# Patient Record
Sex: Female | Born: 1945 | Race: White | Hispanic: No | Marital: Married | Smoking: Never smoker
Health system: Southern US, Community
[De-identification: ages and names within clinical notes are randomized; demographics above are authoritative.]

## PROBLEM LIST (undated history)

## (undated) DIAGNOSIS — I10 Essential (primary) hypertension: Secondary | ICD-10-CM

## (undated) DIAGNOSIS — C801 Malignant (primary) neoplasm, unspecified: Secondary | ICD-10-CM

## (undated) DIAGNOSIS — N390 Urinary tract infection, site not specified: Secondary | ICD-10-CM

## (undated) DIAGNOSIS — K5190 Ulcerative colitis, unspecified, without complications: Secondary | ICD-10-CM

## (undated) DIAGNOSIS — F329 Major depressive disorder, single episode, unspecified: Secondary | ICD-10-CM

## (undated) DIAGNOSIS — N9089 Other specified noninflammatory disorders of vulva and perineum: Secondary | ICD-10-CM

## (undated) DIAGNOSIS — B977 Papillomavirus as the cause of diseases classified elsewhere: Secondary | ICD-10-CM

## (undated) DIAGNOSIS — F32A Depression, unspecified: Secondary | ICD-10-CM

## (undated) DIAGNOSIS — F419 Anxiety disorder, unspecified: Secondary | ICD-10-CM

## (undated) DIAGNOSIS — K219 Gastro-esophageal reflux disease without esophagitis: Secondary | ICD-10-CM

## (undated) DIAGNOSIS — A0472 Enterocolitis due to Clostridium difficile, not specified as recurrent: Secondary | ICD-10-CM

## (undated) DIAGNOSIS — E785 Hyperlipidemia, unspecified: Secondary | ICD-10-CM

## (undated) HISTORY — PX: BREAST BIOPSY: SHX20

## (undated) HISTORY — DX: Hyperlipidemia, unspecified: E78.5

## (undated) HISTORY — PX: EYE SURGERY: SHX253

## (undated) HISTORY — DX: Ulcerative colitis, unspecified, without complications: K51.90

## (undated) HISTORY — PX: AUGMENTATION MAMMAPLASTY: SUR837

## (undated) HISTORY — PX: BREAST EXCISIONAL BIOPSY: SUR124

## (undated) HISTORY — DX: Enterocolitis due to Clostridium difficile, not specified as recurrent: A04.72

## (undated) HISTORY — PX: BREAST SURGERY: SHX581

## (undated) HISTORY — PX: ANTERIOR CERVICAL DECOMP/DISCECTOMY FUSION: SHX1161

---

## 1990-10-02 HISTORY — PX: ABDOMINAL HYSTERECTOMY: SHX81

## 1999-03-08 ENCOUNTER — Ambulatory Visit (HOSPITAL_COMMUNITY): Admission: RE | Admit: 1999-03-08 | Discharge: 1999-03-08 | Payer: Self-pay | Admitting: Gastroenterology

## 2001-03-29 ENCOUNTER — Ambulatory Visit (HOSPITAL_COMMUNITY): Admission: RE | Admit: 2001-03-29 | Discharge: 2001-03-29 | Payer: Self-pay | Admitting: Family Medicine

## 2001-04-15 ENCOUNTER — Other Ambulatory Visit: Admission: RE | Admit: 2001-04-15 | Discharge: 2001-04-15 | Payer: Self-pay | Admitting: Gynecology

## 2002-01-06 ENCOUNTER — Encounter: Admission: RE | Admit: 2002-01-06 | Discharge: 2002-01-06 | Payer: Self-pay | Admitting: Family Medicine

## 2002-01-06 ENCOUNTER — Encounter: Payer: Self-pay | Admitting: Family Medicine

## 2002-04-21 ENCOUNTER — Other Ambulatory Visit: Admission: RE | Admit: 2002-04-21 | Discharge: 2002-04-21 | Payer: Self-pay | Admitting: Gynecology

## 2002-09-08 ENCOUNTER — Encounter: Payer: Self-pay | Admitting: Family Medicine

## 2002-09-08 ENCOUNTER — Ambulatory Visit (HOSPITAL_COMMUNITY): Admission: RE | Admit: 2002-09-08 | Discharge: 2002-09-08 | Payer: Self-pay | Admitting: Family Medicine

## 2002-10-09 ENCOUNTER — Encounter: Payer: Self-pay | Admitting: Neurology

## 2002-10-09 ENCOUNTER — Ambulatory Visit (HOSPITAL_COMMUNITY): Admission: RE | Admit: 2002-10-09 | Discharge: 2002-10-09 | Payer: Self-pay | Admitting: Neurology

## 2002-11-19 ENCOUNTER — Encounter: Payer: Self-pay | Admitting: Gynecology

## 2002-11-19 ENCOUNTER — Encounter: Admission: RE | Admit: 2002-11-19 | Discharge: 2002-11-19 | Payer: Self-pay | Admitting: Gynecology

## 2002-12-30 ENCOUNTER — Encounter: Admission: RE | Admit: 2002-12-30 | Discharge: 2002-12-30 | Payer: Self-pay | Admitting: Gynecology

## 2002-12-30 ENCOUNTER — Encounter: Payer: Self-pay | Admitting: Gynecology

## 2003-04-23 ENCOUNTER — Other Ambulatory Visit: Admission: RE | Admit: 2003-04-23 | Discharge: 2003-04-23 | Payer: Self-pay | Admitting: Gynecology

## 2003-05-20 ENCOUNTER — Encounter: Payer: Self-pay | Admitting: Gynecology

## 2003-05-20 ENCOUNTER — Encounter: Admission: RE | Admit: 2003-05-20 | Discharge: 2003-05-20 | Payer: Self-pay | Admitting: Gynecology

## 2004-01-22 ENCOUNTER — Encounter: Admission: RE | Admit: 2004-01-22 | Discharge: 2004-01-22 | Payer: Self-pay | Admitting: Family Medicine

## 2004-04-28 ENCOUNTER — Other Ambulatory Visit: Admission: RE | Admit: 2004-04-28 | Discharge: 2004-04-28 | Payer: Self-pay | Admitting: Gynecology

## 2004-05-20 ENCOUNTER — Encounter: Admission: RE | Admit: 2004-05-20 | Discharge: 2004-05-20 | Payer: Self-pay | Admitting: Gynecology

## 2005-05-02 ENCOUNTER — Other Ambulatory Visit: Admission: RE | Admit: 2005-05-02 | Discharge: 2005-05-02 | Payer: Self-pay | Admitting: Gynecology

## 2005-05-22 ENCOUNTER — Encounter: Admission: RE | Admit: 2005-05-22 | Discharge: 2005-05-22 | Payer: Self-pay | Admitting: Gynecology

## 2005-06-15 ENCOUNTER — Encounter: Admission: RE | Admit: 2005-06-15 | Discharge: 2005-06-15 | Payer: Self-pay | Admitting: Gynecology

## 2006-05-03 ENCOUNTER — Other Ambulatory Visit: Admission: RE | Admit: 2006-05-03 | Discharge: 2006-05-03 | Payer: Self-pay | Admitting: Gynecology

## 2006-05-31 ENCOUNTER — Encounter: Admission: RE | Admit: 2006-05-31 | Discharge: 2006-05-31 | Payer: Self-pay | Admitting: Gynecology

## 2007-05-09 ENCOUNTER — Other Ambulatory Visit: Admission: RE | Admit: 2007-05-09 | Discharge: 2007-05-09 | Payer: Self-pay | Admitting: Gynecology

## 2007-06-06 ENCOUNTER — Encounter: Admission: RE | Admit: 2007-06-06 | Discharge: 2007-06-06 | Payer: Self-pay | Admitting: Gynecology

## 2007-12-04 ENCOUNTER — Encounter: Admission: RE | Admit: 2007-12-04 | Discharge: 2007-12-04 | Payer: Self-pay | Admitting: Family Medicine

## 2008-06-09 ENCOUNTER — Encounter: Admission: RE | Admit: 2008-06-09 | Discharge: 2008-06-09 | Payer: Self-pay | Admitting: Gynecology

## 2008-12-15 ENCOUNTER — Encounter: Admission: RE | Admit: 2008-12-15 | Discharge: 2008-12-15 | Payer: Self-pay | Admitting: Family Medicine

## 2009-01-18 ENCOUNTER — Ambulatory Visit (HOSPITAL_COMMUNITY): Admission: RE | Admit: 2009-01-18 | Discharge: 2009-01-18 | Payer: Self-pay | Admitting: Neurosurgery

## 2009-03-11 ENCOUNTER — Encounter: Admission: RE | Admit: 2009-03-11 | Discharge: 2009-03-11 | Payer: Self-pay | Admitting: Neurological Surgery

## 2009-07-13 ENCOUNTER — Encounter: Admission: RE | Admit: 2009-07-13 | Discharge: 2009-07-13 | Payer: Self-pay | Admitting: Gynecology

## 2009-10-02 HISTORY — PX: BREAST ENHANCEMENT SURGERY: SHX7

## 2009-10-28 ENCOUNTER — Encounter: Admission: RE | Admit: 2009-10-28 | Discharge: 2009-10-28 | Payer: Self-pay | Admitting: Gynecology

## 2009-11-10 ENCOUNTER — Encounter: Admission: RE | Admit: 2009-11-10 | Discharge: 2009-11-10 | Payer: Self-pay | Admitting: Neurosurgery

## 2009-12-29 ENCOUNTER — Encounter: Admission: RE | Admit: 2009-12-29 | Discharge: 2009-12-29 | Payer: Self-pay | Admitting: Surgery

## 2010-04-28 ENCOUNTER — Encounter: Admission: RE | Admit: 2010-04-28 | Discharge: 2010-04-28 | Payer: Self-pay | Admitting: Neurosurgery

## 2010-12-08 ENCOUNTER — Other Ambulatory Visit: Payer: Self-pay | Admitting: Gynecology

## 2010-12-08 DIAGNOSIS — Z1231 Encounter for screening mammogram for malignant neoplasm of breast: Secondary | ICD-10-CM

## 2011-01-02 ENCOUNTER — Ambulatory Visit
Admission: RE | Admit: 2011-01-02 | Discharge: 2011-01-02 | Disposition: A | Discharge status: Home / Self Care | Payer: Self-pay | Source: Ambulatory Visit | Attending: Gynecology | Admitting: Gynecology

## 2011-01-02 DIAGNOSIS — Z1231 Encounter for screening mammogram for malignant neoplasm of breast: Secondary | ICD-10-CM

## 2011-01-03 ENCOUNTER — Other Ambulatory Visit: Payer: Self-pay | Admitting: Gynecology

## 2011-01-03 ENCOUNTER — Other Ambulatory Visit: Payer: Self-pay | Admitting: Neurosurgery

## 2011-01-03 DIAGNOSIS — M542 Cervicalgia: Secondary | ICD-10-CM

## 2011-01-03 DIAGNOSIS — R928 Other abnormal and inconclusive findings on diagnostic imaging of breast: Secondary | ICD-10-CM

## 2011-01-05 ENCOUNTER — Ambulatory Visit
Admission: RE | Admit: 2011-01-05 | Discharge: 2011-01-05 | Disposition: A | Discharge status: Home / Self Care | Payer: BC Managed Care – PPO | Source: Ambulatory Visit | Attending: Gynecology | Admitting: Gynecology

## 2011-01-05 DIAGNOSIS — R928 Other abnormal and inconclusive findings on diagnostic imaging of breast: Secondary | ICD-10-CM

## 2011-01-11 LAB — CBC
HCT: 40.9 % (ref 36.0–46.0)
MCHC: 33.6 g/dL (ref 30.0–36.0)
MCV: 87.6 fL (ref 78.0–100.0)
RBC: 4.67 MIL/uL (ref 3.87–5.11)
WBC: 6.9 10*3/uL (ref 4.0–10.5)

## 2011-01-11 LAB — DIFFERENTIAL
Basophils Relative: 1 % (ref 0–1)
Eosinophils Absolute: 0.2 10*3/uL (ref 0.0–0.7)
Eosinophils Relative: 2 % (ref 0–5)
Lymphs Abs: 1.6 10*3/uL (ref 0.7–4.0)
Monocytes Relative: 9 % (ref 3–12)

## 2011-01-11 LAB — BASIC METABOLIC PANEL
BUN: 9 mg/dL (ref 6–23)
CO2: 28 mEq/L (ref 19–32)
Chloride: 106 mEq/L (ref 96–112)
Creatinine, Ser: 0.9 mg/dL (ref 0.4–1.2)
GFR calc Af Amer: >60 mL/min (ref >60)
Potassium: 4.7 mEq/L (ref 3.5–5.1)

## 2011-01-18 ENCOUNTER — Ambulatory Visit
Admission: RE | Admit: 2011-01-18 | Discharge: 2011-01-18 | Disposition: A | Discharge status: Home / Self Care | Payer: BC Managed Care – PPO | Source: Ambulatory Visit | Attending: Neurosurgery | Admitting: Neurosurgery

## 2011-01-18 DIAGNOSIS — M542 Cervicalgia: Secondary | ICD-10-CM

## 2011-01-20 ENCOUNTER — Other Ambulatory Visit: Payer: Self-pay | Admitting: Surgery

## 2011-01-20 DIAGNOSIS — R921 Mammographic calcification found on diagnostic imaging of breast: Secondary | ICD-10-CM

## 2011-02-01 ENCOUNTER — Encounter (HOSPITAL_BASED_OUTPATIENT_CLINIC_OR_DEPARTMENT_OTHER)
Admission: RE | Admit: 2011-02-01 | Discharge: 2011-02-01 | Disposition: A | Discharge status: Home / Self Care | Payer: BC Managed Care – PPO | Source: Ambulatory Visit | Attending: Surgery | Admitting: Surgery

## 2011-02-03 ENCOUNTER — Ambulatory Visit
Admission: RE | Admit: 2011-02-03 | Discharge: 2011-02-03 | Disposition: A | Discharge status: Home / Self Care | Payer: BC Managed Care – PPO | Source: Ambulatory Visit | Attending: Surgery | Admitting: Surgery

## 2011-02-03 ENCOUNTER — Other Ambulatory Visit: Payer: Self-pay | Admitting: Surgery

## 2011-02-03 ENCOUNTER — Ambulatory Visit (HOSPITAL_BASED_OUTPATIENT_CLINIC_OR_DEPARTMENT_OTHER)
Admission: RE | Admit: 2011-02-03 | Discharge: 2011-02-03 | Disposition: A | Discharge status: Home / Self Care | Payer: BC Managed Care – PPO | Source: Ambulatory Visit | Attending: Surgery | Admitting: Surgery

## 2011-02-03 DIAGNOSIS — F411 Generalized anxiety disorder: Secondary | ICD-10-CM | POA: Insufficient documentation

## 2011-02-03 DIAGNOSIS — R921 Mammographic calcification found on diagnostic imaging of breast: Secondary | ICD-10-CM

## 2011-02-03 DIAGNOSIS — Z01812 Encounter for preprocedural laboratory examination: Secondary | ICD-10-CM | POA: Insufficient documentation

## 2011-02-03 DIAGNOSIS — R928 Other abnormal and inconclusive findings on diagnostic imaging of breast: Secondary | ICD-10-CM | POA: Insufficient documentation

## 2011-02-03 LAB — POCT HEMOGLOBIN-HEMACUE: Hemoglobin: 14.1 g/dL (ref 12.0–15.0)

## 2011-02-06 NOTE — Op Note (Signed)
[name], Allison Pierce                ACCOUNT NO.:  [id number]  MEDICAL RECORD NO.:  [id number]            PATIENT TYPE:  LOCATION:                                 FACILITY:  PHYSICIAN:  Currie Paris, M.D.   DATE OF BIRTH:  DATE OF PROCEDURE:  02/03/2011 DATE OF DISCHARGE:                              OPERATIVE REPORT   PREOPERATIVE DIAGNOSIS:  Left breast calcifications, upper outer quadrant.  POSTOPERATIVE DIAGNOSIS:  Left breast calcifications, upper outer quadrant.  PROCEDURE:  Needle-guided excision, left breast calcifications.  SURGEON:  Currie Paris, MD  ANESTHESIA:  General.  CLINICAL HISTORY:  This is a 65 year old lady who had some calcifications seen on recent mammogram in the upper outer quadrant. There is no definite palpable mass.  After discussion with the patient, we elected to proceed to a needle-guided excision.  She is not a candidate for a core biopsy because of the location and the fact of implants being very close.  The patient was seen in the holding area, and she had no further questions.  I initialed the left breast as the operative side and reviewed the wire localization films.  The patient was taken to the operating room, and after satisfactory general anesthesia had been obtained, the left breast was prepped and draped and the time-out was done.  The guidewire entered laterally and tracked medially.  I made a curvilinear incision directly over the guidewire tract.  The calcifications appeared to be about 2.5 cm from the guidewire entry site as the wire seemed to go about another 2 cm distal to the calcifications.  There seemed to be primarily somewhat superior to the guidewire.  Once incision made, I raised a little bit of the skin flap superiorly and inferiorly and then laterally, and I was able to grasp the guidewire tract with an Allis clamp and take a cylinder of tissue around the guidewire taking a little bit more  superiorly than inferiorly.  I basically got down to the muscle and took the fascia off so my deep margin included fascia.  I got beyond the tip of the guidewire, which is almost impacting on the muscle as there was only about a 1.5 cm of tissue between skin and muscle.  I oriented the specimen with some ink and sutures, and discussed mammogram, which showed the calcifications apparently in the specimen along the superior part.  I reinspected the wound and everything appeared to be dry.  Superiorly, there was almost no residual tissue until I got about 1.5 cm to 2 cm above the incision where there may have been a little bit of residual breast, but I basically had very thin skin flap down to muscle for most of the area above superiorly.  Once everything was dry, I went ahead and put some 3-0 Vicryls followed by 4-0 Monocryl subcuticular and Dermabond to close.  The patient tolerated the procedure well.  There were no complications. All counts were correct.     Currie Paris, M.D.     CJS/MEDQ  D:  02/03/2011  T:  02/04/2011  Job:  [id number]  cc:  Cam Hai, C.N.M. [facility]  Electronically Signed by Cyndia Bent M.D. on 02/06/2011 08:50:46 AM

## 2011-02-14 NOTE — Op Note (Signed)
Allison Pierce, Allison Pierce                ACCOUNT NO.:  [id number]   MEDICAL RECORD NO.:  [id number]          PATIENT TYPE:  OIB   LOCATION:  3526                         FACILITY:  [facility]   PHYSICIAN:  Kathaleen Maser. Pool, M.D.    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   PROCEDURE NAME:  C4-5 and C5-6 anterior cervical diskectomy and fusion  with allograft and plating.   SURGEON:  Kathaleen Maser. Pool, MD   ASSISTANT:  Donalee Citrin, MD   ANESTHESIA:  General orotracheal.   INDICATION:  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  Workup demonstrates evidence of spondylosis  with marked foraminal stenosis at C4-5 and C5-6.  The patient presents  now for two-level anterior cervical diskectomy and fusion with allograft  and plating in hopes of improving her symptoms.   The patient was brought to the operating room and placed on table in  supine position.  After adequate level of anesthesia was achieved, the  patient was placed supine with the neck slightly extended and held in  place with halter traction.  The patient's anterior cervical region was  prepped and draped sterilely.  A #10 blade was used to make a linear  skin incision overlying the C5 vertebral level.  This was carried down  sharply to the platysma.  Platysma was then divided vertically and  dissection proceeded on medial border of the sternomastoid muscle and  carotid sheath.  Trachea and esophagus were mobilized, tracked towards  the left.  Prevertebral fascia was stripped off the inner spinal column.  Longus colli muscle was elevated bilaterally using  electrocautery.  Deep  self-retaining tractor was placed.  Intraoperative fluoroscopy views in  C4-5 and C5-6 levels were confirmed.  Disk space at both levels then  incised with 15 blade in a rectangular fashion.  Wide disk space clean-  out was achieved using pituitary rongeurs, forward and backward Karlin  curettes, Kerrison rongeurs, and high-speed drill -speed drill.  Elements of the disk were removed down to the level of the posterior  annulus.  Starting first at C4-5, remaining aspects of annulus  osteophytes removed using high-speed drill down to the level of the  longitudinal ligament.  The posterior longitudinal ligament was then  elevated and resected in piecemeal fashion using Kerrison rongeurs.  The  underlying thecal sac was then identified.  Wide central decompression  was then performed undercutting the bodies of C4 and C5.  Decompression  was then proceeded out to the edges of the neural foramen.  Wide  anterior foraminotomies were then performed along the course of the  exiting nerve roots bilaterally.  At this point, a very thorough  decompression was achieved.  There was no injury to thecal sac or  nerve  roots.  The procedure was then repeated at C5-6 again without  complication.  Wound was then irrigated with antibiotic solution.  Gelfoam was placed topically.  Hemostasis found to be good.  A 6-mm  Cornerstone allograft was impacted into place at C4-5 and a 7-mm graft  was placed at C5-6.  A 42-mm Atlantis anterior cervical plate was then  placed over the C4, C5, and C6 levels.  This attached under fluoroscopic  guidance using 13-mm variable angle screws to each at all 3 levels.  All  screws given final tightening and found to be solidly within bone.  Locking screws engaged at all three levels.  Final images revealed good  position of the bone grafts, hardware at proper operative level, with  normal alignment of spine.  Wound was then inspected.  Hemostasis found to  be good.  The wound was  irrigated one final time then closed in a typical fashion.  Steri-Strips  and sterile dressings were applied.  There were no complications.  The  patient tolerated the procedure well, and she returns to the recovery  room postoperatively.           ______________________________  Kathaleen Maser Pool, M.D.     HAP/MEDQ  D:  01/18/2009  T:  01/19/2009  Job:  [id number]

## 2011-03-20 ENCOUNTER — Encounter (INDEPENDENT_AMBULATORY_CARE_PROVIDER_SITE_OTHER): Payer: Self-pay | Admitting: Surgery

## 2011-05-30 ENCOUNTER — Other Ambulatory Visit: Payer: Self-pay | Admitting: Gynecology

## 2011-10-26 ENCOUNTER — Other Ambulatory Visit: Payer: Self-pay | Admitting: Gynecology

## 2011-12-19 ENCOUNTER — Other Ambulatory Visit: Payer: Self-pay | Admitting: Gynecology

## 2011-12-19 DIAGNOSIS — Z1231 Encounter for screening mammogram for malignant neoplasm of breast: Secondary | ICD-10-CM

## 2012-01-03 ENCOUNTER — Ambulatory Visit
Admission: RE | Admit: 2012-01-03 | Discharge: 2012-01-03 | Disposition: A | Discharge status: Home / Self Care | Payer: BC Managed Care – PPO | Source: Ambulatory Visit | Attending: Gynecology | Admitting: Gynecology

## 2012-01-03 DIAGNOSIS — Z1231 Encounter for screening mammogram for malignant neoplasm of breast: Secondary | ICD-10-CM

## 2012-05-30 ENCOUNTER — Other Ambulatory Visit: Payer: Self-pay | Admitting: Gynecology

## 2012-09-05 ENCOUNTER — Encounter (HOSPITAL_BASED_OUTPATIENT_CLINIC_OR_DEPARTMENT_OTHER): Payer: Self-pay | Admitting: *Deleted

## 2012-09-05 NOTE — Progress Notes (Signed)
NPO AFTER MN. ARRIVE AT 1610. NEEDS ISTAT AND EKG. WILL TAKE WELLBUTRIN AND LIPITOR AM OF SURG W/ SIP OF WATER.

## 2012-09-10 ENCOUNTER — Ambulatory Visit (HOSPITAL_BASED_OUTPATIENT_CLINIC_OR_DEPARTMENT_OTHER)
Admission: RE | Admit: 2012-09-10 | Discharge: 2012-09-10 | Disposition: A | Discharge status: Home / Self Care | Payer: BC Managed Care – PPO | Source: Ambulatory Visit | Attending: Gynecology | Admitting: Gynecology

## 2012-09-10 ENCOUNTER — Encounter (HOSPITAL_BASED_OUTPATIENT_CLINIC_OR_DEPARTMENT_OTHER): Payer: Self-pay | Admitting: Anesthesiology

## 2012-09-10 ENCOUNTER — Encounter (HOSPITAL_BASED_OUTPATIENT_CLINIC_OR_DEPARTMENT_OTHER): Payer: Self-pay | Admitting: *Deleted

## 2012-09-10 ENCOUNTER — Other Ambulatory Visit: Payer: Self-pay

## 2012-09-10 ENCOUNTER — Encounter (HOSPITAL_BASED_OUTPATIENT_CLINIC_OR_DEPARTMENT_OTHER)
Admission: RE | Disposition: A | Discharge status: Home / Self Care | Payer: Self-pay | Source: Ambulatory Visit | Attending: Gynecology

## 2012-09-10 ENCOUNTER — Ambulatory Visit (HOSPITAL_BASED_OUTPATIENT_CLINIC_OR_DEPARTMENT_OTHER): Payer: BC Managed Care – PPO | Admitting: Anesthesiology

## 2012-09-10 DIAGNOSIS — A630 Anogenital (venereal) warts: Secondary | ICD-10-CM | POA: Insufficient documentation

## 2012-09-10 DIAGNOSIS — I10 Essential (primary) hypertension: Secondary | ICD-10-CM | POA: Insufficient documentation

## 2012-09-10 DIAGNOSIS — L819 Disorder of pigmentation, unspecified: Secondary | ICD-10-CM | POA: Insufficient documentation

## 2012-09-10 HISTORY — DX: Essential (primary) hypertension: I10

## 2012-09-10 HISTORY — DX: Anxiety disorder, unspecified: F41.9

## 2012-09-10 HISTORY — DX: Major depressive disorder, single episode, unspecified: F32.9

## 2012-09-10 HISTORY — DX: Gastro-esophageal reflux disease without esophagitis: K21.9

## 2012-09-10 HISTORY — DX: Depression, unspecified: F32.A

## 2012-09-10 HISTORY — PX: VULVAR LESION REMOVAL: SHX5391

## 2012-09-10 HISTORY — DX: Other specified noninflammatory disorders of vulva and perineum: N90.89

## 2012-09-10 LAB — POCT I-STAT 4, (NA,K, GLUC, HGB,HCT)
HCT: 41 % (ref 36.0–46.0)
Potassium: 3.7 mEq/L (ref 3.5–5.1)
Sodium: 143 mEq/L (ref 135–145)

## 2012-09-10 SURGERY — VULVAR LESION
Anesthesia: Monitor Anesthesia Care | Site: Vulva | Wound class: Clean Contaminated

## 2012-09-10 MED ORDER — LACTATED RINGERS IV SOLN
INTRAVENOUS | Status: DC
  Administered 2012-09-10 (×2): via INTRAVENOUS
  Filled 2012-09-10: qty 1000

## 2012-09-10 MED ORDER — LACTATED RINGERS IV SOLN
INTRAVENOUS | Status: DC
  Administered 2012-09-10: 08:00:00 via INTRAVENOUS
  Filled 2012-09-10: qty 1000

## 2012-09-10 MED ORDER — LIDOCAINE HCL 1 % IJ SOLN
INTRAMUSCULAR | Status: DC | PRN
  Administered 2012-09-10: 8 mL

## 2012-09-10 MED ORDER — MEPERIDINE HCL 25 MG/ML IJ SOLN
6.2500 mg | INTRAMUSCULAR | Status: DC | PRN
  Filled 2012-09-10: qty 1

## 2012-09-10 MED ORDER — PROPOFOL 10 MG/ML IV EMUL
INTRAVENOUS | Status: DC | PRN
  Administered 2012-09-10: 75 ug/kg/min via INTRAVENOUS

## 2012-09-10 MED ORDER — MIDAZOLAM HCL 5 MG/5ML IJ SOLN
INTRAMUSCULAR | Status: DC | PRN
  Administered 2012-09-10: 2 mg via INTRAVENOUS

## 2012-09-10 MED ORDER — FENTANYL CITRATE 0.05 MG/ML IJ SOLN
25.0000 ug | INTRAMUSCULAR | Status: DC | PRN
  Filled 2012-09-10: qty 1

## 2012-09-10 MED ORDER — PROMETHAZINE HCL 25 MG/ML IJ SOLN
6.2500 mg | INTRAMUSCULAR | Status: DC | PRN
  Filled 2012-09-10: qty 1

## 2012-09-10 MED ORDER — OXYCODONE-ACETAMINOPHEN 5-325 MG PO TABS
1.0000 | ORAL_TABLET | Freq: Once | ORAL | Status: AC
  Administered 2012-09-10: 1 via ORAL
  Filled 2012-09-10: qty 1

## 2012-09-10 MED ORDER — FENTANYL CITRATE 0.05 MG/ML IJ SOLN
INTRAMUSCULAR | Status: DC | PRN
  Administered 2012-09-10 (×2): 50 ug via INTRAVENOUS

## 2012-09-10 SURGICAL SUPPLY — 42 items
BLADE SURG 15 STRL LF DISP TIS (BLADE) ×1 IMPLANT
BLADE SURG 15 STRL SS (BLADE) ×2
CANISTER SUCTION 1200CC (MISCELLANEOUS) IMPLANT
CANISTER SUCTION 2500CC (MISCELLANEOUS) ×1 IMPLANT
CLOTH BEACON ORANGE TIMEOUT ST (SAFETY) ×2 IMPLANT
COVER TABLE BACK 60X90 (DRAPES) ×2 IMPLANT
DRAPE LG THREE QUARTER DISP (DRAPES) ×2 IMPLANT
DRAPE UNDERBUTTOCKS STRL (DRAPE) ×2 IMPLANT
ELECT NDL TIP 2.8 STRL (NEEDLE) IMPLANT
ELECT NEEDLE TIP 2.8 STRL (NEEDLE) IMPLANT
ELECT REM PT RETURN 9FT ADLT (ELECTROSURGICAL) ×2
ELECTRODE REM PT RTRN 9FT ADLT (ELECTROSURGICAL) ×1 IMPLANT
GLOVE BIO SURGEON STRL SZ8 (GLOVE) ×4 IMPLANT
GOWN PREVENTION PLUS LG XLONG (DISPOSABLE) ×4 IMPLANT
GOWN STRL REIN XL XLG (GOWN DISPOSABLE) IMPLANT
LEGGING LITHOTOMY PAIR STRL (DRAPES) ×2 IMPLANT
NDL HYPO 25X1 1.5 SAFETY (NEEDLE) IMPLANT
NEEDLE HYPO 25X1 1.5 SAFETY (NEEDLE) ×2 IMPLANT
NEEDLE HYPO 25X5/8 SAFETYGLIDE (NEEDLE) IMPLANT
NS IRRIG 500ML POUR BTL (IV SOLUTION) IMPLANT
PACK BASIN DAY SURGERY FS (CUSTOM PROCEDURE TRAY) ×2 IMPLANT
PAD OB MATERNITY 4.3X12.25 (PERSONAL CARE ITEMS) ×2 IMPLANT
PAD PREP 24X48 CUFFED NSTRL (MISCELLANEOUS) ×2 IMPLANT
PENCIL BUTTON HOLSTER BLD 10FT (ELECTRODE) ×2 IMPLANT
STRIP CLOSURE SKIN 1/4X4 (GAUZE/BANDAGES/DRESSINGS) IMPLANT
SUT MON AB 5-0 P3 18 (SUTURE) IMPLANT
SUT VIC AB 3-0 PS2 18 (SUTURE)
SUT VIC AB 3-0 PS2 18XBRD (SUTURE) IMPLANT
SUT VIC AB 3-0 SH 27 (SUTURE)
SUT VIC AB 3-0 SH 27X BRD (SUTURE) IMPLANT
SUT VIC AB 4-0 SH 27 (SUTURE)
SUT VIC AB 4-0 SH 27XANBCTRL (SUTURE) IMPLANT
SUT VIC AB 5-0 PS2 18 (SUTURE) ×2 IMPLANT
SUT VICRYL 6 0 UNDY PS 6 (SUTURE) IMPLANT
SWAB CULTURE LIQ STUART DBL (MISCELLANEOUS) IMPLANT
SYR BULB IRRIGATION 50ML (SYRINGE) IMPLANT
TOWEL OR 17X24 6PK STRL BLUE (TOWEL DISPOSABLE) ×4 IMPLANT
TRAY DSU PREP LF (CUSTOM PROCEDURE TRAY) ×2 IMPLANT
TUBE ANAEROBIC SPECIMEN COL (MISCELLANEOUS) IMPLANT
TUBE CONNECTING 12X1/4 (SUCTIONS) ×1 IMPLANT
WATER STERILE IRR 500ML POUR (IV SOLUTION) ×2 IMPLANT
YANKAUER SUCT BULB TIP NO VENT (SUCTIONS) ×1 IMPLANT

## 2012-09-10 NOTE — Transfer of Care (Signed)
Immediate Anesthesia Transfer of Care Note  Patient: Allison Pierce  Procedure(s) Performed: Procedure(s) (LRB): VULVAR LESION (N/A)  Patient Location: PACU  Anesthesia Type: MAC  Level of Consciousness: awake, alert , oriented and patient cooperative  Airway & Oxygen Therapy: Patient Spontanous Breathing and Patient connected to face mask oxygen  Post-op Assessment: Report given to PACU RN and Post -op Vital signs reviewed and stable  Post vital signs: Reviewed and stable  Complications: No apparent anesthesia complications

## 2012-09-10 NOTE — Anesthesia Postprocedure Evaluation (Signed)
  Anesthesia Post-op Note  Patient: Allison Pierce  Procedure(s) Performed: Procedure(s) (LRB): VULVAR LESION (N/A)  Patient Location: PACU  Anesthesia Type: MAC  Level of Consciousness: awake and alert   Airway and Oxygen Therapy: Patient Spontanous Breathing  Post-op Pain: mild  Post-op Assessment: Post-op Vital signs reviewed, Patient's Cardiovascular Status Stable, Respiratory Function Stable, Patent Airway and No signs of Nausea or vomiting  Last Vitals:  Filed Vitals:   09/10/12 0842  BP: 123/69  Pulse: 79  Temp: 36.1 C  Resp: 14    Post-op Vital Signs: stable   Complications: No apparent anesthesia complications

## 2012-09-10 NOTE — Anesthesia Preprocedure Evaluation (Addendum)
Anesthesia Evaluation  Patient identified by MRN, date of birth, ID band Patient awake    Reviewed: Allergy & Precautions, H&P , NPO status , Patient's Chart, lab work & pertinent test results  Airway Mallampati: II TM Distance: >3 FB Neck ROM: Full    Dental No notable dental hx.    Pulmonary neg pulmonary ROS,  breath sounds clear to auscultation  Pulmonary exam normal       Cardiovascular hypertension, Pt. on medications negative cardio ROS  Rhythm:Regular Rate:Normal     Neuro/Psych PSYCHIATRIC DISORDERS Anxiety Depression negative neurological ROS  negative psych ROS   GI/Hepatic negative GI ROS, Neg liver ROS,   Endo/Other  negative endocrine ROS  Renal/GU negative Renal ROS  negative genitourinary   Musculoskeletal negative musculoskeletal ROS (+)   Abdominal   Peds negative pediatric ROS (+)  Hematology negative hematology ROS (+)   Anesthesia Other Findings   Reproductive/Obstetrics negative OB ROS                          Anesthesia Physical Anesthesia Plan  ASA: II  Anesthesia Plan: MAC   Post-op Pain Management:    Induction: Intravenous  Airway Management Planned: Simple Face Mask  Additional Equipment:   Intra-op Plan:   Post-operative Plan:   Informed Consent: I have reviewed the patients History and Physical, chart, labs and discussed the procedure including the risks, benefits and alternatives for the proposed anesthesia with the patient or authorized representative who has indicated his/her understanding and acceptance.   Dental advisory given  Plan Discussed with: CRNA  Anesthesia Plan Comments:         Anesthesia Quick Evaluation

## 2012-09-11 ENCOUNTER — Encounter (HOSPITAL_BASED_OUTPATIENT_CLINIC_OR_DEPARTMENT_OTHER): Payer: Self-pay | Admitting: Gynecology

## 2012-09-11 NOTE — Op Note (Signed)
NAMEMIRIYA, CLOER NO.:  [id number]  MEDICAL RECORD NO.:  [id number]  LOCATION:                               FACILITY:  [facility]  PHYSICIAN:  Gretta Cool, M.D. 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  PROCEDURE:  Wide excision of high-grade VIN, close proximity to anus on anoderm.  SURGEON:  Gretta Cool, MD  ANESTHESIA:  IV sedation and local infiltration.  DESCRIPTION OF PROCEDURE:  After adequate prep and local infiltration of 1% Xylocaine, the lesion was outlined with a marking pen and then excised approximately a cm around the lesion in all directions.  Once bleeding was well controlled by cautery, the lesion was approximated by deep sutures of #3-0 Vicryl, then closed by subcuticular closure of 4-0 Vicryl.  At this point, there was no significant bleeding.  The lesion was well approximated.  Lidocaine ointment was applied.  The patient returned to the recovery room in excellent condition.          ______________________________ Gretta Cool, M.D.     CWL/MEDQ  D:  09/10/2012  T:  09/10/2012  Job:  [id number]

## 2012-10-14 ENCOUNTER — Other Ambulatory Visit: Payer: Self-pay | Admitting: Family Medicine

## 2012-10-14 DIAGNOSIS — R42 Dizziness and giddiness: Secondary | ICD-10-CM

## 2012-10-14 DIAGNOSIS — R270 Ataxia, unspecified: Secondary | ICD-10-CM

## 2012-10-22 ENCOUNTER — Other Ambulatory Visit: Payer: BC Managed Care – PPO

## 2012-11-11 ENCOUNTER — Ambulatory Visit
Admission: RE | Admit: 2012-11-11 | Discharge: 2012-11-11 | Disposition: A | Discharge status: Home / Self Care | Payer: BC Managed Care – PPO | Source: Ambulatory Visit | Attending: Family Medicine | Admitting: Family Medicine

## 2012-11-11 VITALS — BP 152/78 | HR 67

## 2012-11-11 DIAGNOSIS — R270 Ataxia, unspecified: Secondary | ICD-10-CM

## 2012-11-11 DIAGNOSIS — R42 Dizziness and giddiness: Secondary | ICD-10-CM

## 2012-11-11 MED ORDER — DIPHENHYDRAMINE HCL 50 MG/ML IJ SOLN
50.0000 mg | Freq: Once | INTRAMUSCULAR | Status: AC
  Administered 2012-11-11: 50 mg via INTRAVENOUS

## 2012-11-11 MED ORDER — GADOBENATE DIMEGLUMINE 529 MG/ML IV SOLN
15.0000 mL | Freq: Once | INTRAVENOUS | Status: AC | PRN
  Administered 2012-11-11: 15 mL via INTRAVENOUS

## 2012-11-11 NOTE — Progress Notes (Signed)
Pt seen by Dr. Bonnielee Haff and IV start and benadryl 50 mg IV. Pt is very red and itching "feels like my skin is burning."

## 2012-11-11 NOTE — Progress Notes (Signed)
Pt's husband is here to take her home. All redness is gone and pt denies SOB at this time.

## 2012-11-13 ENCOUNTER — Other Ambulatory Visit: Payer: BC Managed Care – PPO

## 2012-11-16 ENCOUNTER — Other Ambulatory Visit: Payer: Self-pay

## 2013-01-15 ENCOUNTER — Ambulatory Visit
Admission: RE | Admit: 2013-01-15 | Discharge: 2013-01-15 | Disposition: A | Discharge status: Home / Self Care | Payer: Medicare Other | Source: Ambulatory Visit | Attending: Family Medicine | Admitting: Family Medicine

## 2013-01-15 ENCOUNTER — Other Ambulatory Visit: Payer: Self-pay | Admitting: Family Medicine

## 2013-01-15 DIAGNOSIS — R05 Cough: Secondary | ICD-10-CM | POA: Diagnosis not present

## 2013-01-15 DIAGNOSIS — J069 Acute upper respiratory infection, unspecified: Secondary | ICD-10-CM | POA: Diagnosis not present

## 2013-01-24 DIAGNOSIS — R05 Cough: Secondary | ICD-10-CM | POA: Diagnosis not present

## 2013-01-24 DIAGNOSIS — R509 Fever, unspecified: Secondary | ICD-10-CM | POA: Diagnosis not present

## 2013-01-24 DIAGNOSIS — E785 Hyperlipidemia, unspecified: Secondary | ICD-10-CM | POA: Insufficient documentation

## 2013-01-24 DIAGNOSIS — J40 Bronchitis, not specified as acute or chronic: Secondary | ICD-10-CM | POA: Diagnosis not present

## 2013-01-24 DIAGNOSIS — F3289 Other specified depressive episodes: Secondary | ICD-10-CM | POA: Insufficient documentation

## 2013-01-24 DIAGNOSIS — R0602 Shortness of breath: Secondary | ICD-10-CM | POA: Diagnosis not present

## 2013-01-24 DIAGNOSIS — E782 Mixed hyperlipidemia: Secondary | ICD-10-CM | POA: Insufficient documentation

## 2013-02-19 DIAGNOSIS — M25579 Pain in unspecified ankle and joints of unspecified foot: Secondary | ICD-10-CM | POA: Diagnosis not present

## 2013-02-19 DIAGNOSIS — S93409A Sprain of unspecified ligament of unspecified ankle, initial encounter: Secondary | ICD-10-CM | POA: Diagnosis not present

## 2013-03-25 DIAGNOSIS — Z87412 Personal history of vulvar dysplasia: Secondary | ICD-10-CM | POA: Diagnosis not present

## 2013-03-25 DIAGNOSIS — Z79890 Hormone replacement therapy (postmenopausal): Secondary | ICD-10-CM | POA: Diagnosis not present

## 2013-03-25 DIAGNOSIS — Z049 Encounter for examination and observation for unspecified reason: Secondary | ICD-10-CM | POA: Diagnosis not present

## 2013-03-25 DIAGNOSIS — Z124 Encounter for screening for malignant neoplasm of cervix: Secondary | ICD-10-CM | POA: Diagnosis not present

## 2013-03-25 DIAGNOSIS — D4959 Neoplasm of unspecified behavior of other genitourinary organ: Secondary | ICD-10-CM | POA: Diagnosis not present

## 2013-03-25 DIAGNOSIS — N951 Menopausal and female climacteric states: Secondary | ICD-10-CM | POA: Diagnosis not present

## 2013-03-25 DIAGNOSIS — A630 Anogenital (venereal) warts: Secondary | ICD-10-CM | POA: Diagnosis not present

## 2013-04-07 DIAGNOSIS — R21 Rash and other nonspecific skin eruption: Secondary | ICD-10-CM | POA: Diagnosis not present

## 2013-04-07 DIAGNOSIS — B009 Herpesviral infection, unspecified: Secondary | ICD-10-CM | POA: Diagnosis not present

## 2013-04-07 DIAGNOSIS — L0291 Cutaneous abscess, unspecified: Secondary | ICD-10-CM | POA: Diagnosis not present

## 2013-04-23 ENCOUNTER — Other Ambulatory Visit: Payer: Self-pay

## 2013-04-23 DIAGNOSIS — Z1231 Encounter for screening mammogram for malignant neoplasm of breast: Secondary | ICD-10-CM

## 2013-05-07 ENCOUNTER — Other Ambulatory Visit: Payer: Self-pay

## 2013-05-13 ENCOUNTER — Ambulatory Visit
Admission: RE | Admit: 2013-05-13 | Discharge: 2013-05-13 | Disposition: A | Discharge status: Home / Self Care | Payer: Medicare Other | Source: Ambulatory Visit

## 2013-05-13 DIAGNOSIS — Z1231 Encounter for screening mammogram for malignant neoplasm of breast: Secondary | ICD-10-CM | POA: Diagnosis not present

## 2013-06-25 DIAGNOSIS — F329 Major depressive disorder, single episode, unspecified: Secondary | ICD-10-CM | POA: Diagnosis not present

## 2013-06-25 DIAGNOSIS — E782 Mixed hyperlipidemia: Secondary | ICD-10-CM | POA: Diagnosis not present

## 2013-06-25 DIAGNOSIS — Z9181 History of falling: Secondary | ICD-10-CM | POA: Diagnosis not present

## 2013-06-25 DIAGNOSIS — Z23 Encounter for immunization: Secondary | ICD-10-CM | POA: Diagnosis not present

## 2013-06-25 DIAGNOSIS — H9190 Unspecified hearing loss, unspecified ear: Secondary | ICD-10-CM | POA: Diagnosis not present

## 2013-06-25 DIAGNOSIS — G479 Sleep disorder, unspecified: Secondary | ICD-10-CM | POA: Diagnosis not present

## 2013-06-25 DIAGNOSIS — Z0000 Encounter for general adult medical examination without abnormal findings: Secondary | ICD-10-CM | POA: Diagnosis not present

## 2013-06-25 DIAGNOSIS — I10 Essential (primary) hypertension: Secondary | ICD-10-CM | POA: Diagnosis not present

## 2013-07-01 DIAGNOSIS — H905 Unspecified sensorineural hearing loss: Secondary | ICD-10-CM | POA: Diagnosis not present

## 2013-07-22 DIAGNOSIS — R269 Unspecified abnormalities of gait and mobility: Secondary | ICD-10-CM | POA: Diagnosis not present

## 2013-07-23 DIAGNOSIS — Z23 Encounter for immunization: Secondary | ICD-10-CM | POA: Diagnosis not present

## 2013-07-23 DIAGNOSIS — E8941 Symptomatic postprocedural ovarian failure: Secondary | ICD-10-CM | POA: Diagnosis not present

## 2013-08-07 ENCOUNTER — Other Ambulatory Visit: Payer: Self-pay

## 2013-10-08 DIAGNOSIS — B9789 Other viral agents as the cause of diseases classified elsewhere: Secondary | ICD-10-CM | POA: Diagnosis not present

## 2013-10-08 DIAGNOSIS — R05 Cough: Secondary | ICD-10-CM | POA: Diagnosis not present

## 2013-10-08 DIAGNOSIS — K219 Gastro-esophageal reflux disease without esophagitis: Secondary | ICD-10-CM | POA: Diagnosis not present

## 2013-10-08 DIAGNOSIS — R059 Cough, unspecified: Secondary | ICD-10-CM | POA: Diagnosis not present

## 2013-11-19 DIAGNOSIS — R05 Cough: Secondary | ICD-10-CM | POA: Diagnosis not present

## 2013-11-19 DIAGNOSIS — J329 Chronic sinusitis, unspecified: Secondary | ICD-10-CM | POA: Diagnosis not present

## 2013-11-19 DIAGNOSIS — R059 Cough, unspecified: Secondary | ICD-10-CM | POA: Diagnosis not present

## 2014-01-28 DIAGNOSIS — F329 Major depressive disorder, single episode, unspecified: Secondary | ICD-10-CM | POA: Diagnosis not present

## 2014-01-28 DIAGNOSIS — M542 Cervicalgia: Secondary | ICD-10-CM | POA: Diagnosis not present

## 2014-01-28 DIAGNOSIS — M25559 Pain in unspecified hip: Secondary | ICD-10-CM | POA: Diagnosis not present

## 2014-03-04 DIAGNOSIS — F329 Major depressive disorder, single episode, unspecified: Secondary | ICD-10-CM | POA: Diagnosis not present

## 2014-03-04 DIAGNOSIS — E782 Mixed hyperlipidemia: Secondary | ICD-10-CM | POA: Diagnosis not present

## 2014-03-04 DIAGNOSIS — B351 Tinea unguium: Secondary | ICD-10-CM | POA: Diagnosis not present

## 2014-03-04 DIAGNOSIS — G479 Sleep disorder, unspecified: Secondary | ICD-10-CM | POA: Diagnosis not present

## 2014-03-04 DIAGNOSIS — M542 Cervicalgia: Secondary | ICD-10-CM | POA: Diagnosis not present

## 2014-03-10 DIAGNOSIS — R109 Unspecified abdominal pain: Secondary | ICD-10-CM | POA: Diagnosis not present

## 2014-04-23 DIAGNOSIS — R197 Diarrhea, unspecified: Secondary | ICD-10-CM | POA: Diagnosis not present

## 2014-04-28 DIAGNOSIS — L259 Unspecified contact dermatitis, unspecified cause: Secondary | ICD-10-CM | POA: Diagnosis not present

## 2014-05-07 ENCOUNTER — Other Ambulatory Visit: Payer: Self-pay

## 2014-05-07 DIAGNOSIS — Z1231 Encounter for screening mammogram for malignant neoplasm of breast: Secondary | ICD-10-CM

## 2014-05-19 DIAGNOSIS — R197 Diarrhea, unspecified: Secondary | ICD-10-CM | POA: Diagnosis not present

## 2014-05-19 DIAGNOSIS — Z86010 Personal history of colonic polyps: Secondary | ICD-10-CM | POA: Diagnosis not present

## 2014-05-25 ENCOUNTER — Ambulatory Visit
Admission: RE | Admit: 2014-05-25 | Discharge: 2014-05-25 | Disposition: A | Discharge status: Home / Self Care | Payer: Medicare Other | Source: Ambulatory Visit

## 2014-05-25 DIAGNOSIS — Z1231 Encounter for screening mammogram for malignant neoplasm of breast: Secondary | ICD-10-CM | POA: Diagnosis not present

## 2014-05-27 ENCOUNTER — Other Ambulatory Visit: Payer: Self-pay | Admitting: Gynecology

## 2014-05-27 DIAGNOSIS — R928 Other abnormal and inconclusive findings on diagnostic imaging of breast: Secondary | ICD-10-CM

## 2014-06-01 ENCOUNTER — Ambulatory Visit
Admission: RE | Admit: 2014-06-01 | Discharge: 2014-06-01 | Disposition: A | Discharge status: Home / Self Care | Payer: Medicare Other | Source: Ambulatory Visit | Attending: Gynecology | Admitting: Gynecology

## 2014-06-01 ENCOUNTER — Other Ambulatory Visit: Payer: Self-pay | Admitting: Gynecology

## 2014-06-01 DIAGNOSIS — R928 Other abnormal and inconclusive findings on diagnostic imaging of breast: Secondary | ICD-10-CM

## 2014-06-03 ENCOUNTER — Other Ambulatory Visit: Payer: Self-pay | Admitting: Gastroenterology

## 2014-06-03 DIAGNOSIS — R197 Diarrhea, unspecified: Secondary | ICD-10-CM | POA: Diagnosis not present

## 2014-06-03 DIAGNOSIS — D126 Benign neoplasm of colon, unspecified: Secondary | ICD-10-CM | POA: Diagnosis not present

## 2014-06-03 DIAGNOSIS — K5730 Diverticulosis of large intestine without perforation or abscess without bleeding: Secondary | ICD-10-CM | POA: Diagnosis not present

## 2014-06-09 ENCOUNTER — Ambulatory Visit
Admission: RE | Admit: 2014-06-09 | Discharge: 2014-06-09 | Disposition: A | Discharge status: Home / Self Care | Payer: Medicare Other | Source: Ambulatory Visit | Attending: Gynecology | Admitting: Gynecology

## 2014-06-09 DIAGNOSIS — R928 Other abnormal and inconclusive findings on diagnostic imaging of breast: Secondary | ICD-10-CM

## 2014-06-09 DIAGNOSIS — D249 Benign neoplasm of unspecified breast: Secondary | ICD-10-CM | POA: Diagnosis not present

## 2014-06-09 DIAGNOSIS — N6019 Diffuse cystic mastopathy of unspecified breast: Secondary | ICD-10-CM | POA: Diagnosis not present

## 2014-06-29 DIAGNOSIS — IMO0002 Reserved for concepts with insufficient information to code with codable children: Secondary | ICD-10-CM | POA: Diagnosis not present

## 2014-06-29 DIAGNOSIS — H2510 Age-related nuclear cataract, unspecified eye: Secondary | ICD-10-CM | POA: Diagnosis not present

## 2014-07-01 DIAGNOSIS — F329 Major depressive disorder, single episode, unspecified: Secondary | ICD-10-CM | POA: Diagnosis not present

## 2014-07-01 DIAGNOSIS — E782 Mixed hyperlipidemia: Secondary | ICD-10-CM | POA: Diagnosis not present

## 2014-07-01 DIAGNOSIS — J309 Allergic rhinitis, unspecified: Secondary | ICD-10-CM | POA: Diagnosis not present

## 2014-07-01 DIAGNOSIS — Z0000 Encounter for general adult medical examination without abnormal findings: Secondary | ICD-10-CM | POA: Diagnosis not present

## 2014-07-01 DIAGNOSIS — K1370 Unspecified lesions of oral mucosa: Secondary | ICD-10-CM | POA: Diagnosis not present

## 2014-07-01 DIAGNOSIS — Z23 Encounter for immunization: Secondary | ICD-10-CM | POA: Diagnosis not present

## 2014-07-01 DIAGNOSIS — G479 Sleep disorder, unspecified: Secondary | ICD-10-CM | POA: Diagnosis not present

## 2014-07-01 DIAGNOSIS — I10 Essential (primary) hypertension: Secondary | ICD-10-CM | POA: Diagnosis not present

## 2014-07-08 DIAGNOSIS — H25041 Posterior subcapsular polar age-related cataract, right eye: Secondary | ICD-10-CM | POA: Diagnosis not present

## 2014-07-08 DIAGNOSIS — H2510 Age-related nuclear cataract, unspecified eye: Secondary | ICD-10-CM | POA: Diagnosis not present

## 2014-07-15 DIAGNOSIS — H2511 Age-related nuclear cataract, right eye: Secondary | ICD-10-CM | POA: Diagnosis not present

## 2014-07-15 DIAGNOSIS — H25041 Posterior subcapsular polar age-related cataract, right eye: Secondary | ICD-10-CM | POA: Diagnosis not present

## 2014-07-15 DIAGNOSIS — H25042 Posterior subcapsular polar age-related cataract, left eye: Secondary | ICD-10-CM | POA: Diagnosis not present

## 2014-07-15 DIAGNOSIS — H2512 Age-related nuclear cataract, left eye: Secondary | ICD-10-CM | POA: Diagnosis not present

## 2014-08-05 DIAGNOSIS — H25042 Posterior subcapsular polar age-related cataract, left eye: Secondary | ICD-10-CM | POA: Diagnosis not present

## 2014-08-05 DIAGNOSIS — H2512 Age-related nuclear cataract, left eye: Secondary | ICD-10-CM | POA: Diagnosis not present

## 2014-08-20 DIAGNOSIS — L609 Nail disorder, unspecified: Secondary | ICD-10-CM | POA: Diagnosis not present

## 2014-08-20 DIAGNOSIS — L299 Pruritus, unspecified: Secondary | ICD-10-CM | POA: Diagnosis not present

## 2014-08-20 DIAGNOSIS — L853 Xerosis cutis: Secondary | ICD-10-CM | POA: Diagnosis not present

## 2014-08-20 DIAGNOSIS — L719 Rosacea, unspecified: Secondary | ICD-10-CM | POA: Diagnosis not present

## 2014-08-20 DIAGNOSIS — L219 Seborrheic dermatitis, unspecified: Secondary | ICD-10-CM | POA: Diagnosis not present

## 2014-10-29 DIAGNOSIS — R0689 Other abnormalities of breathing: Secondary | ICD-10-CM | POA: Diagnosis not present

## 2014-10-29 DIAGNOSIS — J40 Bronchitis, not specified as acute or chronic: Secondary | ICD-10-CM | POA: Diagnosis not present

## 2014-10-29 DIAGNOSIS — R05 Cough: Secondary | ICD-10-CM | POA: Diagnosis not present

## 2014-10-29 DIAGNOSIS — R509 Fever, unspecified: Secondary | ICD-10-CM | POA: Diagnosis not present

## 2015-01-24 DIAGNOSIS — J40 Bronchitis, not specified as acute or chronic: Secondary | ICD-10-CM | POA: Diagnosis not present

## 2015-01-24 DIAGNOSIS — R05 Cough: Secondary | ICD-10-CM | POA: Diagnosis not present

## 2015-02-15 DIAGNOSIS — M5412 Radiculopathy, cervical region: Secondary | ICD-10-CM | POA: Diagnosis not present

## 2015-03-05 DIAGNOSIS — R300 Dysuria: Secondary | ICD-10-CM | POA: Diagnosis not present

## 2015-03-05 DIAGNOSIS — M549 Dorsalgia, unspecified: Secondary | ICD-10-CM | POA: Diagnosis not present

## 2015-03-05 DIAGNOSIS — R531 Weakness: Secondary | ICD-10-CM | POA: Diagnosis not present

## 2015-03-05 DIAGNOSIS — R42 Dizziness and giddiness: Secondary | ICD-10-CM | POA: Diagnosis not present

## 2015-03-06 DIAGNOSIS — M542 Cervicalgia: Secondary | ICD-10-CM | POA: Diagnosis not present

## 2015-03-10 DIAGNOSIS — E782 Mixed hyperlipidemia: Secondary | ICD-10-CM | POA: Diagnosis not present

## 2015-03-10 DIAGNOSIS — F322 Major depressive disorder, single episode, severe without psychotic features: Secondary | ICD-10-CM | POA: Diagnosis not present

## 2015-03-10 DIAGNOSIS — I10 Essential (primary) hypertension: Secondary | ICD-10-CM | POA: Diagnosis not present

## 2015-03-10 DIAGNOSIS — N393 Stress incontinence (female) (male): Secondary | ICD-10-CM | POA: Diagnosis not present

## 2015-03-10 DIAGNOSIS — M5430 Sciatica, unspecified side: Secondary | ICD-10-CM | POA: Diagnosis not present

## 2015-03-10 DIAGNOSIS — G4700 Insomnia, unspecified: Secondary | ICD-10-CM | POA: Diagnosis not present

## 2015-03-15 DIAGNOSIS — M5412 Radiculopathy, cervical region: Secondary | ICD-10-CM | POA: Diagnosis not present

## 2015-03-18 ENCOUNTER — Ambulatory Visit
Admission: RE | Admit: 2015-03-18 | Discharge: 2015-03-18 | Disposition: A | Discharge status: Home / Self Care | Payer: Medicare Other | Source: Ambulatory Visit | Attending: Family Medicine | Admitting: Family Medicine

## 2015-03-18 ENCOUNTER — Other Ambulatory Visit: Payer: Self-pay | Admitting: Family Medicine

## 2015-03-18 DIAGNOSIS — M5136 Other intervertebral disc degeneration, lumbar region: Secondary | ICD-10-CM | POA: Diagnosis not present

## 2015-03-18 DIAGNOSIS — R159 Full incontinence of feces: Secondary | ICD-10-CM

## 2015-03-18 DIAGNOSIS — R29898 Other symptoms and signs involving the musculoskeletal system: Secondary | ICD-10-CM

## 2015-03-18 DIAGNOSIS — M47817 Spondylosis without myelopathy or radiculopathy, lumbosacral region: Secondary | ICD-10-CM | POA: Diagnosis not present

## 2015-03-18 DIAGNOSIS — M5126 Other intervertebral disc displacement, lumbar region: Secondary | ICD-10-CM | POA: Diagnosis not present

## 2015-03-29 DIAGNOSIS — M545 Low back pain: Secondary | ICD-10-CM | POA: Diagnosis not present

## 2015-03-29 DIAGNOSIS — M5416 Radiculopathy, lumbar region: Secondary | ICD-10-CM | POA: Diagnosis not present

## 2015-03-31 DIAGNOSIS — M545 Low back pain: Secondary | ICD-10-CM | POA: Diagnosis not present

## 2015-03-31 DIAGNOSIS — M5416 Radiculopathy, lumbar region: Secondary | ICD-10-CM | POA: Diagnosis not present

## 2015-04-01 DIAGNOSIS — M545 Low back pain: Secondary | ICD-10-CM | POA: Diagnosis not present

## 2015-04-01 DIAGNOSIS — M5416 Radiculopathy, lumbar region: Secondary | ICD-10-CM | POA: Diagnosis not present

## 2015-04-07 DIAGNOSIS — M5416 Radiculopathy, lumbar region: Secondary | ICD-10-CM | POA: Diagnosis not present

## 2015-04-07 DIAGNOSIS — M545 Low back pain: Secondary | ICD-10-CM | POA: Diagnosis not present

## 2015-04-13 DIAGNOSIS — H43822 Vitreomacular adhesion, left eye: Secondary | ICD-10-CM | POA: Diagnosis not present

## 2015-04-13 DIAGNOSIS — H43821 Vitreomacular adhesion, right eye: Secondary | ICD-10-CM | POA: Diagnosis not present

## 2015-04-14 DIAGNOSIS — M545 Low back pain: Secondary | ICD-10-CM | POA: Diagnosis not present

## 2015-04-14 DIAGNOSIS — M5416 Radiculopathy, lumbar region: Secondary | ICD-10-CM | POA: Diagnosis not present

## 2015-04-16 DIAGNOSIS — M5412 Radiculopathy, cervical region: Secondary | ICD-10-CM | POA: Diagnosis not present

## 2015-04-20 DIAGNOSIS — H43822 Vitreomacular adhesion, left eye: Secondary | ICD-10-CM | POA: Diagnosis not present

## 2015-04-21 DIAGNOSIS — M545 Low back pain: Secondary | ICD-10-CM | POA: Diagnosis not present

## 2015-04-21 DIAGNOSIS — M5416 Radiculopathy, lumbar region: Secondary | ICD-10-CM | POA: Diagnosis not present

## 2015-04-28 DIAGNOSIS — M545 Low back pain: Secondary | ICD-10-CM | POA: Diagnosis not present

## 2015-04-28 DIAGNOSIS — M5416 Radiculopathy, lumbar region: Secondary | ICD-10-CM | POA: Diagnosis not present

## 2015-05-04 DIAGNOSIS — M5412 Radiculopathy, cervical region: Secondary | ICD-10-CM | POA: Diagnosis not present

## 2015-05-06 DIAGNOSIS — R350 Frequency of micturition: Secondary | ICD-10-CM | POA: Diagnosis not present

## 2015-05-06 DIAGNOSIS — R351 Nocturia: Secondary | ICD-10-CM | POA: Diagnosis not present

## 2015-05-06 DIAGNOSIS — N3944 Nocturnal enuresis: Secondary | ICD-10-CM | POA: Diagnosis not present

## 2015-05-06 DIAGNOSIS — N3946 Mixed incontinence: Secondary | ICD-10-CM | POA: Diagnosis not present

## 2015-05-11 DIAGNOSIS — M5412 Radiculopathy, cervical region: Secondary | ICD-10-CM | POA: Diagnosis not present

## 2015-05-14 DIAGNOSIS — R350 Frequency of micturition: Secondary | ICD-10-CM | POA: Diagnosis not present

## 2015-05-14 DIAGNOSIS — N3946 Mixed incontinence: Secondary | ICD-10-CM | POA: Diagnosis not present

## 2015-05-14 DIAGNOSIS — R351 Nocturia: Secondary | ICD-10-CM | POA: Diagnosis not present

## 2015-05-19 DIAGNOSIS — H43822 Vitreomacular adhesion, left eye: Secondary | ICD-10-CM | POA: Diagnosis not present

## 2015-05-20 DIAGNOSIS — H43822 Vitreomacular adhesion, left eye: Secondary | ICD-10-CM | POA: Diagnosis not present

## 2015-05-20 DIAGNOSIS — Z09 Encounter for follow-up examination after completed treatment for conditions other than malignant neoplasm: Secondary | ICD-10-CM | POA: Diagnosis not present

## 2015-05-27 DIAGNOSIS — R350 Frequency of micturition: Secondary | ICD-10-CM | POA: Diagnosis not present

## 2015-05-27 DIAGNOSIS — N3946 Mixed incontinence: Secondary | ICD-10-CM | POA: Diagnosis not present

## 2015-05-28 DIAGNOSIS — Z09 Encounter for follow-up examination after completed treatment for conditions other than malignant neoplasm: Secondary | ICD-10-CM | POA: Diagnosis not present

## 2015-05-28 DIAGNOSIS — H43822 Vitreomacular adhesion, left eye: Secondary | ICD-10-CM | POA: Diagnosis not present

## 2015-05-31 ENCOUNTER — Other Ambulatory Visit: Payer: Self-pay

## 2015-05-31 DIAGNOSIS — Z1231 Encounter for screening mammogram for malignant neoplasm of breast: Secondary | ICD-10-CM

## 2015-06-02 DIAGNOSIS — M5412 Radiculopathy, cervical region: Secondary | ICD-10-CM | POA: Diagnosis not present

## 2015-06-08 ENCOUNTER — Ambulatory Visit
Admission: RE | Admit: 2015-06-08 | Discharge: 2015-06-08 | Disposition: A | Discharge status: Home / Self Care | Payer: Medicare Other | Source: Ambulatory Visit

## 2015-06-08 DIAGNOSIS — Z1231 Encounter for screening mammogram for malignant neoplasm of breast: Secondary | ICD-10-CM

## 2015-06-11 DIAGNOSIS — M5412 Radiculopathy, cervical region: Secondary | ICD-10-CM | POA: Diagnosis not present

## 2015-08-16 DIAGNOSIS — M899 Disorder of bone, unspecified: Secondary | ICD-10-CM | POA: Diagnosis not present

## 2015-08-16 DIAGNOSIS — N3281 Overactive bladder: Secondary | ICD-10-CM | POA: Diagnosis not present

## 2015-08-16 DIAGNOSIS — Z23 Encounter for immunization: Secondary | ICD-10-CM | POA: Diagnosis not present

## 2015-08-16 DIAGNOSIS — F322 Major depressive disorder, single episode, severe without psychotic features: Secondary | ICD-10-CM | POA: Diagnosis not present

## 2015-08-16 DIAGNOSIS — G4700 Insomnia, unspecified: Secondary | ICD-10-CM | POA: Diagnosis not present

## 2015-08-16 DIAGNOSIS — M549 Dorsalgia, unspecified: Secondary | ICD-10-CM | POA: Diagnosis not present

## 2015-08-16 DIAGNOSIS — I10 Essential (primary) hypertension: Secondary | ICD-10-CM | POA: Diagnosis not present

## 2015-08-16 DIAGNOSIS — R7301 Impaired fasting glucose: Secondary | ICD-10-CM | POA: Diagnosis not present

## 2015-08-16 DIAGNOSIS — E782 Mixed hyperlipidemia: Secondary | ICD-10-CM | POA: Diagnosis not present

## 2015-08-16 DIAGNOSIS — Z0000 Encounter for general adult medical examination without abnormal findings: Secondary | ICD-10-CM | POA: Diagnosis not present

## 2015-08-16 DIAGNOSIS — J309 Allergic rhinitis, unspecified: Secondary | ICD-10-CM | POA: Diagnosis not present

## 2015-08-16 DIAGNOSIS — N393 Stress incontinence (female) (male): Secondary | ICD-10-CM | POA: Diagnosis not present

## 2015-08-31 DIAGNOSIS — Z961 Presence of intraocular lens: Secondary | ICD-10-CM | POA: Diagnosis not present

## 2015-08-31 DIAGNOSIS — H43822 Vitreomacular adhesion, left eye: Secondary | ICD-10-CM | POA: Diagnosis not present

## 2015-08-31 DIAGNOSIS — H43391 Other vitreous opacities, right eye: Secondary | ICD-10-CM | POA: Diagnosis not present

## 2015-08-31 DIAGNOSIS — H43811 Vitreous degeneration, right eye: Secondary | ICD-10-CM | POA: Diagnosis not present

## 2015-09-01 DIAGNOSIS — N3944 Nocturnal enuresis: Secondary | ICD-10-CM | POA: Diagnosis not present

## 2015-09-01 DIAGNOSIS — R278 Other lack of coordination: Secondary | ICD-10-CM | POA: Diagnosis not present

## 2015-09-01 DIAGNOSIS — M6281 Muscle weakness (generalized): Secondary | ICD-10-CM | POA: Diagnosis not present

## 2015-09-01 DIAGNOSIS — N3946 Mixed incontinence: Secondary | ICD-10-CM | POA: Diagnosis not present

## 2015-09-02 ENCOUNTER — Other Ambulatory Visit: Payer: Self-pay | Admitting: Obstetrics & Gynecology

## 2015-09-02 DIAGNOSIS — Z87410 Personal history of cervical dysplasia: Secondary | ICD-10-CM | POA: Diagnosis not present

## 2015-09-02 DIAGNOSIS — Z87412 Personal history of vulvar dysplasia: Secondary | ICD-10-CM | POA: Diagnosis not present

## 2015-09-06 LAB — CYTOLOGY - PAP

## 2015-09-07 DIAGNOSIS — M8589 Other specified disorders of bone density and structure, multiple sites: Secondary | ICD-10-CM | POA: Diagnosis not present

## 2015-09-07 DIAGNOSIS — M859 Disorder of bone density and structure, unspecified: Secondary | ICD-10-CM | POA: Diagnosis not present

## 2015-09-09 DIAGNOSIS — R52 Pain, unspecified: Secondary | ICD-10-CM | POA: Diagnosis not present

## 2015-09-09 DIAGNOSIS — J0140 Acute pansinusitis, unspecified: Secondary | ICD-10-CM | POA: Diagnosis not present

## 2015-09-09 DIAGNOSIS — R05 Cough: Secondary | ICD-10-CM | POA: Diagnosis not present

## 2015-09-16 DIAGNOSIS — Z87412 Personal history of vulvar dysplasia: Secondary | ICD-10-CM | POA: Diagnosis not present

## 2015-09-16 DIAGNOSIS — R87615 Unsatisfactory cytologic smear of cervix: Secondary | ICD-10-CM | POA: Diagnosis not present

## 2015-09-16 DIAGNOSIS — N890 Mild vaginal dysplasia: Secondary | ICD-10-CM | POA: Diagnosis not present

## 2015-09-16 DIAGNOSIS — Z87410 Personal history of cervical dysplasia: Secondary | ICD-10-CM | POA: Diagnosis not present

## 2015-12-28 DIAGNOSIS — J029 Acute pharyngitis, unspecified: Secondary | ICD-10-CM | POA: Diagnosis not present

## 2015-12-28 DIAGNOSIS — J40 Bronchitis, not specified as acute or chronic: Secondary | ICD-10-CM | POA: Diagnosis not present

## 2016-01-04 DIAGNOSIS — R062 Wheezing: Secondary | ICD-10-CM | POA: Diagnosis not present

## 2016-01-04 DIAGNOSIS — J329 Chronic sinusitis, unspecified: Secondary | ICD-10-CM | POA: Diagnosis not present

## 2016-01-20 DIAGNOSIS — M461 Sacroiliitis, not elsewhere classified: Secondary | ICD-10-CM | POA: Diagnosis not present

## 2016-01-20 DIAGNOSIS — R350 Frequency of micturition: Secondary | ICD-10-CM | POA: Diagnosis not present

## 2016-01-20 DIAGNOSIS — N3000 Acute cystitis without hematuria: Secondary | ICD-10-CM | POA: Diagnosis not present

## 2016-02-16 ENCOUNTER — Other Ambulatory Visit: Payer: Self-pay | Admitting: Family Medicine

## 2016-02-16 DIAGNOSIS — M549 Dorsalgia, unspecified: Secondary | ICD-10-CM | POA: Diagnosis not present

## 2016-02-16 DIAGNOSIS — R1013 Epigastric pain: Secondary | ICD-10-CM | POA: Diagnosis not present

## 2016-02-16 DIAGNOSIS — R7301 Impaired fasting glucose: Secondary | ICD-10-CM | POA: Diagnosis not present

## 2016-02-16 DIAGNOSIS — R238 Other skin changes: Secondary | ICD-10-CM | POA: Diagnosis not present

## 2016-02-16 DIAGNOSIS — F322 Major depressive disorder, single episode, severe without psychotic features: Secondary | ICD-10-CM | POA: Diagnosis not present

## 2016-02-16 DIAGNOSIS — E782 Mixed hyperlipidemia: Secondary | ICD-10-CM | POA: Diagnosis not present

## 2016-02-16 DIAGNOSIS — N6489 Other specified disorders of breast: Secondary | ICD-10-CM | POA: Diagnosis not present

## 2016-02-16 DIAGNOSIS — I10 Essential (primary) hypertension: Secondary | ICD-10-CM | POA: Diagnosis not present

## 2016-02-16 DIAGNOSIS — R252 Cramp and spasm: Secondary | ICD-10-CM | POA: Diagnosis not present

## 2016-02-22 ENCOUNTER — Other Ambulatory Visit: Payer: Medicare Other

## 2016-02-24 ENCOUNTER — Ambulatory Visit
Admission: RE | Admit: 2016-02-24 | Discharge: 2016-02-24 | Disposition: A | Discharge status: Home / Self Care | Payer: Medicare Other | Source: Ambulatory Visit | Attending: Family Medicine | Admitting: Family Medicine

## 2016-02-24 DIAGNOSIS — K7689 Other specified diseases of liver: Secondary | ICD-10-CM | POA: Diagnosis not present

## 2016-02-24 DIAGNOSIS — R1013 Epigastric pain: Secondary | ICD-10-CM

## 2016-03-16 ENCOUNTER — Other Ambulatory Visit: Payer: Self-pay | Admitting: Obstetrics & Gynecology

## 2016-03-16 DIAGNOSIS — N761 Subacute and chronic vaginitis: Secondary | ICD-10-CM | POA: Diagnosis not present

## 2016-03-16 DIAGNOSIS — N3281 Overactive bladder: Secondary | ICD-10-CM | POA: Diagnosis not present

## 2016-03-16 DIAGNOSIS — N890 Mild vaginal dysplasia: Secondary | ICD-10-CM | POA: Diagnosis not present

## 2016-05-12 ENCOUNTER — Other Ambulatory Visit: Payer: Self-pay | Admitting: Unknown Physician Specialty

## 2016-05-12 ENCOUNTER — Other Ambulatory Visit: Payer: Self-pay | Admitting: Family Medicine

## 2016-05-12 DIAGNOSIS — Z1231 Encounter for screening mammogram for malignant neoplasm of breast: Secondary | ICD-10-CM

## 2016-06-08 ENCOUNTER — Ambulatory Visit: Payer: Medicare Other

## 2016-06-08 ENCOUNTER — Ambulatory Visit
Admission: RE | Admit: 2016-06-08 | Discharge: 2016-06-08 | Disposition: A | Discharge status: Home / Self Care | Payer: Medicare Other | Source: Ambulatory Visit | Attending: Unknown Physician Specialty | Admitting: Unknown Physician Specialty

## 2016-06-08 DIAGNOSIS — Z1231 Encounter for screening mammogram for malignant neoplasm of breast: Secondary | ICD-10-CM

## 2016-06-19 DIAGNOSIS — T8549XA Other mechanical complication of breast prosthesis and implant, initial encounter: Secondary | ICD-10-CM | POA: Diagnosis not present

## 2016-06-29 DIAGNOSIS — Z23 Encounter for immunization: Secondary | ICD-10-CM | POA: Diagnosis not present

## 2016-06-29 DIAGNOSIS — R252 Cramp and spasm: Secondary | ICD-10-CM | POA: Diagnosis not present

## 2016-06-29 DIAGNOSIS — R197 Diarrhea, unspecified: Secondary | ICD-10-CM | POA: Diagnosis not present

## 2016-09-07 DIAGNOSIS — M549 Dorsalgia, unspecified: Secondary | ICD-10-CM | POA: Diagnosis not present

## 2016-09-07 DIAGNOSIS — M542 Cervicalgia: Secondary | ICD-10-CM | POA: Diagnosis not present

## 2016-09-07 DIAGNOSIS — L309 Dermatitis, unspecified: Secondary | ICD-10-CM | POA: Diagnosis not present

## 2016-10-04 DIAGNOSIS — J019 Acute sinusitis, unspecified: Secondary | ICD-10-CM | POA: Diagnosis not present

## 2016-10-13 DIAGNOSIS — J208 Acute bronchitis due to other specified organisms: Secondary | ICD-10-CM | POA: Diagnosis not present

## 2016-10-13 DIAGNOSIS — J0100 Acute maxillary sinusitis, unspecified: Secondary | ICD-10-CM | POA: Diagnosis not present

## 2016-10-19 DIAGNOSIS — R062 Wheezing: Secondary | ICD-10-CM | POA: Diagnosis not present

## 2016-10-19 DIAGNOSIS — R05 Cough: Secondary | ICD-10-CM | POA: Diagnosis not present

## 2016-11-15 DIAGNOSIS — F322 Major depressive disorder, single episode, severe without psychotic features: Secondary | ICD-10-CM | POA: Diagnosis not present

## 2016-11-15 DIAGNOSIS — R05 Cough: Secondary | ICD-10-CM | POA: Diagnosis not present

## 2016-11-15 DIAGNOSIS — Z0000 Encounter for general adult medical examination without abnormal findings: Secondary | ICD-10-CM | POA: Diagnosis not present

## 2016-11-15 DIAGNOSIS — M549 Dorsalgia, unspecified: Secondary | ICD-10-CM | POA: Diagnosis not present

## 2016-11-15 DIAGNOSIS — I10 Essential (primary) hypertension: Secondary | ICD-10-CM | POA: Diagnosis not present

## 2016-11-15 DIAGNOSIS — G4700 Insomnia, unspecified: Secondary | ICD-10-CM | POA: Diagnosis not present

## 2016-11-15 DIAGNOSIS — E782 Mixed hyperlipidemia: Secondary | ICD-10-CM | POA: Diagnosis not present

## 2016-11-15 DIAGNOSIS — M899 Disorder of bone, unspecified: Secondary | ICD-10-CM | POA: Diagnosis not present

## 2016-11-15 DIAGNOSIS — N3281 Overactive bladder: Secondary | ICD-10-CM | POA: Diagnosis not present

## 2016-11-15 DIAGNOSIS — R7301 Impaired fasting glucose: Secondary | ICD-10-CM | POA: Diagnosis not present

## 2016-11-15 DIAGNOSIS — J309 Allergic rhinitis, unspecified: Secondary | ICD-10-CM | POA: Diagnosis not present

## 2017-02-14 DIAGNOSIS — E782 Mixed hyperlipidemia: Secondary | ICD-10-CM | POA: Diagnosis not present

## 2017-03-05 DIAGNOSIS — J302 Other seasonal allergic rhinitis: Secondary | ICD-10-CM | POA: Diagnosis not present

## 2017-03-05 DIAGNOSIS — R3915 Urgency of urination: Secondary | ICD-10-CM | POA: Diagnosis not present

## 2017-03-05 DIAGNOSIS — I10 Essential (primary) hypertension: Secondary | ICD-10-CM | POA: Diagnosis not present

## 2017-03-05 DIAGNOSIS — R040 Epistaxis: Secondary | ICD-10-CM | POA: Diagnosis not present

## 2017-03-21 DIAGNOSIS — N890 Mild vaginal dysplasia: Secondary | ICD-10-CM | POA: Diagnosis not present

## 2017-03-21 DIAGNOSIS — Z01419 Encounter for gynecological examination (general) (routine) without abnormal findings: Secondary | ICD-10-CM | POA: Diagnosis not present

## 2017-03-21 DIAGNOSIS — N3281 Overactive bladder: Secondary | ICD-10-CM | POA: Diagnosis not present

## 2017-05-21 DIAGNOSIS — N3946 Mixed incontinence: Secondary | ICD-10-CM | POA: Diagnosis not present

## 2017-05-21 DIAGNOSIS — F322 Major depressive disorder, single episode, severe without psychotic features: Secondary | ICD-10-CM | POA: Diagnosis not present

## 2017-05-21 DIAGNOSIS — I10 Essential (primary) hypertension: Secondary | ICD-10-CM | POA: Diagnosis not present

## 2017-05-21 DIAGNOSIS — R7301 Impaired fasting glucose: Secondary | ICD-10-CM | POA: Diagnosis not present

## 2017-05-21 DIAGNOSIS — R040 Epistaxis: Secondary | ICD-10-CM | POA: Diagnosis not present

## 2017-05-21 DIAGNOSIS — K589 Irritable bowel syndrome without diarrhea: Secondary | ICD-10-CM | POA: Diagnosis not present

## 2017-05-21 DIAGNOSIS — E782 Mixed hyperlipidemia: Secondary | ICD-10-CM | POA: Diagnosis not present

## 2017-05-21 DIAGNOSIS — M549 Dorsalgia, unspecified: Secondary | ICD-10-CM | POA: Diagnosis not present

## 2017-05-29 ENCOUNTER — Ambulatory Visit: Payer: Medicare Other | Admitting: Physical Therapy

## 2017-07-19 DIAGNOSIS — J343 Hypertrophy of nasal turbinates: Secondary | ICD-10-CM | POA: Diagnosis not present

## 2017-07-19 DIAGNOSIS — Z7289 Other problems related to lifestyle: Secondary | ICD-10-CM | POA: Diagnosis not present

## 2017-07-19 DIAGNOSIS — Z7722 Contact with and (suspected) exposure to environmental tobacco smoke (acute) (chronic): Secondary | ICD-10-CM | POA: Diagnosis not present

## 2017-07-19 DIAGNOSIS — R040 Epistaxis: Secondary | ICD-10-CM | POA: Insufficient documentation

## 2017-07-19 DIAGNOSIS — J3489 Other specified disorders of nose and nasal sinuses: Secondary | ICD-10-CM | POA: Diagnosis not present

## 2017-08-30 DIAGNOSIS — Z23 Encounter for immunization: Secondary | ICD-10-CM | POA: Diagnosis not present

## 2017-08-30 DIAGNOSIS — R197 Diarrhea, unspecified: Secondary | ICD-10-CM | POA: Diagnosis not present

## 2017-08-30 DIAGNOSIS — F322 Major depressive disorder, single episode, severe without psychotic features: Secondary | ICD-10-CM | POA: Diagnosis not present

## 2017-08-30 DIAGNOSIS — R5383 Other fatigue: Secondary | ICD-10-CM | POA: Diagnosis not present

## 2017-08-30 DIAGNOSIS — R5381 Other malaise: Secondary | ICD-10-CM | POA: Diagnosis not present

## 2017-09-04 DIAGNOSIS — F322 Major depressive disorder, single episode, severe without psychotic features: Secondary | ICD-10-CM | POA: Diagnosis not present

## 2017-09-04 DIAGNOSIS — K589 Irritable bowel syndrome without diarrhea: Secondary | ICD-10-CM | POA: Diagnosis not present

## 2017-10-04 DIAGNOSIS — S199XXA Unspecified injury of neck, initial encounter: Secondary | ICD-10-CM | POA: Diagnosis not present

## 2017-10-04 DIAGNOSIS — F419 Anxiety disorder, unspecified: Secondary | ICD-10-CM | POA: Diagnosis not present

## 2017-10-04 DIAGNOSIS — F329 Major depressive disorder, single episode, unspecified: Secondary | ICD-10-CM | POA: Diagnosis not present

## 2017-10-04 DIAGNOSIS — Z043 Encounter for examination and observation following other accident: Secondary | ICD-10-CM | POA: Diagnosis not present

## 2017-10-04 DIAGNOSIS — R296 Repeated falls: Secondary | ICD-10-CM | POA: Diagnosis not present

## 2017-10-04 DIAGNOSIS — R51 Headache: Secondary | ICD-10-CM | POA: Diagnosis not present

## 2017-10-04 DIAGNOSIS — R197 Diarrhea, unspecified: Secondary | ICD-10-CM | POA: Diagnosis not present

## 2017-10-04 DIAGNOSIS — M542 Cervicalgia: Secondary | ICD-10-CM | POA: Diagnosis not present

## 2017-10-04 DIAGNOSIS — Z981 Arthrodesis status: Secondary | ICD-10-CM | POA: Diagnosis not present

## 2017-10-04 DIAGNOSIS — R0602 Shortness of breath: Secondary | ICD-10-CM | POA: Diagnosis not present

## 2017-10-04 DIAGNOSIS — Z90710 Acquired absence of both cervix and uterus: Secondary | ICD-10-CM | POA: Diagnosis not present

## 2017-10-04 DIAGNOSIS — S0512XA Contusion of eyeball and orbital tissues, left eye, initial encounter: Secondary | ICD-10-CM | POA: Diagnosis not present

## 2017-10-04 DIAGNOSIS — Z888 Allergy status to other drugs, medicaments and biological substances status: Secondary | ICD-10-CM | POA: Diagnosis not present

## 2017-10-04 DIAGNOSIS — M1990 Unspecified osteoarthritis, unspecified site: Secondary | ICD-10-CM | POA: Diagnosis not present

## 2017-10-04 DIAGNOSIS — Z791 Long term (current) use of non-steroidal anti-inflammatories (NSAID): Secondary | ICD-10-CM | POA: Diagnosis not present

## 2017-10-04 DIAGNOSIS — W19XXXA Unspecified fall, initial encounter: Secondary | ICD-10-CM | POA: Diagnosis not present

## 2017-10-04 DIAGNOSIS — S0511XA Contusion of eyeball and orbital tissues, right eye, initial encounter: Secondary | ICD-10-CM | POA: Diagnosis not present

## 2017-10-04 DIAGNOSIS — S0990XA Unspecified injury of head, initial encounter: Secondary | ICD-10-CM | POA: Diagnosis not present

## 2017-10-04 DIAGNOSIS — R42 Dizziness and giddiness: Secondary | ICD-10-CM | POA: Diagnosis not present

## 2017-10-04 DIAGNOSIS — M47892 Other spondylosis, cervical region: Secondary | ICD-10-CM | POA: Diagnosis not present

## 2017-10-04 DIAGNOSIS — Z7982 Long term (current) use of aspirin: Secondary | ICD-10-CM | POA: Diagnosis not present

## 2017-10-04 DIAGNOSIS — Z79899 Other long term (current) drug therapy: Secondary | ICD-10-CM | POA: Diagnosis not present

## 2017-10-10 ENCOUNTER — Ambulatory Visit: Payer: Medicare Other | Attending: Family Medicine | Admitting: Physical Therapy

## 2017-10-10 ENCOUNTER — Encounter: Payer: Self-pay | Admitting: Physical Therapy

## 2017-10-10 DIAGNOSIS — M6281 Muscle weakness (generalized): Secondary | ICD-10-CM | POA: Insufficient documentation

## 2017-10-10 DIAGNOSIS — R279 Unspecified lack of coordination: Secondary | ICD-10-CM | POA: Diagnosis not present

## 2017-10-10 NOTE — Therapy (Signed)
[facility] [address] [place], [place], [address] Phone: [phone]   Fax:  [phone]  Physical Therapy Evaluation  Patient Details  Name: Allison Pierce MRN: [id number] Date of Birth: Mar 21, 1946 Referring Provider: Dr. [name]   Encounter Date: 10/10/2017  PT End of Session - 10/10/17 1214    Visit Number  1    Date for PT Re-Evaluation  12/05/17    Authorization Type  Medicare    PT Start Time  1100    PT Stop Time  1213    PT Time Calculation (min)  73 min    Activity Tolerance  Patient tolerated treatment well    Behavior During Therapy  [facility] for tasks assessed/performed       Past Medical History:  Diagnosis Date  . Anxiety   . Depression   . GERD (gastroesophageal reflux disease) OCCASIONALLY  TAKE TUMS  . Hyperlipidemia   . Hypertension   . Vulvar lesion     Past Surgical History:  Procedure Laterality Date  . ABDOMINAL HYSTERECTOMY  1992   W/ SALPINGO-OOPHORECTOMY  . ANTERIOR CERVICAL DECOMP/DISCECTOMY FUSION  01-18-2009  DR POOLE   C4  - C6  . BREAST ENHANCEMENT SURGERY  2011  . BREAST SURGERY  02-04-2011  dr [place]--  CALCIFICATION  . VULVAR LESION REMOVAL  09/10/2012   Procedure: VULVAR LESION;  Surgeon: Selinda Orion, MD;  Location: [facility];  Service: Gynecology;  Laterality: N/A;  WIDE EXCISION OF VULVAR LESION    There were no vitals filed for this visit.   Subjective Assessment - 10/10/17 1112    Subjective  Patient reports 5-6 years ago had urinary incontinence.  At night trouble getting to the rest room and more trouble than in the day.  when she coughs, laughs leak urine.  Pads per day is 1 during the day and 1 at night    Patient Stated Goals  reduce urinary leakage    Currently in Pain?  No/denies         [facility] PT Assessment - 10/10/17 0001      Assessment   Medical Diagnosis  N39.46 Urge and stress incontinence     Referring Provider  Dr. [name]    Onset Date/Surgical Date  04/09/17    Prior Therapy  none      Precautions   Precautions  None      Restrictions   Weight Bearing Restrictions  No      Balance Screen   Has the patient fallen in the past 6 months  Yes    How many times?  1 emergency room said her pressure dropped, trying to get BR    Has the patient had a decrease in activity level because of a fear of falling?   No    Is the patient reluctant to leave their home because of a fear of falling?   No      Home Film/video editor residence      Prior Function   Level of Independence  Independent    Vocation  Retired      Associate Professor   Overall Cognitive Status  Within Functional Limits for tasks assessed      Observation/Other Assessments   Skin Integrity  hysterectomy site has decreased tissue mobility    Focus on Therapeutic Outcomes (FOTO)   40% limitation due to urinary leakage and has to  run to the bathroom when she has the urge      ROM / Strength   AROM / PROM / Strength  AROM;PROM;Strength      AROM   Overall AROM Comments  Lumbar ROM is full      Strength   Right Hip Extension  4+/5    Right Hip ABduction  3+/5    Right Hip ADduction  3+/5    Left Hip Extension  4+/5    Left Hip ABduction  3+/5    Left Hip ADduction  4+/5      Palpation   SI assessment   pelvis in correct alignment    Palpation comment  tightness located in bil. diaphragm      High Level Balance   High Level Balance Comments  stand on one leg has moderate trunk sway             Objective measurements completed on examination: See above findings.    Pelvic Floor Special Questions - 10/10/17 0001    Prior Pregnancies  Yes    Number of Vaginal Deliveries  1    Diastasis Recti  1 finger width above and below umbilicus    Currently Sexually Active  Yes no arousal    Is this Painful  Yes initial penetration    Marinoff Scale  discomfort that does not  affect completion no climax    Urinary Leakage  Yes    Activities that cause leaking  With strong urge;Coughing;Sneezing;Laughing;Running    Urinary frequency  every hour with small and large amount and frequent bowel movements    Fecal incontinence  -- bowels are loose and nuggets    Skin Integrity  Intact dryness    Prolapse  None    Pelvic Floor Internal Exam  Patient approves PT to assess patient pelvic floor and approves PT to assess muscle integrity and treat    Exam Type  Vaginal    Palpation  tightness in introitus and posterior intoitus, bil. urethra sphincter     Strength  fair squeeze, definite lift    Tone  increased               PT Education - 10/10/17 1212    Education provided  Yes    Education Details  information on vaginal lubricants and moisturizers; bladder irritants; pelvic floor tighten and elongation; gave patient several samples of lubricants to try    Person(s) Educated  Patient    Methods  Explanation;Demonstration;Verbal cues;Handout    Comprehension  Returned demonstration;Verbalized understanding       PT Short Term Goals - 10/10/17 1227      PT SHORT TERM GOAL #1   Title  independent with initial HEP including flexbility exericses and abdominal strength    Time  4    Period  Weeks    Status  New    Target Date  11/07/17      PT SHORT TERM GOAL #2   Title  ability to contract lower abdominals for abdominal bracing correctly    Time  4    Period  Weeks    Status  New    Target Date  11/07/17      PT SHORT TERM GOAL #3   Title  urinary leakage at night decreased >/= 25% when she is getting up out of bed    Time  4    Period  Weeks    Status  New    Target Date  11/07/17      PT SHORT TERM GOAL #4   Title  urinary leakage during the day decreased >/= 25% due to increased pelvic floor endurance    Time  4    Period  Weeks    Status  New    Target Date  11/07/17      PT SHORT TERM GOAL #5   Title  understand what bladder  irritants are and how they affect the bladder and urinary leakage    Time  4    Period  Weeks    Status  New    Target Date  11/07/17        PT Long Term Goals - 10/10/17 1230      PT LONG TERM GOAL #1   Title  independent with HEP and understand how to progress herself    Time  8    Period  Weeks    Status  New    Target Date  12/05/17      PT LONG TERM GOAL #2   Title  pain with intercourse decreased >/= 60% due to increased tissue mobility and understand how to use moisturizers for the vaginal area    Time  8    Period  Weeks    Status  New    Target Date  12/05/17      PT LONG TERM GOAL #3   Title  urinary leakage at night decreased >/= 75% due to increased pelvic floor strength and hip strength    Time  8    Period  Weeks    Status  New    Target Date  12/05/17      PT LONG TERM GOAL #4   Title  urinary leakage during the day decreased >/= 75% due to increased pelvic floor strength and hip strength    Time  8    Period  Weeks    Status  New    Target Date  12/05/17      PT LONG TERM GOAL #5   Title  ability to not wear a pad 50% of the time to reduce irritation of the vaginal area    Time  8    Period  Weeks    Status  New    Target Date  12/05/17             Plan - 10/10/17 1215    Clinical Impression Statement  Patient is a 72 year old female with urinary leakage for the past 5-6 years but has increased in the past 6 months.  Patient uses 1 pad per day and at night.  Patient leaks urine with laughing, coughing, urge to void, going from sit to stand, and walking to the commode.  Patient will leak urine on the way to the commode at night and sometimes during the night.  Pelvic floor strength is 3/5.  Decreased mobility of bilateral urethra sphincter, bilateral sides of introitus, and posterior introitus.  Patient has vaginal dryness. Patient reports pain with intercourse at the intial penetration.  Patient reports no climaxing or desiree for intercourse and  it is upsetting to her. Abdominal strength is 2/5 with decreased mobility of hysterectomy scar.  Bilateral hip strength averages 3+/5 to 4/5.  Patient fell before Christmas when her blood pressure dropped. Patient will benefit from skilled therapy to improve pelvic floor strength and coordination and increased functional mobility.     History and Personal Factors relevant to plan of care:  Hysterectomy 1995; Vulvar leision 09/10/2012    Clinical Presentation  Stable    Clinical Presentation due to:  stable condition    Clinical Decision Making  Low    Rehab Potential  Excellent    Clinical Impairments Affecting Rehab Potential  Hysterectomy 1995; Vulvar leision 09/10/2012    PT Frequency  1x / week    PT Duration  8 weeks    PT Treatment/Interventions  Biofeedback;Neuromuscular re-education;Patient/family education;Therapeutic exercise;Therapeutic activities;Manual techniques    PT Next Visit Plan  internal soft tissue work; abdominal strength; scar massage; review HEP    PT Home Exercise Plan  hip stretches    Consulted and Agree with Plan of Care  Patient       Patient will benefit from skilled therapeutic intervention in order to improve the following deficits and impairments:  Increased fascial restricitons, Decreased coordination, Decreased scar mobility, Increased muscle spasms, Decreased endurance, Decreased strength  Visit Diagnosis: Muscle weakness (generalized) - Plan: PT plan of care cert/re-cert  Unspecified lack of coordination - Plan: PT plan of care cert/re-cert     Problem List There are no active problems to display for this patient.  Earlie Counts, PT 10/10/17 12:37 PM   [facility] [address] [place], [place], [address] Phone: [phone]   Fax:  [phone]  Name: YASAMIN KAREL MRN: [id number] Date of Birth: 1946/03/02

## 2017-10-10 NOTE — Patient Instructions (Addendum)
Moisturizers . They are used in the vagina to hydrate the mucous membrane that make up the vaginal canal. . Designed to keep a more normal acid balance (ph) . Once placed in the vagina, it will last between two to three days.  . Use 2-3 times per week at bedtime and last longer than 60 min. . Ingredients to avoid is glycerin and fragrance, can increase chance of infection . Should not be used just before sex due to causing irritation . Most are gels administered either in a tampon-shaped applicator or as a vaginal suppository. They are non-hormonal.   Types of Moisturizers . Samul Dada- drug store . Vitamin E vaginal suppositories- Whole foods, Amazon . Moist Again . Coconut oil- can break down condoms . Michail Jewels . Yes moisturizer- amazon . NeuEve Silk , NeuEve Silver for menopausal or over 65 (if have severe vaginal atrophy or cancer treatments use NeuEve Silk for  1 month than move to The Pepsi)- [employer], MapleFlower.dk . Olive and Bee intimate cream- www.oliveandbee.com.au  Creams to use externally on the Vulva area  Albertson's (good for for cancer patients that had radiation to the area)- [place] or Danaher Corporation.FlyingBasics.com.br  V-magic cream - amazon  Julva-amazon  Vital "V [place] salve ( help moisturize and help with thinning vulvar area, does have [place]   Things to avoid in the vaginal area . Do not use things to irritate the vulvar area . No lotions just specialized creams for the vulva area- Neogyn, V-magic, No soaps; can use Aveeno or Calendula cleanser if needed. Must be gentle . No deodorants . No douches . Good to sleep without underwear to let the vaginal area to air out . No scrubbing: spread the lips to let warm water rinse over labias and pat dry  Lubrication . Used for intercourse to reduce friction . Avoid ones that have glycerin, warming gels, tingling gels, icing or  cooling gel, scented . Avoid parabens due to a preservative similar to female sex hormone . May need to be reapplied once or several times during sexual activity . Can be applied to both partners genitals prior to vaginal penetration to minimize friction or irritation . Prevent irritation and mucosal tears that cause post coital pain and increased the risk of vaginal and urinary tract infections . Oil-based lubricants cannot be used with condoms due to breaking them down.  Least likely to irritate vaginal tissue.  . Plant based-lubes are safe . Silicone-based lubrication are thicker and last long and used for post-menopausal women  Vaginal Lubricators Here is a list of some suggested lubricators you can use for intercourse. Use the most hypoallergenic product.  You can place on you or your partner.   Slippery Stuff  Sylk or Sliquid Natural H2O ( good  if frequent UTI's)  Blossom Organics (www.blossom-organics.com)  Luvena   Coconut oil  PJur Woman Nude- water based lubricant, amazon  Uberlube- Amazon  Aloe Vera  Yes lubricant- [employer] Platinum-Silicone, Target, [facility]  Olive and Bee intimate cream-  www.oliveandbee.com.au Things to avoid in lubricants are glycerin, warming gels, tingling gels, icing or cooling  gels, and scented gels.  Also avoid Vaseline. KY jelly, Replens, and Astroglide kills good bacteria(lactobacilli)  Things to avoid in the vaginal area . Do not use things to irritate the vulvar area . No lotions- see below . No soaps; can use Aveeno or Calendula cleanser if needed. Must be  gentle . No deodorants . No douches . Good to sleep without underwear to let the vaginal area to air out . No scrubbing: spread the lips to let warm water rinse over labias and pat dry  Creams that can be used on the [place] Releveum or [place]  [place] Down    Exhaling, bear down as if to have a bowel movement. Then tighten the pelvic floor Repeat __3_ times. Do _2__ times a day.  Copyright  VHI. All rights reserved.  Diastasis Recti Correction With Hands (Hook-Lying)    Spread hands wide on lower abdomen. Inhale. In one fluid movement: Pull in navel, push abdomen muscles together, breath nautrally and Hold for __5_ seconds. Return, rest for _5__ seconds. Repeat _10__ times. Do __2_ times a day.   Copyright  VHI. All rights reserved.    Certain foods and liquids will decrease the pH making the urine more acidic.  Urinary urgency increases when the urine has a low pH.  Most common irritants: alcohol, carbonated beverages and caffinated beverages.  Foods to avoid: apple juice, apples, ascorbic acid, canteloupes, chili, citrus fruits, coffee, cranberries, grapes, guava, peaches, pepper, pineapple, plums, strawberries, tea, tomatoes, and vinegar.  Drinking plenty of water may help to increase the pH and dilute out any of the effects of specific irritants.  Foods that are NOT irritating to the bladder include: Pears, papayas, sun-brewed teas, watermelons, non-citrus herbal teas, apricots, kava and low-acid instant drinks (Postum)  [facility] [address] [place], [place] [address] Phone # [phone] Fax [phone]

## 2017-10-17 ENCOUNTER — Ambulatory Visit: Payer: Medicare Other | Admitting: Physical Therapy

## 2017-10-17 ENCOUNTER — Encounter: Payer: Self-pay | Admitting: Physical Therapy

## 2017-10-17 DIAGNOSIS — M6281 Muscle weakness (generalized): Secondary | ICD-10-CM | POA: Diagnosis not present

## 2017-10-17 DIAGNOSIS — R279 Unspecified lack of coordination: Secondary | ICD-10-CM | POA: Diagnosis not present

## 2017-10-17 NOTE — Patient Instructions (Addendum)
Can sit in a bath with Boric Acid or Epson salt for the boils   Adduction: Hip - Knees Together (Hook-Lying)    Lie with hips and knees bent, towel roll between knees. Push knees together. Hold for _5__ seconds. Rest for _5__ seconds. Repeat _10__ times. Do _2__ times a day.   Copyright  VHI. All rights reserved.   [place] [address], [place], [place] [address] Phone # [phone] Fax [phone]

## 2017-10-17 NOTE — Therapy (Signed)
[facility] [address], [place], [place], [address] Phone: [phone]   Fax:  [phone]  Physical Therapy Treatment  Patient Details  Name: Allison Pierce MRN: [id number] Date of Birth: 1946/07/18 Referring Provider: Dr. [name]   Encounter Date: 10/17/2017  PT End of Session - 10/17/17 1241    Visit Number  2    Date for PT Re-Evaluation  12/05/17    Authorization Type  Medicare    PT Start Time  1145    PT Stop Time  1230    PT Time Calculation (min)  45 min    Activity Tolerance  Patient tolerated treatment well    Behavior During Therapy  [facility] for tasks assessed/performed       Past Medical History:  Diagnosis Date  . Anxiety   . Depression   . GERD (gastroesophageal reflux disease) OCCASIONALLY  TAKE TUMS  . Hyperlipidemia   . Hypertension   . Vulvar lesion     Past Surgical History:  Procedure Laterality Date  . ABDOMINAL HYSTERECTOMY  1992   W/ SALPINGO-OOPHORECTOMY  . ANTERIOR CERVICAL DECOMP/DISCECTOMY FUSION  01-18-2009  DR POOLE   C4  - C6  . BREAST ENHANCEMENT SURGERY  2011  . BREAST SURGERY  02-04-2011  dr [place]--  CALCIFICATION  . VULVAR LESION REMOVAL  09/10/2012   Procedure: VULVAR LESION;  Surgeon: Selinda Orion, MD;  Location: [facility];  Service: Gynecology;  Laterality: N/A;  WIDE EXCISION OF VULVAR LESION    There were no vitals filed for this visit.  Subjective Assessment - 10/17/17 1151    Subjective  I have some questions. I was able to get to the bathroom wihtout urinating on myself.      Currently in Pain?  No/denies                      [facility] Adult PT Treatment/Exercise - 10/17/17 0001      Neuro Re-ed    Neuro Re-ed Details   pelvic floor contraction with tactile cues to the perineum, pelvic floor contraction with ball squeeze      Lumbar Exercises: Supine   Ab Set  20 reps;5  seconds;Limitations    AB Set Limitations  tactile and verbal cues; min to moderate difficulty    Other Supine Lumbar Exercises  hookly ball squeeze hold 5 sec 10x due to being easier      Manual Therapy   Manual Therapy  Soft tissue mobilization;Myofascial release    Soft tissue mobilization  scar massage from hysterectomy; around umbilicus;     Myofascial Release  release the bladder from the intestines, release the vaginal from the sac of douglas             PT Education - 10/17/17 1227    Education provided  Yes    Education Details  pelvic floor contraction and abdominal bracing; reviewd information on vaginal moisturizers and lubricants; education on the pelvic floor anatomy    Person(s) Educated  Patient    Methods  Explanation;Demonstration;Verbal cues;Handout    Comprehension  Verbalized understanding;Returned demonstration       PT Short Term Goals - 10/10/17 1227      PT SHORT TERM GOAL #1   Title  independent with initial HEP including flexbility exericses and abdominal strength    Time  4    Period  Weeks    Status  New    Target Date  11/07/17      PT SHORT TERM GOAL #2   Title  ability to contract lower abdominals for abdominal bracing correctly    Time  4    Period  Weeks    Status  New    Target Date  11/07/17      PT SHORT TERM GOAL #3   Title  urinary leakage at night decreased >/= 25% when she is getting up out of bed    Time  4    Period  Weeks    Status  New    Target Date  11/07/17      PT SHORT TERM GOAL #4   Title  urinary leakage during the day decreased >/= 25% due to increased pelvic floor endurance    Time  4    Period  Weeks    Status  New    Target Date  11/07/17      PT SHORT TERM GOAL #5   Title  understand what bladder irritants are and how they affect the bladder and urinary leakage    Time  4    Period  Weeks    Status  New    Target Date  11/07/17        PT Long Term Goals - 10/10/17 1230      PT LONG TERM GOAL  #1   Title  independent with HEP and understand how to progress herself    Time  8    Period  Weeks    Status  New    Target Date  12/05/17      PT LONG TERM GOAL #2   Title  pain with intercourse decreased >/= 60% due to increased tissue mobility and understand how to use moisturizers for the vaginal area    Time  8    Period  Weeks    Status  New    Target Date  12/05/17      PT LONG TERM GOAL #3   Title  urinary leakage at night decreased >/= 75% due to increased pelvic floor strength and hip strength    Time  8    Period  Weeks    Status  New    Target Date  12/05/17      PT LONG TERM GOAL #4   Title  urinary leakage during the day decreased >/= 75% due to increased pelvic floor strength and hip strength    Time  8    Period  Weeks    Status  New    Target Date  12/05/17      PT LONG TERM GOAL #5   Title  ability to not wear a pad 50% of the time to reduce irritation of the vaginal area    Time  8    Period  Weeks    Status  New    Target Date  12/05/17            Plan - 10/17/17 1242    Clinical Impression Statement  Patient reports she has been able to get to the bathroom in time since last visit.  Patient understands vaginal moisturizers and lubricants after therapist reviewed the information.  Patient still has difficulty with abdominal bracing with lower abdominal contraction.  Patient will hold her breath with pelvic floor contraction.  Patient wil lbenefit from skilled therapy to to improve pelvic floor strength and coordination and increased functional mobility.  Rehab Potential  Excellent    PT Treatment/Interventions  Biofeedback;Neuromuscular re-education;Patient/family education;Therapeutic exercise;Therapeutic activities;Manual techniques    PT Next Visit Plan  internal soft tissue work; abdominal strength; scar massage; review HEP    PT Home Exercise Plan  hip stretches    Consulted and Agree with Plan of Care  Patient       Patient will  benefit from skilled therapeutic intervention in order to improve the following deficits and impairments:  Increased fascial restricitons, Decreased coordination, Decreased scar mobility, Increased muscle spasms, Decreased endurance, Decreased strength  Visit Diagnosis: Muscle weakness (generalized)  Unspecified lack of coordination     Problem List There are no active problems to display for this patient.   Earlie Counts, PT 10/17/17 12:45 PM   [facility] [address] [place], [place], [address] Phone: [phone]   Fax:  [phone]  Name: Allison Pierce MRN: [id number] Date of Birth: May 16, 1946

## 2017-10-24 ENCOUNTER — Encounter: Payer: Medicare Other | Admitting: Physical Therapy

## 2017-10-31 ENCOUNTER — Telehealth: Payer: Self-pay | Admitting: Physical Therapy

## 2017-10-31 ENCOUNTER — Ambulatory Visit: Payer: Medicare Other | Admitting: Physical Therapy

## 2017-10-31 NOTE — Telephone Encounter (Signed)
Called patient about her 11:45 appointment.  Patient reported she was sick and did not get a chance to cancel.  She is aware of her next appointment next Wednesday.  Earlie Counts, PT @1 /30/2019@ 12:20 PM

## 2017-11-07 ENCOUNTER — Ambulatory Visit: Payer: Medicare Other | Attending: Family Medicine | Admitting: Physical Therapy

## 2017-11-07 ENCOUNTER — Encounter: Payer: Self-pay | Admitting: Physical Therapy

## 2017-11-07 DIAGNOSIS — M6281 Muscle weakness (generalized): Secondary | ICD-10-CM | POA: Diagnosis not present

## 2017-11-07 DIAGNOSIS — R279 Unspecified lack of coordination: Secondary | ICD-10-CM | POA: Diagnosis not present

## 2017-11-07 NOTE — Therapy (Addendum)
[facility] [address] [place], [place], [address] Phone: [phone]   Fax:  [phone]  Physical Therapy Treatment  Patient Details  Name: Allison Pierce MRN: [id number] Date of Birth: Nov 27, 1945 Referring Provider: Dr. [name]   Encounter Date: 11/07/2017  PT End of Session - 11/07/17 1224    Visit Number  3    Date for PT Re-Evaluation  12/05/17    Authorization Type  Medicare    PT Start Time  1145    PT Stop Time  1225    PT Time Calculation (min)  40 min    Activity Tolerance  Patient tolerated treatment well    Behavior During Therapy  [facility] for tasks assessed/performed       Past Medical History:  Diagnosis Date  . Anxiety   . Depression   . GERD (gastroesophageal reflux disease) OCCASIONALLY  TAKE TUMS  . Hyperlipidemia   . Hypertension   . Vulvar lesion     Past Surgical History:  Procedure Laterality Date  . ABDOMINAL HYSTERECTOMY  1992   W/ SALPINGO-OOPHORECTOMY  . ANTERIOR CERVICAL DECOMP/DISCECTOMY FUSION  01-18-2009  DR POOLE   C4  - C6  . BREAST ENHANCEMENT SURGERY  2011  . BREAST SURGERY  02-04-2011  dr Ord--  CALCIFICATION  . VULVAR LESION REMOVAL  09/10/2012   Procedure: VULVAR LESION;  Surgeon: Selinda Orion, MD;  Location: [facility];  Service: Gynecology;  Laterality: N/A;  WIDE EXCISION OF VULVAR LESION    There were no vitals filed for this visit.  Subjective Assessment - 11/07/17 1146    Subjective  I not too bad.  I am doing better with not going to the bathroom at night.  I am squeezing my pelvic floor muscles.     Patient Stated Goals  reduce urinary leakage    Currently in Pain?  No/denies         [facility] PT Assessment - 11/07/17 0001      Palpation   SI assessment   pelvis in correct alignment                  [facility] Adult PT Treatment/Exercise - 11/07/17 0001      Manual Therapy   Manual  Therapy  Soft tissue mobilization;Myofascial release    Manual therapy comments  manually right quads,     Soft tissue mobilization  scar massage from hysterectomy; around umbilicus; right inner thigh, right quads, right hip flexor    Myofascial Release  release the bladder from the intestines, release the vaginal from the sac of douglas             PT Education - 11/07/17 1224    Education provided  Yes    Education Details  stretches and pelvic floor exercises    Person(s) Educated  Patient    Methods  Explanation;Demonstration;Verbal cues;Handout    Comprehension  Verbalized understanding;Returned demonstration       PT Short Term Goals - 11/07/17 1148      PT SHORT TERM GOAL #1   Title  independent with initial HEP including flexbility exericses and abdominal strength    Time  4    Period  Weeks    Status  On-going      PT SHORT TERM GOAL #2   Title  ability to contract lower abdominals for abdominal bracing correctly    Time  4  Period  Weeks    Status  On-going      PT SHORT TERM GOAL #3   Title  urinary leakage at night decreased >/= 25% when she is getting up out of bed    Time  4    Period  Weeks    Status  Achieved      PT SHORT TERM GOAL #4   Title  urinary leakage during the day decreased >/= 25% due to increased pelvic floor endurance    Time  4    Period  Weeks    Status  Achieved      PT SHORT TERM GOAL #5   Title  understand what bladder irritants are and how they affect the bladder and urinary leakage    Time  4    Period  Weeks    Status  Achieved        PT Long Term Goals - 10/10/17 1230      PT LONG TERM GOAL #1   Title  independent with HEP and understand how to progress herself    Time  8    Period  Weeks    Status  New    Target Date  12/05/17      PT LONG TERM GOAL #2   Title  pain with intercourse decreased >/= 60% due to increased tissue mobility and understand how to use moisturizers for the vaginal area    Time  8     Period  Weeks    Status  New    Target Date  12/05/17      PT LONG TERM GOAL #3   Title  urinary leakage at night decreased >/= 75% due to increased pelvic floor strength and hip strength    Time  8    Period  Weeks    Status  New    Target Date  12/05/17      PT LONG TERM GOAL #4   Title  urinary leakage during the day decreased >/= 75% due to increased pelvic floor strength and hip strength    Time  8    Period  Weeks    Status  New    Target Date  12/05/17      PT LONG TERM GOAL #5   Title  ability to not wear a pad 50% of the time to reduce irritation of the vaginal area    Time  8    Period  Weeks    Status  New    Target Date  12/05/17            Plan - 11/07/17 1149    Clinical Impression Statement  Patient has met her STG goals. Urinary leakage with nighttime and daytime is 25% better. Patient is not wearing a pad.  Patient is consistent with her HEP. Patient will start pelvic floor exercises in sitting.  Patient has increased scar mobility.  Patient had a cramp in her right quad and hip flexor during the exercises.  Patient has a sore on the lower buttocks area that is aggravating.  Patient will benefit from skilled therapy to improve pelvic floor strength to reduce urinary leakage.     Rehab Potential  Excellent    Clinical Impairments Affecting Rehab Potential  Hysterectomy 1995; Vulvar leision 09/10/2012    PT Frequency  1x / week    PT Duration  8 weeks    PT Treatment/Interventions  Biofeedback;Neuromuscular re-education;Patient/family education;Therapeutic exercise;Therapeutic activities;Manual techniques  PT Next Visit Plan  pelvic floor exercises in sitting; abdominal strength; urge to void    PT Home Exercise Plan  progress as needed    Consulted and Agree with Plan of Care  Patient       Patient will benefit from skilled therapeutic intervention in order to improve the following deficits and impairments:  Increased fascial restricitons, Decreased  coordination, Decreased scar mobility, Increased muscle spasms, Decreased endurance, Decreased strength  Visit Diagnosis: Muscle weakness (generalized)  Unspecified lack of coordination     Problem List There are no active problems to display for this patient.   Earlie Counts, PT 11/07/17 12:29 PM   [facility] [address] [place], [place], [address] Phone: [phone]   Fax:  [phone]  Name: Allison Pierce MRN: [id number] Date of Birth: Sep 07, 1946 PHYSICAL THERAPY DISCHARGE SUMMARY  Visits from Start of Care: 3  Current functional level related to goals / functional outcomes: See above. Patient called on 11/14/2017 to cancel her appointments and be discharged.  No reason given.    Remaining deficits: See above.    Education / Equipment: HEP Plan: Patient agrees to discharge.  Patient goals were not met. Patient is being discharged due to the patient's request. Thank you for the referral. Earlie Counts, PT 11/14/17 4:45 PM   ?????

## 2017-11-07 NOTE — Patient Instructions (Addendum)
Cat / Cow Flow    Inhale, press spine toward ceiling like a Halloween cat. Keeping strength in arms and abdominals, exhale to soften spine through neutral and into cow pose. Open chest and arch back. Initiate movement between cat and cow at tailbone, one vertebrae at a time. Repeat __15__ times.  Copyright  VHI. All rights reserved.  BACK: Child's Pose (Sciatica)    Sit in knee-chest position and reach arms forward. Separate knees for comfort. Hold position for _30__ breaths. Repeat _2__ times. Do _1__ times per day.  Copyright  VHI. All rights reserved.  Knee-to-Chest Stretch: Unilateral    With hand behind right knee, pull knee in to chest until a comfortable stretch is felt in lower back and buttocks. Keep back relaxed. Hold __30__ seconds. Repeat __1__ times per set. Do __1_ sets per session. Do ___1_ sessions per day.  http://orth.exer.us/126   Copyright  VHI. All rights reserved.  Piriformis Stretch - Supine    Pull uninvolved knee across body toward opposite shoulder. Hold slight stretch for _30__ seconds. Repeat with involved leg. Repeat _2__ times. Do __1_ times per day.  Copyright  VHI. All rights reserved.  Bracing With Bridging (Hook-Lying)    With neutral spine, tighten pelvic floor and abdominals and hold. Lift bottom. Repeat _10__ times. Do __1_ times a day.   Copyright  VHI. All rights reserved.  External Rotation: Hip - Knees Apart With Pelvic Floor (Hook-Lying)    Lie with hips and knees bent, band tied just above knees. Squeeze pelvic floor while pulling knees apart. Hold for _1__ seconds. Rest for _1__ seconds. Repeat __15_ times. Do __1_ times a day.   Copyright  VHI. All rights reserved.  [place] [address] [place], [place] [address] Phone # [phone] Fax [phone]

## 2017-11-19 ENCOUNTER — Other Ambulatory Visit: Payer: Self-pay | Admitting: Family Medicine

## 2017-11-19 DIAGNOSIS — Z0000 Encounter for general adult medical examination without abnormal findings: Secondary | ICD-10-CM | POA: Diagnosis not present

## 2017-11-19 DIAGNOSIS — Z1231 Encounter for screening mammogram for malignant neoplasm of breast: Secondary | ICD-10-CM

## 2017-11-19 DIAGNOSIS — M549 Dorsalgia, unspecified: Secondary | ICD-10-CM | POA: Diagnosis not present

## 2017-11-19 DIAGNOSIS — M899 Disorder of bone, unspecified: Secondary | ICD-10-CM | POA: Diagnosis not present

## 2017-11-19 DIAGNOSIS — F322 Major depressive disorder, single episode, severe without psychotic features: Secondary | ICD-10-CM | POA: Diagnosis not present

## 2017-11-19 DIAGNOSIS — I10 Essential (primary) hypertension: Secondary | ICD-10-CM | POA: Diagnosis not present

## 2017-11-19 DIAGNOSIS — R7301 Impaired fasting glucose: Secondary | ICD-10-CM | POA: Diagnosis not present

## 2017-11-19 DIAGNOSIS — N3281 Overactive bladder: Secondary | ICD-10-CM | POA: Diagnosis not present

## 2017-11-19 DIAGNOSIS — E782 Mixed hyperlipidemia: Secondary | ICD-10-CM | POA: Diagnosis not present

## 2017-11-19 DIAGNOSIS — G4700 Insomnia, unspecified: Secondary | ICD-10-CM | POA: Diagnosis not present

## 2017-11-19 DIAGNOSIS — J309 Allergic rhinitis, unspecified: Secondary | ICD-10-CM | POA: Diagnosis not present

## 2017-11-19 DIAGNOSIS — K589 Irritable bowel syndrome without diarrhea: Secondary | ICD-10-CM | POA: Diagnosis not present

## 2017-11-21 ENCOUNTER — Ambulatory Visit: Payer: Medicare Other

## 2017-12-07 ENCOUNTER — Ambulatory Visit
Admission: RE | Admit: 2017-12-07 | Discharge: 2017-12-07 | Disposition: A | Discharge status: Home / Self Care | Payer: Medicare Other | Source: Ambulatory Visit | Attending: Family Medicine | Admitting: Family Medicine

## 2017-12-07 DIAGNOSIS — Z1231 Encounter for screening mammogram for malignant neoplasm of breast: Secondary | ICD-10-CM

## 2018-01-07 DIAGNOSIS — M8588 Other specified disorders of bone density and structure, other site: Secondary | ICD-10-CM | POA: Diagnosis not present

## 2018-02-28 ENCOUNTER — Encounter: Payer: Self-pay | Admitting: Psychology

## 2018-03-26 ENCOUNTER — Other Ambulatory Visit: Payer: Self-pay | Admitting: Obstetrics & Gynecology

## 2018-03-26 DIAGNOSIS — N952 Postmenopausal atrophic vaginitis: Secondary | ICD-10-CM | POA: Diagnosis not present

## 2018-03-26 DIAGNOSIS — N890 Mild vaginal dysplasia: Secondary | ICD-10-CM | POA: Diagnosis not present

## 2018-04-11 ENCOUNTER — Other Ambulatory Visit: Payer: Self-pay | Admitting: Family Medicine

## 2018-04-11 DIAGNOSIS — N6459 Other signs and symptoms in breast: Secondary | ICD-10-CM

## 2018-04-16 ENCOUNTER — Ambulatory Visit
Admission: RE | Admit: 2018-04-16 | Discharge: 2018-04-16 | Disposition: A | Discharge status: Home / Self Care | Payer: Medicare Other | Source: Ambulatory Visit | Attending: Family Medicine | Admitting: Family Medicine

## 2018-04-16 DIAGNOSIS — N6459 Other signs and symptoms in breast: Secondary | ICD-10-CM

## 2018-04-16 DIAGNOSIS — N6489 Other specified disorders of breast: Secondary | ICD-10-CM | POA: Diagnosis not present

## 2018-04-16 DIAGNOSIS — R922 Inconclusive mammogram: Secondary | ICD-10-CM | POA: Diagnosis not present

## 2018-04-19 ENCOUNTER — Other Ambulatory Visit: Payer: Self-pay | Admitting: Family Medicine

## 2018-04-19 DIAGNOSIS — N6459 Other signs and symptoms in breast: Secondary | ICD-10-CM

## 2018-04-19 DIAGNOSIS — R922 Inconclusive mammogram: Secondary | ICD-10-CM

## 2018-04-29 ENCOUNTER — Ambulatory Visit
Admission: RE | Admit: 2018-04-29 | Discharge: 2018-04-29 | Disposition: A | Discharge status: Home / Self Care | Payer: Medicare Other | Source: Ambulatory Visit | Attending: Family Medicine | Admitting: Family Medicine

## 2018-04-29 ENCOUNTER — Other Ambulatory Visit: Payer: Medicare Other

## 2018-04-29 ENCOUNTER — Other Ambulatory Visit: Payer: Self-pay | Admitting: Family Medicine

## 2018-04-29 DIAGNOSIS — R922 Inconclusive mammogram: Secondary | ICD-10-CM

## 2018-04-29 DIAGNOSIS — N6002 Solitary cyst of left breast: Secondary | ICD-10-CM | POA: Diagnosis not present

## 2018-04-29 DIAGNOSIS — N6459 Other signs and symptoms in breast: Secondary | ICD-10-CM

## 2018-04-29 DIAGNOSIS — Z803 Family history of malignant neoplasm of breast: Secondary | ICD-10-CM | POA: Diagnosis not present

## 2018-04-29 MED ORDER — GADOBENATE DIMEGLUMINE 529 MG/ML IV SOLN
15.0000 mL | Freq: Once | INTRAVENOUS | Status: AC | PRN
  Administered 2018-04-29: 15 mL via INTRAVENOUS

## 2018-04-30 ENCOUNTER — Telehealth: Payer: Self-pay | Admitting: Nurse Practitioner

## 2018-04-30 NOTE — Telephone Encounter (Signed)
Phone call to patient to inform patient of 13 hr prep instructions. Pt verbalized understanding. Prep called into [facility] pharmacy at [place].  7:40 pm (7/31) 50mg  Prednisone 1:40 am (8/1)   50mg  Prednisone 7:40 am (8/1)   50mg  Prednisone & 50mg  Benadryl

## 2018-05-02 ENCOUNTER — Ambulatory Visit
Admission: RE | Admit: 2018-05-02 | Discharge: 2018-05-02 | Disposition: A | Discharge status: Home / Self Care | Payer: Medicare Other | Source: Ambulatory Visit | Attending: Family Medicine | Admitting: Family Medicine

## 2018-05-02 DIAGNOSIS — R922 Inconclusive mammogram: Secondary | ICD-10-CM

## 2018-05-02 DIAGNOSIS — N6459 Other signs and symptoms in breast: Secondary | ICD-10-CM

## 2018-06-27 DIAGNOSIS — N952 Postmenopausal atrophic vaginitis: Secondary | ICD-10-CM | POA: Diagnosis not present

## 2018-06-27 DIAGNOSIS — N890 Mild vaginal dysplasia: Secondary | ICD-10-CM | POA: Diagnosis not present
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
## 2018-07-29 ENCOUNTER — Encounter: Payer: Self-pay | Admitting: Psychology

## 2018-07-29 ENCOUNTER — Ambulatory Visit: Payer: Medicare Other | Admitting: Psychology

## 2018-07-29 ENCOUNTER — Ambulatory Visit (INDEPENDENT_AMBULATORY_CARE_PROVIDER_SITE_OTHER): Payer: Medicare Other | Admitting: Psychology

## 2018-07-29 DIAGNOSIS — R413 Other amnesia: Secondary | ICD-10-CM

## 2018-07-29 NOTE — Progress Notes (Signed)
   Neuropsychology Note  Allison Pierce completed 60 minutes of neuropsychological testing with technician, Milana Kidney, BS, under the supervision of Dr. Macarthur Critchley, Licensed Psychologist. The patient did not appear overtly distressed by the testing session, per behavioral observation or via self-report to the technician. Rest breaks were offered.   Clinical Decision Making: In considering the patient's current level of functioning, level of presumed impairment, nature of symptoms, emotional and behavioral responses during the interview, level of literacy, and observed level of motivation/effort, a battery of tests was selected and communicated to the psychometrician.  Communication between the psychologist and technician was ongoing throughout the testing session and changes were made as deemed necessary based on patient performance on testing, technician observations and additional pertinent factors such as those listed above.  Allison Pierce will return within approximately 2 weeks for an interactive feedback session with Dr. Si Raider at which time her test performances, clinical impressions and treatment recommendations will be reviewed in detail. The patient understands she can contact our office should she require our assistance before this time.  35 minutes spent performing neuropsychological evaluation services/clinical decision making (psychologist). [CPT 23557] 60 minutes spent face-to-face with patient administering standardized tests, 30 minutes spent scoring (technician). [CPT [id number], 32202]  Full report to follow.

## 2018-08-19 NOTE — Progress Notes (Signed)
NEUROPSYCHOLOGICAL EVALUATION   Name:    Allison Pierce  Date of Birth:   [birth year] Date of Interview:  07/29/2018 Date of Testing:  07/29/2018   Date of Feedback:  08/20/2018       Background Information:  Reason for Referral:  Allison Pierce is a 72 y.o. female referred by Dr. [name] of [facility] to assess her current level of cognitive functioning and assist in differential diagnosis. The current evaluation consisted of a review of available medical records, an interview with the patient, and the completion of a neuropsychological testing battery. Informed consent was obtained.  History of Presenting Problem:  Allison Pierce reports gradual onset of memory difficulty approximately a year ago. She noted that about a year ago her husband started having heart problems and was diagnosed with AFib and required multiple shocks. Since then, she reports he is "different"- specifically, he gets mad at her very easily and puts her down. There has not been any physical violence. The patient reports depressed mood over the past year, possibly related to this situation with her husband. She reports she has less interest in things, which is unusual for her. She feels down "all the time". She feels worthless. She is constantly tired. She reports loss of libido. She admits to history of alcohol abuse, stating she was drinking heavily for about a year but cut back around August of this year. She says that at most she will have 2 drinks now. However at other times she describes occasions in the past few months where she had "several" drinks and was intoxicated. She notes a family history of alcoholism in her mother and brother (brother has been sober for many years after completing rehab).   Current cognitive symptoms endorsed by the patient include: forgetfulness for recent conversations/events, being told she is repeating herself, some misplacing/losing of items, some errors with bill  paying, and difficulty with / uncertainty about driving directions. She also endorses difficulty concentrating and distractibility but notes these have been issues "all my life". She reports she had learning difficulties as a child and required additional assistance to learn to read.   The patient continues to manage instrumental ADLs. She denies any recent MVAs. She denies trouble with managing her medications or appointments. She manages the bills/finances and states she has made some errors. She does some meal preparation and denies any problems doing this.  The patient has a family history of dementia in her father. She recalls it being a rather abrupt onset of symptoms, and the family thought he may have had a stroke. However he refused medical evaluation. Symptoms did progress over time. He was in his early 56s when he demonstrated onset of dementia.  Physically, the patient complains of chronic back pain. She denies taking any pain medication. She complains of newer onset of nightmares, sweats, and severe muscle cramps since starting Rosuvastatin. When asked about balance/falls, the patient states that in August of this year she was at a family reunion all day and it was very hot out and she had been drinking alcohol. That night, she woke up on the couch and tried to walk to the bedroom and had severe cramping to the point that she couldn't walk. She fell and then started throwing up and having diarrhea. She did not want to go to the hospital. Her husband got her to the commode and she passed out.   I also see from her Epic records that she was in  the ED on 10/04/2017 after hitting her head in the context of falling in the bathroom. Per notes, she had diarrhea and fell three times trying to get to the toilet. She apparently did not lose consciousness. Head CT did not reveal any acute abnormality, per notes. Of note, during our interview today, the patient did not recollect this event or having gone  to the ED.  Epic records also include MRI brain from 11/2012 which revealed mild to moderate subcortical and periventricular white matter signal abnormality also involving the brainstem and cerebellum to a slight degree; findings consistent with chronic microvascular ischemic change.  The patient has taken medication for depression for a long time, including bupropion, trazodone, and sertraline. She saw psychiatry for a long time in the past but didn't feel it was helpful. She has never had counseling and does not see how it could be helpful, stating, "I don't know what to tell them other than what I've told you and Dr. Brigitte Pulse." She denies current suicidal ideation or intention. She states, "I want to live, but I do wonder why I'm here, what I'm good for." She endorses sadness related to being less close to her son since he remarried. She denies any sleep difficulty. She denies daily alcohol consumption, stating she drinks "sometimes a couple times a day, sometimes not". She denies recreational drug use. She noted that a few months ago she was at an "outdoor function" with friends and she had "several drinks", was intoxicated and "blew up" on a friend, apparently yelling at her for a long time. She does not remember the details and cannot understand why she would do that. She states this is not her typical behavior.    Social History: Born/Raised: [place] Education: High school  She reported learning difficulties as a child and was slow to learn to read. Occupational history: Retired. She worked for [employer] for 35 years. Marital history: She has been with her current husband for about 30 years. She was divorced from her first husband. Children: She has one son from her previous marriage. She and her son are not very close since he remarried about 10 years ago. The patient has 3 grandchildren, and sees the older 2 regularly (ages 47 and 6). Alcohol: As discussed above Tobacco: Never a smoker or tobacco  user SA: Denied   Medical History:  Past Medical History:  Diagnosis Date  . Anxiety   . Depression   . GERD (gastroesophageal reflux disease) OCCASIONALLY  TAKE TUMS  . Hyperlipidemia   . Hypertension   . Vulvar lesion     Current medications:  (Patient brought in her medication pill bottles) Rosuvastatin 10 mg Meloxicam 15 mg Trazodone 150 mg (2 tablets at bedtime) Sertraline - she has an Rx for 100 mg and an Rx for 200 mg and doesn't know why. She states she typically takes 1 1/2 100mg  tablets per day. Bupropion 300 mg daily "Juice +" vitamins   Current Examination:  Behavioral Observations:  Appearance: Casually dressed and appropriately groomed Gait: Ambulated independently, no gross abnormalities observed Speech: Fluent; normal rate, rhythm and volume. No significant word finding difficulty. Thought process: Mildly tangential Affect: Full range, mildly anxious Interpersonal: Pleasant, appropriate Orientation: Oriented to all spheres. Accurately named the current President and his predecessor.   Tests Administered: . Test of Premorbid Functioning (TOPF) . Wechsler Adult Intelligence Scale-Fourth Edition (WAIS-IV): Similarities, Music therapist, Coding and Digit Span subtests . Wechsler Memory Scale-Fourth Edition (WMS-IV) Older Adult Version (ages 32-90): Logical Memory  I, II and Recognition subtests  . [employer] Verbal Learning Test - 2nd Edition (CVLT-2) Short Form . Repeatable Battery for the Assessment of Neuropsychological Status (RBANS) Form A:  Figure Copy and Recall subtests and Semantic Fluency subtest . Boston Naming Test (BNT) . Boston Diagnostic Aphasia Examination: Complex Ideational Material subtest . Controlled Oral Word Association Test (COWAT) . Trail Making Test A and B . Clock drawing test . Beck Depression Inventory - 2nd Edition (BDI-II) . Generalized Anxiety Disorder - 7 item screener (GAD-7)  Test Results: Note: Standardized scores are  presented only for use by appropriately trained professionals and to allow for any future test-retest comparison. These scores should not be interpreted without consideration of all the information that is contained in the rest of the report. The most recent standardization samples from the test publisher or other sources were used whenever possible to derive standard scores; scores were corrected for age, gender, ethnicity and education when available.   Test Scores:  Test Name Raw Score Standardized Score Descriptor  TOPF 21/70 SS= 81 Low average  WAIS-IV Subtests     Similarities 10/36 ss= 4 Impaired  Block Design 30/66 ss= 10 Average  Coding 46/135 ss= 9 Average  Digit Span Forward 8/16 ss= 8 Low end of average  Digit Span Backward 5/16 ss= 6 Low average  WMS-IV Subtests     LM I 21/53 ss= 6 Low average  LM II 12/39 ss= 8 Low end of average  LM II Recognition 17/23 Cum %: 26-50   RBANS Subtests     Figure Copy 17/20 Z= -0.4 Average  Figure Recall 9/20 Z= -0.8 Low average  Semantic Fluency 19 Z= -0.2 Average  CVLT-II Scores     Trial 1 1/9 Z= -3 Severely impaired  Trial 4 5/9 Z= -2 Impaired  Trials 1-4 total 15/36 T= 24 Severely impaired  SD Free Recall 5/9 Z= -1 Low average  LD Free Recall 5/9 Z= -0.5 Average  LD Cued Recall 6/9 Z= -0.5 Average  Recognition Discriminability 7/9 hits 5 false positives Z= -1.5 Borderline  Forced Choice Recognition 9/9  WNL  BNT 45/60 T= 39 Low average  BDAE Complex Ideational Material 9/12  Impaired  COWAT-FAS 32 T= 47 Average  COWAT-Animals 18 T= 54 Average  Trail Making Test A  37" 1 error T= 53 Average  Trail Making Test B  171" 4 errors T= 42 Low average  Clock Drawing   Impaired  BDI-II 36/63  Severe  GAD-7 18/21  Severe      Description of Test Results:  Premorbid verbal intellectual abilities were estimated to have been within the low average range based on a test of word reading.   Psychomotor processing speed was average.    Auditory attention and working memory were low average.   Visual-spatial construction was average.   Language abilities were variable. Specifically, confrontation naming was low average, and semantic verbal fluency was average. Auditory comprehension of complex ideational material was impaired.   With regard to verbal memory, encoding and acquisition of non-contextual information (i.e., word list) was severely impaired. After a brief distracter task, free recall was low average (5/9 items). After a delay, free recall was average with 100% retention of previously encoded information (5/9 items). Cued recall was average (6/9 items). However, she did demonstrate increased intrusion errors across recall trials (impaired range). Performance on a yes/no recognition task was borderline impaired, with a high number of intrusion errors. On another verbal memory test, encoding and acquisition  of contextual auditory information (i.e., short stories) was low average. After a delay, free recall was low end of average. Performance on a yes/no recognition task was mildly below expectation. With regard to non-verbal memory, delayed free recall of visual information was low average.   Executive functioning was variable. On a test of mental flexibility and set-shifting (Trails B), the patient's time to complete the task was low average, but it should be noted that she committed four set loss errors. Verbal fluency with phonemic search restrictions was average. Verbal abstract reasoning was impaired. Performance on a clock drawing task was impaired.   On a self-report measure of mood, the patient's responses were indicative of clinically significant depression at the present time. Symptoms endorsed included: sadness, pessimism, feelings of failure, anhedonia, guilty feelings, punishment feelings, self-dislike, self-criticalness, tearfulness, restlessness, loss of interest, indecisiveness, worthlessness, loss of energy,  hypersomnia, irritability, reduced appetite, concentration difficulty, fatigue and loss of libido. She denied suicidal ideation or intention. On a self-report measure of anxiety, the patient endorsed clinically significant generalized anxiety characterized by nervousness, inability to control worrying, excessive worries, restlessness, irritability and fear of something awful happening.    Clinical Impressions: Mild cognitive impairment; severe depression and anxiety; alcohol use disorder. On objective testing of cognitive function, the patient demonstrated deficits in executive function, attention and memory encoding (although retrieval/recognition was relatively intact). She appears to be managing complex ADLs without any difficulty and therefore does not meet criteria for a dementia syndrome. 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 Her cognitive profile is strongly suggestive of frontal-subcortical dysfunction. An old brain MRI showed chronic microvascular ischemia, and her alcohol use puts her at increased risk for cerebral micro-hemorrhages. As such, a vascular etiology of her MCI is suspected. The patient admits to a history of problem drinking until recent months, but even in recent months she admits she has drank to intoxication somewhat regularly. I suspect the patient's alcohol use is having both acute and chronic impacts on cognitive function (causing amnesia during intoxication, and causing chronic executive dysfunction due to small vessel disease).  The patient is also reporting severe levels of depression and anxiety, but no suicidal ideation/intention. Mood and stress are very likely exacerbating cognitive dysfunction.    Recommendations/Plan: Based on the findings of the present evaluation, the following recommendations are offered:  1. Consider updated brain MRI. 2. Consider referral to psychiatry for consultation to review medications. 3. I highly recommended that the patient  participate in counseling to address depression/anxiety/alcohol/marital stress. She agreed to a referral to [facility]. I will send the referral and they will let her know if they have any Medicare providers. 4. She is encouraged to minimize or discontinue alcohol use, in order to promote brain health and reduce the risk of alcohol related dementia. 5. To promote brain health and overall wellbeing, she should regularly engage in activities that provide mental stimulation, social interaction and cardiovascular exercise. 6. She was provided with written information on behavioral strategies to enhance cognitive function in daily life.   Feedback to Patient: Allison Pierce returned for a feedback appointment on 08/20/2018 to review the results of her neuropsychological evaluation with this provider. 25 minutes face-to-face time was spent reviewing her test results, my impressions and my recommendations as detailed above.    Total time spent on this patient's case: 80 minutes for neurobehavioral status exam with psychologist (CPT code [phone]); 90 minutes of testing/scoring by psychometrician under psychologist's supervision (CPT codes [phone], [phone] units); 180 minutes  for integration of patient data, interpretation of standardized test results and clinical data, clinical decision making, treatment planning and preparation of this report, and interactive feedback with review of results to the patient/family by psychologist (CPT codes [phone], [phone] units).      Thank you for your referral of Allison Pierce. Please feel free to contact me if you have any questions or concerns regarding this report.

## 2018-08-20 ENCOUNTER — Ambulatory Visit (INDEPENDENT_AMBULATORY_CARE_PROVIDER_SITE_OTHER): Payer: Medicare Other | Admitting: Psychology

## 2018-08-20 ENCOUNTER — Encounter: Payer: Self-pay | Admitting: Psychology

## 2018-08-20 DIAGNOSIS — F322 Major depressive disorder, single episode, severe without psychotic features: Secondary | ICD-10-CM

## 2018-08-20 DIAGNOSIS — G3184 Mild cognitive impairment, so stated: Secondary | ICD-10-CM

## 2018-08-20 DIAGNOSIS — F1010 Alcohol abuse, uncomplicated: Secondary | ICD-10-CM

## 2018-08-20 NOTE — Patient Instructions (Signed)
Fortunately, test results and level of functioning are NOT consistent with dementia.   However, you did have some areas of difficulty on testing that we could consider "mild cognitive impairment".   This is likely due to changes in the white matter of the brain, due to reduced efficiency of the small blood vessels in the brain. This is considered vascular. It can be due to having high blood pressure, high cholesterol, and alcohol use.  In order to reduce the risk of this worsening, optimal control of hypertension/cholesterol is encouraged, as is minimizing or stopping alcohol use. Additionally, getting regular cardiovascular exercise can be helpful.   You also seem to be experiencing a very high level of depression and stress. I highly recommend consultation with a psychiatrist to review your medications and see if any changes need to be made. Additionally, I highly recommend working with a counselor to treat depression/anxiety/alcohol issues and perhaps to address marital stress as well.   Depression, anxiety and stress can worsen cognitive functioning. Attached is some more information about this.  Below is information pertaining to strategies you can use in everyday life to enhance cognitive function.  Strategies to enhance cognitive functioning Attention, concentration, memory encoding and consolidation    . Make a plan and be prepared o If you find that you are more attentive at certain times of the day (i.e., the morning), plan important activities and discussions at that time o Determine which activities take the most time and which are most important, then prioritize your "to do list" based on this information o Break tasks into simpler parts, understand the steps involved before starting o Rehearse the steps mentally or write them down. If you write them down, you can use this as a checklist to check off as you complete them. o Visualize completing the task  . Use external aids   o Write everything down that you do not need to know or work with right now. Don't store extra information in your brain that you don't need right now.  o Use a calendar or planner to make checklists, due dates and "to do" lists. o Use ONLY ONE calendar or planner and look at it frequently o Set alarms for important deadlines or appointments  . Minimize interruptions and distractions  o Find a good work environment, e.g., quiet room with a desk, close curtains, use earplugs, mask sounds with a fan or white noise machine o Turn off cell phone and/or email alerts during important tasks. In fact, it is helpful to schedule a block of time each day where you limit phone and email interruptions and focus on just the more detailed work you have. o Try to minimize the amount of background noise (i.e., television, music) when engaged in important tasks or conversations with others (note that some individuals find soft background music helpful in minimize distraction, so you may need to experiment with optimal level of noise for specific situations)  . Use active effort = consciously attending to details, closely analyzing o Failures of encoding may reflect failure to attend to one's own actions o Be prepared to work more slowly than you usually do  o When reading, allow time for re-reading sections  o Check your work for errors  . Avoid multitasking o Do not attempt to complete more than one task at one time. Focus on one task until it is completed and then move on to the next one. o Avoid other activities while engaged in important tasks, such  as talking on the phone while balancing the checkbook, making a shopping list during a meeting.   . Use self-talk during tasks o Repeat the steps of the activity to yourself as you complete them o Talk to yourself about your progress o This helps you maintain focus on the task and makes it easier to remember completing the task (Similar to "active effort"  above)  . Conserve energy o Conserve energy to avoid fatigue and its effects on cognition - Get enough sleep - Pace yourself  and make sure to take breaks - Be open to receiving help - Exercise for increased energy  . Conversational vigilance = paying attention during a conversation  o Listen actively: focus on the speaker and position yourself so that you can clearly hear the him/her, and have open/relaxed posture  o Eye contact: Maintaining eye contact with the person you are speaking with may increase the likelihood that important information is properly received  o Ask questions: Ask questions for clarification (e.g., request that the speaker explain something in a different way) or ask for information to be repeated if you become distracted, or if you do not hear or understand something during a conversation  o Paraphrase: Summarize or repeat back important information from a conversation in your own words to facilitate communication and ensure that you have heard correctly and understand

## 2018-09-12 ENCOUNTER — Encounter: Payer: Medicare Other | Admitting: Psychology

## 2018-10-07 ENCOUNTER — Ambulatory Visit: Payer: Medicare Other | Admitting: Psychology

## 2018-11-11 DIAGNOSIS — R05 Cough: Secondary | ICD-10-CM | POA: Diagnosis not present

## 2018-11-11 DIAGNOSIS — R0981 Nasal congestion: Secondary | ICD-10-CM | POA: Diagnosis not present

## 2018-12-10 DIAGNOSIS — K5900 Constipation, unspecified: Secondary | ICD-10-CM | POA: Diagnosis not present

## 2018-12-10 DIAGNOSIS — N941 Unspecified dyspareunia: Secondary | ICD-10-CM | POA: Diagnosis not present

## 2018-12-10 DIAGNOSIS — L739 Follicular disorder, unspecified: Secondary | ICD-10-CM | POA: Diagnosis not present

## 2018-12-10 DIAGNOSIS — N3944 Nocturnal enuresis: Secondary | ICD-10-CM | POA: Diagnosis not present

## 2018-12-10 DIAGNOSIS — N3946 Mixed incontinence: Secondary | ICD-10-CM | POA: Diagnosis not present

## 2019-01-22 ENCOUNTER — Other Ambulatory Visit: Payer: Self-pay | Admitting: Family Medicine

## 2019-01-22 DIAGNOSIS — Z1231 Encounter for screening mammogram for malignant neoplasm of breast: Secondary | ICD-10-CM

## 2019-03-20 ENCOUNTER — Ambulatory Visit
Admission: RE | Admit: 2019-03-20 | Discharge: 2019-03-20 | Disposition: A | Discharge status: Home / Self Care | Payer: Medicare Other | Source: Ambulatory Visit | Attending: Family Medicine | Admitting: Family Medicine

## 2019-03-20 ENCOUNTER — Other Ambulatory Visit: Payer: Self-pay

## 2019-03-20 DIAGNOSIS — Z1231 Encounter for screening mammogram for malignant neoplasm of breast: Secondary | ICD-10-CM | POA: Diagnosis not present

## 2019-03-20 DIAGNOSIS — N9412 Deep dyspareunia: Secondary | ICD-10-CM | POA: Diagnosis not present

## 2019-03-20 DIAGNOSIS — N3946 Mixed incontinence: Secondary | ICD-10-CM | POA: Diagnosis not present

## 2019-03-20 DIAGNOSIS — N952 Postmenopausal atrophic vaginitis: Secondary | ICD-10-CM | POA: Diagnosis not present

## 2019-03-20 DIAGNOSIS — N890 Mild vaginal dysplasia: Secondary | ICD-10-CM | POA: Diagnosis not present

## 2019-03-26 DIAGNOSIS — R5383 Other fatigue: Secondary | ICD-10-CM | POA: Diagnosis not present

## 2019-04-17 ENCOUNTER — Other Ambulatory Visit: Payer: Self-pay | Admitting: Family Medicine

## 2019-04-17 DIAGNOSIS — R55 Syncope and collapse: Secondary | ICD-10-CM

## 2019-04-17 DIAGNOSIS — R5382 Chronic fatigue, unspecified: Secondary | ICD-10-CM | POA: Diagnosis not present

## 2019-04-17 DIAGNOSIS — R159 Full incontinence of feces: Secondary | ICD-10-CM | POA: Diagnosis not present

## 2019-05-10 ENCOUNTER — Ambulatory Visit
Admission: RE | Admit: 2019-05-10 | Discharge: 2019-05-10 | Disposition: A | Discharge status: Home / Self Care | Payer: Medicare Other | Source: Ambulatory Visit | Attending: Family Medicine | Admitting: Family Medicine

## 2019-05-10 ENCOUNTER — Other Ambulatory Visit: Payer: Self-pay

## 2019-05-10 ENCOUNTER — Other Ambulatory Visit: Payer: Self-pay | Admitting: Family Medicine

## 2019-05-10 DIAGNOSIS — R55 Syncope and collapse: Secondary | ICD-10-CM | POA: Diagnosis not present

## 2019-06-02 ENCOUNTER — Other Ambulatory Visit: Payer: Self-pay

## 2019-06-02 ENCOUNTER — Ambulatory Visit (INDEPENDENT_AMBULATORY_CARE_PROVIDER_SITE_OTHER): Payer: Medicare Other | Admitting: Neurology

## 2019-06-02 ENCOUNTER — Encounter: Payer: Self-pay | Admitting: Neurology

## 2019-06-02 VITALS — BP 124/74 | HR 76 | Ht 66.0 in | Wt 160.0 lb

## 2019-06-02 DIAGNOSIS — R55 Syncope and collapse: Secondary | ICD-10-CM

## 2019-06-02 DIAGNOSIS — R32 Unspecified urinary incontinence: Secondary | ICD-10-CM

## 2019-06-02 DIAGNOSIS — R42 Dizziness and giddiness: Secondary | ICD-10-CM

## 2019-06-02 DIAGNOSIS — M549 Dorsalgia, unspecified: Secondary | ICD-10-CM | POA: Diagnosis not present

## 2019-06-02 DIAGNOSIS — R159 Full incontinence of feces: Secondary | ICD-10-CM | POA: Diagnosis not present

## 2019-06-02 DIAGNOSIS — R7301 Impaired fasting glucose: Secondary | ICD-10-CM | POA: Diagnosis not present

## 2019-06-02 DIAGNOSIS — E782 Mixed hyperlipidemia: Secondary | ICD-10-CM | POA: Diagnosis not present

## 2019-06-02 NOTE — Patient Instructions (Addendum)
1. Schedule routine EEG. Stop by out front desk today and schedule this please.  2. Schedule 2-week holter monitor. This will be scheduled by [facility]. They will contact you with an appointment date and time. If they have not contacted you within two weeks. Please call us back.  3. Schedule lumbar MRI without contrast. [facility] will call you to schedule this appointment. If you would like to call them to schedule their number is [phone].  4. Refer for Vestibular therapy for dizziness. This will be scheduled with [facility]. They will contact you with an appointment date and time. If you do not have an appointment scheduled within 2 weeks please call us back.  5. As per [place] driving laws, for any episode of loss of consciousness, on driving until 6 months event-free  6. Follow-up in 4-5 months, call for any changes

## 2019-06-02 NOTE — Progress Notes (Signed)
NEUROLOGY CONSULTATION NOTE  Allison Pierce MRN: [id number] DOB: [birth year]  Referring provider: Dr. Serita Grammes Primary care provider: Dr. Serita Grammes  Reason for consult:  Recurrent syncope  Dear Dr Brigitte Pulse:  Thank you for your kind referral of Allison Pierce for consultation of the above symptoms. Although her history is well known to you, please allow me to reiterate it for the purpose of our medical record. She is alone in the office today. Records and images were personally reviewed where available.  HISTORY OF PRESENT ILLNESS: This is a pleasant 73 year old right-handed woman with a history of hyperlipidemia, hypertension, ADHD, mild cognitive impairment, presenting for evaluation of recurrent syncope. The first episode occurred in September 2019, she reports she was overheated in the day and felt fine, then that evening around 2 hours later as she was getting ready to go to bed, she started feeling weak and went down to the floor, her husband heard a sound, she told him she was not feeling fell and he helped her to the bathroom where she passed out then had vomiting and diarrhea. She woke up to her husband screaming at her to talk to him. Her husband helped her clean up and she fell asleep. She continued to feel tired and off balance for a few days after. She had another episode Christmas 2019, she got up from the den to go to the bedroom then she passed out and hit the tile, her face was black and blue. There was no vomiting and diarrhea this time. She had another episode in January, and again in July. She reports they have occurred if she gets very hot or overheated in the day, then that evening after midnight she would have the same symptoms with associated vomiting and diarrhea. With the most recent one, she got up to use the bathroom and her husband could tell she was going and caught her. There is no prior warning, she wakes up with her husband cleaning her up. Her husband has not  mentioned any jerking or convulsive activity. She would be diaphoretic when she wakes up and states that she is not passed out for a long period of time. No focal weakness, she just wants to lay down and sleep.   For the past several months, she feels dizzy "all the time," worse when turning fast. She has neck and back pain. No headaches, diplopia, dysarthria/dysphagia. She has had fecal incontinence for the past 6-7 months and wears adult diapers, she is unaware she had a BM. This is not daily, it occurred last night and a couple of weeks ago. She has urinary incontinence at night but not during the day. She denies any chest pain, shortness of breath, or palpitations. No staring episodes, olfactory/gustatory hallucinations, rising epigastric sensation, myoclonic jerks. She had a normal birth and early development.  There is no history of febrile convulsions, CNS infections such as meningitis/encephalitis, significant traumatic brain injury, neurosurgical procedures, or family history of seizures.  I personally reviewed MRI brain without contrast done 05/2019 which did not show any acute changes, there was mild diffuse atrophy and mild to moderate chronic microvascular disease.  PAST MEDICAL HISTORY: Past Medical History:  Diagnosis Date   Anxiety    Depression    GERD (gastroesophageal reflux disease) OCCASIONALLY  TAKE TUMS   Hyperlipidemia    Hypertension    Vulvar lesion     PAST SURGICAL HISTORY: Past Surgical History:  Procedure Laterality Date   ABDOMINAL HYSTERECTOMY  1992   W/ SALPINGO-OOPHORECTOMY   ANTERIOR CERVICAL DECOMP/DISCECTOMY FUSION  01-18-2009  DR POOLE   C4  - C6   AUGMENTATION MAMMAPLASTY Bilateral    BREAST BIOPSY Right    BREAST ENHANCEMENT SURGERY  2011   BREAST EXCISIONAL BIOPSY Left    BREAST SURGERY  02-04-2011  dr Margot Chimes   EXCISION LEFT BREAST MASS--  CALCIFICATION   VULVAR LESION REMOVAL  09/10/2012   Procedure: VULVAR LESION;  Surgeon:  Selinda Orion, MD;  Location: [facility];  Service: Gynecology;  Laterality: N/A;  WIDE EXCISION OF VULVAR LESION    MEDICATIONS: Current Outpatient Medications on File Prior to Visit  Medication Sig Dispense Refill   aspirin 81 MG tablet Take 81 mg by mouth daily.     atorvastatin (LIPITOR) 40 MG tablet Take 40 mg by mouth every morning.     buPROPion (WELLBUTRIN XL) 300 MG 24 hr tablet Take 300 mg by mouth every morning.     calcium carbonate (TUMS - DOSED IN MG ELEMENTAL CALCIUM) 500 MG chewable tablet Chew 1 tablet by mouth as needed.     celecoxib (CELEBREX) 200 MG capsule Take 200 mg by mouth every morning.     clonazePAM (KLONOPIN) 1 MG tablet Take 1 mg by mouth at bedtime.     fluticasone (FLONASE) 50 MCG/ACT nasal spray Place into both nostrils daily.     lisinopril (PRINIVIL,ZESTRIL) 10 MG tablet Take 10 mg by mouth every morning.     losartan (COZAAR) 25 MG tablet Take 25 mg by mouth daily.     meloxicam (MOBIC) 15 MG tablet Take 15 mg by mouth daily.     sertraline (ZOLOFT) 100 MG tablet Take 200 mg by mouth every evening.     simvastatin (ZOCOR) 20 MG tablet Take 20 mg by mouth daily.     TRAZODONE HCL PO Take 200 mg by mouth at bedtime.     No current facility-administered medications on file prior to visit.     ALLERGIES: Allergies  Allergen Reactions   Gadolinium Derivatives Itching and Other (See Comments)    Redness and itching, skins feels like it was burning. In the future pt will need pre med.   Lyrica [Pregabalin]     "FELT WEIRD"   Prozac [Fluoxetine Hcl] Other (See Comments)    "COULDN'T LIFT HEAD AND GET OUT OF BED"    FAMILY HISTORY: Family History  Problem Relation Age of Onset   Breast cancer Cousin    Breast cancer Cousin     SOCIAL HISTORY: Social History   Socioeconomic History   Marital status: Married    Spouse name: Not on file   Number of children: Not on file   Years of education: Not on file     Highest education level: Not on file  Occupational History   Not on file  Social Needs   Financial resource strain: Not on file   Food insecurity    Worry: Not on file    Inability: Not on file   Transportation needs    Medical: Not on file    Non-medical: Not on file  Tobacco Use   Smoking status: Never Smoker   Smokeless tobacco: Never Used  Substance and Sexual Activity   Alcohol use: Yes    Alcohol/week: 7.0 standard drinks    Types: 7 Cans of beer per week   Drug use: Not on file   Sexual activity: Not on file  Lifestyle   Physical activity  Days per week: Not on file    Minutes per session: Not on file   Stress: Not on file  Relationships   Social connections    Talks on phone: Not on file    Gets together: Not on file    Attends religious service: Not on file    Active member of club or organization: Not on file    Attends meetings of clubs or organizations: Not on file    Relationship status: Not on file   Intimate partner violence    Fear of current or ex partner: Not on file    Emotionally abused: Not on file    Physically abused: Not on file    Forced sexual activity: Not on file  Other Topics Concern   Not on file  Social History Narrative   Graduated HS      Right handed      Lives with husband    REVIEW OF SYSTEMS: Constitutional: No fevers, chills, or sweats, no generalized fatigue, change in appetite Eyes: No visual changes, double vision, eye pain Ear, nose and throat: No hearing loss, ear pain, nasal congestion, sore throat Cardiovascular: No chest pain, palpitations Respiratory:  No shortness of breath at rest or with exertion, wheezes GastrointestinaI: No nausea, vomiting, diarrhea, abdominal pain, fecal incontinence Genitourinary:  No dysuria, urinary retention or frequency Musculoskeletal:  + neck pain, back pain Integumentary: No rash, pruritus, skin lesions Neurological: as above Psychiatric: No depression,  insomnia, anxiety Endocrine: No palpitations, fatigue, diaphoresis, mood swings, change in appetite, change in weight, increased thirst Hematologic/Lymphatic:  No anemia, purpura, petechiae. Allergic/Immunologic: no itchy/runny eyes, nasal congestion, recent allergic reactions, rashes  PHYSICAL EXAM: Vitals:   06/02/19 0914  BP: 124/74  Pulse: 76  SpO2: 98%   General: No acute distress Head:  Normocephalic/atraumatic Skin/Extremities: No rash, no edema Neurological Exam: Mental status: alert and oriented to person, place, and time, no dysarthria or aphasia, Fund of knowledge is appropriate.  Recent and remote memory are intact.  Attention and concentration are normal.    Able to name objects and repeat phrases. Cranial nerves: CN I: not tested CN II: pupils equal, round and reactive to light, visual fields intact CN III, IV, VI:  full range of motion, no nystagmus, no ptosis CN V: facial sensation intact CN VII: upper and lower face symmetric CN VIII: hearing intact to conversation CN IX, X: gag intact, uvula midline CN XI: sternocleidomastoid and trapezius muscles intact CN XII: tongue midline Bulk & Tone: normal, no fasciculations. Motor: 5/5 throughout with no pronator drift. Sensation: intact to light touch, cold, pin. Decreased vibration to left ankle. No extinction to double simultaneous stimulation.  Romberg test negative Deep Tendon Reflexes: +1 throughout except for absent ankle jerks bilaterally, no ankle clonus Plantar responses: downgoing bilaterally Cerebellar: no incoordination on finger to nose Gait: narrow-based and steady, difficulty with tandem walk Tremor: none  IMPRESSION: This is a pleasant 73 year old right-handed woman with a history of hyperlipidemia, hypertension, ADHD, mild cognitive impairment, presenting for evaluation of recurrent syncope. Etiology of symptoms unclear, syncope versus seizure. The vomiting and diarrhea are unusual for seizure. MRI  brain without contrast unremarkable. A 1-hour EEG will be done, holter monitor will be ordered as well. She has bowel and bladder incontinence, MRI lumbar spine without contrast will be ordered to assess for underlying structural abnormality. She will be referred for vestibular therapy for the positional dizziness. [place] driving laws were discussed, no driving after an episode of  loss of consciousness/awareness, until 6 months event-free. Follow-up in 4-5 months, she knows to call for any changes.   Thank you for allowing me to participate in the care of this patient. Please do not hesitate to call for any questions or concerns.   Ellouise Newer, M.D.  CC: Dr. Brigitte Pulse

## 2019-06-06 DIAGNOSIS — Z23 Encounter for immunization: Secondary | ICD-10-CM | POA: Diagnosis not present

## 2019-06-06 DIAGNOSIS — R197 Diarrhea, unspecified: Secondary | ICD-10-CM | POA: Diagnosis not present

## 2019-06-11 ENCOUNTER — Ambulatory Visit (INDEPENDENT_AMBULATORY_CARE_PROVIDER_SITE_OTHER): Payer: Medicare Other | Admitting: Neurology

## 2019-06-11 ENCOUNTER — Other Ambulatory Visit: Payer: Self-pay

## 2019-06-11 ENCOUNTER — Encounter: Payer: Self-pay | Admitting: Neurology

## 2019-06-11 DIAGNOSIS — R55 Syncope and collapse: Secondary | ICD-10-CM

## 2019-06-11 DIAGNOSIS — R42 Dizziness and giddiness: Secondary | ICD-10-CM

## 2019-06-23 ENCOUNTER — Telehealth: Payer: Self-pay | Admitting: *Deleted

## 2019-06-23 NOTE — Telephone Encounter (Signed)
Opened in error

## 2019-06-23 NOTE — Procedures (Signed)
ELECTROENCEPHALOGRAM REPORT  Date of Study: 06/11/2019  Patient's Name: Allison Pierce MRN: [id number] Date of Birth: 01-Apr-1946  Referring Provider: Dr. Ellouise Newer  Clinical History: This is a 73 year old woman with recurrent episodes of loss of consciousness, dizziness. EEG for classification.  Medications: aspirin 81 MG tablet LIPITOR 40 MG tablet WELLBUTRIN XL 300 MG 24 hr tablet TUMS - DOSED IN MG ELEMENTAL CALCIUM) 500 MG CELEBREX 200 MG capsule KLONOPIN 1 MG tablet FLONASE 50 MCG/ACT nasal spray PRINIVIL,ZESTRIL 10 MG tablet COZAAR 25 MG tablet MOBI 15 MG tablet ZOLOFT 100 MG tablet ZOCOR 20 MG tablet TRAZODONE HCL PO  Technical Summary: A multichannel digital 1-hour EEG recording measured by the international 10-20 system with electrodes applied with paste and impedances below 5000 ohms performed in our laboratory with EKG monitoring in an awake and asleep patient.  Hyperventilation was not performed. Photic stimulation was performed.  The digital EEG was referentially recorded, reformatted, and digitally filtered in a variety of bipolar and referential montages for optimal display.    Description: The patient is awake and asleep during the recording.  During maximal wakefulness, there is a poorly sustained symmetric, medium voltage 9-10 Hz posterior dominant rhythm that poorly attenuates with eye opening.  The record is symmetric.  During drowsiness and sleep, there is an increase in theta slowing of the background.  Vertex waves and symmetric sleep spindles were seen.  Photic stimulation did not elicit any abnormalities.  There were no epileptiform discharges or electrographic seizures seen.    EKG lead was unremarkable.  Impression: This 1-hour awake and asleep EEG is normal.    Clinical Correlation: A normal EEG does not exclude a clinical diagnosis of epilepsy.  If further clinical questions remain, prolonged EEG may be helpful.  Clinical correlation is  advised.   Ellouise Newer, M.D.

## 2019-06-23 NOTE — Telephone Encounter (Addendum)
Called and let patient know EEG was normal. She has not scheduled Holter Monitor yet.  Note order was put in computer on 8/31  for 2 week holter monitor at [facility].   Called heart care at [phone] and spoke to [place]. She sees order and they will do a pre auth then call patient to schedule. She has all she needs for this order.   Patient called back and made aware that heart care will be calling to set up. Verbalizes understanding.

## 2019-06-23 NOTE — Telephone Encounter (Signed)
-----   Message from Cameron Sprang, MD sent at 06/23/2019 10:21 AM EDT ----- Pls let her know the EEG was normal. Has she scheduled the holter monitor yet? Thanks

## 2019-06-27 ENCOUNTER — Other Ambulatory Visit: Payer: Medicare Other

## 2019-07-03 ENCOUNTER — Ambulatory Visit
Admission: RE | Admit: 2019-07-03 | Discharge: 2019-07-03 | Disposition: A | Discharge status: Home / Self Care | Payer: Medicare Other | Source: Ambulatory Visit | Attending: Neurology | Admitting: Neurology

## 2019-07-03 ENCOUNTER — Other Ambulatory Visit: Payer: Self-pay

## 2019-07-03 DIAGNOSIS — R159 Full incontinence of feces: Secondary | ICD-10-CM

## 2019-07-03 DIAGNOSIS — M48061 Spinal stenosis, lumbar region without neurogenic claudication: Secondary | ICD-10-CM | POA: Diagnosis not present

## 2019-07-03 DIAGNOSIS — R32 Unspecified urinary incontinence: Secondary | ICD-10-CM

## 2019-07-03 DIAGNOSIS — M549 Dorsalgia, unspecified: Secondary | ICD-10-CM

## 2019-07-07 ENCOUNTER — Telehealth: Payer: Self-pay

## 2019-07-07 ENCOUNTER — Ambulatory Visit: Payer: Medicare Other | Attending: Physical Therapy | Admitting: Physical Therapy

## 2019-07-07 NOTE — Telephone Encounter (Signed)
Pt informed of MRI Results and made aware to follow up with PCP, urologist or GI about incontinence. Pt stated that she was supposed to have some type of therapy and she forgot when the appt was.  The appt for vestibular therapy was today. Pt given their number to call and reschedule.

## 2019-07-07 NOTE — Telephone Encounter (Signed)
-----   Message from Cameron Sprang, MD sent at 07/07/2019 12:54 PM EDT ----- Pls let her know that the MRI lumbar spine did not show any spinal cord compression to explain her incontinence, recommend continued follow-up with PCP, urology, and GI for these conditions. Thanks

## 2019-08-19 DIAGNOSIS — R197 Diarrhea, unspecified: Secondary | ICD-10-CM | POA: Diagnosis not present

## 2019-08-19 DIAGNOSIS — R195 Other fecal abnormalities: Secondary | ICD-10-CM | POA: Diagnosis not present

## 2019-09-09 DIAGNOSIS — H524 Presbyopia: Secondary | ICD-10-CM | POA: Diagnosis not present

## 2019-09-09 DIAGNOSIS — Z961 Presence of intraocular lens: Secondary | ICD-10-CM | POA: Diagnosis not present

## 2019-09-10 DIAGNOSIS — Z1159 Encounter for screening for other viral diseases: Secondary | ICD-10-CM | POA: Diagnosis not present

## 2019-10-06 ENCOUNTER — Telehealth: Payer: Self-pay

## 2019-10-06 ENCOUNTER — Other Ambulatory Visit: Payer: Self-pay

## 2019-10-06 ENCOUNTER — Encounter: Payer: Self-pay | Admitting: Neurology

## 2019-10-06 ENCOUNTER — Telehealth (INDEPENDENT_AMBULATORY_CARE_PROVIDER_SITE_OTHER): Payer: Medicare Other | Admitting: Neurology

## 2019-10-06 VITALS — Ht 66.0 in | Wt 163.0 lb

## 2019-10-06 DIAGNOSIS — R55 Syncope and collapse: Secondary | ICD-10-CM

## 2019-10-06 NOTE — Progress Notes (Signed)
Virtual Visit via Video Note The purpose of this virtual visit is to provide medical care while limiting exposure to the novel coronavirus.    Consent was obtained for video visit:  Yes.   Answered questions that patient had about telehealth interaction:  Yes.   I discussed the limitations, risks, security and privacy concerns of performing an evaluation and management service by telemedicine. I also discussed with the patient that there may be a patient responsible charge related to this service. The patient expressed understanding and agreed to proceed.  Pt location: Home/patient's hairdresser's home Physician Location: office Name of referring provider:  Arta Silence, MD I connected with Allison Pierce at patients initiation/request on 10/06/2019 at  1:30 PM EST by video enabled telemedicine application and verified that I am speaking with the correct person using two identifiers. Pt MRN:  [id number] Pt DOB:  10/06/1945 Video Participants:  Allison Pierce   History of Present Illness:  The patient was seen as a virtual video visit on 10/06/2019. She was last seen 4 months ago for recurrent syncope. Some of them have been followed by vomiting and diarrhea. She reports passing out in 06/2018, 09/2018, 10/2018, 04/2019, and most recently in November 2020. She was about to get Covid-19 testing in preparation for a colonoscopy, as she started walking back to the clinic, she lost consciousness briefly, stating she was "out as quick as I went out." No tongue bite or incontinence, no report of convulsive activity. She had to go home and go to bed after due to diffuse weakness. She continues to have dizzy spells, she is sitting at her hairdresser's home today getting a haircut and reports feeling dizzy. She was unable to do vestibular therapy previously ordered. She denies any nausea/vomiting. She has occasional left arm numbness, no chest pain, shortness of breath. No headaches, diplopia. No other  falls.   History on Initial Assessment 06/02/2019: This is a pleasant 74 year old right-handed woman with a history of hyperlipidemia, hypertension, ADHD, mild cognitive impairment, presenting for evaluation of recurrent syncope. The first episode occurred in September 2019, she reports she was overheated in the day and felt fine, then that evening around 2 hours later as she was getting ready to go to bed, she started feeling weak and went down to the floor, her husband heard a sound, she told him she was not feeling well and he helped her to the bathroom where she passed out then had vomiting and diarrhea. She woke up to her husband screaming at her to talk to him. Her husband helped her clean up and she fell asleep. She continued to feel tired and off balance for a few days after. She had another episode Christmas 2019, she got up from the den to go to the bedroom then she passed out and hit the tile, her face was black and blue. There was no vomiting and diarrhea this time. She had another episode in January, and again in July. She reports they have occurred if she gets very hot or overheated in the day, then that evening after midnight she would have the same symptoms with associated vomiting and diarrhea. With the most recent one, she got up to use the bathroom and her husband could tell she was going and caught her. There is no prior warning, she wakes up with her husband cleaning her up. Her husband has not mentioned any jerking or convulsive activity. She would be diaphoretic when she wakes up and states  that she is not passed out for a long period of time. No focal weakness, she just wants to lay down and sleep.   For the past several months, she feels dizzy "all the time," worse when turning fast. She has neck and back pain. No headaches, diplopia, dysarthria/dysphagia. She has had fecal incontinence for the past 6-7 months and wears adult diapers, she is unaware she had a BM. This is not daily, it  occurred last night and a couple of weeks ago. She has urinary incontinence at night but not during the day. She denies any chest pain, shortness of breath, or palpitations. No staring episodes, olfactory/gustatory hallucinations, rising epigastric sensation, myoclonic jerks. She had a normal birth and early development.  There is no history of febrile convulsions, CNS infections such as meningitis/encephalitis, significant traumatic brain injury, neurosurgical procedures, or family history of seizures.  I personally reviewed MRI brain without contrast done 05/2019 which did not show any acute changes, there was mild diffuse atrophy and mild to moderate chronic microvascular disease.    Current Outpatient Medications on File Prior to Visit  Medication Sig Dispense Refill  . atorvastatin (LIPITOR) 40 MG tablet Take 40 mg by mouth every morning.    Marland Kitchen buPROPion (WELLBUTRIN XL) 300 MG 24 hr tablet Take 300 mg by mouth every morning.    . calcium carbonate (TUMS - DOSED IN MG ELEMENTAL CALCIUM) 500 MG chewable tablet Chew 1 tablet by mouth as needed.    . celecoxib (CELEBREX) 200 MG capsule Take 200 mg by mouth every morning.    . clonazePAM (KLONOPIN) 1 MG tablet Take 1 mg by mouth at bedtime.    . fluticasone (FLONASE) 50 MCG/ACT nasal spray Place into both nostrils daily.    Marland Kitchen lisinopril (PRINIVIL,ZESTRIL) 10 MG tablet Take 10 mg by mouth every morning.    Marland Kitchen losartan (COZAAR) 25 MG tablet Take 25 mg by mouth daily.    . meloxicam (MOBIC) 15 MG tablet Take 15 mg by mouth daily.    . simvastatin (ZOCOR) 20 MG tablet Take 20 mg by mouth daily.    . TRAZODONE HCL PO Take 200 mg by mouth at bedtime.    Marland Kitchen aspirin 81 MG tablet Take 81 mg by mouth daily.    . sertraline (ZOLOFT) 100 MG tablet Take 200 mg by mouth every evening.     No current facility-administered medications on file prior to visit.     Observations/Objective:   Vitals:   10/06/19 1318  Weight: 163 lb (73.9 kg)  Height: 5\' 6"   (1.676 m)   GEN:  The patient appears stated age and is in NAD.  Neurological examination: Patient is awake, alert, oriented x 3. No aphasia or dysarthria. Intact fluency and comprehension. Remote and recent memory intact. Able to name and repeat. Cranial nerves: Extraocular movements intact with no nystagmus. No facial asymmetry. Motor: moves all extremities symmetrically, at least anti-gravity x 4.    Assessment and Plan:   This is a pleasant 74 yo RH woman with a history of hyperlipidemia, hypertension, ADHD, mild cognitive impairment, with recurrent syncope, etiology unclear. MRI brain and EEG unremarkable. She had another episode as she was about to get Covid-19 testing last November, with transient loss of consciousness. Possibly vasovagal, she reports dizziness as well. Refer to Cardiology for recurrent syncope. From a neurological standpoint, we will do a 24-hour EEG for completion to assess for any epileptiform activity. Consider vestibular therapy is vertigo persists. She is aware of [place]  driving loss to stop driving after an episode of loss of consciousness/awareness, until 6 months event-free. Follow-up in 4-5 months, she knows to call for any changes.    Follow Up Instructions:   -I discussed the assessment and treatment plan with the patient. The patient was provided an opportunity to ask questions and all were answered. The patient agreed with the plan and demonstrated an understanding of the instructions.   The patient was advised to call back or seek an in-person evaluation if the symptoms worsen or if the condition fails to improve as anticipated.   Cameron Sprang, MD

## 2019-10-06 NOTE — Telephone Encounter (Signed)
Pt called no answer voice mail left for her to return call

## 2019-10-08 ENCOUNTER — Other Ambulatory Visit: Payer: Self-pay

## 2019-10-08 ENCOUNTER — Telehealth: Payer: Self-pay | Admitting: Radiology

## 2019-10-08 ENCOUNTER — Encounter: Payer: Self-pay | Admitting: Cardiology

## 2019-10-08 ENCOUNTER — Ambulatory Visit (INDEPENDENT_AMBULATORY_CARE_PROVIDER_SITE_OTHER): Payer: Medicare Other | Admitting: Cardiology

## 2019-10-08 VITALS — Temp 97.3°F | Ht 66.0 in | Wt 169.6 lb

## 2019-10-08 DIAGNOSIS — R55 Syncope and collapse: Secondary | ICD-10-CM

## 2019-10-08 DIAGNOSIS — Z7189 Other specified counseling: Secondary | ICD-10-CM

## 2019-10-08 DIAGNOSIS — I951 Orthostatic hypotension: Secondary | ICD-10-CM

## 2019-10-08 NOTE — Telephone Encounter (Signed)
Enrolled patient for a 14 day Zio monitor to be mailed to patients home.  

## 2019-10-08 NOTE — Progress Notes (Signed)
Cardiology Office Note:    Date:  10/08/2019   ID:  Allison Pierce, DOB [birth year], MRN [id number]  PCP:  Allison Neer, MD  Cardiologist:  Allison Dresser, MD  Referring MD: Allison Sprang, MD   CC: new patient evaluation for syncope.  History of Present Illness:    Allison Pierce is a 74 y.o. female with a hx of hyperlipidemia, anxiety/depression who is seen as a new consult at the request of Allison Lesch Lezlie Octave, MD for the evaluation and management of syncope.  Syncope: Initial episode: started 06/2018. Had gone to a family reunion, was in the heat all day. At home was trying to get to the bathroom, and passed out. She was having nausea, vomiting, and diarrhea at the same time. Had another episode about a year ago in her kitchen, fell and hit herself on the ceramic floor. Had another episode when she went to Allison Pierce office for a Covid test. As she was walking in, felt lightheaded and passed out walking into the office. Was supposed to get a colonoscopy for bowel incontinence, but needs to be cleared by cardiology and neurology first.  Frequency: several presyncopal episodes. Has had multiple episodes of bowel incontinence, rare bladder incontinence. These occur even without the syncopal episodes Duration of episodes: husband says she did not respond at all for 5-10 minutes with first episode. Gradually started moaning but was not coherent for some time. Had loss of bowel/bladder and vomiting, so she was in emesis/stool/urine. Did not call EMS, went to bed. Presyncopal symptoms: gets lightheaded but only about a second or less before she blacks out Post syncope symptoms: feels very tired. Denies confusion, focal neuro deficits (though feels "wobbly") Aggravating/alleviating factors: none that she knows of. Has been drinking less alcohol, which seems to have helped. Had been drinking vodka, "a lot," going through a half gallon bottle of vodka per week. Now drinking beer, about  4/night, and feels better.  Prior workup: MRI brain, EEG  Denies chest pain, shortness of breath at rest or with normal exertion. No PND, orthopnea, LE edema or unexpected weight gain. No palpitations.  Past Medical History:  Diagnosis Date  . Anxiety   . Depression   . GERD (gastroesophageal reflux disease) OCCASIONALLY  TAKE TUMS  . Hyperlipidemia   . Hypertension   . Vulvar lesion     Past Surgical History:  Procedure Laterality Date  . ABDOMINAL HYSTERECTOMY  1992   W/ SALPINGO-OOPHORECTOMY  . ANTERIOR CERVICAL DECOMP/DISCECTOMY FUSION  01-18-2009  Allison Pierce   C4  - C6  . AUGMENTATION MAMMAPLASTY Bilateral   . BREAST BIOPSY Right   . BREAST ENHANCEMENT SURGERY  2011  . BREAST EXCISIONAL BIOPSY Left   . BREAST SURGERY  02-04-2011  Allison Allison Pierce--  CALCIFICATION  . VULVAR LESION REMOVAL  09/10/2012   Procedure: VULVAR LESION;  Surgeon: Allison Orion, MD;  Location: [facility];  Service: Gynecology;  Laterality: N/A;  WIDE EXCISION OF VULVAR LESION    Current Medications: Current Outpatient Medications on File Prior to Visit  Medication Sig  . aspirin 81 MG tablet Take 81 mg by mouth daily.  Marland Kitchen atorvastatin (LIPITOR) 40 MG tablet Take 40 mg by mouth every morning.  Marland Kitchen buPROPion (WELLBUTRIN XL) 300 MG 24 hr tablet Take 300 mg by mouth every morning.  . calcium carbonate (TUMS - DOSED IN MG ELEMENTAL CALCIUM) 500 MG chewable tablet Chew 1 tablet by mouth as needed.  Marland Kitchen  celecoxib (CELEBREX) 200 MG capsule Take 200 mg by mouth every morning.  . clonazePAM (KLONOPIN) 1 MG tablet Take 1 mg by mouth at bedtime.  . fluticasone (FLONASE) 50 MCG/ACT nasal spray Place into both nostrils daily.  Marland Kitchen lisinopril (PRINIVIL,ZESTRIL) 10 MG tablet Take 10 mg by mouth every morning.  Marland Kitchen losartan (COZAAR) 25 MG tablet Take 25 mg by mouth daily.  . meloxicam (MOBIC) 15 MG tablet Take 15 mg by mouth daily.  . sertraline (ZOLOFT) 100 MG tablet Take 200 mg by  mouth every evening.  . simvastatin (ZOCOR) 20 MG tablet Take 20 mg by mouth daily.  . TRAZODONE HCL PO Take 200 mg by mouth at bedtime.   No current facility-administered medications on file prior to visit.     Allergies:   Gadolinium derivatives, Lyrica [pregabalin], and Prozac [fluoxetine hcl]   Social History   Tobacco Use  . Smoking status: Never Smoker  . Smokeless tobacco: Never Used  Substance Use Topics  . Alcohol use: Yes    Alcohol/week: 7.0 standard drinks    Types: 7 Cans of beer per week  . Drug use: Not on file    Family History: family history includes Breast cancer in her cousin and cousin.  ROS:   Please see the history of present illness.  Additional pertinent ROS: Constitutional: Negative for chills, fever, night sweats, unintentional weight loss  HENT: Negative for ear pain and hearing loss.   Eyes: Negative for loss of vision and eye pain.  Respiratory: Negative for cough, sputum, wheezing.   Cardiovascular: See HPI. Gastrointestinal: Negative for abdominal pain, melena, and hematochezia.  Genitourinary: Negative for dysuria and hematuria.  Musculoskeletal: Negative for falls and myalgias.  Skin: Negative for itching and rash.  Neurological: Negative for focal weakness, focal sensory changes and loss of consciousness.  Endo/Heme/Allergies: Does not bruise/bleed easily.     EKGs/Labs/Other Studies Reviewed:    The following studies were reviewed today: No prior studies   EKG:  EKG is personally reviewed.  The ekg ordered today demonstrates NSR  Recent Labs: No results found for requested labs within last 8760 hours.  Recent Lipid Panel No results found for: CHOL, TRIG, HDL, CHOLHDL, VLDL, LDLCALC, LDLDIRECT  Physical Exam:    VS:  Temp (!) 97.3 F (36.3 C)   Ht 5\' 6"  (1.676 m)   Wt 169 lb 9.6 oz (76.9 kg)   SpO2 98%   BMI 27.37 kg/m     Orthostatics: Lying 131/73 Sitting 150/75 Standing 129/79  Wt Readings from Last 3 Encounters:    10/08/19 169 lb 9.6 oz (76.9 kg)  10/06/19 163 lb (73.9 kg)  06/02/19 160 lb (72.6 kg)    GEN: Well nourished, well developed in no acute distress HEENT: Normal, moist mucous membranes NECK: No JVD CARDIAC: regular rhythm, normal S1 and S2, no rubs or gallops. No murmurs. VASCULAR: Radial and DP pulses 2+ bilaterally. No carotid bruits RESPIRATORY:  Clear to auscultation without rales, wheezing or rhonchi  ABDOMEN: Soft, non-tender, non-distended MUSCULOSKELETAL:  Ambulates independently SKIN: Warm and dry, no edema NEUROLOGIC:  Alert and oriented x 3. No focal neuro deficits noted. PSYCHIATRIC:  Normal affect    ASSESSMENT:    1. Syncope, unspecified syncope type   2. Cardiac risk counseling   3. Counseling on health promotion and disease prevention    PLAN:    Syncope: unclear etiology, though does have several confounding factors such as loss of bowel/bladder control. Neuro workup unrevealing. Has improved cutting back  on alcohol -echo to rule out structural cause -event monitor to rule out arrhythmia -if these studies are unremarkable, then low suspicion for cardiac cause. -I did discuss the [place] DMV medical guidelines for driving: "it is prudent to recommend that all persons should be free of syncopal episodes for at least six months to be granted the driving privilege." ([place], Second Edition, Medical Review Branch, Engineer, site, Division of [employer] of Transportation, July 2004)  Orthostatic hypotension -she is just on the abnormal side of orthostatic on standing  Counseled on the following: -avoid dehydration. Often it requires high volumes of fluids, often with salt/electrolytes included, to stay hydrated. People with orthostasis are very sensitive to fluid shifts and dehydration. Oral rehydration is preferred, and routine use of IV fluids is not recommended. -if  tolerated, compression stocking can assist with fluid management and prevent pooling in the legs. -slow position changes are recommended -if there is a feeling of severe lightheadedness, like near to passing out, recommend lying on the floor on the back, with legs elevated up on a chair or up against the wall. -the best long term management is gradual exercise conditioning. I recommend seated exercises such as bike to start, to avoid the risk of falling with lightheadedness. Exercise programs, either through supervised programs like cardiac rehab or through personal programs, should focus on gradually increasing exercise tolerance and conditioning.   Cardiac risk counseling and prevention recommendations: -recommend heart healthy/Mediterranean diet, with whole grains, fruits, vegetable, fish, lean meats, nuts, and olive oil. Limit salt. -recommend moderate walking, 3-5 times/week for 30-50 minutes each session. Aim for at least 150 minutes.week. Goal should be pace of 3 miles/hours, or walking 1.5 miles in 30 minutes -recommend avoidance of tobacco products. Avoid excess alcohol. -on rosuvastatin for prevention, last LDL 92  Plan for follow up: TBD based on results of testing. If testing unremarkable, can follow up as needed  Allison Dresser, MD, PhD [place]  [facility]    Medication Adjustments/Labs and Tests Ordered: Current medicines are reviewed at length with the patient today.  Concerns regarding medicines are outlined above.  Orders Placed This Encounter  Procedures  . LONG TERM MONITOR (3-14 DAYS)  . EKG 12-Lead  . ECHOCARDIOGRAM COMPLETE   No orders of the defined types were placed in this encounter.   Patient Instructions  Medication Instructions:  Your Physician recommend you continue on your current medication as directed.    *If you need a refill on your cardiac medications before your next appointment, please call your pharmacy*  Lab  Work: None  Testing/Procedures: Your physician has requested that you have an echocardiogram. Echocardiography is a painless test that uses sound waves to create images of your heart. It provides your doctor with information about the size and shape of your heart and how well your heart's chambers and valves are working. This procedure takes approximately one hour. There are no restrictions for this procedure. [place] [address]  Our physician has recommended that you wear an 14  DAY ZIO-PATCH monitor. The Zio patch cardiac monitor continuously records heart rhythm data for up to 14 days, this is for patients being evaluated for multiple types heart rhythms. For the first 24 hours post application, please avoid getting the Zio monitor wet in the shower or by excessive sweating during exercise. After that, feel free to carry on with regular activities. Keep soaps and lotions away  from the ZIO XT Patch.   Someone will call you to mail monitor     Follow-Up: At [facility], you and your health needs are our priority.  As part of our continuing mission to provide you with exceptional heart care, we have created designated Provider Care Teams.  These Care Teams include your primary Cardiologist (physician) and Advanced Practice Providers (APPs -  Physician Assistants and Nurse Practitioners) who all work together to provide you with the care you need, when you need it.  Your next appointment:   Based off test results  The format for your next appointment:   Either In Person or Virtual  Provider:   Buford Dresser, MD     Signed, Allison Dresser, MD PhD 10/08/2019     [place]

## 2019-10-08 NOTE — Patient Instructions (Signed)
Medication Instructions:  Your Physician recommend you continue on your current medication as directed.    *If you need a refill on your cardiac medications before your next appointment, please call your pharmacy*  Lab Work: None  Testing/Procedures: Your physician has requested that you have an echocardiogram. Echocardiography is a painless test that uses sound waves to create images of your heart. It provides your doctor with information about the size and shape of your heart and how well your heart's chambers and valves are working. This procedure takes approximately one hour. There are no restrictions for this procedure. [place] [address]  Our physician has recommended that you wear an 14  DAY ZIO-PATCH monitor. The Zio patch cardiac monitor continuously records heart rhythm data for up to 14 days, this is for patients being evaluated for multiple types heart rhythms. For the first 24 hours post application, please avoid getting the Zio monitor wet in the shower or by excessive sweating during exercise. After that, feel free to carry on with regular activities. Keep soaps and lotions away from the ZIO XT Patch.   Someone will call you to mail monitor     Follow-Up: At [facility], you and your health needs are our priority.  As part of our continuing mission to provide you with exceptional heart care, we have created designated Provider Care Teams.  These Care Teams include your primary Cardiologist (physician) and Advanced Practice Providers (APPs -  Physician Assistants and Nurse Practitioners) who all work together to provide you with the care you need, when you need it.  Your next appointment:   Based off test results  The format for your next appointment:   Either In Person or Virtual  Provider:   Buford Dresser, MD

## 2019-10-09 ENCOUNTER — Ambulatory Visit (HOSPITAL_COMMUNITY): Payer: Medicare Other | Attending: Cardiology

## 2019-10-09 DIAGNOSIS — R55 Syncope and collapse: Secondary | ICD-10-CM | POA: Diagnosis not present

## 2019-10-10 ENCOUNTER — Telehealth: Payer: Self-pay | Admitting: Cardiology

## 2019-10-10 NOTE — Telephone Encounter (Signed)
Allison Dresser, MD 10/10/2019 10:06 AM EST Echo is normal--squeeze is normal, valves are normal. Nothing to suggest that there is anything structurally wrong with the heart. We will see what the monitor shows when it returns.   LM2CB (NO DPR)-WENT STRAIGHT TO VM

## 2019-10-10 NOTE — Telephone Encounter (Signed)
  Patient is returning call for her echo results

## 2019-10-13 ENCOUNTER — Encounter: Payer: Self-pay | Admitting: Cardiology

## 2019-10-13 NOTE — Telephone Encounter (Signed)
Pt notified of ECHO results. She also states that she has not received the monitor. She states that she will check the mail and will CB if not received by Friday.

## 2019-10-14 ENCOUNTER — Telehealth: Payer: Self-pay | Admitting: Cardiology

## 2019-10-14 NOTE — Telephone Encounter (Signed)
Pt verbalized understanding of the results of her Echo and currently has her monitor now. Will call back if she has any questions or problems.

## 2019-10-14 NOTE — Telephone Encounter (Signed)
Patient is returning phone call regarding Echo results.

## 2019-10-16 ENCOUNTER — Ambulatory Visit (INDEPENDENT_AMBULATORY_CARE_PROVIDER_SITE_OTHER): Payer: Medicare Other

## 2019-10-16 DIAGNOSIS — R55 Syncope and collapse: Secondary | ICD-10-CM | POA: Diagnosis not present

## 2019-10-31 ENCOUNTER — Ambulatory Visit: Payer: Medicare Other | Admitting: Neurology

## 2019-11-10 DIAGNOSIS — R55 Syncope and collapse: Secondary | ICD-10-CM | POA: Diagnosis not present

## 2019-11-24 ENCOUNTER — Telehealth: Payer: Self-pay | Admitting: Neurology

## 2019-11-24 NOTE — Telephone Encounter (Signed)
Patient called regarding her heart monitor results. This was due to her passing out. She is needing to get her results so she can schedule her Colonoscopy. Please Call. Thank you

## 2019-11-25 ENCOUNTER — Telehealth: Payer: Self-pay

## 2019-11-25 ENCOUNTER — Telehealth: Payer: Self-pay | Admitting: *Deleted

## 2019-11-25 NOTE — Telephone Encounter (Signed)
Pt called no answer voice mail for her to return call

## 2019-11-25 NOTE — Telephone Encounter (Signed)
Dr. Harrell Gave, I saw your note on Allison Pierce but wanted to clarify if she should proceed with colonoscopy?   Thanks.

## 2019-11-25 NOTE — Telephone Encounter (Signed)
Pt called informed that she needs to get who is doing her colonoscopy to send the clearance to her cardiologist. PT verbalized understanding

## 2019-11-25 NOTE — Telephone Encounter (Signed)
   [facility] Pre-operative Risk Assessment    Request for surgical clearance:  1. What type of surgery is being performed? colonoscopy   2. When is this surgery scheduled? TBD   3. What type of clearance is required (medical clearance vs. Pharmacy clearance to hold med vs. Both)? medical  4. Are there any medications that need to be held prior to surgery and how long? No  5. Practice name and name of physician performing surgery? [facility]-Dr. Paulita Fujita   6. What is your office phone number [phone]    7.   What is your office fax number [phone]  8.   Anesthesia type (None, local, MAC, general) ? propofol   Marriana Hibberd A Tanmay Halteman 11/25/2019, 1:48 PM  _________________________________________________________________

## 2019-11-25 NOTE — Telephone Encounter (Signed)
Please ask them to fax what they need to our office for clearance. The prior note does state that based on her monitor results, she is cleared from a cardiac perspective. If they need additional documentation they should send it to the office. Thanks.

## 2019-11-25 NOTE — Telephone Encounter (Signed)
   Primary Cardiologist: Buford Dresser, MD  Chart reviewed as part of pre-operative protocol coverage. Given past medical history and time since last visit, based on ACC/AHA guidelines, Allison Pierce would be at acceptable risk for the planned procedure without further cardiovascular testing.   Cardiology has seen for syncope but workup reveals no cardiac issues.    I will route this recommendation to the requesting party via Epic fax function and remove from pre-op pool.  Please call with questions.  Cecilie Kicks, NP 11/25/2019, 5:28 PM

## 2019-11-25 NOTE — Telephone Encounter (Signed)
Looks like the Cardiology office called her about the results on 2/12:  This is what the cardiologist said: "There were a number of brief but fast rhythms noted, but interestingly these did not line up with symptoms. Nothing that clearly would cause her to pass out. It does not appear that the spell she had was from her rhythm. If these brief but fast heart rhythms are not bothersome, we do not need to treat them, but if they are bothersome in the future we can try a low dose medication to treat them"

## 2019-11-25 NOTE — Telephone Encounter (Signed)
Pt called stated that she needs a clearance from cardiology for her colonoscopy. Message was sent to cardiology

## 2019-11-25 NOTE — Telephone Encounter (Signed)
Yes, she is cleared. No cardiac etiology for her syncope, no contraindication from a cardiac perspective.

## 2019-11-27 ENCOUNTER — Ambulatory Visit: Payer: Medicare Other | Attending: Internal Medicine

## 2019-11-27 DIAGNOSIS — Z23 Encounter for immunization: Secondary | ICD-10-CM | POA: Insufficient documentation

## 2019-11-27 NOTE — Progress Notes (Signed)
   Covid-19 Vaccination Clinic  Name:  Allison Pierce    MRN: [id number] DOB: [birth year]  11/27/2019  Allison Pierce was observed post Covid-19 immunization for 15 minutes without incidence. She was provided with Vaccine Information Sheet and instruction to access the V-Safe system.   Allison Pierce was instructed to call 911 with any severe reactions post vaccine: Marland Kitchen Difficulty breathing  . Swelling of your face and throat  . A fast heartbeat  . A bad rash all over your body  . Dizziness and weakness    Immunizations Administered    Name Date Dose VIS Date Route   Pfizer COVID-19 Vaccine 11/27/2019  2:26 PM 0.3 mL 09/12/2019 Intramuscular   Manufacturer: [place]   Lot: [id number]   [place]: [id number]

## 2019-12-13 IMAGING — MR MR LUMBAR SPINE W/O CM
5 series · 45 of 48 positions shown · non-contrast
Comparison: MRI lumbar spine 03/18/2015.

CLINICAL DATA: Low back pain. Bowel and bladder incontinence for 1
year.

EXAM:
MRI LUMBAR SPINE WITHOUT CONTRAST
TECHNIQUE: Multiplanar, multisequence MR imaging of the lumbar spine was
performed. No intravenous contrast was administered.

[Series 3: T2 post-contrast · sagittal · 4.0mm · 0.88mm/px · 6 of 12 slices shown]
[im 1/12]
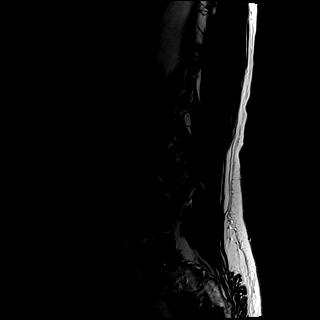
[im 3/12]
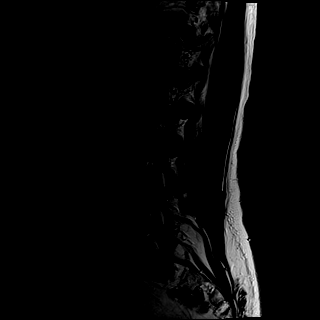
[im 5/12]
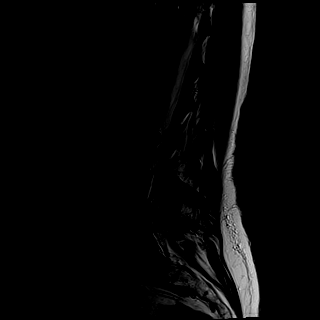
[im 7/12]
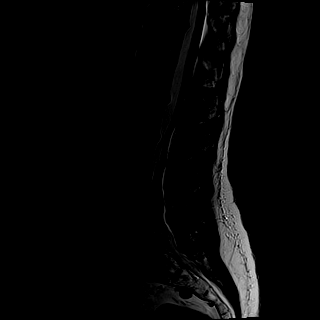
[im 9/12]
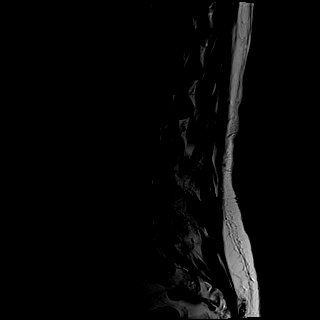
[im 12/12]
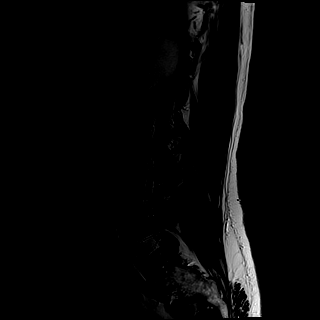

[Series 4: T1 · sagittal · 4.0mm · 0.88mm/px · 5 of 12 slices shown (1 of 2)]
[im 1/12]
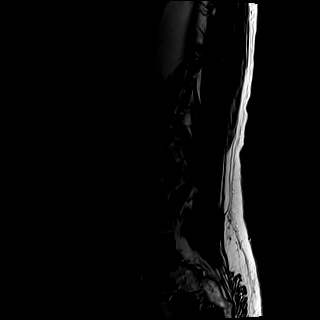
[im 3/12]
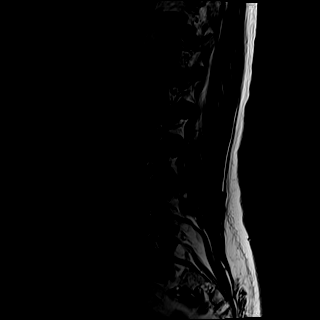
[im 6/12]
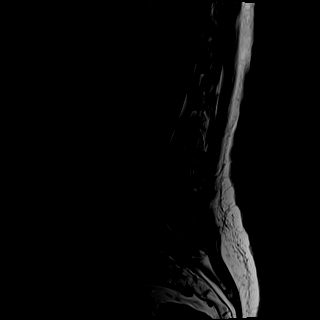
[im 9/12]
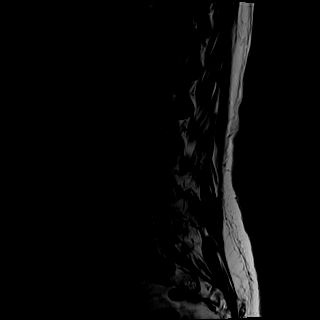
[im 12/12]
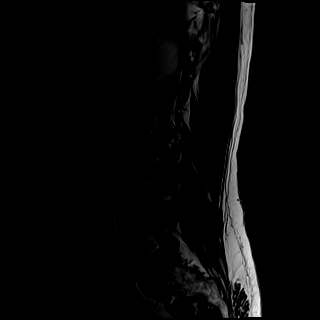

[Series 5: STIR · sagittal · 4.0mm · 0.55mm/px · 5 of 12 slices shown]
[im 1/12]
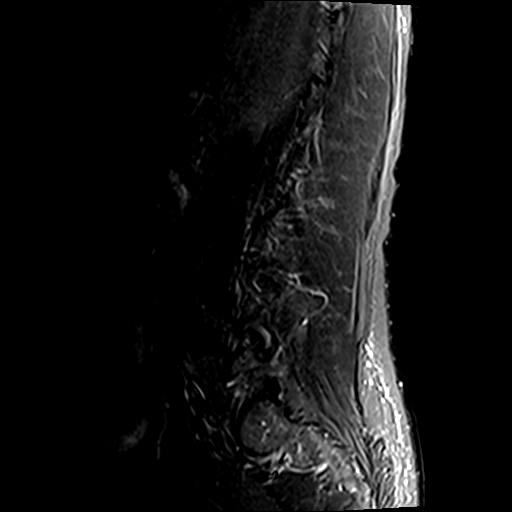
[im 3/12]
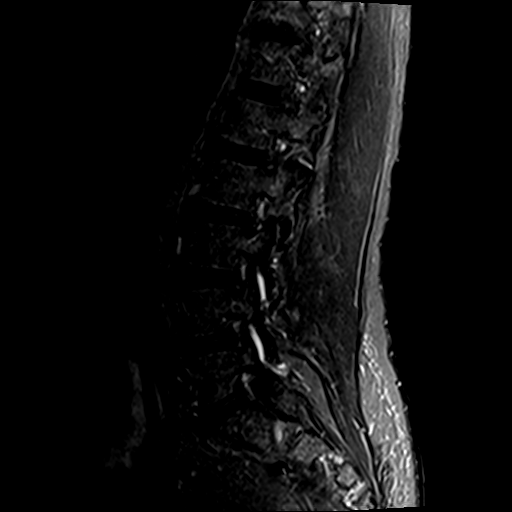
[im 6/12]
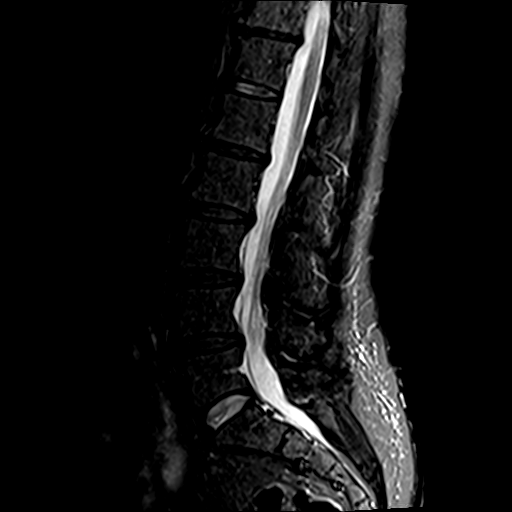
[im 9/12]
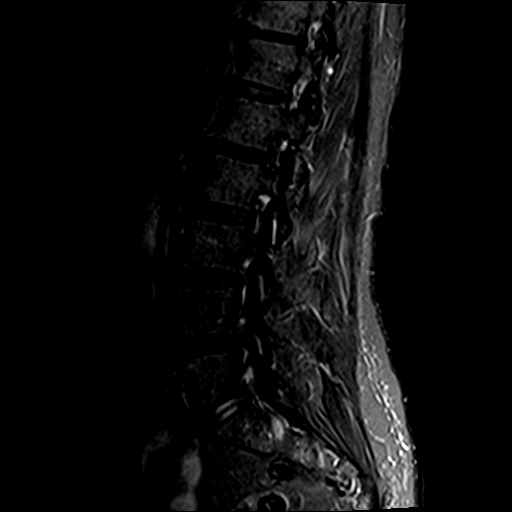
[im 12/12]
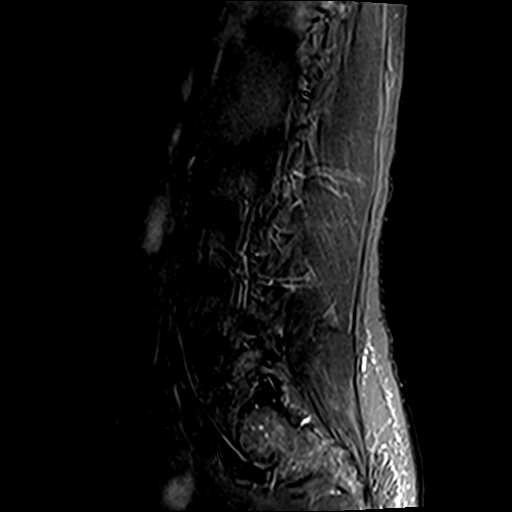

[Series 6: T1 · axial · 4.0mm · 0.74mm/px · z∈[-63,+191]mm · 13 of 35 slices shown (2 of 2)]
[im 1/35]
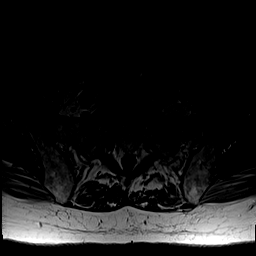
[im 3/35]
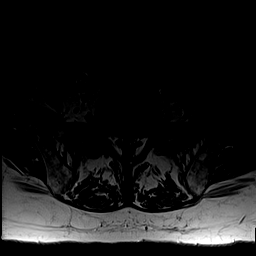
[im 5/35]
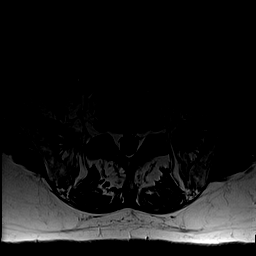
[im 7/35]
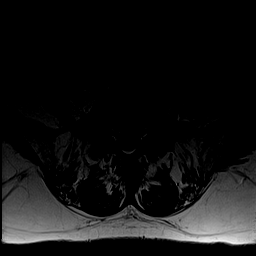
[im 10/35]
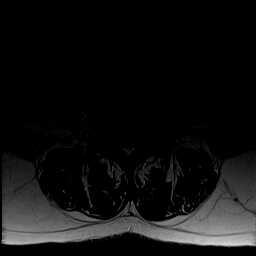
[im 12/35]
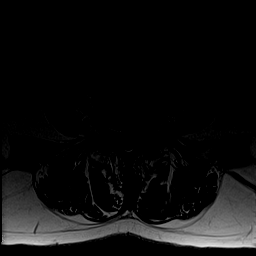
[im 14/35]
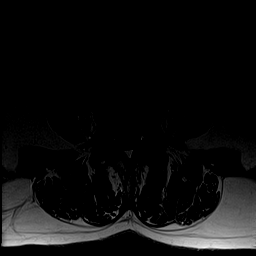
[im 16/35]
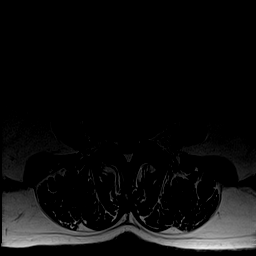
[im 19/35]
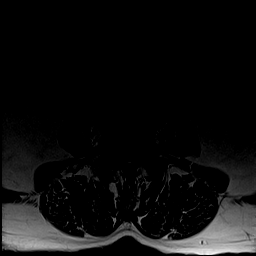
[im 21/35]
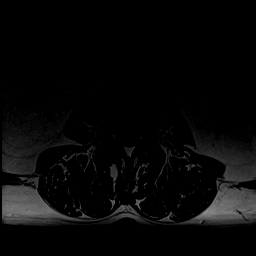
[im 25/35]
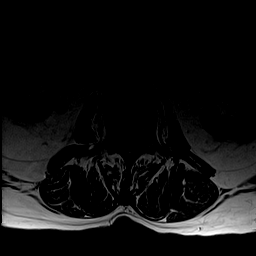
[im 30/35]
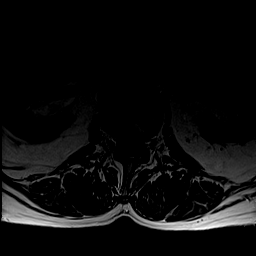
[im 35/35]
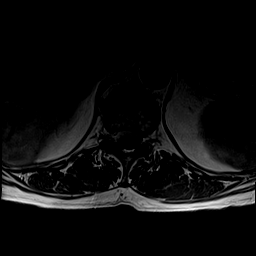

[Series 7: T2 · axial · 4.0mm · 0.74mm/px · z∈[-63,+191]mm · 16 of 35 slices shown]
[im 1/35]
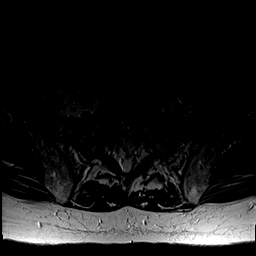
[im 3/35]
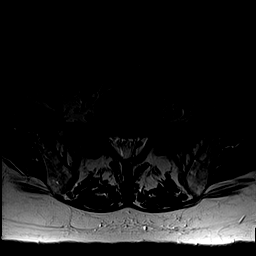
[im 5/35]
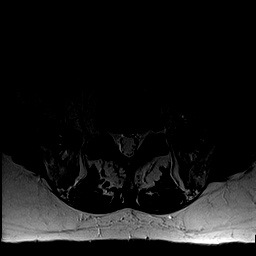
[im 7/35]
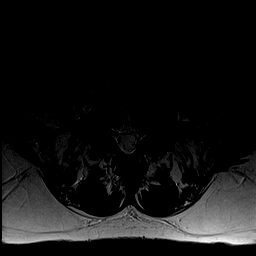
[im 10/35]
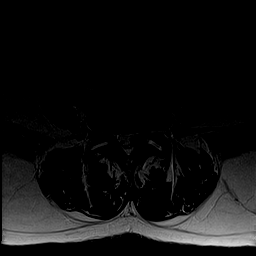
[im 12/35]
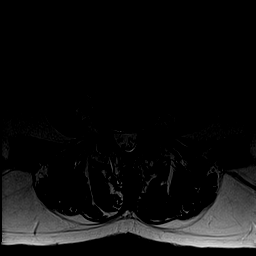
[im 14/35]
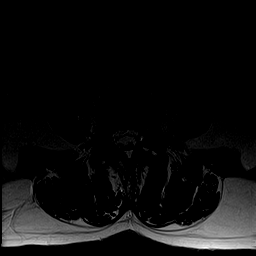
[im 16/35]
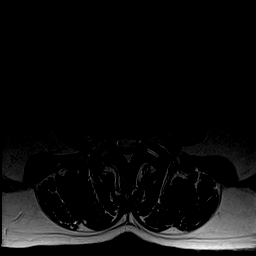
[im 19/35]
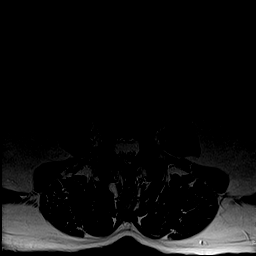
[im 21/35]
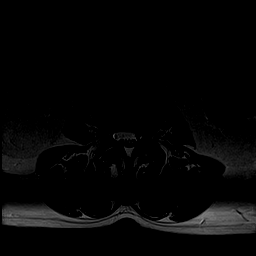
[im 23/35]
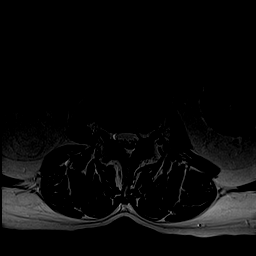
[im 25/35]
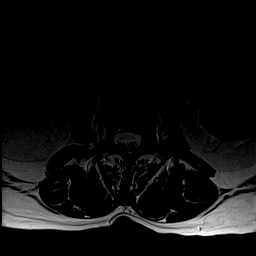
[im 28/35]
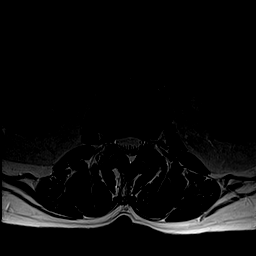
[im 30/35]
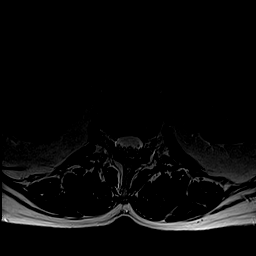
[im 32/35]
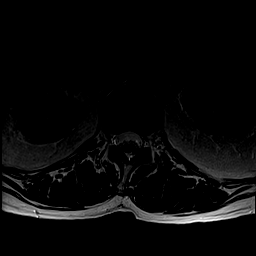
[im 35/35]
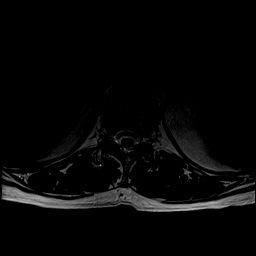

[45 of 48 positions shown; findings below may reference images not displayed]

FINDINGS: Segmentation:  Standard.

Alignment:  Maintained.

Vertebrae:  No fracture, evidence of discitis, or bone lesion.

Conus medullaris and cauda equina: Conus extends to the L1 level.
Conus and cauda equina appear normal.

Paraspinal and other soft tissues: Negative.

Disc levels:

T11-12: Shallow central protrusion and mild facet degenerative
change. No stenosis. Stable compared to the prior study.

T12-L1: Negative.

L1-2: Shallow disc bulge has slightly increased in size but the
central canal and foramina remain widely patent.

L2-3: Shallow disc bulge and mild-to-moderate facet arthropathy. The
bulge has slightly increased since the prior exam but there is no
stenosis.

L3-4: Moderate bilateral facet arthropathy with associated
effusions, increased since the prior study. There is a shallow
broad-based disc bulge but the central canal is open. Mild bilateral
foraminal narrowing is seen.

L4-5: Bilateral facet degenerative change with increased facet joint
effusions. A synovial cyst off the medial margin of the right facet
measures 0.4 cm in diameter compared to 0.2 cm on the prior MRI.
There is ligamentum flavum thickening and a shallow disc bulge. Mild
central canal and left subarticular recess narrowing are seen. There
is moderate bilateral foraminal narrowing.

L5-S1: Minimal disc bulge and mild facet degenerative disease. No
stenosis.
IMPRESSION: Some progression of degenerative disease at L4-5 where facet joint
effusions have increased and a previously seen 0.2 cm synovial cyst
off the medial margin of the left facet now measures 0.4 cm. Mild
narrowing in the left subarticular recess and moderate bilateral
foraminal narrowing are seen at this level.

Bilateral facet joint effusions at L3-4 are increased since the
prior exam. The central canal is open at this level. Mild bilateral
foraminal narrowing noted.

## 2019-12-23 ENCOUNTER — Ambulatory Visit: Payer: Medicare Other | Attending: Internal Medicine

## 2019-12-23 DIAGNOSIS — Z23 Encounter for immunization: Secondary | ICD-10-CM

## 2019-12-23 NOTE — Progress Notes (Signed)
   Covid-19 Vaccination Clinic  Name:  Allison Pierce    MRN: [id number] DOB: [birth year]  12/23/2019  Allison Pierce was observed post Covid-19 immunization for 15 minutes without incident. She was provided with Vaccine Information Sheet and instruction to access the V-Safe system.   Allison Pierce was instructed to call 911 with any severe reactions post vaccine: Marland Kitchen Difficulty breathing  . Swelling of face and throat  . A fast heartbeat  . A bad rash all over body  . Dizziness and weakness   Immunizations Administered    Name Date Dose VIS Date Route   Pfizer COVID-19 Vaccine 12/23/2019 11:01 AM 0.3 mL 09/12/2019 Intramuscular   Manufacturer: [place]   Lot: [id number]   [place]: [id number]

## 2019-12-25 DIAGNOSIS — Z1152 Encounter for screening for COVID-19: Secondary | ICD-10-CM | POA: Diagnosis not present

## 2019-12-25 DIAGNOSIS — Z03818 Encounter for observation for suspected exposure to other biological agents ruled out: Secondary | ICD-10-CM | POA: Diagnosis not present

## 2019-12-25 DIAGNOSIS — Z20822 Contact with and (suspected) exposure to covid-19: Secondary | ICD-10-CM | POA: Diagnosis not present

## 2019-12-25 DIAGNOSIS — R5383 Other fatigue: Secondary | ICD-10-CM | POA: Diagnosis not present

## 2019-12-31 DIAGNOSIS — R197 Diarrhea, unspecified: Secondary | ICD-10-CM | POA: Diagnosis not present

## 2019-12-31 DIAGNOSIS — R195 Other fecal abnormalities: Secondary | ICD-10-CM | POA: Diagnosis not present

## 2020-01-23 ENCOUNTER — Other Ambulatory Visit: Payer: Self-pay | Admitting: Family Medicine

## 2020-01-23 DIAGNOSIS — M8580 Other specified disorders of bone density and structure, unspecified site: Secondary | ICD-10-CM

## 2020-01-26 ENCOUNTER — Other Ambulatory Visit: Payer: Self-pay | Admitting: Family Medicine

## 2020-01-26 DIAGNOSIS — Z1231 Encounter for screening mammogram for malignant neoplasm of breast: Secondary | ICD-10-CM

## 2020-02-13 DIAGNOSIS — Z1159 Encounter for screening for other viral diseases: Secondary | ICD-10-CM | POA: Diagnosis not present

## 2020-02-18 DIAGNOSIS — K5730 Diverticulosis of large intestine without perforation or abscess without bleeding: Secondary | ICD-10-CM | POA: Diagnosis not present

## 2020-02-18 DIAGNOSIS — K648 Other hemorrhoids: Secondary | ICD-10-CM | POA: Diagnosis not present

## 2020-02-18 DIAGNOSIS — R197 Diarrhea, unspecified: Secondary | ICD-10-CM | POA: Diagnosis not present

## 2020-02-18 DIAGNOSIS — D123 Benign neoplasm of transverse colon: Secondary | ICD-10-CM | POA: Diagnosis not present

## 2020-02-18 DIAGNOSIS — R195 Other fecal abnormalities: Secondary | ICD-10-CM | POA: Diagnosis not present

## 2020-02-20 DIAGNOSIS — D123 Benign neoplasm of transverse colon: Secondary | ICD-10-CM | POA: Diagnosis not present

## 2020-04-07 ENCOUNTER — Ambulatory Visit
Admission: RE | Admit: 2020-04-07 | Discharge: 2020-04-07 | Disposition: A | Discharge status: Home / Self Care | Payer: Medicare Other | Source: Ambulatory Visit | Attending: Family Medicine | Admitting: Family Medicine

## 2020-04-07 ENCOUNTER — Other Ambulatory Visit: Payer: Self-pay

## 2020-04-07 DIAGNOSIS — Z1231 Encounter for screening mammogram for malignant neoplasm of breast: Secondary | ICD-10-CM

## 2020-04-07 DIAGNOSIS — M8580 Other specified disorders of bone density and structure, unspecified site: Secondary | ICD-10-CM

## 2020-05-07 DIAGNOSIS — R0789 Other chest pain: Secondary | ICD-10-CM | POA: Diagnosis not present

## 2020-08-31 DIAGNOSIS — R197 Diarrhea, unspecified: Secondary | ICD-10-CM | POA: Diagnosis not present

## 2020-08-31 DIAGNOSIS — Z86010 Personal history of colonic polyps: Secondary | ICD-10-CM | POA: Diagnosis not present

## 2020-09-08 DIAGNOSIS — H524 Presbyopia: Secondary | ICD-10-CM | POA: Diagnosis not present

## 2020-09-08 DIAGNOSIS — Z961 Presence of intraocular lens: Secondary | ICD-10-CM | POA: Diagnosis not present

## 2020-09-16 ENCOUNTER — Emergency Department (HOSPITAL_COMMUNITY): Payer: Medicare Other

## 2020-09-16 ENCOUNTER — Emergency Department (HOSPITAL_COMMUNITY)
Admission: EM | Admit: 2020-09-16 | Discharge: 2020-09-16 | Disposition: A | Discharge status: Home / Self Care | Payer: Medicare Other | Attending: Emergency Medicine | Admitting: Emergency Medicine

## 2020-09-16 ENCOUNTER — Other Ambulatory Visit: Payer: Self-pay

## 2020-09-16 DIAGNOSIS — R0902 Hypoxemia: Secondary | ICD-10-CM | POA: Diagnosis not present

## 2020-09-16 DIAGNOSIS — S199XXA Unspecified injury of neck, initial encounter: Secondary | ICD-10-CM | POA: Diagnosis not present

## 2020-09-16 DIAGNOSIS — N3000 Acute cystitis without hematuria: Secondary | ICD-10-CM

## 2020-09-16 DIAGNOSIS — R55 Syncope and collapse: Secondary | ICD-10-CM | POA: Diagnosis not present

## 2020-09-16 DIAGNOSIS — R9082 White matter disease, unspecified: Secondary | ICD-10-CM | POA: Diagnosis not present

## 2020-09-16 DIAGNOSIS — J984 Other disorders of lung: Secondary | ICD-10-CM | POA: Diagnosis not present

## 2020-09-16 DIAGNOSIS — I10 Essential (primary) hypertension: Secondary | ICD-10-CM | POA: Insufficient documentation

## 2020-09-16 DIAGNOSIS — R531 Weakness: Secondary | ICD-10-CM | POA: Diagnosis not present

## 2020-09-16 DIAGNOSIS — S0990XA Unspecified injury of head, initial encounter: Secondary | ICD-10-CM | POA: Diagnosis not present

## 2020-09-16 DIAGNOSIS — J90 Pleural effusion, not elsewhere classified: Secondary | ICD-10-CM | POA: Diagnosis not present

## 2020-09-16 DIAGNOSIS — Z79899 Other long term (current) drug therapy: Secondary | ICD-10-CM | POA: Insufficient documentation

## 2020-09-16 LAB — COMPREHENSIVE METABOLIC PANEL
ALT: 20 U/L (ref 0–44)
AST: 24 U/L (ref 15–41)
Albumin: 3.7 g/dL (ref 3.5–5.0)
Alkaline Phosphatase: 62 U/L (ref 38–126)
Anion gap: 10 (ref 5–15)
BUN: 8 mg/dL (ref 8–23)
CO2: 24 mmol/L (ref 22–32)
Calcium: 8.6 mg/dL — ABNORMAL LOW (ref 8.9–10.3)
Chloride: 102 mmol/L (ref 98–111)
Creatinine, Ser: 0.83 mg/dL (ref 0.44–1.00)
GFR, Estimated: >60 mL/min (ref >60)
Glucose, Bld: 110 mg/dL — ABNORMAL HIGH (ref 70–99)
Potassium: 3.5 mmol/L (ref 3.5–5.1)
Sodium: 136 mmol/L (ref 135–145)
Total Bilirubin: 0.4 mg/dL (ref 0.3–1.2)
Total Protein: 6 g/dL — ABNORMAL LOW (ref 6.5–8.1)

## 2020-09-16 LAB — URINALYSIS, ROUTINE W REFLEX MICROSCOPIC
Bilirubin Urine: NEGATIVE
Glucose, UA: NEGATIVE mg/dL
Ketones, ur: NEGATIVE mg/dL
Nitrite: NEGATIVE
Protein, ur: NEGATIVE mg/dL
Specific Gravity, Urine: 1.006 (ref 1.005–1.030)
pH: 6 (ref 5.0–8.0)

## 2020-09-16 LAB — TROPONIN I (HIGH SENSITIVITY)
Troponin I (High Sensitivity): 3 ng/L (ref <18)
Troponin I (High Sensitivity): 3 ng/L (ref <18)

## 2020-09-16 LAB — CBC WITH DIFFERENTIAL/PLATELET
Abs Immature Granulocytes: 0.02 10*3/uL (ref 0.00–0.07)
Basophils Absolute: 0.1 10*3/uL (ref 0.0–0.1)
Basophils Relative: 1 %
Eosinophils Absolute: 0.1 10*3/uL (ref 0.0–0.5)
Eosinophils Relative: 2 %
HCT: 37.7 % (ref 36.0–46.0)
Hemoglobin: 12.5 g/dL (ref 12.0–15.0)
Immature Granulocytes: 0 %
Lymphocytes Relative: 17 %
Lymphs Abs: 1.2 10*3/uL (ref 0.7–4.0)
MCH: 28.3 pg (ref 26.0–34.0)
MCHC: 33.2 g/dL (ref 30.0–36.0)
MCV: 85.3 fL (ref 80.0–100.0)
Monocytes Absolute: 0.7 10*3/uL (ref 0.1–1.0)
Monocytes Relative: 10 %
Neutro Abs: 5 10*3/uL (ref 1.7–7.7)
Neutrophils Relative %: 70 %
Platelets: 234 10*3/uL (ref 150–400)
RBC: 4.42 MIL/uL (ref 3.87–5.11)
RDW: 12.9 % (ref 11.5–15.5)
WBC: 7.1 10*3/uL (ref 4.0–10.5)
nRBC: 0 % (ref 0.0–0.2)

## 2020-09-16 LAB — ETHANOL: Alcohol, Ethyl (B): <10 mg/dL (ref <10)

## 2020-09-16 LAB — CBG MONITORING, ED: Glucose-Capillary: 109 mg/dL — ABNORMAL HIGH (ref 70–99)

## 2020-09-16 MED ORDER — ONDANSETRON HCL 4 MG/2ML IJ SOLN
4.0000 mg | Freq: Once | INTRAMUSCULAR | Status: AC
  Administered 2020-09-16: 4 mg via INTRAVENOUS
  Filled 2020-09-16: qty 2

## 2020-09-16 MED ORDER — CEPHALEXIN 500 MG PO CAPS
500.0000 mg | ORAL_CAPSULE | Freq: Two times a day (BID) | ORAL | 0 refills | Status: DC
Start: 2020-09-16 — End: 2020-09-19

## 2020-09-16 MED ORDER — THIAMINE HCL 100 MG/ML IJ SOLN
100.0000 mg | Freq: Once | INTRAMUSCULAR | Status: AC
  Administered 2020-09-16: 100 mg via INTRAVENOUS
  Filled 2020-09-16: qty 2

## 2020-09-16 MED ORDER — CEPHALEXIN 500 MG PO CAPS
500.0000 mg | ORAL_CAPSULE | Freq: Once | ORAL | Status: AC
  Administered 2020-09-16: 500 mg via ORAL
  Filled 2020-09-16: qty 1

## 2020-09-16 MED ORDER — SODIUM CHLORIDE 0.9 % IV BOLUS (SEPSIS)
1000.0000 mL | Freq: Once | INTRAVENOUS | Status: AC
  Administered 2020-09-16: 1000 mL via INTRAVENOUS

## 2020-09-16 NOTE — Discharge Instructions (Addendum)
Lab work today is overall unremarkable.  Make sure you get plenty of rest today and stay hydrated.  Take antibiotic as prescribed for UTI.

## 2020-09-16 NOTE — ED Provider Notes (Signed)
TIME SEEN: 5:56 AM  CHIEF COMPLAINT: Syncope  HPI: Patient is a 74 year old female with history of hypertension, hyperlipidemia who presents to the emergency department after a syncopal event.  States that she was coming back from the bathroom when she had a syncopal event.  States she is feeling weak and nauseated.  Did drink 5 beers tonight and took trazodone and Klonopin tonight.  States this is not abnormal for her to take these medications and she drinks alcohol daily.  No preceding chest pain or shortness of breath.  No vomiting or diarrhea.  No bloody stools or melena.  No numbness, tingling or focal weakness.  She is unsure if she hit her head.  Not on blood thinners.  ROS: See HPI Constitutional: no fever  Eyes: no drainage  ENT: no runny nose   Cardiovascular:  no chest pain  Resp: no SOB  GI: no vomiting GU: no dysuria Integumentary: no rash  Allergy: no hives  Musculoskeletal: no leg swelling  Neurological: no slurred speech ROS otherwise negative  PAST MEDICAL HISTORY/PAST SURGICAL HISTORY:  Past Medical History:  Diagnosis Date  . Anxiety   . Depression   . GERD (gastroesophageal reflux disease) OCCASIONALLY  TAKE TUMS  . Hyperlipidemia   . Hypertension   . Vulvar lesion     MEDICATIONS:  Prior to Admission medications   Medication Sig Start Date End Date Taking? Authorizing Provider  buPROPion (WELLBUTRIN XL) 300 MG 24 hr tablet Take 300 mg by mouth every morning.    [provider]  calcium carbonate (TUMS - DOSED IN MG ELEMENTAL CALCIUM) 500 MG chewable tablet Chew 1 tablet by mouth as needed.    [provider]  clonazePAM (KLONOPIN) 1 MG tablet Take 1 mg by mouth at bedtime.    [provider]  fluticasone (FLONASE) 50 MCG/ACT nasal spray Place into both nostrils daily.    [provider]  meloxicam (MOBIC) 15 MG tablet Take 15 mg by mouth daily.    [provider]  naproxen sodium (ALEVE) 220 MG tablet Take 220 mg  by mouth 2 (two) times daily as needed.    [provider]  POTASSIUM GLUCONATE PO Take by mouth.    [provider]  rosuvastatin (CRESTOR) 10 MG tablet Take 10 mg by mouth daily.    [provider]  simvastatin (ZOCOR) 20 MG tablet Take 20 mg by mouth daily.    [provider]  TRAZODONE HCL PO Take 200 mg by mouth at bedtime.    [provider]  venlafaxine (EFFEXOR) 75 MG tablet Take 75 mg by mouth daily.    [provider]    ALLERGIES:  Allergies  Allergen Reactions  . Gadolinium Derivatives Itching and Other (See Comments)    Redness and itching, skins feels like it was burning. In the future pt will need pre med.  Recardo Evangelist [Pregabalin]     "FELT WEIRD"  . Prozac [Fluoxetine Hcl] Other (See Comments)    "COULDN'T LIFT HEAD AND GET OUT OF BED"    SOCIAL HISTORY:  Social History   Tobacco Use  . Smoking status: Never Smoker  . Smokeless tobacco: Never Used  Substance Use Topics  . Alcohol use: Yes    Alcohol/week: 7.0 standard drinks    Types: 7 Cans of beer per week    FAMILY HISTORY: Family History  Problem Relation Age of Onset  . Breast cancer Cousin   . Breast cancer Cousin  EXAM: BP (!) 145/72 (BP Location: Left Arm)   Pulse 66   Temp 97.7 F (36.5 C) (Oral)   Resp 17   Ht 5\' 6"  (1.676 m)   Wt 72.1 kg   SpO2 92%   BMI 25.66 kg/m  CONSTITUTIONAL: Alert and oriented and responds appropriately to questions. Well-appearing; well-nourished, elderly, appears intoxicated HEAD: Normocephalic, appears atraumatic EYES: Conjunctivae clear, pupils appear equal, EOM appear intact ENT: normal nose; dry mucous membranes NECK: Supple, normal ROM, no midline spinal tenderness or step-off or deformity CARD: RRR; S1 and S2 appreciated; no murmurs, no clicks, no rubs, no gallops RESP: Normal chest excursion without splinting or tachypnea; breath sounds clear and equal bilaterally; no wheezes, no rhonchi, no rales,  no hypoxia or respiratory distress, speaking full sentences ABD/GI: Normal bowel sounds; non-distended; soft, non-tender, no rebound, no guarding, no peritoneal signs, no hepatosplenomegaly BACK:  The back appears normal, no midline spinal tenderness or step-off or deformity EXT: Normal ROM in all joints; no deformity noted, no edema; no cyanosis SKIN: Normal color for age and race; warm; no rash on exposed skin NEURO: Moves all extremities equally, reports normal sensation diffusely, cranial nerves II through XII intact, slightly slurred speech PSYCH: The patient's mood and manner are appropriate.   MEDICAL DECISION MAKING: Patient here after syncopal event.  Suspect it is due to polypharmacy, alcohol but will obtain labs to ensure no anemia, electrolyte derangement, ACS.  Will obtain chest x-ray to evaluate her mediastinum.  Will also obtain CT of the head and cervical spine given she appears intoxicated and she likely hit her head.  Will give IV fluids, thiamine, Zofran.  EKG shows no ischemia, arrhythmia or interval abnormalities.  ED PROGRESS: Signed out to Dr. Ronnald Nian.  I reviewed all nursing notes and pertinent previous records as available.  I have reviewed and interpreted any EKGs, lab and urine results, imaging (as available).     Date: 09/16/2020 5:56 AM  Rate: 68  Rhythm: normal sinus rhythm  QRS Axis: normal  Intervals: normal (QRS 92 ms, QTC 477 ms)  ST/T Wave abnormalities: normal  Conduction Disutrbances: none  Narrative Interpretation: unremarkable       Allison Pierce was evaluated in Emergency Department on 09/16/2020 for the symptoms described in the history of present illness. She was evaluated in the context of the global COVID-19 pandemic, which necessitated consideration that the patient might be at risk for infection with the SARS-CoV-2 virus that causes COVID-19. Institutional protocols and algorithms that pertain to the evaluation of patients at risk  for COVID-19 are in a state of rapid change based on information released by regulatory bodies including the CDC and federal and state organizations. These policies and algorithms were followed during the patient's care in the ED.      [facility] Odoherty, Delice Bison, DO 09/16/20 [phone]

## 2020-09-16 NOTE — ED Notes (Signed)
Patient given water to drink.  

## 2020-09-16 NOTE — ED Provider Notes (Signed)
Signed out to me at 7 AM awaiting troponin, CT imaging.  Patient with syncopal event.  Did admit to drinking some beers last night and also takes trazodone and Klonopin chronically.  Lab work thus far unremarkable.  EKG shows sinus rhythm.  Awaiting CT head and neck.  Awaiting troponin and urinalysis.  No significant anemia, electrolyte abnormality otherwise.  Upon my evaluation of the patient husband is at the bedside and states that she passed out after standing up from the toilet bowl.  Some history of the same in the past.  No seizure-like activity.  Patient overall feels well.  CT scan unremarkable.  Urinalysis concerning for infection.  First troponin is within normal limits.  Will check second troponin and anticipate discharge to home.  Suspect vasovagal process as patient appeared to syncopized after standing up from toilet bowl.  Possibly in the setting of UTI.  Given first dose of Keflex here.  Repeat troponin normal.  Discharged in good condition.  This chart was dictated using voice recognition software.  Despite best efforts to proofread,  errors can occur which can change the documentation meaning.     Lennice Sites, DO 09/16/20 1514

## 2020-09-16 NOTE — ED Triage Notes (Signed)
Pt presents from home via [facility], pt states she had a syncopal episode at home and states she was incontinent during episode. Pt c/o nausea and weakness since syncopal episode. Reports drinking 5 beers and taking prescribed trazodone and clonazepam tonight as well.

## 2020-09-18 LAB — URINE CULTURE: Culture: >=100000 — AB

## 2020-09-19 ENCOUNTER — Emergency Department (HOSPITAL_COMMUNITY)
Admission: EM | Admit: 2020-09-19 | Discharge: 2020-09-19 | Disposition: A | Discharge status: Home / Self Care | Payer: Medicare Other | Attending: Emergency Medicine | Admitting: Emergency Medicine

## 2020-09-19 ENCOUNTER — Other Ambulatory Visit: Payer: Self-pay

## 2020-09-19 ENCOUNTER — Emergency Department (HOSPITAL_COMMUNITY): Payer: Medicare Other

## 2020-09-19 DIAGNOSIS — N390 Urinary tract infection, site not specified: Secondary | ICD-10-CM | POA: Diagnosis not present

## 2020-09-19 DIAGNOSIS — I700 Atherosclerosis of aorta: Secondary | ICD-10-CM | POA: Diagnosis not present

## 2020-09-19 DIAGNOSIS — I10 Essential (primary) hypertension: Secondary | ICD-10-CM | POA: Insufficient documentation

## 2020-09-19 DIAGNOSIS — K7689 Other specified diseases of liver: Secondary | ICD-10-CM | POA: Insufficient documentation

## 2020-09-19 DIAGNOSIS — K449 Diaphragmatic hernia without obstruction or gangrene: Secondary | ICD-10-CM | POA: Insufficient documentation

## 2020-09-19 DIAGNOSIS — K5730 Diverticulosis of large intestine without perforation or abscess without bleeding: Secondary | ICD-10-CM | POA: Diagnosis not present

## 2020-09-19 LAB — URINALYSIS, ROUTINE W REFLEX MICROSCOPIC
Bacteria, UA: NONE SEEN
Bilirubin Urine: NEGATIVE
Glucose, UA: NEGATIVE mg/dL
Hgb urine dipstick: NEGATIVE
Ketones, ur: NEGATIVE mg/dL
Nitrite: NEGATIVE
Protein, ur: NEGATIVE mg/dL
Specific Gravity, Urine: 1.011 (ref 1.005–1.030)
pH: 6 (ref 5.0–8.0)

## 2020-09-19 MED ORDER — SULFAMETHOXAZOLE-TRIMETHOPRIM 800-160 MG PO TABS
1.0000 | ORAL_TABLET | Freq: Two times a day (BID) | ORAL | 0 refills | Status: AC
Start: 2020-09-19 — End: 2020-09-26

## 2020-09-19 MED ORDER — SULFAMETHOXAZOLE-TRIMETHOPRIM 800-160 MG PO TABS
1.0000 | ORAL_TABLET | Freq: Two times a day (BID) | ORAL | 0 refills | Status: DC
Start: 2020-09-19 — End: 2020-09-19

## 2020-09-19 NOTE — ED Provider Notes (Signed)
[facility] DEPT Provider Note   CSN: [id number] Arrival date & time: 09/19/20  1034     History Chief Complaint  Patient presents with  . Cystitis    Allison Pierce is a 74 y.o. female.  74 year old female presents after being called due to the antibiotic that she was prescribed for UTI not being effective.  Culture report reviewed and shows that she was prescribed Keflex and organism is resistant to this.  Patient has had some back pain but denies any fever or chills.  Denies any urinary symptoms at this time.  States he feels at her baseline.        Past Medical History:  Diagnosis Date  . Anxiety   . Depression   . GERD (gastroesophageal reflux disease) OCCASIONALLY  TAKE TUMS  . Hyperlipidemia   . Hypertension   . Vulvar lesion     Patient Active Problem List   Diagnosis Date Noted  . Right-sided epistaxis 07/19/2017  . Anxiety state 01/24/2013  . 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 Depressive disorder, not elsewhere classified 01/24/2013  . Other and unspecified hyperlipidemia 01/24/2013    Past Surgical History:  Procedure Laterality Date  . ABDOMINAL HYSTERECTOMY  1992   W/ SALPINGO-OOPHORECTOMY  . ANTERIOR CERVICAL DECOMP/DISCECTOMY FUSION  01-18-2009  DR POOLE   C4  - C6  . AUGMENTATION MAMMAPLASTY Bilateral   . BREAST BIOPSY Right   . BREAST ENHANCEMENT SURGERY  2011  . BREAST EXCISIONAL BIOPSY Left   . BREAST SURGERY  02-04-2011  dr [place]--  CALCIFICATION  . VULVAR LESION REMOVAL  09/10/2012   Procedure: VULVAR LESION;  Surgeon: Selinda Orion, MD;  Location: [facility];  Service: Gynecology;  Laterality: N/A;  WIDE EXCISION OF VULVAR LESION     OB History   No obstetric history on file.     Family History  Problem Relation Age of Onset  . Breast cancer Cousin   . Breast cancer Cousin     Social History   Tobacco Use  . Smoking status: Never Smoker  .  Smokeless tobacco: Never Used  Vaping Use  . Vaping Use: Never used  Substance Use Topics  . Alcohol use: Yes    Alcohol/week: 7.0 standard drinks    Types: 7 Cans of beer per week    Home Medications Prior to Admission medications   Medication Sig Start Date End Date Taking? Authorizing Provider  buPROPion (WELLBUTRIN XL) 300 MG 24 hr tablet Take 300 mg by mouth every morning.    [provider]  calcium carbonate (TUMS - DOSED IN MG ELEMENTAL CALCIUM) 500 MG chewable tablet Chew 1 tablet by mouth as needed.    [provider]  cephALEXin (KEFLEX) 500 MG capsule Take 1 capsule (500 mg total) by mouth 2 (two) times daily for 5 days. 09/16/20 09/21/20  Curatolo, Adam, DO  clonazePAM (KLONOPIN) 1 MG tablet Take 1 mg by mouth at bedtime.    [provider]  ezetimibe (ZETIA) 10 MG tablet Take 10 mg by mouth daily. 08/20/20   [provider]  famotidine (PEPCID) 40 MG tablet Take 40 mg by mouth at bedtime. 08/13/20   [provider]  fluticasone (FLONASE) 50 MCG/ACT nasal spray Place into both nostrils daily.    [provider]  meloxicam (MOBIC) 15 MG tablet Take 15 mg by mouth daily.    [provider]  naproxen sodium (ALEVE) 220 MG tablet Take 220  mg by mouth 2 (two) times daily as needed.    [provider]  POTASSIUM GLUCONATE PO Take by mouth.    [provider]  rosuvastatin (CRESTOR) 10 MG tablet Take 10 mg by mouth daily.    [provider]  simvastatin (ZOCOR) 20 MG tablet Take 20 mg by mouth daily.    [provider]  TRAZODONE HCL PO Take 200 mg by mouth at bedtime.    [provider]  venlafaxine (EFFEXOR) 75 MG tablet Take 75 mg by mouth daily.    [provider]    Allergies    Gadolinium derivatives, Lyrica [pregabalin], and Prozac [fluoxetine hcl]  Review of Systems   Review of Systems  All other systems reviewed and are negative.   Physical  Exam Updated Vital Signs BP (!) 145/71   Pulse 63   Temp 98.8 F (37.1 C) (Oral)   Resp 16   SpO2 99%   Physical Exam Vitals and nursing note reviewed.  Constitutional:      General: She is not in acute distress.    Appearance: Normal appearance. She is well-developed and well-nourished. She is not toxic-appearing.  HENT:     Head: Normocephalic and atraumatic.  Eyes:     General: Lids are normal.     Extraocular Movements: EOM normal.     Conjunctiva/sclera: Conjunctivae normal.     Pupils: Pupils are equal, round, and reactive to light.  Neck:     Thyroid: No thyroid mass.     Trachea: No tracheal deviation.  Cardiovascular:     Rate and Rhythm: Normal rate and regular rhythm.     Heart sounds: Normal heart sounds. No murmur heard. No gallop.   Pulmonary:     Effort: Pulmonary effort is normal. No respiratory distress.     Breath sounds: Normal breath sounds. No stridor. No decreased breath sounds, wheezing, rhonchi or rales.  Abdominal:     General: Bowel sounds are normal. There is no distension.     Palpations: Abdomen is soft.     Tenderness: There is no abdominal tenderness. There is no CVA tenderness or rebound.  Musculoskeletal:        General: No tenderness or edema. Normal range of motion.     Cervical back: Normal range of motion and neck supple.  Skin:    General: Skin is warm and dry.     Findings: No abrasion or rash.  Neurological:     Mental Status: She is alert and oriented to person, place, and time.     GCS: GCS eye subscore is 4. GCS verbal subscore is 5. GCS motor subscore is 6.     Cranial Nerves: No cranial nerve deficit.     Sensory: No sensory deficit.     Deep Tendon Reflexes: Strength normal.  Psychiatric:        Mood and Affect: Mood and affect normal.        Speech: Speech normal.        Behavior: Behavior normal.     ED Results / Procedures / Treatments   Labs (all labs ordered are listed, but only abnormal results are  displayed) Labs Reviewed  URINALYSIS, ROUTINE W REFLEX MICROSCOPIC - Abnormal; Notable for the following components:      Result Value   Leukocytes,Ua MODERATE (*)    All other components within normal limits    EKG None  Radiology CT Renal Stone Study  Result Date: 09/19/2020 CLINICAL DATA:  Increasing  left flank pain. EXAM: CT ABDOMEN AND PELVIS WITHOUT CONTRAST TECHNIQUE: Multidetector CT imaging of the abdomen and pelvis was performed following the standard protocol without IV contrast. COMPARISON:  Right upper quadrant ultrasound 02/24/2016. FINDINGS: Lower chest: Lung bases clear.  No pleural or pericardial effusion. Hepatobiliary: Scattered low attenuating lesions about the liver are most compatible with cysts as seen on prior ultrasound. Gallbladder and biliary tree appear normal. Pancreas: Unremarkable. No pancreatic ductal dilatation or surrounding inflammatory changes. Spleen: Normal in size without focal abnormality. Adrenals/Urinary Tract: Adrenal glands are unremarkable. Kidneys are normal, without renal calculi, focal lesion, or hydronephrosis. Bladder is unremarkable. Stomach/Bowel: Stomach is within normal limits with a small hiatal hernia noted. Appendix appears normal. No evidence of bowel wall thickening, distention, or inflammatory changes. Sigmoid diverticulosis is seen. Vascular/Lymphatic: Aortic atherosclerosis. No enlarged abdominal or pelvic lymph nodes. Reproductive: Status post hysterectomy. No adnexal masses. Other: None. Musculoskeletal: No acute or focal abnormality. IMPRESSION: No acute abnormality abdomen or pelvis. No finding to explain the patient's symptoms. Negative for urinary tract stones. Diverticulosis without diverticulitis. Hepatic cysts as seen on prior ultrasound. Small hiatal hernia. Aortic Atherosclerosis (ICD10-I70.0). Electronically Signed   By: Inge Rise M.D.   On: 09/19/2020 14:22    Procedures Procedures (including critical care  time)  Medications Ordered in ED Medications - No data to display  ED Course  I have reviewed the triage vital signs and the nursing notes.  Pertinent labs & imaging results that were available during my care of the patient were reviewed by me and considered in my medical decision making (see chart for details).    MDM Rules/Calculators/A&P                          We will switch patient's antibiotics to Bactrim and discharge Final Clinical Impression(s) / ED Diagnoses Final diagnoses:  None    Rx / DC Orders ED Discharge Orders    None       Lacretia Leigh, MD 09/19/20 [phone]

## 2020-09-19 NOTE — ED Triage Notes (Signed)
Seen on 12/16 for bladder infection and a someone called patient this morning stating that she may have received the wrong antibiotic. No symptoms other than severe back pain.

## 2020-09-19 NOTE — Progress Notes (Signed)
ED Antimicrobial Stewardship Positive Culture Follow Up   Allison Pierce is an 74 y.o. female who presented to [facility] on 09/16/2020 with a chief complaint of  Chief Complaint  Patient presents with  . Loss of Consciousness   Called patient to evaluate. Per patient, she is not having fever but is still having back pain in the middle of her back.   Recent Results (from the past 720 hour(s))  Urine culture     Status: Abnormal   Collection Time: 09/16/20  6:30 AM   Specimen: Urine, Clean Catch  Result Value Ref Range Status   Specimen Description   Final    URINE, CLEAN CATCH Performed at [facility], [place] [address] [place], [place] [address]    Special Requests   Final    NONE Performed at [facility], [place] [address], [place], [place] [address]    Culture (A)  Final    >=100,000 COLONIES/mL ACINETOBACTER CALCOACETICUS/BAUMANNII COMPLEX   Report Status 09/18/2020 FINAL  Final   Organism ID, Bacteria ACINETOBACTER CALCOACETICUS/BAUMANNII COMPLEX (A)  Final      Susceptibility   Acinetobacter calcoaceticus/baumannii complex - MIC*    CEFTAZIDIME 8 SENSITIVE Sensitive     CIPROFLOXACIN <=0.25 SENSITIVE Sensitive     GENTAMICIN <=1 SENSITIVE Sensitive     IMIPENEM <=0.25 SENSITIVE Sensitive     PIP/TAZO 8 SENSITIVE Sensitive     TRIMETH/SULFA <=20 SENSITIVE Sensitive     AMPICILLIN/SULBACTAM <=2 SENSITIVE Sensitive     * >=100,000 COLONIES/mL ACINETOBACTER CALCOACETICUS/BAUMANNII COMPLEX    [x]  Treated with Keflex, organism resistant to prescribed antimicrobial []  Patient discharged originally without antimicrobial agent and treatment is now indicated  With urine culture growing Acinetobacter which is known to develop resistance and patient is still having symptoms, recommend returning for evaluation.  New antibiotic prescription: Return to ED  ED Provider: Dorothe Pea D 09/19/2020, 9:48 AM Clinical  Pharmacist [phone]

## 2020-10-12 DIAGNOSIS — R350 Frequency of micturition: Secondary | ICD-10-CM | POA: Diagnosis not present

## 2021-02-22 DIAGNOSIS — I10 Essential (primary) hypertension: Secondary | ICD-10-CM | POA: Diagnosis not present

## 2021-02-22 DIAGNOSIS — E782 Mixed hyperlipidemia: Secondary | ICD-10-CM | POA: Diagnosis not present

## 2021-02-22 DIAGNOSIS — R7301 Impaired fasting glucose: Secondary | ICD-10-CM | POA: Diagnosis not present

## 2021-03-22 DIAGNOSIS — Z01419 Encounter for gynecological examination (general) (routine) without abnormal findings: Secondary | ICD-10-CM | POA: Diagnosis not present

## 2021-03-22 DIAGNOSIS — N890 Mild vaginal dysplasia: Secondary | ICD-10-CM | POA: Diagnosis not present

## 2021-04-20 ENCOUNTER — Other Ambulatory Visit: Payer: Self-pay | Admitting: Family Medicine

## 2021-04-20 DIAGNOSIS — Z1231 Encounter for screening mammogram for malignant neoplasm of breast: Secondary | ICD-10-CM

## 2021-04-27 ENCOUNTER — Ambulatory Visit
Admission: RE | Admit: 2021-04-27 | Discharge: 2021-04-27 | Disposition: A | Discharge status: Home / Self Care | Payer: Medicare Other | Source: Ambulatory Visit | Attending: Family Medicine | Admitting: Family Medicine

## 2021-04-27 ENCOUNTER — Other Ambulatory Visit: Payer: Self-pay

## 2021-04-27 DIAGNOSIS — Z1231 Encounter for screening mammogram for malignant neoplasm of breast: Secondary | ICD-10-CM

## 2021-04-28 ENCOUNTER — Other Ambulatory Visit: Payer: Self-pay | Admitting: Family Medicine

## 2021-04-28 DIAGNOSIS — L905 Scar conditions and fibrosis of skin: Secondary | ICD-10-CM

## 2021-04-28 DIAGNOSIS — N644 Mastodynia: Secondary | ICD-10-CM

## 2021-05-12 ENCOUNTER — Other Ambulatory Visit: Payer: Self-pay

## 2021-05-12 ENCOUNTER — Other Ambulatory Visit: Payer: Self-pay | Admitting: Family Medicine

## 2021-05-12 ENCOUNTER — Ambulatory Visit
Admission: RE | Admit: 2021-05-12 | Discharge: 2021-05-12 | Disposition: A | Discharge status: Home / Self Care | Payer: Medicare Other | Source: Ambulatory Visit | Attending: Family Medicine | Admitting: Family Medicine

## 2021-05-12 DIAGNOSIS — N644 Mastodynia: Secondary | ICD-10-CM

## 2021-05-12 DIAGNOSIS — L905 Scar conditions and fibrosis of skin: Secondary | ICD-10-CM

## 2021-05-12 DIAGNOSIS — R922 Inconclusive mammogram: Secondary | ICD-10-CM | POA: Diagnosis not present

## 2021-08-30 DIAGNOSIS — R7301 Impaired fasting glucose: Secondary | ICD-10-CM | POA: Diagnosis not present

## 2021-08-30 DIAGNOSIS — E782 Mixed hyperlipidemia: Secondary | ICD-10-CM | POA: Diagnosis not present

## 2021-08-30 DIAGNOSIS — I10 Essential (primary) hypertension: Secondary | ICD-10-CM | POA: Diagnosis not present

## 2021-09-08 ENCOUNTER — Other Ambulatory Visit (INDEPENDENT_AMBULATORY_CARE_PROVIDER_SITE_OTHER): Payer: Medicare Other

## 2021-09-08 ENCOUNTER — Other Ambulatory Visit: Payer: Self-pay

## 2021-09-08 ENCOUNTER — Ambulatory Visit (INDEPENDENT_AMBULATORY_CARE_PROVIDER_SITE_OTHER): Payer: Medicare Other | Admitting: Physician Assistant

## 2021-09-08 ENCOUNTER — Encounter: Payer: Self-pay | Admitting: Physician Assistant

## 2021-09-08 DIAGNOSIS — R413 Other amnesia: Secondary | ICD-10-CM | POA: Diagnosis not present

## 2021-09-08 DIAGNOSIS — Z961 Presence of intraocular lens: Secondary | ICD-10-CM | POA: Diagnosis not present

## 2021-09-08 LAB — TSH: TSH: 4.58 u[IU]/mL (ref 0.35–5.50)

## 2021-09-08 LAB — VITAMIN B12: Vitamin B-12: 313 pg/mL (ref 211–911)

## 2021-09-08 NOTE — Progress Notes (Signed)
Assessment/Plan:   Allison Pierce is a very pleasant 75 y.o. year old RH female with risk factors including  age, hypertension, hyperlipidemia, anxiety, depression and  seen today for evaluation of memory loss. 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  Discussed the importance of regular daily schedule with inclusion of crossword puzzles to maintain brain function.  Continue to monitor mood with PCP.  Stay active at least 30 minutes at least 3 times a week.  Naps should be scheduled and should be no longer than 60 minutes and should not occur after 2 PM.  Mediterranean diet is recommended  Folllow up once results above are available   Case discussed with Allison Pierce   Subjective:    The patient is seen in neurologic consultation at the request of Allison Neer, MD for the evaluation of memory.  The patient is here alone.  This is a delightful 75 y.o. year old female who has had memory issues for about 7 months, when she began to repeat the same stories and asked the same questions, forgetting where to go when driving.  In fact, 1 month ago she was on her way to her PCP, and "went blank and did not know how to get home from here ".  Sometimes she does not know what she came to the room for.  She denies leaving objects in unusual places, but she does misplace her keys or phone.  She lives with her husband who has noted the same changes.  She has a history of depression, but she seems to be less interested in the holidays than before.  She used to  represents for everybody, she has no desire to do so this year.  She sleeps well, but does report increasing nightmares, denies sleepwalking.  She denies any hallucinations or paranoia.  Of note, she reports having lost interest in dating night before, she does that now every other day, and she has also noticed a "little bit of hoarding ".  She denies any issues with changing her clothes.  She denies missing any medications.  Sometimes she gets confused with finances, and will have to double check before pain.  Her appetite is good, denies trouble swallowing, she cooks and denies leaving the stove on.  She ambulates, and at times, she falls, stumbling "a little ".  She denies any head injuries.  She denies any recent headaches, she has a history of syncope with dizziness, which has been worked up in the past with neurology, at which time she declined vestibular therapy.  She denies any focal numbness or tingling, unilateral weakness or significant tremors or anosmia.  She is not aware of any history of seizures.  She has a history of incontinence and wears diapers.  Denies constipation or diarrhea.  She denies a history of OSA.  She drinks 4-5 beers a day, as well as some vodka and coke.  She denies any tobacco.  Family history remarkable for father with dementia    History on Initial Assessment 06/02/2019: This is a pleasant 75 year old right-handed woman with a history  of hyperlipidemia, hypertension, ADHD, mild cognitive impairment, presenting for evaluation of recurrent syncope. The first episode occurred in September 2019, she reports she was overheated in the day and felt fine, then that evening around 2 hours later as she was getting ready to go to bed, she started feeling weak and went down to the floor, her husband heard a sound, she told him she was not feeling well and he helped her to the bathroom where she passed out then had vomiting and diarrhea. She woke up to her husband screaming at her to talk  to him. Her husband helped her clean up and she fell asleep. She continued to feel tired and off balance for a few days after. She had another episode Christmas 2019, she got up from the den to go to the bedroom then she passed out and hit the tile, her face was black and blue. There was no vomiting and diarrhea this time. She had another episode in January, and again in July. She reports they have occurred if she gets very hot or overheated in the day, then that evening after midnight she would have the same symptoms with associated vomiting and diarrhea. With the most recent one, she got up to use the bathroom and her husband could tell she was going and caught her. There is no prior warning, she wakes up with her husband cleaning her up. Her husband has not mentioned any jerking or convulsive activity. She would be diaphoretic when she wakes up and states that she is not passed out for a long period of time. No focal weakness, she just wants to lay down and sleep.    For the past several months, she feels dizzy "all the time," worse when turning fast. She has neck and back pain. No headaches, diplopia, dysarthria/dysphagia. She has had fecal incontinence for the past 6-7 months and wears adult diapers, she is unaware she had a BM. This is not daily, it occurred last night and a couple of weeks ago. She has urinary incontinence at night but not during the day. She denies any chest pain, shortness of breath, or palpitations. No staring episodes, olfactory/gustatory hallucinations, rising epigastric sensation, myoclonic jerks. She had a normal birth and early development.  There is no history of febrile convulsions, CNS infections such as meningitis/encephalitis, significant traumatic brain injury, neurosurgical procedures, or family history of seizures.   I personally reviewed MRI brain without contrast done 05/2019 which did not show any acute changes, there was mild diffuse atrophy and mild to moderate chronic  microvascular disease.  Allergies  Allergen Reactions   Gadolinium Derivatives Itching and Other (See Comments)    Redness and itching, skins feels like it was burning. In the future pt will need pre med.   Lyrica [Pregabalin]     "FELT WEIRD"   Prozac [Fluoxetine Hcl] Other (See Comments)    "COULDN'T LIFT HEAD AND GET OUT OF BED"    Current Outpatient Medications  Medication Instructions   buPROPion (WELLBUTRIN XL) 300 mg, Every morning   calcium carbonate (TUMS - DOSED IN MG ELEMENTAL CALCIUM) 500 MG chewable tablet 1 tablet, As needed   clonazePAM (KLONOPIN) 1 mg, Daily at bedtime   ezetimibe (ZETIA) 10 mg, Oral, Daily   famotidine (PEPCID) 40 mg, Oral, Daily at bedtime   fluticasone (FLONASE) 50 MCG/ACT nasal spray Each Nare, Daily   meloxicam (MOBIC) 15 mg, Oral, Daily   naproxen sodium (ALEVE) 220 mg, Oral, 2 times daily PRN  POTASSIUM GLUCONATE PO Oral   rosuvastatin (CRESTOR) 10 mg, Oral, Daily   simvastatin (ZOCOR) 20 mg, Oral, Daily   TRAZODONE HCL PO 200 mg, Daily at bedtime   venlafaxine (EFFEXOR) 75 mg, Oral, Daily     VITALS:  There were no vitals filed for this visit. No flowsheet data found.  PHYSICAL EXAM   HEENT:  Normocephalic, atraumatic. The mucous membranes are moist. The superficial temporal arteries are without ropiness or tenderness. Cardiovascular: Regular rate and rhythm. Lungs: Clear to auscultation bilaterally. Neck: There are no carotid bruits noted bilaterally.  NEUROLOGICAL: [place] Cognitive Assessment  09/08/2021  Visuospatial/ Executive (0/5) 2  Naming (0/3) 2  Attention: Read list of digits (0/2) 1  Attention: Read list of letters (0/1) 1  Attention: Serial 7 subtraction starting at 100 (0/3) 0  Language: Repeat phrase (0/2) 1  Language : Fluency (0/1) 0  Abstraction (0/2) 0  Delayed Recall (0/5) 0  Orientation (0/6) 5  Total 12  Adjusted Score (based on education) 13   No flowsheet data found.  No flowsheet data found.    Orientation:  Alert and oriented to person, place and time. No aphasia or dysarthria. Fund of knowledge is appropriate. Recent memory impaired and remote memory intact.  Attention and concentration are reduced.  Able to name objects 3/3 and repeat phrases. Delayed recall  0/5 Cranial nerves: There is good facial symmetry. Extraocular muscles are intact and visual fields are full to confrontational testing. Speech is fluent and clear. Soft palate rises symmetrically and there is no tongue deviation. Hearing is intact to conversational tone. Tone: Tone is good throughout. Sensation: Sensation is intact to light touch and pinprick throughout. Vibration is intact at the bilateral big toe.There is no extinction with double simultaneous stimulation. There is no sensory dermatomal level identified. Coordination: The patient has no difficulty with RAM's or FNF bilaterally. Normal finger to nose  Motor: Strength is 5/5 in the bilateral upper and lower extremities. There is no pronator drift. There are no fasciculations noted. DTR's: Deep tendon reflexes are 2/4 at the bilateral biceps, triceps, brachioradialis, patella and achilles.  Plantar responses are downgoing bilaterally. Gait and Station: The patient is able to ambulate without difficulty but steps are ataxic .The patient is able to heel toe walk without any difficulty.The patient is able to ambulate in a tandem fashion. The patient is able to stand in the Romberg position.     Thank you for allowing [place] the opportunity to participate in the care of this nice patient. Please do not hesitate to contact us for any questions or concerns.   Total time spent on today's visit was 60 minutes, including both face-to-face time and nonface-to-face time.  Time included that spent on review of records (prior notes available to me/labs/imaging if pertinent), discussing treatment and goals, answering patient's questions and coordinating care.  Cc:  Allison Neer, MD  Sharene Butters 09/08/2021 9:59 PM

## 2021-09-08 NOTE — Patient Instructions (Addendum)
It was a pleasure to see you today at our office.   Recommendations:  Neurocognitive evaluation at our office MRI of the brain, the radiology office will call you to arrange you appointment EEG Check labs today Follow up in 1 month  and after neurocognitive testing  RECOMMENDATIONS FOR ALL PATIENTS WITH MEMORY PROBLEMS: 1. Continue to exercise (Recommend 30 minutes of walking everyday, or 3 hours every week) 2. Increase social interactions - continue going to [place] and enjoy social gatherings with friends and family 3. Eat healthy, avoid fried foods and eat more fruits and vegetables 4. Maintain adequate blood pressure, blood sugar, and blood cholesterol level. Reducing the risk of stroke and cardiovascular disease also helps promoting better memory. 5. Avoid stressful situations. Live a simple life and avoid aggravations. Organize your time and prepare for the next day in anticipation. 6. Sleep well, avoid any interruptions of sleep and avoid any distractions in the bedroom that may interfere with adequate sleep quality 7. Avoid sugar, avoid sweets as there is a strong link between excessive sugar intake, diabetes, and cognitive impairment We discussed the Mediterranean diet, which has been shown to help patients reduce the risk of progressive memory disorders and reduces cardiovascular risk. This includes eating fish, eat fruits and green leafy vegetables, nuts like almonds and hazelnuts, walnuts, and also use olive oil. Avoid fast foods and fried foods as much as possible. Avoid sweets and sugar as sugar use has been linked to worsening of memory function.  There is always a concern of gradual progression of memory problems. If this is the case, then we may need to adjust level of care according to patient needs. Support, both to the patient and caregiver, should then be put into place.      You have been referred for a neuropsychological evaluation (i.e., evaluation of memory and  thinking abilities). Please bring someone with you to this appointment if possible, as it is helpful for the doctor to hear from both you and another adult who knows you well. Please bring eyeglasses and hearing aids if you wear them.    The evaluation will take approximately 3 hours and has two parts:   The first part is a clinical interview with the neuropsychologist (Dr. Melvyn Novas or Dr. Nicole Kindred). During the interview, the neuropsychologist will speak with you and the individual you brought to the appointment.    The second part of the evaluation is testing with the doctor's technician Hinton Dyer or Maudie Mercury). During the testing, the technician will ask you to remember different types of material, solve problems, and answer some questionnaires. Your family member will not be present for this portion of the evaluation.   Please note: We must reserve several hours of the neuropsychologist's time and the psychometrician's time for your evaluation appointment. As such, there is a No-Show fee of $100. If you are unable to attend any of your appointments, please contact our office as soon as possible to reschedule.    FALL PRECAUTIONS: Be cautious when walking. Scan the area for obstacles that may increase the risk of trips and falls. When getting up in the mornings, sit up at the edge of the bed for a few minutes before getting out of bed. Consider elevating the bed at the head end to avoid drop of blood pressure when getting up. Walk always in a well-lit room (use night lights in the walls). Avoid area rugs or power cords from appliances in the middle of the walkways. Use a walker  or a cane if necessary and consider physical therapy for balance exercise. Get your eyesight checked regularly.  FINANCIAL OVERSIGHT: Supervision, especially oversight when making financial decisions or transactions is also recommended.  HOME SAFETY: Consider the safety of the kitchen when operating appliances like stoves, microwave oven,  and blender. Consider having supervision and share cooking responsibilities until no longer able to participate in those. Accidents with firearms and other hazards in the house should be identified and addressed as well.   ABILITY TO BE LEFT ALONE: If patient is unable to contact 911 operator, consider using LifeLine, or when the need is there, arrange for someone to stay with patients. Smoking is a fire hazard, consider supervision or cessation. Risk of wandering should be assessed by caregiver and if detected at any point, supervision and safe proof recommendations should be instituted.  MEDICATION SUPERVISION: Inability to self-administer medication needs to be constantly addressed. Implement a mechanism to ensure safe administration of the medications.   DRIVING: Regarding driving, in patients with progressive memory problems, driving will be impaired. We advise to have someone else do the driving if trouble finding directions or if minor accidents are reported. Independent driving assessment is available to determine safety of driving.   If you are interested in the driving assessment, you can contact the following:  [employer] in [place]  [place] [phone] or [phone]    [place] refers to food and lifestyle choices that are based on the traditions of countries located on the [employer]. This way of eating has been shown to help prevent certain conditions and improve outcomes for people who have chronic diseases, like kidney disease and heart disease. What are tips for following this plan? Lifestyle  Cook and eat meals together with your family, when possible. Drink enough fluid to keep your urine clear or pale yellow. Be physically active every day. This includes: Aerobic exercise like running or swimming. Leisure  activities like gardening, walking, or housework. Get 7-8 hours of sleep each night. If recommended by your health care provider, drink red wine in moderation. This means 1 glass a day for nonpregnant women and 2 glasses a day for men. A glass of wine equals 5 oz (150 mL). Reading food labels  Check the serving size of packaged foods. For foods such as rice and pasta, the serving size refers to the amount of cooked product, not dry. Check the total fat in packaged foods. Avoid foods that have saturated fat or trans fats. Check the ingredients list for added sugars, such as corn syrup. Shopping  At the grocery store, buy most of your food from the areas near the walls of the store. This includes: Fresh fruits and vegetables (produce). Grains, beans, nuts, and seeds. Some of these may be available in unpackaged forms or large amounts (in bulk). Fresh seafood. Poultry and eggs. Low-fat dairy products. Buy whole ingredients instead of prepackaged foods. Buy fresh fruits and vegetables in-season from local farmers markets. Buy frozen fruits and vegetables in resealable bags. If you do not have access to quality fresh seafood, buy precooked frozen shrimp or canned fish, such as tuna, salmon, or sardines. Buy small amounts of raw or cooked vegetables, salads, or olives from the deli or salad bar at your store. Stock your pantry so you always have certain foods on hand, such as olive oil, canned tuna, canned tomatoes, rice, pasta, and beans.  Cooking  Cook foods with extra-virgin olive oil instead of using butter or other vegetable oils. Have meat as a side dish, and have vegetables or grains as your main dish. This means having meat in small portions or adding small amounts of meat to foods like pasta or stew. Use beans or vegetables instead of meat in common dishes like chili or lasagna. Experiment with different cooking methods. Try roasting or broiling vegetables instead of steaming or sauteing  them. Add frozen vegetables to soups, stews, pasta, or rice. Add nuts or seeds for added healthy fat at each meal. You can add these to yogurt, salads, or vegetable dishes. Marinate fish or vegetables using olive oil, lemon juice, garlic, and fresh herbs. Meal planning  Plan to eat 1 vegetarian meal one day each week. Try to work up to 2 vegetarian meals, if possible. Eat seafood 2 or more times a week. Have healthy snacks readily available, such as: Vegetable sticks with hummus. Greek yogurt. Fruit and nut trail mix. Eat balanced meals throughout the week. This includes: Fruit: 2-3 servings a day Vegetables: 4-5 servings a day Low-fat dairy: 2 servings a day Fish, poultry, or lean meat: 1 serving a day Beans and legumes: 2 or more servings a week Nuts and seeds: 1-2 servings a day Whole grains: 6-8 servings a day Extra-virgin olive oil: 3-4 servings a day Limit red meat and sweets to only a few servings a month What are my food choices? Mediterranean diet Recommended Grains: Whole-grain pasta. Brown rice. Bulgar wheat. Polenta. Couscous. Whole-wheat bread. [place]. Vegetables: Artichokes. Beets. Broccoli. Cabbage. Carrots. Eggplant. Green beans. Chard. Kale. Spinach. Onions. Leeks. Peas. Squash. Tomatoes. Peppers. Radishes. Fruits: Apples. Apricots. Avocado. Berries. Bananas. Cherries. Dates. Figs. Grapes. Lemons. Melon. Oranges. Peaches. Plums. Pomegranate. Meats and other protein foods: Beans. Almonds. Sunflower seeds. Pine nuts. Peanuts. [place]. Salmon. Scallops. Shrimp. [place]. Tilapia. Clams. Oysters. Eggs. Dairy: Low-fat milk. Cheese. Greek yogurt. Beverages: Water. Red wine. Herbal tea. Fats and oils: Extra virgin olive oil. Avocado oil. Grape seed oil. Sweets and desserts: [place] yogurt with honey. Baked apples. Poached pears. Trail mix. Seasoning and other foods: Basil. Cilantro. Coriander. Cumin. Mint. Parsley. Sage. Rosemary. Tarragon. Garlic. Oregano. Thyme. Pepper.  Balsalmic vinegar. Tahini. Hummus. Tomato sauce. Olives. Mushrooms. Limit these Grains: Prepackaged pasta or rice dishes. Prepackaged cereal with added sugar. Vegetables: Deep fried potatoes (french fries). Fruits: Fruit canned in syrup. Meats and other protein foods: Beef. Pork. Lamb. Poultry with skin. Hot dogs. [place] Salines. Dairy: Ice cream. Sour cream. Whole milk. Beverages: Juice. Sugar-sweetened soft drinks. Beer. Liquor and spirits. Fats and oils: Butter. Canola oil. Vegetable oil. Beef fat (tallow). Lard. Sweets and desserts: Cookies. Cakes. Pies. Candy. Seasoning and other foods: Mayonnaise. Premade sauces and marinades. The items listed may not be a complete list. Talk with your dietitian about what dietary choices are right for you. Summary The Mediterranean diet includes both food and lifestyle choices. Eat a variety of fresh fruits and vegetables, beans, nuts, seeds, and whole grains. Limit the amount of red meat and sweets that you eat. Talk with your health care provider about whether it is safe for you to drink red wine in moderation. This means 1 glass a day for nonpregnant women and 2 glasses a day for men. A glass of wine equals 5 oz (150 mL). This information is not intended to replace advice given to you by your health care provider. Make sure you discuss any questions you have with your health care provider. Document Released:  05/11/2016 Document Revised: 06/13/2016 Document Reviewed: 05/11/2016 Elsevier Interactive Patient Education  2017 [employer].

## 2021-09-14 ENCOUNTER — Other Ambulatory Visit: Payer: Medicare Other

## 2021-09-14 LAB — VITAMIN B1: Vitamin B1 (Thiamine): <6 nmol/L — ABNORMAL LOW (ref 8–30)

## 2021-09-16 ENCOUNTER — Telehealth: Payer: Self-pay | Admitting: Physician Assistant

## 2021-09-16 NOTE — Telephone Encounter (Signed)
Pt called in returning a call from [place] about results

## 2021-09-21 ENCOUNTER — Other Ambulatory Visit: Payer: Self-pay

## 2021-09-21 ENCOUNTER — Ambulatory Visit (INDEPENDENT_AMBULATORY_CARE_PROVIDER_SITE_OTHER): Payer: Medicare Other | Admitting: Neurology

## 2021-09-21 DIAGNOSIS — R413 Other amnesia: Secondary | ICD-10-CM

## 2021-09-21 NOTE — Procedures (Signed)
ELECTROENCEPHALOGRAM REPORT  Date of Study: 09/21/2021  Patient's Name: Allison Pierce MRN: [id number] Date of Birth: 1945/10/23  Referring Provider: Sharene Butters, PA-C  Clinical History: This is a 75 year old woman with episodes of going blank. EEG for classification.  Medications: WELLBUTRIN XL 300 MG 24 hr tablet TUMS - DOSED IN MG ELEMENTAL CALCIUM 500 MG chewable tablet KLONOPIN 1 MG tablet ZETIA   s20 10 mg PEPCID  40 mg FLONASE 50 MCG/ACT nasal spray MOBIC 15 MG tablet ALEVE  220 mg POTASSIUM GLUCONATE PO CRESTOR  10 mg ZOCOR 20 MG tablet EFFEXOR  75 mg TRAZODONE HCL PO  Take 200 mg  Technical Summary: A multichannel digital EEG recording measured by the international 10-20 system with electrodes applied with paste and impedances below 5000 ohms performed in our laboratory with EKG monitoring in an awake and asleep patient.  Hyperventilation was not performed. Photic stimulation was performed.  The digital EEG was referentially recorded, reformatted, and digitally filtered in a variety of bipolar and referential montages for optimal display.    Description: The patient is awake and asleep during the recording.  During maximal wakefulness, there is a symmetric, medium voltage 9-9.5 Hz posterior dominant rhythm that attenuates with eye opening.  The record is symmetric.  During drowsiness and sleep, there is an increase in theta slowing of the background.  Vertex waves and symmetric sleep spindles were seen.  Photic stimulation did not elicit any abnormalities.  There were no epileptiform discharges or electrographic seizures seen.    EKG lead was unremarkable.  Impression: This awake and asleep EEG is normal.    Clinical Correlation: A normal EEG does not exclude a clinical diagnosis of epilepsy.  If further clinical questions remain, prolonged EEG may be helpful.  Clinical correlation is advised.   Ellouise Newer, M.D.

## 2021-09-29 ENCOUNTER — Other Ambulatory Visit: Payer: Self-pay

## 2021-09-29 ENCOUNTER — Ambulatory Visit
Admission: RE | Admit: 2021-09-29 | Discharge: 2021-09-29 | Disposition: A | Discharge status: Home / Self Care | Payer: Medicare Other | Source: Ambulatory Visit | Attending: Physician Assistant | Admitting: Physician Assistant

## 2021-09-29 DIAGNOSIS — R413 Other amnesia: Secondary | ICD-10-CM | POA: Diagnosis not present

## 2021-09-29 DIAGNOSIS — Z9889 Other specified postprocedural states: Secondary | ICD-10-CM | POA: Diagnosis not present

## 2021-09-29 DIAGNOSIS — G43709 Chronic migraine without aura, not intractable, without status migrainosus: Secondary | ICD-10-CM | POA: Diagnosis not present

## 2021-10-06 ENCOUNTER — Encounter: Payer: Self-pay | Admitting: Physician Assistant

## 2021-10-06 ENCOUNTER — Other Ambulatory Visit: Payer: Self-pay

## 2021-10-06 ENCOUNTER — Ambulatory Visit (INDEPENDENT_AMBULATORY_CARE_PROVIDER_SITE_OTHER): Payer: Medicare Other | Admitting: Physician Assistant

## 2021-10-06 VITALS — BP 166/79 | HR 87 | Resp 18 | Wt 157.0 lb

## 2021-10-06 DIAGNOSIS — F0390 Unspecified dementia without behavioral disturbance: Secondary | ICD-10-CM

## 2021-10-06 MED ORDER — DONEPEZIL HCL 10 MG PO TABS: ORAL_TABLET | ORAL | 3 refills | Status: DC

## 2021-10-06 NOTE — Progress Notes (Signed)
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 Side effects discussed   Discussed safety both in and out of the home.  Discussed the importance of regular daily schedule with inclusion of crossword puzzles to maintain brain function.  Continue to monitor mood with PCP.  Stay active at least 30 minutes at least 3 times a week.  Naps should be scheduled and should be no longer than 60 minutes and should not occur after 2 PM.  Mediterranean diet is recommended  Folllow up once results above are available   Case discussed with Dr. Delice Lesch  Subjective:   This is a delightful 76 y.o. year old right-handed female with a history of hypertension, hyperlipidemia, anxiety, depression, alcohol habituation, seen today in follow-up for evaluation of memory loss due to mild dementia without behavioral disturbance.  MRI of the brain without contrast 09/30/2021 remarkable for moderate parenchymal volume loss, mildly progressed from prior MRI.  EEG was normal.  The patient is here alone.  Since her last visit, the patient reports that her memory is about the same.  She continues to repeat the same stories and asking the same questions, and continues to find herself disoriented at times where to go when driving.  She denies leaving objects in unusual places, but does misplace her keys or phone.  She continues to feel depressed, did not enjoy the holidays, or bother wrapping presents like she used to do before.  "I did  nothing for this year ".  She sleeps well, denies any nightmares recently or sleepwalking.  Denies any hallucinations or paranoia.  She does admit to "a little bit of hoarding ".  She denies any issues with personal hygiene, she is independent of bathing and dressing.  She denies missing any medications.  Her husband is mostly in control of the finances.  Her appetite is good, denies trouble swallowing, cooks and denies leaving the stove on.  She ambulates without significant difficulty, occasionally "she stumbles a little ".  She denies any falls or head injuries.  She denies any recent headaches, or recent syncope.  She denies any focal numbness or tingling, unilateral weakness or tremors.  No recent COVID.  She has a history of urinary incontinence and wears diapers.  She denies constipation or diarrhea.  She continues to drink 4 or 5 beers a day, but she denies drinking any more vodka.  She denies any tobacco.     History on Initial Assessment 06/02/2019: This is a pleasant 76 year old right-handed woman with a history of hyperlipidemia, hypertension, ADHD, mild cognitive impairment, presenting for evaluation of recurrent syncope. The first episode occurred in September 2019, she reports she was overheated in the day and felt fine, then that evening around 2 hours later as she was getting ready to go to bed, she started feeling weak and went down to the floor, her husband heard a sound, she told him she was not feeling well and he helped her to the bathroom where she passed out then had vomiting and diarrhea. She woke up  to her husband screaming at her to talk to him. Her husband helped her clean up and she fell asleep. She continued to feel tired and off balance for a few days after. She had another episode Christmas 2019, she got up from the den to go to the bedroom then she passed out and hit the tile, her face was black and blue. There was no vomiting and diarrhea this time. She had another episode in  January, and again in July. She reports they have occurred if she gets very hot or overheated in the day, then that evening after midnight she would have the same symptoms with associated vomiting and diarrhea. With the most recent one, she got up to use the bathroom and her husband could tell she was going and caught her. There is no prior warning, she wakes up with her husband cleaning her up. Her husband has not mentioned any jerking or convulsive activity. She would be diaphoretic when she wakes up and states that she is not passed out for a long period of time. No focal weakness, she just wants to lay down and sleep.    For the past several months, she feels dizzy "all the time," worse when turning fast. She has neck and back pain. No headaches, diplopia, dysarthria/dysphagia. She has had fecal incontinence for the past 6-7 months and wears adult diapers, she is unaware she had a BM. This is not daily, it occurred last night and a couple of weeks ago. She has urinary incontinence at night but not during the day. She denies any chest pain, shortness of breath, or palpitations. No staring episodes, olfactory/gustatory hallucinations, rising epigastric sensation, myoclonic jerks. She had a normal birth and early development.  There is no history of febrile convulsions, CNS infections such as meningitis/encephalitis, significant traumatic brain injury, neurosurgical procedures, or family history of seizures.   I personally reviewed MRI brain without contrast done 05/2019 which did not show any acute changes, there was mild diffuse atrophy and mild to moderate chronic microvascular disease.  EEG  on 09/21/21 was normal   Pertinent labs 09/08/2021 vitamin B12 313, TSH 4.58, vitamin B1 less than 6 (low )  MRI of the brain without contrast 09/30/2021 remarkable for moderate parenchymal volume loss, mildly progressed from prior MRI.  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 In the future pt will need pre med.   Prozac [Fluoxetine Hcl] Other (See Comments)    "COULDN'T LIFT HEAD AND GET OUT OF BED"   Pregabalin Anxiety    "FELT WEIRD"    Current Outpatient Medications  Medication Instructions   buPROPion (WELLBUTRIN XL) 300 mg, Every morning   calcium carbonate (TUMS - DOSED IN MG ELEMENTAL CALCIUM) 500 MG chewable tablet 1 tablet, As needed   clonazePAM (KLONOPIN) 1 mg, Daily at bedtime   ezetimibe (ZETIA) 10 mg, Oral, Daily   famotidine (PEPCID) 40 mg, Oral, Daily at bedtime   fluticasone (FLONASE) 50 MCG/ACT nasal spray Each Nare, Daily   meloxicam (MOBIC) 15 mg, Oral, Daily   naproxen sodium (ALEVE) 220 mg, Oral, 2 times daily PRN   POTASSIUM GLUCONATE PO Oral   rosuvastatin (CRESTOR) 10 mg, Oral, Daily   simvastatin (ZOCOR) 20 mg, Oral, Daily   TRAZODONE HCL PO 200 mg, Daily at bedtime   venlafaxine (EFFEXOR) 75 mg, Oral, Daily  VITALS:   Vitals:   10/06/21 1429  BP: (!) 166/79  Pulse: 87  Resp: 18  SpO2: 99%  Weight: 157 lb (71.2 kg)   No flowsheet data found.  PHYSICAL EXAM   HEENT:  Normocephalic, atraumatic. The mucous membranes are moist. The superficial temporal arteries are without ropiness or tenderness. Cardiovascular: Regular rate and rhythm. Lungs: Clear to auscultation bilaterally. Neck: There are no carotid bruits noted bilaterally.  NEUROLOGICAL: Montreal Cognitive Assessment  09/08/2021  Visuospatial/ Executive (0/5) 2  Naming (0/3) 2  Attention: Read list of digits (0/2) 1  Attention: Read list of letters (0/1) 1  Attention: Serial 7 subtraction starting at 100 (0/3) 0  Language: Repeat phrase (0/2) 1  Language : Fluency (0/1) 0  Abstraction (0/2) 0  Delayed Recall (0/5) 0  Orientation (0/6) 5  Total 12  Adjusted Score (based on education) 13   No flowsheet data found.  No  flowsheet data found.   Orientation:  Alert and oriented to person, place and time. No aphasia or dysarthria. Fund of knowledge is reduced. Recent and remote memory intact.  Attention and concentration are reduced.  Able to name objects and repeat phrases.  Cranial nerves: There is good facial symmetry. Extraocular muscles are intact and visual fields are full to confrontational testing. Speech is fluent and clear. Soft palate rises symmetrically and there is no tongue deviation. Hearing is intact to conversational tone. Tone: Tone is good throughout. Sensation: Sensation is intact to light touch and pinprick throughout. Vibration is intact at the bilateral big toe.There is no extinction with double simultaneous stimulation. There is no sensory dermatomal level identified. Coordination: The patient has no difficulty with RAM's or FNF bilaterally. Normal finger to nose  Motor: Strength is 5/5 in the bilateral upper and lower extremities. There is no pronator drift. There are no fasciculations noted. DTR's: Deep tendon reflexes are 2/4 at the bilateral biceps, triceps, brachioradialis, patella and achilles.  Plantar responses are downgoing bilaterally. Gait and Station: The patient is able to ambulate without difficulty but steps are mildly ataxic .The patient is able to heel toe walk without any difficulty.The patient is able to ambulate in a tandem fashion. The patient is able to stand in the Romberg position.     Thank you for allowing [place] the opportunity to participate in the care of this nice patient. Please do not hesitate to contact us for any questions or concerns.   Total time spent on today's visit was 30 minutes, including both face-to-face time and nonface-to-face time.  Time included that spent on review of records (prior notes available to me/labs/imaging if pertinent), discussing treatment and goals, answering patient's questions and coordinating care.  Cc:  [name], MD  Sharene Butters 10/06/2021 2:44 PM

## 2021-10-06 NOTE — Patient Instructions (Addendum)
It was a pleasure to see you today at our office.   Recommendations:  Follow up in 6  months Keep the appointment in June with Dr. Melvyn Novas  (Neuropsychological testing)  Start taking B12 and B1 supplements over the counter to help optimize the memory  We will start donepezil half tablet (5mg ) daily for 2  weeks.  If you are tolerating the medication, then after 2 weeks, we will increase the dose to a full tablet of 10 mg daily.  Side effects include  diarrhea, vivid dreams, and muscle cramps.  Please call the clinic if you experience any of these symptoms.  Reduce the amount of beers a day    RECOMMENDATIONS FOR ALL PATIENTS WITH MEMORY PROBLEMS: 1. Continue to exercise (Recommend 30 minutes of walking everyday, or 3 hours every week) 2. Increase social interactions - continue going to [place] and enjoy social gatherings with friends and family 3. Eat healthy, avoid fried foods and eat more fruits and vegetables 4. Maintain adequate blood pressure, blood sugar, and blood cholesterol level. Reducing the risk of stroke and cardiovascular disease also helps promoting better memory. 5. Avoid stressful situations. Live a simple life and avoid aggravations. Organize your time and prepare for the next day in anticipation. 6. Sleep well, avoid any interruptions of sleep and avoid any distractions in the bedroom that may interfere with adequate sleep quality 7. Avoid sugar, avoid sweets as there is a strong link between excessive sugar intake, diabetes, and cognitive impairment We discussed the Mediterranean diet, which has been shown to help patients reduce the risk of progressive memory disorders and reduces cardiovascular risk. This includes eating fish, eat fruits and green leafy vegetables, nuts like almonds and hazelnuts, walnuts, and also use olive oil. Avoid fast foods and fried foods as much as possible. Avoid sweets and sugar as sugar use has been linked to worsening of memory function.  There is  always a concern of gradual progression of memory problems. If this is the case, then we may need to adjust level of care according to patient needs. Support, both to the patient and caregiver, should then be put into place.    FALL PRECAUTIONS: Be cautious when walking. Scan the area for obstacles that may increase the risk of trips and falls. When getting up in the mornings, sit up at the edge of the bed for a few minutes before getting out of bed. Consider elevating the bed at the head end to avoid drop of blood pressure when getting up. Walk always in a well-lit room (use night lights in the walls). Avoid area rugs or power cords from appliances in the middle of the walkways. Use a walker or a cane if necessary and consider physical therapy for balance exercise. Get your eyesight checked regularly.  FINANCIAL OVERSIGHT: Supervision, especially oversight when making financial decisions or transactions is also recommended.  HOME SAFETY: Consider the safety of the kitchen when operating appliances like stoves, microwave oven, and blender. Consider having supervision and share cooking responsibilities until no longer able to participate in those. Accidents with firearms and other hazards in the house should be identified and addressed as well.   ABILITY TO BE LEFT ALONE: If patient is unable to contact 911 operator, consider using LifeLine, or when the need is there, arrange for someone to stay with patients. Smoking is a fire hazard, consider supervision or cessation. Risk of wandering should be assessed by caregiver and if detected at any point, supervision and safe proof  recommendations should be instituted.  MEDICATION SUPERVISION: Inability to self-administer medication needs to be constantly addressed. Implement a mechanism to ensure safe administration of the medications.   DRIVING: Regarding driving, in patients with progressive memory problems, driving will be impaired. We advise to have  someone else do the driving if trouble finding directions or if minor accidents are reported. Independent driving assessment is available to determine safety of driving.   If you are interested in the driving assessment, you can contact the following:  [employer] in [place]  [place] [phone]  [place]  [facility] [phone] or [phone]

## 2021-10-24 ENCOUNTER — Telehealth: Payer: Self-pay | Admitting: Psychology

## 2021-10-24 NOTE — Telephone Encounter (Signed)
Patient called requesting a call back about her donepezil.   She said she went up to 10 MG and it made her feel strange. She had to lay down when she got out of the shower and didn't feel normal until the next day.  She'd like to know if she should continue taking 10MG  or stick with 5MG ?

## 2021-10-25 NOTE — Telephone Encounter (Signed)
Please advise 

## 2021-10-25 NOTE — Telephone Encounter (Signed)
Patient advised to stay at 5mg  daily

## 2021-11-21 ENCOUNTER — Telehealth: Payer: Self-pay | Admitting: Physician Assistant

## 2021-11-21 NOTE — Telephone Encounter (Signed)
Patient is still having issues taking donepezil 10mg . She took 1/2 of pill with same problems. Cramps, problems with urinating and diarrhea.

## 2021-11-22 NOTE — Telephone Encounter (Signed)
Pt advised of med to call back next week.

## 2021-11-22 NOTE — Telephone Encounter (Signed)
The following message was left with [employer] on 11/22/21 at 1:05PM.   Patient returned call to [place].

## 2021-11-22 NOTE — Telephone Encounter (Signed)
Left message to call office at [phone] 11/22/2021

## 2021-11-22 NOTE — Telephone Encounter (Signed)
No answer at 12:01 11/22/2021

## 2021-11-22 NOTE — Telephone Encounter (Signed)
I left message to contact office at [phone] 11/22/2021

## 2021-12-31 DIAGNOSIS — C801 Malignant (primary) neoplasm, unspecified: Secondary | ICD-10-CM

## 2021-12-31 HISTORY — DX: Malignant (primary) neoplasm, unspecified: C80.1

## 2022-01-12 ENCOUNTER — Other Ambulatory Visit (HOSPITAL_COMMUNITY)
Admission: RE | Admit: 2022-01-12 | Discharge: 2022-01-12 | Disposition: A | Discharge status: Home / Self Care | Payer: Medicare Other | Source: Ambulatory Visit | Attending: Nurse Practitioner | Admitting: Nurse Practitioner

## 2022-01-12 ENCOUNTER — Other Ambulatory Visit: Payer: Self-pay | Admitting: Nurse Practitioner

## 2022-01-12 DIAGNOSIS — L731 Pseudofolliculitis barbae: Secondary | ICD-10-CM | POA: Diagnosis not present

## 2022-01-12 DIAGNOSIS — Z1151 Encounter for screening for human papillomavirus (HPV): Secondary | ICD-10-CM | POA: Insufficient documentation

## 2022-01-12 DIAGNOSIS — N898 Other specified noninflammatory disorders of vagina: Secondary | ICD-10-CM | POA: Diagnosis not present

## 2022-01-12 DIAGNOSIS — Z01419 Encounter for gynecological examination (general) (routine) without abnormal findings: Secondary | ICD-10-CM | POA: Diagnosis not present

## 2022-01-12 DIAGNOSIS — Z87411 Personal history of vaginal dysplasia: Secondary | ICD-10-CM | POA: Diagnosis not present

## 2022-01-12 DIAGNOSIS — I10 Essential (primary) hypertension: Secondary | ICD-10-CM | POA: Diagnosis not present

## 2022-01-12 DIAGNOSIS — Z87412 Personal history of vulvar dysplasia: Secondary | ICD-10-CM | POA: Diagnosis not present

## 2022-01-12 DIAGNOSIS — R3915 Urgency of urination: Secondary | ICD-10-CM | POA: Diagnosis not present

## 2022-01-13 DIAGNOSIS — I10 Essential (primary) hypertension: Secondary | ICD-10-CM | POA: Diagnosis not present

## 2022-01-14 DIAGNOSIS — Z20822 Contact with and (suspected) exposure to covid-19: Secondary | ICD-10-CM | POA: Diagnosis not present

## 2022-01-17 LAB — CYTOLOGY - PAP
Comment: NEGATIVE
Diagnosis: NEGATIVE
High risk HPV: NEGATIVE

## 2022-01-26 ENCOUNTER — Other Ambulatory Visit: Payer: Self-pay

## 2022-01-26 ENCOUNTER — Other Ambulatory Visit: Payer: Self-pay | Admitting: Family Medicine

## 2022-01-26 DIAGNOSIS — N6320 Unspecified lump in the left breast, unspecified quadrant: Secondary | ICD-10-CM

## 2022-01-26 DIAGNOSIS — N63 Unspecified lump in unspecified breast: Secondary | ICD-10-CM | POA: Diagnosis not present

## 2022-01-30 DIAGNOSIS — Z20822 Contact with and (suspected) exposure to covid-19: Secondary | ICD-10-CM | POA: Diagnosis not present

## 2022-02-04 ENCOUNTER — Ambulatory Visit
Admission: RE | Admit: 2022-02-04 | Discharge: 2022-02-04 | Disposition: A | Discharge status: Home / Self Care | Payer: Medicare Other | Source: Ambulatory Visit | Attending: Family Medicine | Admitting: Family Medicine

## 2022-02-04 ENCOUNTER — Other Ambulatory Visit: Payer: Self-pay | Admitting: Family Medicine

## 2022-02-04 DIAGNOSIS — N6320 Unspecified lump in the left breast, unspecified quadrant: Secondary | ICD-10-CM

## 2022-02-04 DIAGNOSIS — R921 Mammographic calcification found on diagnostic imaging of breast: Secondary | ICD-10-CM | POA: Diagnosis not present

## 2022-02-04 DIAGNOSIS — N6002 Solitary cyst of left breast: Secondary | ICD-10-CM | POA: Diagnosis not present

## 2022-02-10 ENCOUNTER — Ambulatory Visit
Admission: RE | Admit: 2022-02-10 | Discharge: 2022-02-10 | Disposition: A | Discharge status: Home / Self Care | Payer: Medicare Other | Source: Ambulatory Visit | Attending: Family Medicine | Admitting: Family Medicine

## 2022-02-10 DIAGNOSIS — N6321 Unspecified lump in the left breast, upper outer quadrant: Secondary | ICD-10-CM | POA: Diagnosis not present

## 2022-02-10 DIAGNOSIS — N6322 Unspecified lump in the left breast, upper inner quadrant: Secondary | ICD-10-CM | POA: Diagnosis not present

## 2022-02-10 DIAGNOSIS — N6320 Unspecified lump in the left breast, unspecified quadrant: Secondary | ICD-10-CM

## 2022-02-10 DIAGNOSIS — C50412 Malignant neoplasm of upper-outer quadrant of left female breast: Secondary | ICD-10-CM | POA: Diagnosis not present

## 2022-02-10 DIAGNOSIS — C50812 Malignant neoplasm of overlapping sites of left female breast: Secondary | ICD-10-CM | POA: Diagnosis not present

## 2022-02-10 HISTORY — PX: BREAST BIOPSY: SHX20

## 2022-02-14 ENCOUNTER — Telehealth: Payer: Self-pay | Admitting: Hematology and Oncology

## 2022-02-14 NOTE — Telephone Encounter (Signed)
Spoke to patient to confirm morning clinic appointment for 5/24, packet will be mailed to patient  ?

## 2022-02-20 ENCOUNTER — Encounter: Payer: Self-pay | Admitting: *Deleted

## 2022-02-20 DIAGNOSIS — C50212 Malignant neoplasm of upper-inner quadrant of left female breast: Secondary | ICD-10-CM | POA: Insufficient documentation

## 2022-02-20 DIAGNOSIS — Z171 Estrogen receptor negative status [ER-]: Secondary | ICD-10-CM

## 2022-02-20 DIAGNOSIS — C50412 Malignant neoplasm of upper-outer quadrant of left female breast: Secondary | ICD-10-CM | POA: Insufficient documentation

## 2022-02-21 ENCOUNTER — Other Ambulatory Visit: Payer: Self-pay | Admitting: *Deleted

## 2022-02-21 DIAGNOSIS — Z171 Estrogen receptor negative status [ER-]: Secondary | ICD-10-CM

## 2022-02-21 NOTE — Progress Notes (Signed)
[place] NOTE  Patient Care Team: [name], MD as PCP - General (Family Medicine) Buford Dresser, MD as PCP - Cardiology (Cardiology) Cameron Sprang, MD as Consulting Physician (Neurology) Mauro Kaufmann, RN as Oncology Nurse Navigator Rockwell Germany, RN as Oncology Nurse Navigator Coralie Keens, MD as Consulting Physician (General Surgery) Nicholas Lose, MD as Consulting Physician (Hematology and Oncology) Eppie Gibson, MD as Attending Physician (Radiation Oncology)  CHIEF COMPLAINTS/PURPOSE OF CONSULTATION:  Newly diagnosed breast cancer  HISTORY OF PRESENTING ILLNESS:  Allison Pierce 76 y.o. female is here because of recent diagnosis of left breast cancer. Screening mammogram detected Multiple palpable lumps within the LEFT breast.  Left breast mammogram and ultrasound revealed 2 masses along with calcifications.  There were 1.8 cm and 1.7 cm.  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  The calcifications have not been biopsied. She was presented this morning to the multidisciplinary tumor board and she is here today to discuss her treatment plan.  I reviewed her records extensively and collaborated the history with the patient.  SUMMARY OF ONCOLOGIC HISTORY: Oncology History  Malignant neoplasm of upper-outer quadrant of left breast in female, estrogen receptor negative ([place])  02/10/2022 Initial Diagnosis   Palpable left breast masses and calcifications: 1.8 cm at 1:00 and 1.7 cm at 9:00, calcifications not biopsy.  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, ER 0%, PR 0%, HER2 positive 3+, Ki-67 20%   02/22/2022 Cancer Staging   Staging form: Breast, AJCC 8th Edition - Clinical: Stage IA (cT1c, cN0, cM0, G2, ER-, PR-, HER2+) - Signed by Nicholas Lose, MD on 02/22/2022 Stage prefix:  Initial diagnosis Histologic grading system: 3 grade system       MEDICAL HISTORY:  Past Medical History:  Diagnosis Date   Anxiety    Depression    GERD (gastroesophageal reflux disease) OCCASIONALLY  TAKE TUMS   Hyperlipidemia    Hypertension    Vulvar lesion     SURGICAL HISTORY: Past Surgical History:  Procedure Laterality Date   ABDOMINAL HYSTERECTOMY  1992   W/ SALPINGO-OOPHORECTOMY   ANTERIOR CERVICAL DECOMP/DISCECTOMY FUSION  01-18-2009  DR POOLE   C4  - C6   AUGMENTATION MAMMAPLASTY Bilateral    BREAST BIOPSY Right    BREAST ENHANCEMENT SURGERY  2011   BREAST EXCISIONAL BIOPSY Left    BREAST SURGERY  02-04-2011  dr Margot Chimes   EXCISION LEFT BREAST MASS--  [place] REMOVAL  09/10/2012   Procedure: VULVAR LESION;  Surgeon: Selinda Orion, MD;  Location: [facility];  Service: Gynecology;  Laterality: N/A;  WIDE EXCISION OF VULVAR LESION    SOCIAL HISTORY: Social History   Socioeconomic History   Marital status: Married    Spouse name: Not on file   Number of children: Not on file   Years of education: 12   Highest education level: Not on file  Occupational History   Not on file  Tobacco Use   Smoking status: Never   Smokeless tobacco: Never  Vaping Use   Vaping Use: Never used  Substance and Sexual Activity   Alcohol use: Yes    Alcohol/week: 7.0 standard drinks    Types: 7 Cans of beer per week   Drug use: Not on file   Sexual activity: Not on file  Other Topics Concern  Not on file  Social History Narrative   Graduated HS      Right handed      Lives with husband   Social Determinants of Health   Financial Resource Strain: Not on file  Food Insecurity: Not on file  Transportation Needs: Not on file  Physical Activity: Not on file  Stress: Not on file  Social Connections: Not on file  Intimate Partner Violence: Not on file    FAMILY HISTORY: Family History  Problem Relation Age of Onset   Breast  cancer Cousin    Breast cancer Cousin     ALLERGIES:  is allergic to gadolinium derivatives, prozac [fluoxetine hcl], and pregabalin.  MEDICATIONS:  Current Outpatient Medications  Medication Sig Dispense Refill   buPROPion (WELLBUTRIN XL) 300 MG 24 hr tablet Take 300 mg by mouth every morning.     calcium carbonate (TUMS - DOSED IN MG ELEMENTAL CALCIUM) 500 MG chewable tablet Chew 1 tablet by mouth as needed.     clonazePAM (KLONOPIN) 1 MG tablet Take 1 mg by mouth at bedtime.     donepezil (ARICEPT) 10 MG tablet Take half tablet (5 mg) daily for 2 weeks, then increase to the full tablet at 10 mg daily 90 tablet 3   ezetimibe (ZETIA) 10 MG tablet Take 10 mg by mouth daily.     famotidine (PEPCID) 40 MG tablet Take 40 mg by mouth at bedtime.     fluticasone (FLONASE) 50 MCG/ACT nasal spray Place into both nostrils daily.     meloxicam (MOBIC) 15 MG tablet Take 15 mg by mouth daily.     naproxen sodium (ALEVE) 220 MG tablet Take 220 mg by mouth 2 (two) times daily as needed.     POTASSIUM GLUCONATE PO Take by mouth.     rosuvastatin (CRESTOR) 10 MG tablet Take 10 mg by mouth daily.     simvastatin (ZOCOR) 20 MG tablet Take 20 mg by mouth daily.     TRAZODONE HCL PO Take 200 mg by mouth at bedtime.     venlafaxine (EFFEXOR) 75 MG tablet Take 75 mg by mouth daily.     No current facility-administered medications for this visit.    REVIEW OF SYSTEMS:   Constitutional: Denies fevers, chills or abnormal night sweats Eyes: Denies blurriness of vision, double vision or watery eyes Ears, nose, mouth, throat, and face: Denies mucositis or sore throat Respiratory: Denies cough, dyspnea or wheezes Cardiovascular: Denies palpitation, chest discomfort or lower extremity swelling Gastrointestinal:  Denies nausea, heartburn or change in bowel habits Skin: Denies abnormal skin rashes Lymphatics: Denies new lymphadenopathy or easy bruising Neurological:Denies numbness, tingling or new  weaknesses Behavioral/Psych: Mood is stable, no new changes  Breast: Palpable lumps in the left breast All other systems were reviewed with the patient and are negative.  PHYSICAL EXAMINATION: ECOG PERFORMANCE STATUS: 1 - Symptomatic but completely ambulatory  Vitals:   02/22/22 0850  BP: (!) 166/88  Pulse: 79  Resp: 18  Temp: (!) 97.5 F (36.4 C)  SpO2: 99%   Filed Weights   02/22/22 0850  Weight: 159 lb 3.2 oz (72.2 kg)      LABORATORY DATA:  I have reviewed the data as listed Lab Results  Component Value Date   WBC 7.0 02/22/2022   HGB 12.0 02/22/2022   HCT 35.4 (L) 02/22/2022   MCV 85.3 02/22/2022   PLT 258 02/22/2022   Lab Results  Component Value Date   NA 136 02/22/2022   K  3.7 02/22/2022   CL 103 02/22/2022   CO2 27 02/22/2022    RADIOGRAPHIC STUDIES: I have personally reviewed the radiological reports and agreed with the findings in the report.  ASSESSMENT AND PLAN:  Malignant neoplasm of upper-outer quadrant of left breast in female, estrogen receptor negative ([place]) 02/10/2022:Palpable left breast masses and calcifications: 1.8 cm at 1:00 and 1.7 cm at 9:00, calcifications not biopsy.  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 After reviewing the pathology in detail, we proceeded to discuss the different treatment options between surgery, radiation, chemotherapy, antiestrogen therapies.  Treatment plan: 1.  Mastectomy with sentinel lymph node biopsy 2. adjuvant chemotherapy with Taxol [name] weekly x12 followed by [name] maintenance for 1 year 3.  Plus or minus radiation  Return to clinic after surgery to discuss the  final pathology report. Discussed the risks and benefits of chemotherapy.   All questions were answered. The patient knows to call the clinic with any problems, questions or concerns.    Harriette Ohara, MD 02/22/22  I Gardiner Coins am scribing for Dr. Lindi Adie  I have reviewed the above documentation for accuracy and completeness, and I agree with the above.

## 2022-02-21 NOTE — Progress Notes (Signed)
Radiation Oncology         [phone] ________________________________  Initial Outpatient Consultation  Name: Allison Pierce MRN: [id number]  Date: 02/22/2022  DOB: 04/12/1946  ER:DEYC, Nathen May, MD  Coralie Keens, MD   REFERRING PHYSICIAN: Coralie Keens, MD  DIAGNOSIS: No diagnosis found.  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  CHIEF COMPLAINT: Here to discuss management of left breast cancer  HISTORY OF PRESENT ILLNESS::Allison Pierce is a 76 y.o. female who presented with multiple palpable left breast lumps.   Subsequent left breast mammogram and left breast ultrasound performed for further evaluation on 02/04/22 revealed: a suspicious irregular hypoechoic mass in the 1 o'clock position of the left breast, 1 cm from the nipple, measuring 1.8 cm, with associated calcifications, corresponding to 1 of the palpable areas; an additional irregular hypoechoic area in the left breast at the 9 o'clock axis, 2 cm from the nipple, measuring 1.7 cm, corresponding to a second palpable area of concern; and linear grouped calcifications within the inner left breast. No enlarged or morphologically abnormal lymph nodes were appreciated.  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  ER status: 0% negative; PR status 0% negative; Proliferation marker Ki67 at 20%; Her2 status positive; Grade 2. No lymph nodes were examined.  Of note: the patient has a history of a right breast stereotactic biopsy in 2015 with pathology showing fibroadenoma and fibrocystic changes.  ***  PREVIOUS RADIATION THERAPY: No  PAST MEDICAL HISTORY:  has a past medical history of Anxiety, Depression, GERD (gastroesophageal reflux  disease) (OCCASIONALLY  TAKE TUMS), Hyperlipidemia, Hypertension, and Vulvar lesion.    PAST SURGICAL HISTORY: Past Surgical History:  Procedure Laterality Date   ABDOMINAL HYSTERECTOMY  1992   W/ SALPINGO-OOPHORECTOMY   ANTERIOR CERVICAL DECOMP/DISCECTOMY FUSION  01-18-2009  DR POOLE   C4  - C6   AUGMENTATION MAMMAPLASTY Bilateral    BREAST BIOPSY Right    BREAST ENHANCEMENT SURGERY  2011   BREAST EXCISIONAL BIOPSY Left    BREAST SURGERY  02-04-2011  dr Margot Chimes   EXCISION LEFT BREAST MASS--  [place] REMOVAL  09/10/2012   Procedure: VULVAR LESION;  Surgeon: Selinda Orion, MD;  Location: [facility];  Service: Gynecology;  Laterality: N/A;  WIDE EXCISION OF VULVAR LESION    FAMILY HISTORY: family history includes Breast cancer in her cousin and cousin.  SOCIAL HISTORY:  reports that she has never smoked. She has never used smokeless tobacco. She reports current alcohol use of about 7.0 standard drinks per week.  ALLERGIES: Gadolinium derivatives, Prozac [fluoxetine hcl], and Pregabalin  MEDICATIONS:  Current Outpatient Medications  Medication Sig Dispense Refill   buPROPion (WELLBUTRIN XL) 300 MG 24 hr tablet Take 300 mg by mouth every morning.     calcium carbonate (TUMS - DOSED IN MG ELEMENTAL CALCIUM) 500 MG chewable tablet Chew 1 tablet by mouth as needed.     clonazePAM (KLONOPIN) 1 MG tablet Take 1 mg by mouth at bedtime.     donepezil (ARICEPT) 10 MG tablet Take half tablet (5 mg) daily for 2 weeks, then increase to the full tablet at 10 mg daily 90 tablet 3   ezetimibe (ZETIA) 10 MG tablet Take  10 mg by mouth daily.     famotidine (PEPCID) 40 MG tablet Take 40 mg by mouth at bedtime.     fluticasone (FLONASE) 50 MCG/ACT nasal spray Place into both nostrils daily.     meloxicam (MOBIC) 15 MG tablet Take 15 mg by mouth daily.     naproxen sodium (ALEVE) 220 MG tablet Take 220 mg by mouth 2 (two) times daily as needed.     POTASSIUM  GLUCONATE PO Take by mouth.     rosuvastatin (CRESTOR) 10 MG tablet Take 10 mg by mouth daily.     simvastatin (ZOCOR) 20 MG tablet Take 20 mg by mouth daily.     TRAZODONE HCL PO Take 200 mg by mouth at bedtime.     venlafaxine (EFFEXOR) 75 MG tablet Take 75 mg by mouth daily.     No current facility-administered medications for this encounter.    REVIEW OF SYSTEMS: As above in HPI.   PHYSICAL EXAM:  vitals were not taken for this visit.   General: Alert and oriented, in no acute distress HEENT: Head is normocephalic. Extraocular movements are intact. Oropharynx is clear. Neck: Neck is supple, no palpable cervical or supraclavicular lymphadenopathy. Heart: Regular in rate and rhythm with no murmurs, rubs, or gallops. Chest: Clear to auscultation bilaterally, with no rhonchi, wheezes, or rales. Abdomen: Soft, nontender, nondistended, with no rigidity or guarding. Extremities: No cyanosis or edema. Lymphatics: see Neck Exam Skin: No concerning lesions. Musculoskeletal: symmetric strength and muscle tone throughout. Neurologic: Cranial nerves II through XII are grossly intact. No obvious focalities. Speech is fluent. Coordination is intact. Psychiatric: Judgment and insight are intact. Affect is appropriate. Breasts: *** . No other palpable masses appreciated in the breasts or axillae *** .    ECOG = ***  0 - Asymptomatic (Fully active, able to carry on all predisease activities without restriction)  1 - Symptomatic but completely ambulatory (Restricted in physically strenuous activity but ambulatory and able to carry out work of a light or sedentary nature. For example, light housework, office work)  2 - Symptomatic, <50% in bed during the day (Ambulatory and capable of all self care but unable to carry out any work activities. Up and about more than 50% of waking hours)  3 - Symptomatic, >50% in bed, but not bedbound (Capable of only limited self-care, confined to bed or chair 50%  or more of waking hours)  4 - Bedbound (Completely disabled. Cannot carry on any self-care. Totally confined to bed or chair)  5 - Death   Eustace Pen MM, Creech RH, Tormey DC, et al. [phone]). "Toxicity and response criteria of the [facility]". [place] Oncol. 5 (6): 649-55   LABORATORY DATA:  Lab Results  Component Value Date   WBC 7.1 09/16/2020   HGB 12.5 09/16/2020   HCT 37.7 09/16/2020   MCV 85.3 09/16/2020   PLT 234 09/16/2020   CMP     Component Value Date/Time   NA 136 09/16/2020 0623   K 3.5 09/16/2020 0623   CL 102 09/16/2020 0623   CO2 24 09/16/2020 0623   GLUCOSE 110 (H) 09/16/2020 0623   BUN 8 09/16/2020 0623   CREATININE 0.83 09/16/2020 0623   CALCIUM 8.6 (L) 09/16/2020 0623   PROT 6.0 (L) 09/16/2020 0623   ALBUMIN 3.7 09/16/2020 0623   AST 24 09/16/2020 0623   ALT 20 09/16/2020 0623   ALKPHOS 62 09/16/2020 0623   BILITOT 0.4 09/16/2020 0623   GFRNONAA >60  09/16/2020 0623   GFRAA  01/13/2009 1007    >60        The eGFR has been calculated using the MDRD equation. This calculation has not been validated in all clinical situations. eGFR's persistently <60 mL/min signify possible Chronic Kidney Disease.         RADIOGRAPHY: US BREAST LTD UNI LEFT INC AXILLA  Addendum Date: 02/13/2022   ADDENDUM REPORT: 02/13/2022 10:51 ADDENDUM: LEFT axilla was evaluated with ultrasound showing no enlarged or morphologically abnormal lymph nodes. Electronically Signed   By: Franki Cabot M.D.   On: 02/13/2022 10:51   Result Date: 02/13/2022 CLINICAL DATA:  Multiple palpable lumps within the LEFT breast. History of RIGHT breast stereotactic biopsy in 2015 with pathology result of fibroadenoma and fibrocystic change. EXAM: DIGITAL DIAGNOSTIC UNILATERAL LEFT MAMMOGRAM WITH IMPLANTS, CAD AND TOMOSYNTHESIS; ULTRASOUND LEFT BREAST LIMITED TECHNIQUE: Left digital diagnostic mammography and breast tomosynthesis was performed. The images were evaluated with  computer-aided detection. Standard and/or implant displaced views were performed.; Targeted ultrasound examination of the left breast was performed. COMPARISON:  Previous exam(s). ACR Breast Density Category c: The breast tissue is heterogeneously dense, which may obscure small masses. FINDINGS: There is a partially obscured mass within the slightly upper slightly outer LEFT breast, with associated calcifications, corresponding to 1 of the palpable areas of concern with overlying skin marker in place. Additional grouped punctate and round calcifications are seen within the inner LEFT breast, measuring 5 mm extent. The patient has retropectoral implants. Targeted ultrasound is performed, showing an irregular hypoechoic mass in the LEFT breast at the 1 o'clock axis, 1 cm from the nipple, measuring 1.8 x 0.5 x 0.8 cm, with associated calcifications, with internal vascularity, corresponding to 1 of the palpable areas of concern and the partially obscured mass seen on today's mammogram in the outer LEFT breast. There is a benign simple cyst in the LEFT breast at the 4 o'clock axis, 2 cm from the nipple, measuring 2 x 0.5 x 1.5 cm. Lastly, there is an irregular hypoechoic area in the LEFT breast at the 9 o'clock axis, 2 cm from the nipple, measuring 1.7 x 0.5 x 1.6 cm, containing subtle echogenic foci that may correspond to the calcifications seen on mammogram. IMPRESSION: 1. Suspicious irregular hypoechoic mass in the LEFT breast at the 1 o'clock axis, 1 cm from the nipple, measuring 1.8 cm, with associated calcifications, corresponding to 1 of the palpable areas of concern. This may represent a fibroadenoma. Ultrasound-guided biopsy is recommended to exclude malignancy. 2. Additional irregular hypoechoic area in the LEFT breast at the 9 o'clock axis, 2 cm from the nipple, measuring 1.7 cm, corresponding to a second palpable area of concern. Ultrasound-guided biopsy is recommended to exclude malignancy. 3. Linear grouped  calcifications within the inner LEFT breast. These may be related to the hypoechoic area seen in the LEFT breast at the 9 o'clock axis for which ultrasound-guided biopsy is recommended above. RECOMMENDATION: 1. Ultrasound-guided biopsy for the irregular hypoechoic mass in the LEFT breast at the 1 o'clock axis. 2. Ultrasound-guided biopsy for the irregular hypoechoic area in the LEFT breast at the 9 o'clock axis. 3. Postprocedure mammogram to ensure biopsy clip within the inner LEFT breast also involves the site of the grouped punctate and round calcifications. If not, recommend magnification views of these LEFT breast calcifications (inadvertently not obtained today) with additional follow-up/biopsy possible based on these magnification views. If the calcifications were deemed suspicious on magnification views, would recommend stereotactic biopsy be scheduled for  a different day. Ultrasound-guided biopsies are scheduled on May 12th. I have discussed the findings and recommendations with the patient. If applicable, a reminder letter will be sent to the patient regarding the next appointment. BI-RADS CATEGORY  4: Suspicious. Electronically Signed: By: Franki Cabot M.D. On: 02/04/2022 11:03  MM DIAG BREAST W/IMPLANT TOMO UNI L  Addendum Date: 02/13/2022   ADDENDUM REPORT: 02/13/2022 10:51 ADDENDUM: LEFT axilla was evaluated with ultrasound showing no enlarged or morphologically abnormal lymph nodes. Electronically Signed   By: Franki Cabot M.D.   On: 02/13/2022 10:51   Result Date: 02/13/2022 CLINICAL DATA:  Multiple palpable lumps within the LEFT breast. History of RIGHT breast stereotactic biopsy in 2015 with pathology result of fibroadenoma and fibrocystic change. EXAM: DIGITAL DIAGNOSTIC UNILATERAL LEFT MAMMOGRAM WITH IMPLANTS, CAD AND TOMOSYNTHESIS; ULTRASOUND LEFT BREAST LIMITED TECHNIQUE: Left digital diagnostic mammography and breast tomosynthesis was performed. The images were evaluated with  computer-aided detection. Standard and/or implant displaced views were performed.; Targeted ultrasound examination of the left breast was performed. COMPARISON:  Previous exam(s). ACR Breast Density Category c: The breast tissue is heterogeneously dense, which may obscure small masses. FINDINGS: There is a partially obscured mass within the slightly upper slightly outer LEFT breast, with associated calcifications, corresponding to 1 of the palpable areas of concern with overlying skin marker in place. Additional grouped punctate and round calcifications are seen within the inner LEFT breast, measuring 5 mm extent. The patient has retropectoral implants. Targeted ultrasound is performed, showing an irregular hypoechoic mass in the LEFT breast at the 1 o'clock axis, 1 cm from the nipple, measuring 1.8 x 0.5 x 0.8 cm, with associated calcifications, with internal vascularity, corresponding to 1 of the palpable areas of concern and the partially obscured mass seen on today's mammogram in the outer LEFT breast. There is a benign simple cyst in the LEFT breast at the 4 o'clock axis, 2 cm from the nipple, measuring 2 x 0.5 x 1.5 cm. Lastly, there is an irregular hypoechoic area in the LEFT breast at the 9 o'clock axis, 2 cm from the nipple, measuring 1.7 x 0.5 x 1.6 cm, containing subtle echogenic foci that may correspond to the calcifications seen on mammogram. IMPRESSION: 1. Suspicious irregular hypoechoic mass in the LEFT breast at the 1 o'clock axis, 1 cm from the nipple, measuring 1.8 cm, with associated calcifications, corresponding to 1 of the palpable areas of concern. This may represent a fibroadenoma. Ultrasound-guided biopsy is recommended to exclude malignancy. 2. Additional irregular hypoechoic area in the LEFT breast at the 9 o'clock axis, 2 cm from the nipple, measuring 1.7 cm, corresponding to a second palpable area of concern. Ultrasound-guided biopsy is recommended to exclude malignancy. 3. Linear grouped  calcifications within the inner LEFT breast. These may be related to the hypoechoic area seen in the LEFT breast at the 9 o'clock axis for which ultrasound-guided biopsy is recommended above. RECOMMENDATION: 1. Ultrasound-guided biopsy for the irregular hypoechoic mass in the LEFT breast at the 1 o'clock axis. 2. Ultrasound-guided biopsy for the irregular hypoechoic area in the LEFT breast at the 9 o'clock axis. 3. Postprocedure mammogram to ensure biopsy clip within the inner LEFT breast also involves the site of the grouped punctate and round calcifications. If not, recommend magnification views of these LEFT breast calcifications (inadvertently not obtained today) with additional follow-up/biopsy possible based on these magnification views. If the calcifications were deemed suspicious on magnification views, would recommend stereotactic biopsy be scheduled for a different day. Ultrasound-guided biopsies  are scheduled on May 12th. I have discussed the findings and recommendations with the patient. If applicable, a reminder letter will be sent to the patient regarding the next appointment. BI-RADS CATEGORY  4: Suspicious. Electronically Signed: By: Franki Cabot M.D. On: 02/04/2022 11:03  MM CLIP PLACEMENT LEFT  Addendum Date: 02/21/2022   ADDENDUM REPORT: 02/21/2022 11:16 ADDENDUM: Consider stereotactic guided core biopsy of 5 millimeter group of calcifications in the MEDIAL aspect of the LEFT breast if breast conservation is being considered. A second group of calcifications is 0.7 centimeters anterior to the ribbon shaped clip and can possibly be excised at the time of localization. Additionally, recommend RIGHT diagnostic mammogram as the last bilateral mammogram was performed in August of 2022. Electronically Signed   By: Nolon Nations M.D.   On: 02/21/2022 11:16   Result Date: 02/21/2022 CLINICAL DATA:  Assess post biopsy marker clip placement following ultrasound-guided core needle biopsy of 2 left  breast masses. EXAM: 3D DIAGNOSTIC LEFT MAMMOGRAM POST ULTRASOUND BIOPSY COMPARISON:  Previous exam(s). FINDINGS: 3D Mammographic images were obtained following ultrasound guided biopsy of the left breast. The ribbon shaped post biopsy marker clip lies within the expected location of the 1 o'clock position mass, adjacent to a small group of punctate and small round calcifications. The coil shaped biopsy clip lies directly adjacent to the implant, in the medial breast, deep, but in close proximity, to the small linear group of calcifications described on the diagnostic report, in the expected location of the 9 o'clock position mass. IMPRESSION: Appropriate positioning of both the ribbon and coil shaped post biopsy marker clips in the left breast as detailed above. Final Assessment: Post Procedure Mammograms for Marker Placement Electronically Signed: By: Lajean Manes M.D. On: 02/10/2022 08:42   [place] LT BREAST BX W LOC DEV 1ST LESION IMG BX SPEC US GUIDE  Addendum Date: 02/21/2022   ADDENDUM REPORT: 02/21/2022 11:18 ADDENDUM: Consider stereotactic guided core biopsy of 5 millimeter group of calcifications in the MEDIAL aspect of the LEFT breast if breast conservation is being considered. A second group of calcifications is 0.7 centimeters anterior to the ribbon shaped clip and can possibly be excised at the time of localization. Additionally, recommend RIGHT diagnostic mammogram as the last bilateral mammogram was performed in August of 2022. Electronically Signed   By: Nolon Nations M.D.   On: 02/21/2022 11:18   Addendum Date: 02/15/2022   ADDENDUM REPORT: 02/15/2022 08:33 ADDENDUM: Pathology revealed GRADE II INVASIVE PLEOMORPHIC LOBULAR CARCINOMA, PLEOMORPHIC LOBULAR CARCINOMA IN SITU, CALCIFICATIONS of the LEFT breast, 1 o'clock, 1 cmfn, (ribbon clip). This was found to be concordant by Dr. Lajean Manes. Pathology revealed GRADE II INVASIVE PLEOMORPHIC LOBULAR CARCINOMA, PLEOMORPHIC LOBULAR CARCINOMA IN  SITU, CALCIFICATIONS of the LEFT breast, 9 o'clock, 2 cmfn, (coil clip). This was found to be concordant by Dr. Lajean Manes. Pathology results were discussed with the patient by telephone. The patient reported doing well after the biopsies with tenderness at the sites. Post biopsy instructions and care were reviewed and questions were answered. The patient was encouraged to call [facility] for any additional concerns. My direct phone number was provided. The patient was referred to [facility] on Feb 22, 2022. NOTE: The patient may need additional Left stereotactic guided biopsy of calcifications if breast conservation is pursued, after the MRI has been performed. Recommendation for a bilateral breast MRI given the lobular histology. Pathology results reported  by Terie Purser, RN on 02/15/2022. Electronically Signed   By: Lajean Manes M.D.   On: 02/15/2022 08:33   Result Date: 02/21/2022 CLINICAL DATA:  Patient presents for ultrasound-guided core needle biopsy of 2 irregular hypoechoic masses in the left breast. EXAM: ULTRASOUND GUIDED LEFT BREAST CORE NEEDLE BIOPSY: 2 CORE NEEDLE BIOPSIES PERFORMED. COMPARISON:  Prior exams PROCEDURE: I met with the patient and we discussed the procedure of ultrasound-guided biopsy, including benefits and alternatives. We discussed the high likelihood of a successful procedure. We discussed the risks of the procedure, including infection, bleeding, tissue injury, clip migration, and inadequate sampling. Informed written consent was given. The usual time-out protocol was performed immediately prior to the procedure. Biopsy #1: 1.8 cm mass, 1 o'clock, 1 cm from the nipple. Lesion quadrant: Upper outer quadrant Using sterile technique and 1% Lidocaine as local anesthetic, under direct ultrasound visualization, a 14 gauge spring-loaded device was used to perform biopsy of the  irregular hypoechoic mass at 1 o'clock using a inferolateral approach. At the conclusion of the procedure a ribbon shaped tissue marker clip was deployed into the biopsy cavity. Biopsy #1: 1.7 cm mass, 9 o'clock, 2 cm the nipple. Lesion quadrant: Upper inner quadrant Using sterile technique and 1% Lidocaine as local anesthetic, under direct ultrasound visualization, a 14 gauge spring-loaded device was used to perform biopsy of irregular hypoechoic mass/lesion at 9 o'clock using a inferomedial approach. At the conclusion of the procedure a coil shaped tissue marker clip was deployed into the biopsy cavity. Follow up 2 view mammogram was performed and dictated separately. IMPRESSION: Ultrasound guided biopsy of 2 left breast masses. No apparent complications. Electronically Signed: By: Lajean Manes M.D. On: 02/10/2022 08:34   [place] LT BREAST BX W LOC DEV EA ADD LESION IMG BX SPEC US GUIDE  Addendum Date: 02/21/2022   ADDENDUM REPORT: 02/21/2022 11:18 ADDENDUM: Consider stereotactic guided core biopsy of 5 millimeter group of calcifications in the MEDIAL aspect of the LEFT breast if breast conservation is being considered. A second group of calcifications is 0.7 centimeters anterior to the ribbon shaped clip and can possibly be excised at the time of localization. Additionally, recommend RIGHT diagnostic mammogram as the last bilateral mammogram was performed in August of 2022. Electronically Signed   By: Nolon Nations M.D.   On: 02/21/2022 11:18   Addendum Date: 02/15/2022   ADDENDUM REPORT: 02/15/2022 08:33 ADDENDUM: Pathology revealed GRADE II INVASIVE PLEOMORPHIC LOBULAR CARCINOMA, PLEOMORPHIC LOBULAR CARCINOMA IN SITU, CALCIFICATIONS of the LEFT breast, 1 o'clock, 1 cmfn, (ribbon clip). This was found to be concordant by Dr. Lajean Manes. Pathology revealed GRADE II INVASIVE PLEOMORPHIC LOBULAR CARCINOMA, PLEOMORPHIC LOBULAR CARCINOMA IN SITU, CALCIFICATIONS of the LEFT breast, 9 o'clock, 2 cmfn, (coil  clip). This was found to be concordant by Dr. Lajean Manes. Pathology results were discussed with the patient by telephone. The patient reported doing well after the biopsies with tenderness at the sites. Post biopsy instructions and care were reviewed and questions were answered. The patient was encouraged to call [facility] for any additional concerns. My direct phone number was provided. The patient was referred to [facility] on Feb 22, 2022. NOTE: The patient may need additional Left stereotactic guided biopsy of calcifications if breast conservation is pursued, after the MRI has been performed. Recommendation for a bilateral breast MRI given the lobular histology. Pathology results reported by Terie Purser, RN on 02/15/2022. Electronically Signed  By: Lajean Manes M.D.   On: 02/15/2022 08:33   Result Date: 02/21/2022 CLINICAL DATA:  Patient presents for ultrasound-guided core needle biopsy of 2 irregular hypoechoic masses in the left breast. EXAM: ULTRASOUND GUIDED LEFT BREAST CORE NEEDLE BIOPSY: 2 CORE NEEDLE BIOPSIES PERFORMED. COMPARISON:  Prior exams PROCEDURE: I met with the patient and we discussed the procedure of ultrasound-guided biopsy, including benefits and alternatives. We discussed the high likelihood of a successful procedure. We discussed the risks of the procedure, including infection, bleeding, tissue injury, clip migration, and inadequate sampling. Informed written consent was given. The usual time-out protocol was performed immediately prior to the procedure. Biopsy #1: 1.8 cm mass, 1 o'clock, 1 cm from the nipple. Lesion quadrant: Upper outer quadrant Using sterile technique and 1% Lidocaine as local anesthetic, under direct ultrasound visualization, a 14 gauge spring-loaded device was used to perform biopsy of the irregular hypoechoic mass at 1 o'clock using a inferolateral  approach. At the conclusion of the procedure a ribbon shaped tissue marker clip was deployed into the biopsy cavity. Biopsy #1: 1.7 cm mass, 9 o'clock, 2 cm the nipple. Lesion quadrant: Upper inner quadrant Using sterile technique and 1% Lidocaine as local anesthetic, under direct ultrasound visualization, a 14 gauge spring-loaded device was used to perform biopsy of irregular hypoechoic mass/lesion at 9 o'clock using a inferomedial approach. At the conclusion of the procedure a coil shaped tissue marker clip was deployed into the biopsy cavity. Follow up 2 view mammogram was performed and dictated separately. IMPRESSION: Ultrasound guided biopsy of 2 left breast masses. No apparent complications. Electronically Signed: By: Lajean Manes M.D. On: 02/10/2022 08:34      IMPRESSION/PLAN: ***   It was a pleasure meeting the patient today. We discussed the risks, benefits, and side effects of radiotherapy. I recommend radiotherapy to the *** to reduce her risk of locoregional recurrence by 2/3.  We discussed that radiation would take approximately *** weeks to complete and that I would give the patient a few weeks to heal following surgery before starting treatment planning. *** If chemotherapy were to be given, this would precede radiotherapy. We spoke about acute effects including skin irritation and fatigue as well as much less common late effects including internal organ injury or irritation. We spoke about the latest technology that is used to minimize the risk of late effects for patients undergoing radiotherapy to the breast or chest wall. No guarantees of treatment were given. The patient is enthusiastic about proceeding with treatment. I look forward to participating in the patient's care.  I will await her referral back to me for postoperative follow-up and eventual CT simulation/treatment planning.  On date of service, in total, I spent *** minutes on this encounter. Patient was seen in person.    __________________________________________   Eppie Gibson, MD  This document serves as a record of services personally performed by Eppie Gibson, MD. It was created on her behalf by Roney Mans, a trained medical scribe. The creation of this record is based on the scribe's personal observations and the provider's statements to them. This document has been checked and approved by the attending provider.

## 2022-02-22 ENCOUNTER — Encounter: Payer: Self-pay | Admitting: Radiation Oncology

## 2022-02-22 ENCOUNTER — Encounter: Payer: Self-pay | Admitting: Physical Therapy

## 2022-02-22 ENCOUNTER — Inpatient Hospital Stay (HOSPITAL_BASED_OUTPATIENT_CLINIC_OR_DEPARTMENT_OTHER): Payer: Medicare Other | Admitting: Genetic Counselor

## 2022-02-22 ENCOUNTER — Other Ambulatory Visit: Payer: Self-pay

## 2022-02-22 ENCOUNTER — Inpatient Hospital Stay: Payer: Medicare Other | Admitting: Licensed Clinical Social Worker

## 2022-02-22 ENCOUNTER — Ambulatory Visit: Payer: Medicare Other | Attending: Surgery | Admitting: Physical Therapy

## 2022-02-22 ENCOUNTER — Encounter: Payer: Self-pay | Admitting: *Deleted

## 2022-02-22 ENCOUNTER — Inpatient Hospital Stay: Payer: Medicare Other | Attending: Hematology and Oncology

## 2022-02-22 ENCOUNTER — Encounter: Payer: Medicare Other | Admitting: Psychology

## 2022-02-22 ENCOUNTER — Inpatient Hospital Stay (HOSPITAL_BASED_OUTPATIENT_CLINIC_OR_DEPARTMENT_OTHER): Payer: Medicare Other | Admitting: Hematology and Oncology

## 2022-02-22 ENCOUNTER — Ambulatory Visit
Admission: RE | Admit: 2022-02-22 | Discharge: 2022-02-22 | Disposition: A | Discharge status: Home / Self Care | Payer: Medicare Other | Source: Ambulatory Visit | Attending: Radiation Oncology | Admitting: Radiation Oncology

## 2022-02-22 DIAGNOSIS — C50412 Malignant neoplasm of upper-outer quadrant of left female breast: Secondary | ICD-10-CM

## 2022-02-22 DIAGNOSIS — Z803 Family history of malignant neoplasm of breast: Secondary | ICD-10-CM | POA: Diagnosis not present

## 2022-02-22 DIAGNOSIS — Z171 Estrogen receptor negative status [ER-]: Secondary | ICD-10-CM

## 2022-02-22 DIAGNOSIS — C50212 Malignant neoplasm of upper-inner quadrant of left female breast: Secondary | ICD-10-CM

## 2022-02-22 DIAGNOSIS — R293 Abnormal posture: Secondary | ICD-10-CM | POA: Insufficient documentation

## 2022-02-22 DIAGNOSIS — Z853 Personal history of malignant neoplasm of breast: Secondary | ICD-10-CM | POA: Diagnosis not present

## 2022-02-22 DIAGNOSIS — C50912 Malignant neoplasm of unspecified site of left female breast: Secondary | ICD-10-CM | POA: Diagnosis not present

## 2022-02-22 DIAGNOSIS — Z8042 Family history of malignant neoplasm of prostate: Secondary | ICD-10-CM | POA: Diagnosis not present

## 2022-02-22 DIAGNOSIS — I10 Essential (primary) hypertension: Secondary | ICD-10-CM | POA: Diagnosis present

## 2022-02-22 LAB — CBC WITH DIFFERENTIAL (CANCER CENTER ONLY)
Abs Immature Granulocytes: 0.02 10*3/uL (ref 0.00–0.07)
Basophils Absolute: 0.1 10*3/uL (ref 0.0–0.1)
Basophils Relative: 1 %
Eosinophils Absolute: 0.2 10*3/uL (ref 0.0–0.5)
Eosinophils Relative: 3 %
HCT: 35.4 % — ABNORMAL LOW (ref 36.0–46.0)
Hemoglobin: 12 g/dL (ref 12.0–15.0)
Immature Granulocytes: 0 %
Lymphocytes Relative: 23 %
Lymphs Abs: 1.6 10*3/uL (ref 0.7–4.0)
MCH: 28.9 pg (ref 26.0–34.0)
MCHC: 33.9 g/dL (ref 30.0–36.0)
MCV: 85.3 fL (ref 80.0–100.0)
Monocytes Absolute: 0.8 10*3/uL (ref 0.1–1.0)
Monocytes Relative: 12 %
Neutro Abs: 4.2 10*3/uL (ref 1.7–7.7)
Neutrophils Relative %: 61 %
Platelet Count: 258 10*3/uL (ref 150–400)
RBC: 4.15 MIL/uL (ref 3.87–5.11)
RDW: 12.8 % (ref 11.5–15.5)
WBC Count: 7 10*3/uL (ref 4.0–10.5)
nRBC: 0 % (ref 0.0–0.2)

## 2022-02-22 LAB — CMP (CANCER CENTER ONLY)
ALT: 12 U/L (ref 0–44)
AST: 17 U/L (ref 15–41)
Albumin: 4.2 g/dL (ref 3.5–5.0)
Alkaline Phosphatase: 91 U/L (ref 38–126)
Anion gap: 6 (ref 5–15)
BUN: 14 mg/dL (ref 8–23)
CO2: 27 mmol/L (ref 22–32)
Calcium: 9.2 mg/dL (ref 8.9–10.3)
Chloride: 103 mmol/L (ref 98–111)
Creatinine: 1.12 mg/dL — ABNORMAL HIGH (ref 0.44–1.00)
GFR, Estimated: 51 mL/min — ABNORMAL LOW (ref >60)
Glucose, Bld: 89 mg/dL (ref 70–99)
Potassium: 3.7 mmol/L (ref 3.5–5.1)
Sodium: 136 mmol/L (ref 135–145)
Total Bilirubin: 0.5 mg/dL (ref 0.3–1.2)
Total Protein: 6.7 g/dL (ref 6.5–8.1)

## 2022-02-22 LAB — GENETIC SCREENING ORDER

## 2022-02-22 NOTE — Therapy (Signed)
OUTPATIENT PHYSICAL THERAPY BREAST CANCER BASELINE EVALUATION   Patient Name: Allison Pierce MRN: [id number] DOB:[birth year], 76 y.o., female Today's Date: 02/22/2022   PT End of Session - 02/22/22 1321     Visit Number 1    Number of Visits 2    Date for PT Re-Evaluation 04/19/22    PT Start Time 0928    PT Stop Time 0942   Also saw pt from 1042-1100 for a total of 32 min   PT Time Calculation (min) 14 min    Activity Tolerance Patient tolerated treatment well    Behavior During Therapy [facility] for tasks assessed/performed             Past Medical History:  Diagnosis Date   Anxiety    Depression    GERD (gastroesophageal reflux disease) OCCASIONALLY  TAKE TUMS   Hyperlipidemia    Hypertension    Vulvar lesion    Past Surgical History:  Procedure Laterality Date   ABDOMINAL HYSTERECTOMY  1992   W/ SALPINGO-OOPHORECTOMY   ANTERIOR CERVICAL DECOMP/DISCECTOMY FUSION  01-18-2009  DR POOLE   C4  - C6   AUGMENTATION MAMMAPLASTY Bilateral    BREAST BIOPSY Right    BREAST ENHANCEMENT SURGERY  2011   BREAST EXCISIONAL BIOPSY Left    BREAST SURGERY  02-04-2011  dr Margot Chimes   EXCISION LEFT BREAST MASS--  [place] REMOVAL  09/10/2012   Procedure: VULVAR LESION;  Surgeon: Selinda Orion, MD;  Location: [place];  Service: Gynecology;  Laterality: N/A;  WIDE EXCISION OF VULVAR LESION   Patient Active Problem List   Diagnosis Date Noted   Malignant neoplasm of upper-outer quadrant of left breast in female, estrogen receptor negative ([place]) 02/20/2022   Malignant neoplasm of upper-inner quadrant of left female breast ([place]) 02/20/2022   Right-sided epistaxis 07/19/2017   Anxiety state 01/24/2013   Arthropathy 01/24/2013   Depressive disorder, not elsewhere classified 01/24/2013   Other and unspecified hyperlipidemia 01/24/2013    PCP: Dr. [name]  REFERRING PROVIDER: Dr. Coralie Keens  REFERRING DIAG: Left breast  cancer  THERAPY DIAG:  Malignant neoplasm of upper-outer quadrant of left breast in female, estrogen receptor negative ([place])  Abnormal posture  Rationale for Evaluation and Treatment Rehabilitation  ONSET DATE: 02/04/2022  SUBJECTIVE                                                                                                                                                                                           SUBJECTIVE STATEMENT: Patient reports she is here today to be seen by her medical team for her newly  diagnosed left breast cancer.   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 It measures 1.8 cm and is located in the upper outer quadrant. It is ER/PR negative and HER2 positive with a Ki67 of 20%. She has dementia and hx of breast augmentation in 2011.  PATIENT GOALS   reduce lymphedema risk and learn post op HEP.   PAIN:  Are you having pain? No   PRECAUTIONS: Active CA   HAND DOMINANCE: right  WEIGHT BEARING RESTRICTIONS No  FALLS:  Has patient fallen in last 6 months? No  LIVING ENVIRONMENT: Patient lives with: her husband Lives in: House/apartment Has following equipment at home: None  OCCUPATION: Retired  LEISURE: She does not exercise  PRIOR LEVEL OF FUNCTION: Independent   OBJECTIVE  COGNITION:  Overall cognitive status: Impaired due to memory deficits    POSTURE:  Forward head and rounded shoulders posture  UPPER EXTREMITY AROM/PROM:  A/PROM RIGHT   eval   Shoulder extension 54  Shoulder flexion 161  Shoulder abduction 168  Shoulder internal rotation 68  Shoulder external rotation 77    (Blank rows = not tested)  A/PROM LEFT   eval  Shoulder extension 49  Shoulder flexion 151  Shoulder abduction 176  Shoulder internal rotation 73  Shoulder external rotation 78    (Blank rows = not tested)   CERVICAL AROM: All within normal limits:    Percent limited   Flexion WNL  Extension 50% limited  Right lateral flexion 50% limited  Left lateral flexion 50% limited  Right rotation 50% limited  Left rotation 50% limited     UPPER EXTREMITY STRENGTH: WFL   LYMPHEDEMA ASSESSMENTS:   LANDMARK RIGHT   eval  10 cm proximal to olecranon process 26.5  Olecranon process 24.5  10 cm proximal to ulnar styloid process 21.1  Just proximal to ulnar styloid process 15.1  Across hand at thumb web space 17.8  At base of 2nd digit 5.9  (Blank rows = not tested)  LANDMARK LEFT   eval  10 cm proximal to olecranon process 25.3  Olecranon process 23.1  10 cm proximal to ulnar styloid process 20  Just proximal to ulnar styloid process 14.8  Across hand at thumb web space 17.8  At base of 2nd digit 5.8  (Blank rows = not tested)   L-DEX LYMPHEDEMA SCREENING:  The patient was assessed using the L-Dex machine today to produce a lymphedema index baseline score. The patient will be reassessed on a regular basis (typically every 3 months) to obtain new L-Dex scores. If the score is > 6.5 points away from his/her baseline score indicating onset of subclinical lymphedema, it will be recommended to wear a compression garment for 4 weeks, 12 hours per day and then be reassessed. If the score continues to be > 6.5 points from baseline at reassessment, we will initiate lymphedema treatment. Assessing in this manner has a 95% rate of preventing clinically significant lymphedema.   L-DEX FLOWSHEETS - 02/22/22 1300       L-DEX LYMPHEDEMA SCREENING   Measurement Type Unilateral    L-DEX MEASUREMENT EXTREMITY Upper Extremity    POSITION  Standing    DOMINANT SIDE Left    At Risk Side Right    BASELINE SCORE (UNILATERAL) -1.1              QUICK DASH SURVEY:   PATIENT EDUCATION:  Education details: Lymphedema risk reduction and post op shoulder/posture  HEP Person educated: Patient Education method: Explanation, Demonstration, Handout Education  comprehension: Patient verbalized understanding and returned demonstration   HOME EXERCISE PROGRAM: Patient was instructed today in a home exercise program today for post op shoulder range of motion. These included active assist shoulder flexion in sitting, scapular retraction, wall walking with shoulder abduction, and hands behind head external rotation.  She was encouraged to do these twice a day, holding 3 seconds and repeating 5 times when permitted by her physician.   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 It measures 1.8 cm and is located in the upper outer quadrant. It is ER/PR negative and HER2 positive with a Ki67 of 20%. She has dementia and hx of breast augmentation in 2011.Her multidisciplinary medical team met prior to her assessments to determine a recommended treatment plan. She is planning to have a left mastectomy and sentinel node biopsy followed by chemotherapy. She will benefit from a post op PT reassessment to determine needs and from L-Dex screens every 3 months for 2 years to detect subclinical lymphedema.  Pt will benefit from skilled therapeutic intervention to improve on the following deficits: Decreased knowledge of precautions, impaired UE functional use, pain, decreased ROM, postural dysfunction.   PT treatment/interventions: ADL/self-care home management, pt/family education, therapeutic exercise  REHAB POTENTIAL: Excellent  CLINICAL DECISION MAKING: Stable/uncomplicated  EVALUATION COMPLEXITY: Low   GOALS: Goals reviewed with patient? YES  LONG TERM GOALS: (STG=LTG)    Name Target Date Goal status  1 Pt will be able to verbalize understanding of pertinent lymphedema risk reduction practices relevant to her dx specifically related to skin care.  Baseline:  No knowledge 02/22/2022 Achieved at eval  2 Pt will be able to return demo and/or verbalize understanding of the  post op HEP related to regaining shoulder ROM. Baseline:  No knowledge 02/22/2022 Achieved at eval  3 Pt will be able to verbalize understanding of the importance of attending the post op After Breast CA Class for further lymphedema risk reduction education and therapeutic exercise.  Baseline:  No knowledge 02/22/2022 Achieved at eval  4 Pt will demo she has regained full shoulder ROM and function post operatively compared to baselines.  Baseline: See objective measurements taken today. 04/19/2022      PLAN: PT FREQUENCY/DURATION: EVAL and 1 follow up appointment.   PLAN FOR NEXT SESSION: will reassess 3-4 weeks post op to determine needs.   Patient will follow up at outpatient cancer rehab 3-4 weeks following surgery.  If the patient requires physical therapy at that time, a specific plan will be dictated and sent to the referring physician for approval. The patient was educated today on appropriate basic range of motion exercises to begin post operatively and the importance of attending the After Breast Cancer class following surgery.  Patient was educated today on lymphedema risk reduction practices as it pertains to recommendations that will benefit the patient immediately following surgery.  She verbalized good understanding.    Physical Therapy Information for After Breast Cancer Surgery/Treatment:  Lymphedema is a swelling condition that you may be at risk for in your arm if you have lymph nodes removed from the armpit area.  After a sentinel node biopsy, the risk is approximately 5-9% and is higher after an axillary node dissection.  There is treatment available for this condition and it is not life-threatening.  Contact your physician or physical therapist with concerns. You may begin the 4 shoulder/posture  exercises (see additional sheet) when permitted by your physician (typically a week after surgery).  If you have drains, you may need to wait until those are removed before beginning  range of motion exercises.  A general recommendation is to not lift your arms above shoulder height until drains are removed.  These exercises should be done to your tolerance and gently.  This is not a "no pain/no gain" type of recovery so listen to your body and stretch into the range of motion that you can tolerate, stopping if you have pain.  If you are having immediate reconstruction, ask your plastic surgeon about doing exercises as he or she may want you to wait. We encourage you to attend the free one time ABC (After Breast Cancer) class offered by [facility].  You will learn information related to lymphedema risk, prevention and treatment and additional exercises to regain mobility following surgery.  You can call [phone] for more information.  This is offered the 1st and 3rd Monday of each month.  You only attend the class one time. While undergoing any medical procedure or treatment, try to avoid blood pressure being taken or needle sticks from occurring on the arm on the side of cancer.   This recommendation begins after surgery and continues for the rest of your life.  This may help reduce your risk of getting lymphedema (swelling in your arm). An excellent resource for those seeking information on lymphedema is the National Lymphedema Network's web site. It can be accessed at [place].org If you notice swelling in your hand, arm or breast at any time following surgery (even if it is many years from now), please contact your doctor or physical therapist to discuss this.  Lymphedema can be treated at any time but it is easier for you if it is treated early on.  If you feel like your shoulder motion is not returning to normal in a reasonable amount of time, please contact your surgeon or physical therapist.  [facility] [phone]. [address], [address], [place] [address]  ABC CLASS After Breast Cancer Class  After  Breast Cancer Class is a specially designed exercise class to assist you in a safe recover after having breast cancer surgery.  In this class you will learn how to get back to full function whether your drains were just removed or if you had surgery a month ago.  This one-time class is held the 1st and 3rd Monday of every month from 11:00 a.m. until 12:00 noon virtually.  This class is FREE and space is limited. For more information or to register for the next available class, call [phone].  Class Goals  Understand specific stretches to improve the flexibility of you chest and shoulder. Learn ways to safely strengthen your upper body and improve your posture. Understand the warning signs of infection and why you may be at risk for an arm infection. Learn about Lymphedema and prevention.  ** You do not attend this class until after surgery.  Drains must be removed to participate  Patient was instructed today in a home exercise program today for post op shoulder range of motion. These included active assist shoulder flexion in sitting, scapular retraction, wall walking with shoulder abduction, and hands behind head external rotation.  She was encouraged to do these twice a day, holding 3 seconds and repeating 5 times when permitted by her physician.  Allison Pierce, [place] 02/22/22 1:57 PM

## 2022-02-22 NOTE — Progress Notes (Signed)
[place] Work  Initial Assessment   Allison Pierce is a 76 y.o. year old female accompanied by spouse, Allison Pierce. Clinical Social Work was referred by  [facility]  for assessment of psychosocial needs.   SDOH (Social Determinants of Health) assessments performed: Yes SDOH Interventions    Flowsheet Row Most Recent Value  SDOH Interventions   Food Insecurity Interventions Intervention Not Indicated  Financial Strain Interventions Intervention Not Indicated  Housing Interventions Intervention Not Indicated  Transportation Interventions Intervention Not Indicated       SDOH Screenings   Alcohol Screen: Not on file  Depression (PHQ2-9): Not on file  Financial Resource Strain: Low Risk    Difficulty of Paying Living Expenses: Not hard at all  Food Insecurity: No Food Insecurity   Worried About Charity fundraiser in the Last Year: Never true   Arboriculturist in the Last Year: Never true  Housing: Low Risk    Last Housing Risk Score: 0  Physical Activity: Not on file  Social Connections: Not on file  Stress: Not on file  Tobacco Use: Low Risk    Smoking Tobacco Use: Never   Smokeless Tobacco Use: Never   Passive Exposure: Not on file  Transportation Needs: No Transportation Needs   Lack of Transportation (Medical): No   Lack of Transportation (Non-Medical): No     Distress Screen completed: Yes    02/22/2022   11:53 AM  ONCBCN DISTRESS SCREENING  Screening Type Initial Screening  Distress experienced in past week (1-10) 9  Emotional problem type Depression;Nervousness/Anxiety;Feeling hopeless;Adjusting to appearance changes  Spiritual/Religous concerns type Facing my mortality;Loss of sense of purpose  Physical Problem type Mouth sores/swallowing;Sexual problems;Skin dry/itchy      Family/Social Information:  Housing Arrangement: patient lives with spouse- Allison Pierce Family members/support persons in your life? Husband, Family (son in [place] state,  grandson in [place], granddaughter in [place]) and Friends Transportation concerns: no  Employment: Retired .  Income source: Paediatric nurse concerns: No Type of concern: None Food access concerns: no Religious or spiritual practice: Not known Services Currently in place:  Medicare, psychiatric meds through PCP  Coping/ Adjustment to diagnosis: Patient understands treatment plan and what happens next? yes, feeling better with learning more information. Has hx of depression and anxiety but is trying to stay positive and surrounded by uplifting people (husband, certain friends) Concerns about diagnosis and/or treatment: Body image General adjustment Patient reported stressors: Depression, Anxiety/ nervousness, Feeling hopeless, and Facing my mortality Patient enjoys time with family/ friends Current coping skills/ strengths: Active sense of humor , Capable of independent living , Motivation for treatment/growth , and Supportive family/friends     SUMMARY: Current SDOH Barriers:  Mental Health Concerns   Clinical Social Work Clinical Goal(s):  Pt will continue to work with professional supports to address needs related to mental health  Interventions: Discussed common feeling and emotions when being diagnosed with cancer, and the importance of support during treatment Informed patient of the support team roles and support services at [facility] Provided [place] contact information and encouraged patient to call with any questions or concerns Provided patient with information about options for wigs   Follow Up Plan: Patient will contact CSW with any support or resource needs Patient verbalizes understanding of plan: Yes    Tatsuo Musial E Ophia Shamoon, LCSW

## 2022-02-22 NOTE — Assessment & Plan Note (Signed)
02/10/2022:Palpable left breast masses and calcifications: 1.8 cm at 1:00 and 1.7 cm at 9:00, calcifications not biopsy.  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 After reviewing the pathology in detail, we proceeded to discuss the different treatment options between surgery, radiation, chemotherapy, antiestrogen therapies.  Treatment plan: 1.  Mastectomy with sentinel lymph node biopsy 2. adjuvant chemotherapy with Taxol [name] weekly x12 followed by [name] maintenance for 1 year 3.  Plus or minus radiation  Return to clinic after surgery to discuss the final pathology report. Discussed the risks and benefits of chemotherapy.

## 2022-02-23 ENCOUNTER — Ambulatory Visit (INDEPENDENT_AMBULATORY_CARE_PROVIDER_SITE_OTHER): Payer: Medicare Other | Admitting: Plastic Surgery

## 2022-02-23 ENCOUNTER — Encounter: Payer: Self-pay | Admitting: Genetic Counselor

## 2022-02-23 VITALS — BP 158/79 | HR 73 | Ht 66.0 in | Wt 159.2 lb

## 2022-02-23 DIAGNOSIS — Z171 Estrogen receptor negative status [ER-]: Secondary | ICD-10-CM

## 2022-02-23 DIAGNOSIS — Z803 Family history of malignant neoplasm of breast: Secondary | ICD-10-CM | POA: Insufficient documentation

## 2022-02-23 DIAGNOSIS — D0502 Lobular carcinoma in situ of left breast: Secondary | ICD-10-CM | POA: Diagnosis not present

## 2022-02-23 DIAGNOSIS — C50412 Malignant neoplasm of upper-outer quadrant of left female breast: Secondary | ICD-10-CM

## 2022-02-23 DIAGNOSIS — Z8042 Family history of malignant neoplasm of prostate: Secondary | ICD-10-CM | POA: Insufficient documentation

## 2022-02-23 DIAGNOSIS — I10 Essential (primary) hypertension: Secondary | ICD-10-CM | POA: Diagnosis not present

## 2022-02-23 NOTE — Progress Notes (Signed)
REFERRING PROVIDER: Nicholas Lose, MD [address] [place],  [place] [address]  PRIMARY PROVIDER:  [name], MD  PRIMARY REASON FOR VISIT:  1. Malignant neoplasm of upper-outer quadrant of left breast in female, estrogen receptor negative ([place])   2. Family history of breast cancer   3. Family history of prostate cancer     HISTORY OF PRESENT ILLNESS:   Allison Pierce, a 76 y.o. female, was seen for a [facility] cancer genetics consultation at the request of Dr. Lindi Adie due to a personal and family history of cancer.  Allison Pierce presents to clinic today to discuss the possibility of a hereditary predisposition to cancer, to discuss genetic testing, and to further clarify her future cancer risks, as well as potential cancer risks for family members.   In May 2023, at the age of 65, Allison Pierce was diagnosed with invasive lobular carcinoma of the left breast.  CANCER HISTORY:  Oncology History  Malignant neoplasm of upper-outer quadrant of left breast in female, estrogen receptor negative ([place])  02/10/2022 Initial Diagnosis   Palpable left breast masses and calcifications: 1.8 cm at 1:00 and 1.7 cm at 9:00, calcifications not biopsy.  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  First live birth at age 70.  OCP use for approximately  10+  years.  Ovaries intact: no.  Uterus intact: no.  Menopausal status: postmenopausal.  HRT use: yes, unsure how long. Colonoscopy: yes Mammogram within the last year: yes. Any excessive radiation exposure in the past: no  Past Medical History:   Diagnosis Date   Anxiety    Depression    GERD (gastroesophageal reflux disease) OCCASIONALLY  TAKE TUMS   Hyperlipidemia    Hypertension    Vulvar lesion     Past Surgical History:  Procedure Laterality Date   ABDOMINAL HYSTERECTOMY  1992   W/ SALPINGO-OOPHORECTOMY   ANTERIOR CERVICAL DECOMP/DISCECTOMY FUSION  01-18-2009  DR POOLE   C4  - C6   AUGMENTATION MAMMAPLASTY Bilateral    BREAST BIOPSY Right    BREAST ENHANCEMENT SURGERY  2011   BREAST EXCISIONAL BIOPSY Left    BREAST SURGERY  02-04-2011  dr Margot Chimes   EXCISION LEFT BREAST MASS--  CALCIFICATION   VULVAR LESION REMOVAL  09/10/2012   Procedure: VULVAR LESION;  Surgeon: Selinda Orion, MD;  Location[facility];  Service: Gynecology;  Laterality: N/A;  WIDE EXCISION OF VULVAR LESION    Social History   Socioeconomic History   Marital status: Married    Spouse name: Not on file   Number of children: Not on file   Years of education: 12   Highest education level: Not on file  Occupational History   Not on file  Tobacco Use   Smoking status: Never   Smokeless tobacco: Never  Vaping Use   Vaping Use: Never used  Substance and Sexual Activity   Alcohol use: Yes    Alcohol/week: 7.0 standard drinks    Types: 7 Cans of beer per week   Drug use: Not on file   Sexual activity:  Not on file  Other Topics Concern   Not on file  Social History Narrative   Graduated HS      Right handed      Lives with husband   Social Determinants of Health   Financial Resource Strain: Low Risk    Difficulty of Paying Living Expenses: Not hard at all  Food Insecurity: No Food Insecurity   Worried About Charity fundraiser in the Last Year: Never true   [place] in the Last Year: Never true  Transportation Needs: No Transportation Needs   Lack of Transportation (Medical): No   Lack of Transportation (Non-Medical): No  Physical Activity: Not on file  Stress: Not on file  Social Connections: Not on  file     FAMILY HISTORY:  We obtained a detailed, 4-generation family history.  Significant diagnoses are listed below: Family History  Problem Relation Age of Onset   Lung cancer Mother 61       she smoked   Prostate cancer Brother 87   Breast cancer Cousin        paternal first cousin   Breast cancer Cousin        paternal first cousin      Allison Pierce's brother was diagnosed with prostate cancer at age 26. Her mother was diagnosed with lung cancer at age 44, she smoked and died at age 60. Her father had a history of skin cancer and died at age 38. Of note, Allison Pierce stated that her father had one breast removed but she was never told it was due to cancer. She has two paternal cousins who were diagnosed with breast cancer in their 24s, they are both deceased. Allison Pierce is unaware of previous family history of genetic testing for hereditary cancer risks. There is no reported Ashkenazi Jewish ancestry.  GENETIC COUNSELING ASSESSMENT: Allison Pierce is a 76 y.o. female with a personal and family history of cancer which is somewhat suggestive of a hereditary predisposition to cancer. We, therefore, discussed and recommended the following at today's visit.   DISCUSSION: We discussed that 5 - 10% of cancer is hereditary, with most cases of breast cancer associated with BRCA1/2.  There are other genes that can be associated with hereditary breast cancer syndromes.  We discussed that testing is beneficial for several reasons including knowing how to follow individuals after completing their treatment, identifying whether potential treatment options would be beneficial, and understanding if other family members could be at risk for cancer and allowing them to undergo genetic testing.   We reviewed the characteristics, features and inheritance patterns of hereditary cancer syndromes. We also discussed genetic testing, including the appropriate family members to test, the process of testing,  insurance coverage and turn-around-time for results. We discussed the implications of a negative, positive, carrier and/or variant of uncertain significant result. We recommended Allison Pierce pursue genetic testing for a panel that includes genes associated with breast and prostate cancer.   Allison Pierce elected to have [facility] CustomNext Panel. The CustomNext-Cancer+RNAinsight panel offered by [facility] includes sequencing and rearrangement analysis for the following 48 genes:  APC, ATM, AXIN2, BARD1, BMPR1A, BRCA1, BRCA2, BRIP1, CDH1, CDK4, CDKN2A, CHEK2, CTNNA1, DICER1, EGFR, EPCAM, GREM1, HOXB13, KIT, MEN1, MLH1, MSH2, MSH3, MSH6, MUTYH, NBN, NF1, NTHL1, PALB2, PDGFRA, PMS2, POLD1, POLE, PTEN, RAD50, RAD51C, RAD51D, SDHA, SDHB, SDHC, SDHD, SMAD4, SMARCA4, STK11, TP53, TSC1, TSC2, and VHL.  RNA data is routinely analyzed for use in variant interpretation for all genes.  Based on Allison Pierce's personal and family history of cancer, she meets medical criteria for genetic testing. Despite that she meets criteria, she may still have an out of pocket cost. We discussed that if her out of pocket cost for testing is over $100, the laboratory will call and confirm whether she wants to proceed with testing.  If the out of pocket cost of testing is less than $100 she will be billed by the genetic testing laboratory.   PLAN: After considering the risks, benefits, and limitations, Allison Pierce provided informed consent to pursue genetic testing and the blood sample was sent to [facility] for analysis of the CustomNext Panel. Results should be available within approximately 2-3 weeks' time, at which point they will be disclosed by telephone to Allison Pierce, as will any additional recommendations warranted by these results. Allison Pierce will receive a summary of her genetic counseling visit and a copy of her results once available. This information will also be available in Epic.   Allison Pierce questions were  answered to her satisfaction today. Our contact information was provided should additional questions or concerns arise. Thank you for the referral and allowing [place] to share in the care of your patient.   Lucille Passy, MS, [facility] Genetic Counselor [email or web address] (P) [phone]  The patient was seen for a total of 20 minutes in face-to-face genetic counseling.  The patient brought her husband. Drs. Lindi Adie and/or Burr Medico were available to discuss this case as needed.   _______________________________________________________________________ For Office Staff:  Number of people involved in session: 2 Was an Intern/ student involved with case: no

## 2022-02-23 NOTE — Progress Notes (Signed)
j

## 2022-02-23 NOTE — Progress Notes (Signed)
Referring Provider [name], MD [address] [place],  [place] [address]   CC:  Chief Complaint  Patient presents with   Consult      Allison Pierce is an 76 y.o. female.  HPI: Patient presents to discuss reconstruction for a new diagnosis of left-sided breast cancer.  This has biopsied out to be the lobular carcinoma.  It looks like a large enough area that mastectomy will be recommended.  She has a history of retropectoral implants that were put in for cosmetic reasons.  Most recent implants were placed 7 years ago.  She believes they are gel implants.  She does have a history of a biopsy in the upper outer quadrant of the left breast which was done some years ago and was benign.  She does not smoke and is not diabetic.  She has seen Dr. Ninfa Linden as her breast surgeon.  She is uncertain regarding postmastectomy radiation recommendation.  Allergies  Allergen Reactions   Gadolinium Derivatives Itching and Other (See Comments)    Redness and itching, skins feels like it was burning. In the future pt will need pre med.   Prozac [Fluoxetine Hcl] Other (See Comments)    "COULDN'T LIFT HEAD AND GET OUT OF BED"   Pregabalin Anxiety    "FELT WEIRD"    Outpatient Encounter Medications as of 02/23/2022  Medication Sig   buPROPion (WELLBUTRIN XL) 300 MG 24 hr tablet Take 300 mg by mouth every morning.   calcium carbonate (TUMS - DOSED IN MG ELEMENTAL CALCIUM) 500 MG chewable tablet Chew 1 tablet by mouth as needed.   clonazePAM (KLONOPIN) 1 MG tablet Take 1 mg by mouth at bedtime.   ezetimibe (ZETIA) 10 MG tablet Take 10 mg by mouth daily.   famotidine (PEPCID) 40 MG tablet Take 40 mg by mouth at bedtime.   fluticasone (FLONASE) 50 MCG/ACT nasal spray Place into both nostrils daily.   meloxicam (MOBIC) 15 MG tablet Take 15 mg by mouth daily.   POTASSIUM GLUCONATE PO Take by mouth.   TRAZODONE HCL PO Take 200 mg by mouth at bedtime.   venlafaxine (EFFEXOR) 75  MG tablet Take 75 mg by mouth daily.   [DISCONTINUED] donepezil (ARICEPT) 10 MG tablet Take half tablet (5 mg) daily for 2 weeks, then increase to the full tablet at 10 mg daily   [DISCONTINUED] naproxen sodium (ALEVE) 220 MG tablet Take 220 mg by mouth 2 (two) times daily as needed.   [DISCONTINUED] rosuvastatin (CRESTOR) 10 MG tablet Take 10 mg by mouth daily.   [DISCONTINUED] simvastatin (ZOCOR) 20 MG tablet Take 20 mg by mouth daily.   No facility-administered encounter medications on file as of 02/23/2022.     Past Medical History:  Diagnosis Date   Anxiety    Depression    GERD (gastroesophageal reflux disease) OCCASIONALLY  TAKE TUMS   Hyperlipidemia    Hypertension    Vulvar lesion     Past Surgical History:  Procedure Laterality Date   ABDOMINAL HYSTERECTOMY  1992   W/ SALPINGO-OOPHORECTOMY   ANTERIOR CERVICAL DECOMP/DISCECTOMY FUSION  01-18-2009  DR POOLE   C4  - C6   AUGMENTATION MAMMAPLASTY Bilateral    BREAST BIOPSY Right    BREAST ENHANCEMENT SURGERY  2011   BREAST EXCISIONAL BIOPSY Left    BREAST SURGERY  02-04-2011  dr Margot Chimes   EXCISION LEFT BREAST MASS--  CALCIFICATION   VULVAR LESION REMOVAL  09/10/2012   Procedure: VULVAR LESION;  Surgeon:  Selinda Orion, MD;  Location: [facility];  Service: Gynecology;  Laterality: N/A;  WIDE EXCISION OF VULVAR LESION    Family History  Problem Relation Age of Onset   Breast cancer Cousin    Breast cancer Cousin     Social History   Social History Narrative   Graduated HS      Right handed      Lives with husband     Review of Systems General: Denies fevers, chills, weight loss CV: Denies chest pain, shortness of breath, palpitations  Physical Exam    02/23/2022   11:25 AM 02/22/2022    8:50 AM 10/06/2021    2:29 PM  Vitals with BMI  Height '5\' 6"'$  '5\' 6"'$    Weight 159 lbs 3 oz 159 lbs 3 oz 157 lbs  BMI 09.47 09.62   Systolic [phone]  Diastolic 79 88 79  Pulse 73 79 87    General:   No acute distress,  Alert and oriented, Non-Toxic, Normal speech and affect Breast: Evidence of submuscular implants.  Generally has a symmetric shape.  May have some mild capsular contracture on both sides.  Areola is lower on the right side than the left.  Palpable mass on the left side in the subareolar area.  Upper outer quadrant curvilinear scar from previous breast biopsy.  Base width 12 cm  Assessment/Plan Patient presents with new diagnosis of breast cancer on the left side.  She is planning for mastectomy on the left.  Upcoming MRI scan will help determine what to do with the right side but no current plans for right-sided surgery that I can tell.  I do not think she will be a candidate for nipple sparing mastectomy given proximity of the mass to the areola.  I do think she would be a good candidate for tissue expander placement at the time of the mastectomy.  I would more than likely place this in a submuscular pocket as the pocket is already been developed and her implant on the right side is submuscular as well.  We discussed that this would be a stage reconstruction with placement of implants at subsequent time.  The timing of this would depend on what she ends up needing in terms of adjuvant treatments.  We discussed risks include bleeding, infection, damage to surrounding structures and need for additional procedures.  All of her questions were answered she is interested in moving forward.  [name] 02/23/2022, 12:15 PM

## 2022-02-24 ENCOUNTER — Ambulatory Visit
Admission: RE | Admit: 2022-02-24 | Discharge: 2022-02-24 | Disposition: A | Discharge status: Home / Self Care | Payer: Medicare Other | Source: Ambulatory Visit | Attending: Hematology and Oncology | Admitting: Hematology and Oncology

## 2022-02-24 ENCOUNTER — Other Ambulatory Visit: Payer: Self-pay | Admitting: Hematology and Oncology

## 2022-02-24 DIAGNOSIS — R922 Inconclusive mammogram: Secondary | ICD-10-CM | POA: Diagnosis not present

## 2022-02-24 DIAGNOSIS — Z171 Estrogen receptor negative status [ER-]: Secondary | ICD-10-CM

## 2022-02-24 DIAGNOSIS — C50212 Malignant neoplasm of upper-inner quadrant of left female breast: Secondary | ICD-10-CM

## 2022-03-01 ENCOUNTER — Ambulatory Visit
Admission: RE | Admit: 2022-03-01 | Discharge: 2022-03-01 | Disposition: A | Discharge status: Home / Self Care | Payer: Medicare Other | Source: Ambulatory Visit | Attending: Hematology and Oncology | Admitting: Hematology and Oncology

## 2022-03-01 ENCOUNTER — Telehealth: Payer: Self-pay

## 2022-03-01 ENCOUNTER — Ambulatory Visit (HOSPITAL_COMMUNITY)
Admission: RE | Admit: 2022-03-01 | Discharge: 2022-03-01 | Disposition: A | Discharge status: Home / Self Care | Payer: Medicare Other | Source: Ambulatory Visit | Attending: Hematology and Oncology | Admitting: Hematology and Oncology

## 2022-03-01 ENCOUNTER — Encounter: Payer: Medicare Other | Admitting: Psychology

## 2022-03-01 DIAGNOSIS — E785 Hyperlipidemia, unspecified: Secondary | ICD-10-CM | POA: Diagnosis not present

## 2022-03-01 DIAGNOSIS — C50212 Malignant neoplasm of upper-inner quadrant of left female breast: Secondary | ICD-10-CM

## 2022-03-01 DIAGNOSIS — R55 Syncope and collapse: Secondary | ICD-10-CM | POA: Insufficient documentation

## 2022-03-01 DIAGNOSIS — Z0189 Encounter for other specified special examinations: Secondary | ICD-10-CM | POA: Diagnosis not present

## 2022-03-01 DIAGNOSIS — C50919 Malignant neoplasm of unspecified site of unspecified female breast: Secondary | ICD-10-CM | POA: Diagnosis not present

## 2022-03-01 DIAGNOSIS — C50412 Malignant neoplasm of upper-outer quadrant of left female breast: Secondary | ICD-10-CM

## 2022-03-01 DIAGNOSIS — I340 Nonrheumatic mitral (valve) insufficiency: Secondary | ICD-10-CM | POA: Diagnosis not present

## 2022-03-01 DIAGNOSIS — Z171 Estrogen receptor negative status [ER-]: Secondary | ICD-10-CM | POA: Diagnosis not present

## 2022-03-01 LAB — ECHOCARDIOGRAM COMPLETE
Area-P 1/2: 5.38 cm2
Calc EF: 63.1 %
S' Lateral: 3 cm
Single Plane A2C EF: 60.2 %
Single Plane A4C EF: 65 %

## 2022-03-01 NOTE — Telephone Encounter (Signed)
Patients test was rescheduled after allergy identified; spoke with patient in person at 315 location to review instructions for 13 hr prep for MRI w/ contrast on 03/03/22 at 0950AM. Prescription for both Prednisone and Benadryl were called into [place]. Pt aware and verbalized understanding of instructions.   Prescription: 03/02/22 0850pm- '50mg'$  Prednisone 03/03/22 0250am- '50mg'$  Prednisone 03/03/22 0850am- '50mg'$  Prednisone and '50mg'$  Benadryl.   Pt aware she will need a driver day of procedure, verbalized understanding.

## 2022-03-01 NOTE — Progress Notes (Signed)
  Echocardiogram 2D Echocardiogram has been performed.  [name] 03/01/2022, 9:46 AM

## 2022-03-02 ENCOUNTER — Telehealth: Payer: Self-pay | Admitting: *Deleted

## 2022-03-02 ENCOUNTER — Encounter: Payer: Self-pay | Admitting: *Deleted

## 2022-03-02 ENCOUNTER — Telehealth: Payer: Self-pay | Admitting: Plastic Surgery

## 2022-03-02 NOTE — Telephone Encounter (Signed)
[place] to talk with surgery scheduler. Patient was seen 02/22/2022, but orders for surgery have not been sent. Scheduler, Tonya, will call me once orders are received.

## 2022-03-02 NOTE — Telephone Encounter (Signed)
Left message for a return phone call to follow up from [facility] 5/24 and assess navigation needs.

## 2022-03-03 ENCOUNTER — Ambulatory Visit
Admission: RE | Admit: 2022-03-03 | Discharge: 2022-03-03 | Disposition: A | Discharge status: Home / Self Care | Payer: Medicare Other | Source: Ambulatory Visit | Attending: Hematology and Oncology | Admitting: Hematology and Oncology

## 2022-03-03 DIAGNOSIS — C50912 Malignant neoplasm of unspecified site of left female breast: Secondary | ICD-10-CM | POA: Diagnosis not present

## 2022-03-03 MED ORDER — GADOBUTROL 1 MMOL/ML IV SOLN
7.0000 mL | Freq: Once | INTRAVENOUS | Status: AC | PRN
  Administered 2022-03-03: 7 mL via INTRAVENOUS

## 2022-03-06 ENCOUNTER — Telehealth: Payer: Self-pay | Admitting: Genetic Counselor

## 2022-03-06 ENCOUNTER — Encounter: Payer: Self-pay | Admitting: *Deleted

## 2022-03-06 ENCOUNTER — Encounter: Payer: Self-pay | Admitting: Genetic Counselor

## 2022-03-06 DIAGNOSIS — Z1379 Encounter for other screening for genetic and chromosomal anomalies: Secondary | ICD-10-CM | POA: Insufficient documentation

## 2022-03-06 NOTE — Telephone Encounter (Signed)
I contacted [name] to discuss her genetic testing results. No pathogenic variants were identified in the 48 genes analyzed. Detailed clinic note to follow.  The test report has been scanned into EPIC and is located under the Molecular Pathology section of the Results Review tab.  A portion of the result report is included below for reference.   Allison Passy, MS, [facility] Genetic Counselor [email or web address] (P) [phone]

## 2022-03-06 NOTE — Telephone Encounter (Signed)
I contacted Ms. Domke to discuss her genetic testing results. No pathogenic variants were identified in the 48 genes analyzed. Detailed clinic note to follow.  The test report has been scanned into EPIC and is located under the Molecular Pathology section of the Results Review tab.  A portion of the result report is included below for reference.   Lucille Passy, MS, [facility] Genetic Counselor [email or web address] (P) [phone]

## 2022-03-07 ENCOUNTER — Encounter: Payer: Self-pay | Admitting: *Deleted

## 2022-03-07 ENCOUNTER — Other Ambulatory Visit: Payer: Self-pay | Admitting: Surgery

## 2022-03-07 ENCOUNTER — Encounter: Payer: Medicare Other | Admitting: Psychology

## 2022-03-08 DIAGNOSIS — E782 Mixed hyperlipidemia: Secondary | ICD-10-CM | POA: Diagnosis not present

## 2022-03-08 DIAGNOSIS — I10 Essential (primary) hypertension: Secondary | ICD-10-CM | POA: Diagnosis not present

## 2022-03-09 ENCOUNTER — Telehealth: Payer: Self-pay | Admitting: *Deleted

## 2022-03-09 ENCOUNTER — Encounter: Payer: Self-pay | Admitting: *Deleted

## 2022-03-09 ENCOUNTER — Encounter: Payer: Self-pay | Admitting: Genetic Counselor

## 2022-03-09 NOTE — Telephone Encounter (Signed)
Msg sent to Dr. Barnet Pall NP regarding possibility of starting AI prior to tx.

## 2022-03-10 ENCOUNTER — Telehealth: Payer: Self-pay | Admitting: Hematology and Oncology

## 2022-03-10 NOTE — Telephone Encounter (Signed)
.  Called patient to schedule appointment per 6/8 inbasket, patient is aware of date and time.   

## 2022-03-13 ENCOUNTER — Ambulatory Visit: Payer: Self-pay | Admitting: Genetic Counselor

## 2022-03-13 ENCOUNTER — Telehealth: Payer: Self-pay | Admitting: *Deleted

## 2022-03-13 ENCOUNTER — Encounter: Payer: Self-pay | Admitting: *Deleted

## 2022-03-13 DIAGNOSIS — Z1379 Encounter for other screening for genetic and chromosomal anomalies: Secondary | ICD-10-CM

## 2022-03-13 MED ORDER — LETROZOLE 2.5 MG PO TABS: 2.5000 mg | ORAL_TABLET | Freq: Every day | ORAL | 6 refills | Status: DC

## 2022-03-13 NOTE — Telephone Encounter (Signed)
Spoke to pt concerning starting AI. Provided instructions and next steps. No further needs voiced.

## 2022-03-13 NOTE — Telephone Encounter (Signed)
Per Dr. Lindi Adie, start Letrozole 2.'5mg'$  daily as surgery is not scheduled until July.  Called pt, left detailed msg. Request return call for questions.

## 2022-03-13 NOTE — Progress Notes (Signed)
HPI:   Allison Pierce was previously seen in the [place] clinic due to a personal and family history of cancer and concerns regarding a hereditary predisposition to cancer. Please refer to our prior cancer genetics clinic note for more information regarding our discussion, assessment and recommendations, at the time. Allison Pierce recent genetic test results were disclosed to Allison, as were recommendations warranted by these results. These results and recommendations are discussed in more detail below.  CANCER HISTORY:  Oncology History  Malignant neoplasm of upper-outer quadrant of left breast in female, estrogen receptor negative ([place])  02/10/2022 Initial Diagnosis   Palpable left breast masses and calcifications: 1.8 cm at 1:00 and 1.7 cm at 9:00, calcifications not biopsy.  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 Report date is 03/05/2022.  The CustomNext-Cancer+RNAinsight panel offered by [facility] includes sequencing and rearrangement analysis for the following 48 genes:  APC, ATM, AXIN2, BARD1, BMPR1A, BRCA1, BRCA2, BRIP1, CDH1, CDK4, CDKN2A, CHEK2, CTNNA1, DICER1, EGFR, EPCAM, GREM1, HOXB13, KIT, MEN1, MLH1, MSH2, MSH3, MSH6, MUTYH, NBN, NF1, NTHL1, PALB2, PDGFRA, PMS2, POLD1, POLE, PTEN, RAD50, RAD51C, RAD51D, SDHA, SDHB, SDHC, SDHD, SMAD4, SMARCA4, STK11, TP53, TSC1, TSC2, and VHL.  RNA data is routinely analyzed for use in variant interpretation for all genes.     FAMILY HISTORY:  We obtained a detailed, 4-generation family history.  Significant diagnoses are listed below:       Family History  Problem Relation Age of Onset   Lung cancer Mother 26        she smoked   Prostate cancer Brother 12   Breast cancer Cousin          paternal first cousin   Breast cancer Cousin          paternal first cousin         Allison Pierce's brother was diagnosed with prostate cancer at age 19. Allison mother was diagnosed with lung cancer at age 76, she smoked and died at age 24. Allison father had a history of skin cancer and died at age 33. Of note, Allison Pierce stated that Allison father had one breast removed but she was never told it was due to cancer. She has two paternal cousins who were diagnosed with breast cancer in their 78s, they are both deceased. Allison Pierce is unaware of previous family history of genetic testing for hereditary cancer risks. There is no reported Ashkenazi Jewish ancestry.  GENETIC TEST RESULTS:  The [facility] CustomNext Panel found no pathogenic mutations.  The CustomNext-Cancer+RNAinsight panel offered by [facility] includes sequencing and rearrangement analysis for the following 48 genes:  APC, ATM, AXIN2, BARD1, BMPR1A, BRCA1, BRCA2, BRIP1, CDH1, CDK4, CDKN2A, CHEK2, CTNNA1, DICER1, EGFR, EPCAM, GREM1, HOXB13, KIT, MEN1, MLH1, MSH2, MSH3, MSH6, MUTYH, NBN, NF1, NTHL1, PALB2, PDGFRA, PMS2, POLD1, POLE, PTEN, RAD50, RAD51C, RAD51D, SDHA, SDHB, SDHC, SDHD, SMAD4, SMARCA4, STK11, TP53, TSC1, TSC2, and VHL.  RNA data is routinely analyzed for use in variant interpretation for all genes.  The test report has been scanned into EPIC and is located under the Molecular Pathology section  of the Results Review tab.  A portion of the result report is included below for reference. Genetic testing reported out on 03/05/2022.        Even though a pathogenic variant was not identified, possible explanations for Allison personal history of cancer may include: There may be no hereditary risk for cancer in the family. The cancers in Allison Pierce and/or Allison family may be due to other  genetic or environmental factors. There may be a gene mutation in one of these genes that current testing methods cannot detect, but that chance is small. There could be another gene that has not yet been discovered, or that we have not yet tested, that is responsible for the cancer diagnoses in the family.   Therefore, it is important to remain in touch with cancer genetics in the future so that we can continue to offer Allison Pierce the most up to date genetic testing.   ADDITIONAL GENETIC TESTING:  We discussed with Allison Pierce that Allison genetic testing was fairly extensive.  If there are genes identified to increase cancer risk that can be analyzed in the future, we would be happy to discuss and coordinate this testing at that time.    CANCER SCREENING RECOMMENDATIONS:  Allison Pierce test result is considered negative (normal).  This means that we have not identified a hereditary cause for Allison personal and family history of cancer at this time.   An individual's cancer risk and medical management are not determined by genetic test results alone. Overall cancer risk assessment incorporates additional factors, including personal medical history, family history, and any available genetic information that may result in a personalized plan for cancer prevention and surveillance. Therefore, it is recommended she continue to follow the cancer management and screening guidelines provided by Allison oncology and primary healthcare provider.  RECOMMENDATIONS FOR FAMILY MEMBERS:   Since she did not inherit a mutation in a cancer predisposition gene included on this panel, Allison Pierce could not have inherited a mutation from Allison in one of these genes. Individuals in this family might be at some increased risk of developing cancer, over the general population risk, due to the family history of cancer. We recommend women in this family have a yearly mammogram beginning at age 72, or 92 years younger than the earliest  onset of cancer, an annual clinical breast exam, and perform monthly breast self-exams.  FOLLOW-UP:  Cancer genetics is a rapidly advancing field and it is possible that new genetic tests will be appropriate for Allison and/or Allison family members in the future. We encouraged Allison to remain in contact with cancer genetics on an annual basis so we can update Allison personal and family histories and let Allison know of advances in cancer genetics that may benefit this family.   Our contact number was provided. Ms. Briguglio questions were answered to Allison satisfaction, and she knows she is welcome to call us at anytime with additional questions or concerns.   Lucille Passy, MS, [facility] Genetic Counselor [email or web address] (P) [phone]

## 2022-03-14 ENCOUNTER — Encounter: Payer: Medicare Other | Admitting: Psychology

## 2022-03-16 ENCOUNTER — Ambulatory Visit (INDEPENDENT_AMBULATORY_CARE_PROVIDER_SITE_OTHER): Payer: Medicare Other | Admitting: Surgical

## 2022-03-16 ENCOUNTER — Telehealth: Payer: Self-pay | Admitting: *Deleted

## 2022-03-16 ENCOUNTER — Encounter: Payer: Self-pay | Admitting: Surgical

## 2022-03-16 VITALS — BP 109/69 | HR 71 | Ht 64.0 in | Wt 158.4 lb

## 2022-03-16 DIAGNOSIS — C50412 Malignant neoplasm of upper-outer quadrant of left female breast: Secondary | ICD-10-CM

## 2022-03-16 DIAGNOSIS — Z171 Estrogen receptor negative status [ER-]: Secondary | ICD-10-CM

## 2022-03-16 MED ORDER — OXYCODONE HCL 5 MG PO TABS: 5.0000 mg | ORAL_TABLET | Freq: Four times a day (QID) | ORAL | 0 refills | Status: AC | PRN

## 2022-03-16 MED ORDER — ONDANSETRON HCL 4 MG PO TABS: 4.0000 mg | ORAL_TABLET | Freq: Three times a day (TID) | ORAL | 0 refills | Status: DC | PRN

## 2022-03-16 MED ORDER — SULFAMETHOXAZOLE-TRIMETHOPRIM 800-160 MG PO TABS: 1.0000 | ORAL_TABLET | Freq: Two times a day (BID) | ORAL | 0 refills | Status: AC

## 2022-03-16 NOTE — Progress Notes (Signed)
Patient ID: Allison Pierce, female    DOB: [birth year], 76 y.o.   MRN: [id number]  Chief Complaint  Patient presents with   Pre-op Exam      ICD-10-CM   1. Malignant neoplasm of upper-outer quadrant of left breast in female, estrogen receptor negative ([place])  C50.412    Z17.1       History of Present Illness: Allison Pierce is a 76 y.o.  female  with a history of left breast cancer.  She presents for preoperative evaluation for upcoming procedure, immediate left breast reconstruction placement of tissue expander and Flex HD, scheduled for 04/07/2022 with Dr.  Claudia Desanctis in conjunction with left mastectomy with Dr. Ninfa Linden  The patient has not had problems with anesthesia. No history of DVT/PE.  No family history of DVT/PE.  No family or personal history of bleeding or clotting disorders.  Patient is not currently taking any blood thinners.  No history of CVA/MI.   Summary of Previous Visit: Patient with recent diagnosis of left breast cancer, biopsy showed lobular carcinoma.  History of retropectoral implants that were placed for cosmetic reasons, most recent implant placement 7 years ago.  She believes they are gel.  She does not smoke, nondiabetic.  She is uncertain if she will require postmastectomy radiation.  PMH Significant for: Retropectoral implants placed for cosmetic reasons, most recent placement 7 years ago.  Patient presents today with her husband.  She is feeling well.  No recent changes to her health.  She denies any cardiac or pulmonary disease.  She reports plan for radiation/chemotherapy is unsure at this time, waiting for pathology results from surgery.  Past Medical History: Allergies: Allergies  Allergen Reactions   Gadolinium Derivatives Itching and Other (See Comments)    Redness and itching, skins feels like it was burning. In the future pt will need pre med.   Prozac [Fluoxetine Hcl] Other (See Comments)    "COULDN'T LIFT HEAD AND GET OUT OF BED"    Pregabalin Anxiety    "FELT WEIRD"    Current Medications:  Current Outpatient Medications:    B Complex-C (B COMPLEX-VITAMIN C) CAPS, Take 1 capsule by mouth daily., Disp: , Rfl:    buPROPion (WELLBUTRIN XL) 300 MG 24 hr tablet, Take 300 mg by mouth every morning., Disp: , Rfl:    calcium carbonate (TUMS - DOSED IN MG ELEMENTAL CALCIUM) 500 MG chewable tablet, Chew 1 tablet by mouth as needed., Disp: , Rfl:    clonazePAM (KLONOPIN) 1 MG tablet, Take 1 mg by mouth at bedtime., Disp: , Rfl:    ezetimibe (ZETIA) 10 MG tablet, Take 10 mg by mouth daily., Disp: , Rfl:    famotidine (PEPCID) 40 MG tablet, Take 40 mg by mouth at bedtime., Disp: , Rfl:    fluticasone (FLONASE) 50 MCG/ACT nasal spray, Place into both nostrils daily., Disp: , Rfl:    letrozole (FEMARA) 2.5 MG tablet, Take 1 tablet (2.5 mg total) by mouth daily., Disp: 30 tablet, Rfl: 6   losartan (COZAAR) 25 MG tablet, Take 25 mg by mouth daily., Disp: , Rfl:    meloxicam (MOBIC) 15 MG tablet, Take 15 mg by mouth daily., Disp: , Rfl:    OVER THE COUNTER MEDICATION, Biotin-Take 1 daily, Disp: , Rfl:    OVER THE COUNTER MEDICATION, Allergy med-Take 1 daily., Disp: , Rfl:    OVER THE COUNTER MEDICATION, Vitamin D-Take 1 daily., Disp: , Rfl:    OVER THE COUNTER MEDICATION, Vitamin B12-Take  1 daily., Disp: , Rfl:    POTASSIUM GLUCONATE PO, Take by mouth., Disp: , Rfl:    TRAZODONE HCL PO, Take 200 mg by mouth at bedtime., Disp: , Rfl:    venlafaxine (EFFEXOR) 75 MG tablet, Take 75 mg by mouth daily., Disp: , Rfl:   Past Medical Problems: Past Medical History:  Diagnosis Date   Anxiety    Depression    GERD (gastroesophageal reflux disease) OCCASIONALLY  TAKE TUMS   Hyperlipidemia    Hypertension    Vulvar lesion     Past Surgical History: Past Surgical History:  Procedure Laterality Date   ABDOMINAL HYSTERECTOMY  1992   W/ SALPINGO-OOPHORECTOMY   ANTERIOR CERVICAL DECOMP/DISCECTOMY FUSION  01-18-2009  DR POOLE   C4  -  C6   AUGMENTATION MAMMAPLASTY Bilateral    BREAST BIOPSY Right    BREAST BIOPSY Left 02/10/2022   x2   BREAST ENHANCEMENT SURGERY  2011   BREAST EXCISIONAL BIOPSY Left    BREAST SURGERY  02-04-2011  dr Margot Chimes   EXCISION LEFT BREAST MASS--  [facility] REMOVAL  09/10/2012   Procedure: VULVAR LESION;  Surgeon: Selinda Orion, MD;  Location: [facility];  Service: Gynecology;  Laterality: N/A;  WIDE EXCISION OF VULVAR LESION    Social History: Social History   Socioeconomic History   Marital status: Married    Spouse name: Not on file   Number of children: Not on file   Years of education: 12   Highest education level: Not on file  Occupational History   Not on file  Tobacco Use   Smoking status: Never   Smokeless tobacco: Never  Vaping Use   Vaping Use: Never used  Substance and Sexual Activity   Alcohol use: Yes    Alcohol/week: 7.0 standard drinks of alcohol    Types: 7 Cans of beer per week   Drug use: Not on file   Sexual activity: Not on file  Other Topics Concern   Not on file  Social History Narrative   Graduated HS      Right handed      Lives with husband   Social Determinants of Health   Financial Resource Strain: Low Risk  (02/22/2022)   Overall Financial Resource Strain (CARDIA)    Difficulty of Paying Living Expenses: Not hard at all  Food Insecurity: No Food Insecurity (02/22/2022)   Hunger Vital Sign    Worried About Running Out of Food in the Last Year: Never true    Ran Out of Food in the Last Year: Never true  Transportation Needs: No Transportation Needs (02/22/2022)   PRAPARE - Hydrologist (Medical): No    Lack of Transportation (Non-Medical): No  Physical Activity: Not on file  Stress: Not on file  Social Connections: Not on file  Intimate Partner Violence: Not on file    Family History: Family History  Problem Relation Age of Onset   Lung cancer Mother 70       she smoked    Prostate cancer Brother 69   Breast cancer Cousin        paternal first cousin   Breast cancer Cousin        paternal first cousin    Review of Systems: Review of Systems  Constitutional: Negative.   Respiratory: Negative.    Cardiovascular: Negative.   Gastrointestinal: Negative.   Neurological: Negative.     Physical Exam: Vital Signs BP  109/69 (BP Location: Left Arm, Patient Position: Sitting, Cuff Size: Normal)   Pulse 71   Ht '5\' 4"'$  (1.626 m)   Wt 158 lb 6.4 oz (71.8 kg)   SpO2 97%   BMI 27.19 kg/m   Physical Exam  Constitutional:      General: Not in acute distress.    Appearance: Normal appearance. Not ill-appearing.  HENT:     Head: Normocephalic and atraumatic.  Eyes:     Pupils: Pupils are equal, round Neck:     Musculoskeletal: Normal range of motion.  Cardiovascular:     Rate and Rhythm: Normal rate    Pulses: Normal pulses.  Pulmonary:     Effort: Pulmonary effort is normal. No respiratory distress.  Musculoskeletal: Normal range of motion.  Skin:    General: Skin is warm and dry.     Findings: No erythema or rash.  Neurological:     General: No focal deficit present.     Mental Status: Alert and oriented to person, place, and time. Mental status is at baseline.     Motor: No weakness.  Psychiatric:        Mood and Affect: Mood normal.        Behavior: Behavior normal.    Assessment/Plan: The patient is scheduled for immediate left breast reconstruction with placement of tissue expander and Flex HD with Dr. Claudia Desanctis.  Risks, benefits, and alternatives of procedure discussed, questions answered and consent obtained.    Smoking Status: Non-smoker; Counseling Given?  N/A  Caprini Score: 8; Risk Factors include: age, BMI > 25, current diagnosis of L breast cancer and length of planned surgery. Recommendation for mechanical and possible pharmacological prophylaxis. Encourage early ambulation.   Pictures obtained:Pictures were obtained of the patient  and placed in the chart with the patient's or guardian's permission.  Post-op Rx sent to pharmacy: Oxycodone, Zofran, Bactrim  Patient was provided with the breast reconstruction and General Surgical Risk consent document and Pain Medication Agreement prior to their appointment.  They had adequate time to read through the risk consent documents and Pain Medication Agreement. We also discussed them in person together during this preop appointment. All of their questions were answered to their satisfaction.  Recommended calling if they have any further questions.  Risk consent form and Pain Medication Agreement to be scanned into patient's chart.  The risks that can be encountered with and after placement of a breast expander placement were discussed and include the following but not limited to these: bleeding, infection, delayed healing, anesthesia risks, skin sensation changes, injury to structures including nerves, blood vessels, and muscles which may be temporary or permanent, allergies to tape, suture materials and glues, blood products, topical preparations or injected agents, skin contour irregularities, skin discoloration and swelling, deep vein thrombosis, cardiac and pulmonary complications, pain, which may persist, fluid accumulation, wrinkling of the skin over the expander, changes in nipple or breast sensation, expander leakage or rupture, faulty position of the expander, persistent pain, formation of tight scar tissue around the expander (capsular contracture), possible need for revisional surgery or staged procedures.   Electronically signed by: Carola Rhine Ryanna Teschner, PA-C 03/16/2022 12:14 PM

## 2022-03-16 NOTE — Telephone Encounter (Signed)
Spoke to pt concerning compression gloves and socks during chemo administration. Provided information as well website/location to purchase. Received verbal understanding. No further needs voiced.

## 2022-03-29 ENCOUNTER — Encounter (HOSPITAL_COMMUNITY)
Admission: RE | Admit: 2022-03-29 | Discharge: 2022-03-29 | Disposition: A | Discharge status: Home / Self Care | Payer: Medicare Other | Source: Ambulatory Visit | Attending: Surgery | Admitting: Surgery

## 2022-03-29 ENCOUNTER — Other Ambulatory Visit: Payer: Self-pay

## 2022-03-29 ENCOUNTER — Encounter (HOSPITAL_COMMUNITY): Payer: Self-pay

## 2022-03-29 VITALS — BP 160/75 | HR 71 | Temp 97.7°F | Resp 17 | Ht 65.0 in | Wt 161.1 lb

## 2022-03-29 DIAGNOSIS — Z01818 Encounter for other preprocedural examination: Secondary | ICD-10-CM | POA: Diagnosis not present

## 2022-03-29 HISTORY — DX: Papillomavirus as the cause of diseases classified elsewhere: B97.7

## 2022-03-29 HISTORY — DX: Urinary tract infection, site not specified: N39.0

## 2022-03-29 LAB — CBC
HCT: 39.2 % (ref 36.0–46.0)
Hemoglobin: 12.8 g/dL (ref 12.0–15.0)
MCH: 28.4 pg (ref 26.0–34.0)
MCHC: 32.7 g/dL (ref 30.0–36.0)
MCV: 86.9 fL (ref 80.0–100.0)
Platelets: 331 10*3/uL (ref 150–400)
RBC: 4.51 MIL/uL (ref 3.87–5.11)
RDW: 12.5 % (ref 11.5–15.5)
WBC: 7 10*3/uL (ref 4.0–10.5)
nRBC: 0 % (ref 0.0–0.2)

## 2022-03-29 LAB — BASIC METABOLIC PANEL
Anion gap: 9 (ref 5–15)
BUN: 7 mg/dL — ABNORMAL LOW (ref 8–23)
CO2: 27 mmol/L (ref 22–32)
Calcium: 9.6 mg/dL (ref 8.9–10.3)
Chloride: 98 mmol/L (ref 98–111)
Creatinine, Ser: 0.99 mg/dL (ref 0.44–1.00)
GFR, Estimated: 59 mL/min — ABNORMAL LOW (ref >60)
Glucose, Bld: 99 mg/dL (ref 70–99)
Potassium: 3.9 mmol/L (ref 3.5–5.1)
Sodium: 134 mmol/L — ABNORMAL LOW (ref 135–145)

## 2022-03-29 NOTE — Progress Notes (Addendum)
PCP - [name] Cardiologist - Denies  PPM/ICD - Denies Device Orders -  Rep Notified -   Chest x-ray - N/I EKG - 03/29/22 Stress Test - Yes "It's been awhile" no f/u needed ECHO - 03/01/22 Cardiac Cath - Denies  Sleep Study - Denies  DM - Denies  Blood Thinner Instructions:Denies Aspirin Instructions:Denies  ERAS Protcol -yes  Anesthesia review: Yes abnormal EKG  Patient denies shortness of breath, fever, cough and chest pain at PAT appointment   All instructions explained to the patient, with a verbal understanding of the material. Patient agrees to go over the instructions while at home for a better understanding. The opportunity to ask questions was provided.

## 2022-03-30 ENCOUNTER — Ambulatory Visit: Payer: Medicare Other | Admitting: Physician Assistant

## 2022-04-05 NOTE — Progress Notes (Signed)
 Patient Care Team: Shaw, Kimberlee, MD as PCP - General (Family Medicine) Aquino, Karen M, MD as Consulting Physician (Neurology) Stuart, Dawn C, RN as Oncology Nurse Navigator Martini, Keisha N, RN as Oncology Nurse Navigator Blackman, Douglas, MD as Consulting Physician (General Surgery) , , MD as Consulting Physician (Hematology and Oncology) Squire, Sarah, MD as Attending Physician (Radiation Oncology)  DIAGNOSIS:  Encounter Diagnosis  Name Primary?   Malignant neoplasm of upper-outer quadrant of left breast in female, estrogen receptor negative (HCC) Yes    SUMMARY OF ONCOLOGIC HISTORY: Oncology History  Malignant neoplasm of upper-outer quadrant of left breast in female, estrogen receptor negative (HCC)  02/10/2022 Initial Diagnosis   Palpable left breast masses and calcifications: 1.8 cm at 1:00 and 1.7 cm at 9:00, calcifications not biopsy.  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 Report date is 03/05/2022.  The CustomNext-Cancer+RNAinsight panel offered by [facility] [employer] includes sequencing and rearrangement analysis for the following 48 genes:  APC, ATM, AXIN2, BARD1, BMPR1A, BRCA1, BRCA2, BRIP1, CDH1, CDK4, CDKN2A, CHEK2, CTNNA1, DICER1, EGFR, EPCAM, GREM1, HOXB13, KIT, MEN1, MLH1, MSH2, MSH3, MSH6, MUTYH, NBN, NF1, NTHL1, PALB2, PDGFRA, PMS2, POLD1, POLE, PTEN, RAD50, RAD51C, RAD51D, SDHA, SDHB, SDHC, SDHD, SMAD4, SMARCA4, STK11, TP53, TSC1, TSC2, and VHL.  RNA data is routinely analyzed for use in variant interpretation for all genes.   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, LCIS, 5/5 lymph nodes positive with extranodal extension, margins negative ER 0%, PR 0%, HER2 3+, Ki-67 20%     CHIEF COMPLIANT: Follow-up after surgery.  INTERVAL HISTORY: Allison Pierce is a 76 y.o. female is here because of recent diagnosis of left breast cancer. She presents to the clinic today for a follow-up to discuss final pathology report. States that she tolerated surgery well. Still having some soreness at the surgical site.   ALLERGIES:  is allergic to gadolinium derivatives, atorvastatin calcium, azelaic acid, oxybutynin, prozac [fluoxetine hcl], ritalin [methylphenidate], rosuvastatin, and lyrica [pregabalin].  MEDICATIONS:  Current Outpatient Medications  Medication Sig Dispense Refill   B Complex-C (B COMPLEX-VITAMIN C) CAPS Take 1 capsule by mouth daily.     buPROPion (WELLBUTRIN XL) 300 MG 24 hr tablet Take 300 mg by mouth every morning.     calcium carbonate (TUMS - DOSED IN MG ELEMENTAL CALCIUM) 500 MG chewable tablet Chew 1 tablet by mouth daily as needed for indigestion or heartburn.     cholecalciferol (VITAMIN D3) 25 MCG (1000 UNIT) tablet Take 1,000 Units by mouth daily.     clonazePAM (KLONOPIN) 1 MG tablet Take 1 mg by mouth at bedtime.     diphenhydrAMINE (BENADRYL) 25 MG tablet Take 25 mg by mouth in the morning and at bedtime.     fluticasone (FLONASE) 50 MCG/ACT nasal spray Place 1 spray into both nostrils daily as needed for allergies.     letrozole (FEMARA) 2.5 MG tablet Take 1 tablet (2.5 mg total) by mouth daily. 30 tablet 6   losartan (COZAAR) 25 MG tablet Take 50 mg by mouth daily.       meloxicam (MOBIC) 15 MG tablet Take 15 mg by mouth daily.     ondansetron (ZOFRAN) 4 MG tablet Take 1 tablet (4 mg total) by mouth every 8 (eight) hours as needed for nausea or vomiting. 20 tablet 0   Potassium 95 MG TABS Take 95 mg by mouth daily.     traZODone (DESYREL) 150 MG tablet Take 225 mg  by mouth at bedtime.     venlafaxine XR (EFFEXOR-XR) 75 MG 24 hr capsule Take 75 mg by mouth daily with breakfast.     vitamin B-12 (CYANOCOBALAMIN) 500 MCG tablet Take 500 mcg by mouth daily.     No current facility-administered medications for this visit.    PHYSICAL EXAMINATION: ECOG PERFORMANCE STATUS: 1 - Symptomatic but completely ambulatory  Vitals:   04/18/22 1040  BP: 133/70  Pulse: 89  Resp: 18  Temp: 97.7 F (36.5 C)  SpO2: 98%   Filed Weights   04/18/22 1040  Weight: 155 lb 14.4 oz (70.7 kg)    BREAST: No palpable masses or nodules in either right or left breasts. No palpable axillary supraclavicular or infraclavicular adenopathy no breast tenderness or nipple discharge. (exam performed in the presence of a chaperone)  LABORATORY DATA:  I have reviewed the data as listed    Latest Ref Rng & Units 03/29/2022    1:32 PM 02/22/2022    8:26 AM 09/16/2020    6:23 AM  CMP  Glucose 70 - 99 mg/dL 99  89  110   BUN 8 - 23 mg/dL 7  14  8   Creatinine 0.44 - 1.00 mg/dL 0.99  1.12  0.83   Sodium 135 - 145 mmol/L 134  136  136   Potassium 3.5 - 5.1 mmol/L 3.9  3.7  3.5   Chloride 98 - 111 mmol/L 98  103  102   CO2 22 - 32 mmol/L 27  27  24   Calcium 8.9 - 10.3 mg/dL 9.6  9.2  8.6   Total Protein 6.5 - 8.1 g/dL  6.7  6.0   Total Bilirubin 0.3 - 1.2 mg/dL  0.5  0.4   Alkaline Phos 38 - 126 U/L  91  62   AST 15 - 41 U/L  17  24   ALT 0 - 44 U/L  12  20     Lab Results  Component Value Date   WBC 7.0 03/29/2022   HGB 12.8 03/29/2022   HCT 39.2 03/29/2022   MCV 86.9 03/29/2022   PLT 331 03/29/2022   NEUTROABS 4.2 02/22/2022    ASSESSMENT & PLAN:  Malignant neoplasm of upper-outer quadrant of left breast in female, estrogen receptor negative (HCC) 04/07/2022:Left mastectomy: 4.2 cm invasive pleomorphic lobular carcinoma grade 2, LCIS, 5/5 lymph nodes positive with extranodal extension, margins negative, ER 0%, PR 0%, HER2 3+, Ki-67 20%  Pathology counseling: I  discussed the final pathology report of the patient provided  a copy of this report. I discussed the margins as well as lymph node surgeries. We also discussed the final staging along with previously performed ER/PR and HER-2/neu testing.  Treatment plan: 1.  ALND: Discussed the pros and cons of axillary lymph node dissection 2. adjuvant chemotherapy with TCHP x6 followed by [name] Perjeta maintenance 3.  Adjuvant radiation  Return to clinic to start chemotherapy.    Orders Placed This Encounter  Procedures   CT CHEST ABDOMEN PELVIS W CONTRAST    Standing Status:   Future    Standing Expiration   Date:   04/19/2023    Order Specific Question:   Preferred imaging location?    Answer:   [facility]    Order Specific Question:   Is Oral Contrast requested for this exam?    Answer:   Yes, Per Radiology protocol   NM Bone Scan Whole Body    Standing Status:   Future    Standing Expiration Date:   04/19/2023    Order Specific Question:   If indicated for the ordered procedure, I authorize the administration of a radiopharmaceutical per Radiology protocol    Answer:   Yes    Order Specific Question:   Preferred imaging location?    Answer:   [facility]   The patient has a good understanding of the overall plan. she agrees with it. she will call with any problems that may develop before the next visit here. Total time spent: 30 mins including face to face time and time spent for planning, charting and co-ordination of care   Harriette Ohara, MD 04/18/22    I Gardiner Coins am scribing for Dr. Lindi Adie  I have reviewed the above documentation for accuracy and completeness, and I agree with the above.

## 2022-04-06 NOTE — H&P (Signed)
REFERRING PHYSICIAN: Self  PROVIDER: Beverlee Nims, MD  MRN: [id number] DOB: 16-Jul-1946  Subjective   Chief Complaint: Breast Cancer   History of Present Illness: Allison Pierce is a 76 y.o. female who is seen as an office consultation for evaluation of Breast Cancer .   This is a 76 year old female who presented initially with a palpable left breast mass. She reports she fell about a month ago. She has previously had breast augmentation. She underwent mammogram of the left breast showing several areas of concern. There is a 1.8 cm mass at 1 o'clock position and a 1.7 cm mass at the 9 o'clock position of the left breast. There are also more calcification in the breast and a separate location which was not biopsied. Ultrasound the axilla was negative. The masses were biopsied showing invasive lobular carcinoma as well as in situ disease. It was ER/PR negative, HER2 positive, and had a Ki-67 of 20%. She has a family history of breast cancer in both her cousins who died in her 51s. She reports that she believes her father had a mastectomy but was never told why. She has had a previous breast lumpectomy in 2012 for fibrocystic changes with calcifications on the right side. 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 She also admits to drinking at least 5 beers daily. She does not smoke. She has no cardiopulmonary issues  Review of Systems: A complete review of systems was obtained from the patient. I have reviewed this information and discussed as appropriate with the patient. See HPI as well for other ROS.  ROS   Medical History: Past Medical History:  Diagnosis Date   Anxiety   History of cancer   Hyperlipidemia   Hypertension   Patient Active Problem List  Diagnosis   Breast cancer (CMS-HCC)   Hypertension   Malignant neoplasm of upper-outer quadrant of left breast in female, estrogen receptor negative (CMS-HCC)   Mixed hyperlipidemia   Past Surgical History:   Procedure Laterality Date   CATARACT EXTRACTION N/A  5 years ago   HYSTERECTOMY N/A  30 years ago   Neck Surgery N/A  Titanium Plate placed 15 years ago    Allergies  Allergen Reactions   Fluoxetine Other (See Comments)  "COULDN'T LIFT HEAD AND GET OUT OF BED" "COULDN'T LIFT HEAD AND GET OUT OF BED" Severe fatigue   Gadolinium-Containing Contrast Media Itching and Other (See Comments)  Redness and itching, skins feels like it was burning. In the future pt will need pre med.   Atorvastatin Calcium Dizziness   Azelaic Acid Other (See Comments)   Rosuvastatin Other (See Comments)   Pregabalin Anxiety and Other (See Comments)  "FELT WEIRD" "FELT WEIRD"   Current Outpatient Medications on File Prior to Visit  Medication Sig Dispense Refill   donepeziL (ARICEPT) 10 MG tablet Take half tablet (5 mg) daily for 2 weeks, then increase to the full tablet at 10 mg daily   ezetimibe (ZETIA) 10 mg tablet Take 10 mg by mouth once daily   famotidine (PEPCID) 40 MG tablet Take 40 mg by mouth at bedtime   ipratropium (ATROVENT) 0.06 % nasal spray 2 sprays in each nostril   aspirin 81 MG chewable tablet Take by mouth   fluticasone propionate (FLONASE) 50 mcg/actuation nasal spray Place into one nostril   losartan (COZAAR) 25 MG tablet Take 25 mg by mouth once daily   meloxicam (MOBIC) 15 MG tablet 1 tablet   venlafaxine (EFFEXOR-XR) 75 MG XR capsule  1 capsule with food   vitamin B comp and C no.3 15-10-50-5-300 mg Cap 1 tablet   No current facility-administered medications on file prior to visit.   Family History  Problem Relation Age of Onset   Lung cancer Mother   Stroke Father   Skin cancer Father   High blood pressure (Hypertension) Father   Hyperlipidemia (Elevated cholesterol) Father   Coronary Artery Disease (Blocked arteries around heart) Father   Diabetes Father   Prostate cancer Brother    Social History   Tobacco Use  Smoking Status Never  Smokeless Tobacco Never     Social History   Socioeconomic History   Marital status: Married  Tobacco Use   Smoking status: Never   Smokeless tobacco: Never  Vaping Use   Vaping Use: Never used  Substance and Sexual Activity   Alcohol use: Not Currently  Alcohol/week: 5.0 standard drinks  Types: 5 Cans of beer per week  Comment: 5 or 6 a week per Pt   Drug use: Never   Objective:  There were no vitals filed for this visit.  There is no height or weight on file to calculate BMI.  Physical Exam   She appears well on exam and is conversant  She has bilateral breast augmentation. In the left breast, there are 2 small masses palpable 1 at 9:00 and 1 at 1:00. There is some slight axillary adenopathy. There are no palpable masses on the right side.  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 The films and pathology reviewed. I have discussed the diagnosis with the patient and her husband. Given her multifocal disease and lobular diagnosis, bilateral breast MRI is recommended. She will also need a diagnostic mammogram of the right breast. As it is multifocal, a left breast mastectomy and sentinel node biopsy is recommended. She will need removal of the implant at the time of surgery. I discussed the reason for this with her and her husband. she would also need a Port-A-Cath placed for postoperative chemotherapy. Preoperatively, she will need genetic testing as well. She will be referred to plastic surgery so she can discuss immediate reconstruction. She may need further biopsies depending on the results of the MRI as well. Once the testing is complete we can determine when to proceed with surgery. I did explain the risks of surgery with her and her husband in detail. We  discussed the risks of Port-A-Cath insertion as well as well as arm swelling. They understand and agree with the plans.   Addendum: MRI of the right breast was unremarkable.  There was again extensive disease in the left breast including the 2 separate masses and non-mass-like enhancement.  She has seen plastic surgery.  Genetic testing was negative.  We will now proceed with a left mastectomy and sentinel lymph node biopsy as well as Port-A-Cath insertion.  We again discussed the risk of the procedures in detail.  She will also have immediate reconstruction.

## 2022-04-07 ENCOUNTER — Ambulatory Visit (HOSPITAL_COMMUNITY)
Admission: RE | Admit: 2022-04-07 | Discharge: 2022-04-07 | Disposition: A | Discharge status: Home / Self Care | Payer: Medicare Other | Source: Ambulatory Visit | Attending: Surgery | Admitting: Surgery

## 2022-04-07 ENCOUNTER — Other Ambulatory Visit: Payer: Self-pay

## 2022-04-07 ENCOUNTER — Ambulatory Visit (HOSPITAL_COMMUNITY): Payer: Medicare Other

## 2022-04-07 ENCOUNTER — Encounter (HOSPITAL_COMMUNITY)
Admission: RE | Disposition: A | Discharge status: Home / Self Care | Payer: Self-pay | Source: Ambulatory Visit | Attending: Surgery

## 2022-04-07 ENCOUNTER — Ambulatory Visit (HOSPITAL_COMMUNITY): Payer: Medicare Other | Admitting: Anesthesiology

## 2022-04-07 ENCOUNTER — Encounter (HOSPITAL_COMMUNITY): Payer: Self-pay | Admitting: Surgery

## 2022-04-07 ENCOUNTER — Ambulatory Visit (HOSPITAL_BASED_OUTPATIENT_CLINIC_OR_DEPARTMENT_OTHER): Payer: Medicare Other | Admitting: Anesthesiology

## 2022-04-07 DIAGNOSIS — I10 Essential (primary) hypertension: Secondary | ICD-10-CM | POA: Insufficient documentation

## 2022-04-07 DIAGNOSIS — C50912 Malignant neoplasm of unspecified site of left female breast: Secondary | ICD-10-CM | POA: Diagnosis not present

## 2022-04-07 DIAGNOSIS — G8918 Other acute postprocedural pain: Secondary | ICD-10-CM | POA: Diagnosis not present

## 2022-04-07 DIAGNOSIS — Z421 Encounter for breast reconstruction following mastectomy: Secondary | ICD-10-CM

## 2022-04-07 DIAGNOSIS — Z803 Family history of malignant neoplasm of breast: Secondary | ICD-10-CM | POA: Insufficient documentation

## 2022-04-07 DIAGNOSIS — F03A0 Unspecified dementia, mild, without behavioral disturbance, psychotic disturbance, mood disturbance, and anxiety: Secondary | ICD-10-CM | POA: Insufficient documentation

## 2022-04-07 DIAGNOSIS — Z9882 Breast implant status: Secondary | ICD-10-CM | POA: Diagnosis not present

## 2022-04-07 DIAGNOSIS — C773 Secondary and unspecified malignant neoplasm of axilla and upper limb lymph nodes: Secondary | ICD-10-CM | POA: Diagnosis not present

## 2022-04-07 DIAGNOSIS — N641 Fat necrosis of breast: Secondary | ICD-10-CM | POA: Diagnosis not present

## 2022-04-07 DIAGNOSIS — K219 Gastro-esophageal reflux disease without esophagitis: Secondary | ICD-10-CM | POA: Insufficient documentation

## 2022-04-07 DIAGNOSIS — Z171 Estrogen receptor negative status [ER-]: Secondary | ICD-10-CM | POA: Insufficient documentation

## 2022-04-07 DIAGNOSIS — R921 Mammographic calcification found on diagnostic imaging of breast: Secondary | ICD-10-CM | POA: Diagnosis not present

## 2022-04-07 DIAGNOSIS — D242 Benign neoplasm of left breast: Secondary | ICD-10-CM | POA: Diagnosis not present

## 2022-04-07 DIAGNOSIS — N6012 Diffuse cystic mastopathy of left breast: Secondary | ICD-10-CM | POA: Diagnosis not present

## 2022-04-07 HISTORY — PX: PORTACATH PLACEMENT: SHX2246

## 2022-04-07 HISTORY — PX: MASTECTOMY W/ SENTINEL NODE BIOPSY: SHX2001

## 2022-04-07 HISTORY — PX: BREAST RECONSTRUCTION WITH PLACEMENT OF TISSUE EXPANDER AND FLEX HD (ACELLULAR HYDRATED DERMIS): SHX6295

## 2022-04-07 SURGERY — MASTECTOMY WITH SENTINEL LYMPH NODE BIOPSY
Anesthesia: Regional

## 2022-04-07 MED ORDER — ONDANSETRON HCL 4 MG/2ML IJ SOLN: 4.0000 mg | Freq: Once | INTRAMUSCULAR | Status: DC | PRN

## 2022-04-07 MED ORDER — ONDANSETRON HCL 4 MG/2ML IJ SOLN
INTRAMUSCULAR | Status: AC
Start: 2022-04-07 — End: ?
  Filled 2022-04-07: qty 2

## 2022-04-07 MED ORDER — CHLORHEXIDINE GLUCONATE CLOTH 2 % EX PADS: 6.0000 | MEDICATED_PAD | Freq: Once | CUTANEOUS | Status: DC

## 2022-04-07 MED ORDER — HEPARIN SOD (PORK) LOCK FLUSH 100 UNIT/ML IV SOLN
INTRAVENOUS | Status: DC | PRN
  Administered 2022-04-07: 500 [IU]

## 2022-04-07 MED ORDER — ACETAMINOPHEN 500 MG PO TABS: 1000.0000 mg | ORAL_TABLET | ORAL | Status: AC

## 2022-04-07 MED ORDER — LIDOCAINE HCL (PF) 1 % IJ SOLN
INTRAMUSCULAR | Status: AC
  Filled 2022-04-07: qty 30

## 2022-04-07 MED ORDER — HEPARIN 6000 UNIT IRRIGATION SOLUTION
Status: DC | PRN
  Administered 2022-04-07: 1

## 2022-04-07 MED ORDER — FENTANYL CITRATE (PF) 100 MCG/2ML IJ SOLN: 25.0000 ug | INTRAMUSCULAR | Status: DC | PRN

## 2022-04-07 MED ORDER — PROPOFOL 1000 MG/100ML IV EMUL
INTRAVENOUS | Status: AC
  Filled 2022-04-07: qty 100

## 2022-04-07 MED ORDER — FENTANYL CITRATE (PF) 100 MCG/2ML IJ SOLN
INTRAMUSCULAR | Status: AC
  Administered 2022-04-07: 50 ug via INTRAVENOUS
  Filled 2022-04-07: qty 2

## 2022-04-07 MED ORDER — HEPARIN SOD (PORK) LOCK FLUSH 100 UNIT/ML IV SOLN
INTRAVENOUS | Status: AC
  Filled 2022-04-07: qty 5

## 2022-04-07 MED ORDER — METHYLENE BLUE 1 % INJ SOLN
INTRAVENOUS | Status: AC
Start: 2022-04-07 — End: ?
  Filled 2022-04-07: qty 10

## 2022-04-07 MED ORDER — FENTANYL CITRATE (PF) 100 MCG/2ML IJ SOLN
INTRAMUSCULAR | Status: DC | PRN
  Administered 2022-04-07 (×2): 50 ug via INTRAVENOUS
  Administered 2022-04-07: 25 ug via INTRAVENOUS
  Administered 2022-04-07 (×2): 50 ug via INTRAVENOUS
  Administered 2022-04-07: 25 ug via INTRAVENOUS

## 2022-04-07 MED ORDER — ORAL CARE MOUTH RINSE: 15.0000 mL | Freq: Once | OROMUCOSAL | Status: AC

## 2022-04-07 MED ORDER — OXYCODONE HCL 5 MG PO TABS
ORAL_TABLET | ORAL | Status: AC
  Filled 2022-04-07: qty 1

## 2022-04-07 MED ORDER — ACETAMINOPHEN 325 MG PO TABS: 325.0000 mg | ORAL_TABLET | ORAL | Status: DC | PRN

## 2022-04-07 MED ORDER — CHLORHEXIDINE GLUCONATE 0.12 % MT SOLN
OROMUCOSAL | Status: AC
  Administered 2022-04-07: 15 mL via OROMUCOSAL
  Filled 2022-04-07: qty 15

## 2022-04-07 MED ORDER — PROPOFOL 10 MG/ML IV BOLUS
INTRAVENOUS | Status: AC
  Filled 2022-04-07: qty 20

## 2022-04-07 MED ORDER — PHENYLEPHRINE 80 MCG/ML (10ML) SYRINGE FOR IV PUSH (FOR BLOOD PRESSURE SUPPORT)
PREFILLED_SYRINGE | INTRAVENOUS | Status: AC
Start: 2022-04-07 — End: ?
  Filled 2022-04-07: qty 10

## 2022-04-07 MED ORDER — LACTATED RINGERS IV SOLN: INTRAVENOUS | Status: DC

## 2022-04-07 MED ORDER — OXYCODONE HCL 5 MG/5ML PO SOLN: 5.0000 mg | Freq: Once | ORAL | Status: AC | PRN

## 2022-04-07 MED ORDER — ACETAMINOPHEN 160 MG/5ML PO SOLN: 325.0000 mg | ORAL | Status: DC | PRN

## 2022-04-07 MED ORDER — MAGTRACE LYMPHATIC TRACER
INTRAMUSCULAR | Status: DC | PRN
  Administered 2022-04-07: 1 mL via INTRAMUSCULAR

## 2022-04-07 MED ORDER — LIDOCAINE 2% (20 MG/ML) 5 ML SYRINGE
INTRAMUSCULAR | Status: DC | PRN
  Administered 2022-04-07: 60 mg via INTRAVENOUS

## 2022-04-07 MED ORDER — LIDOCAINE 2% (20 MG/ML) 5 ML SYRINGE
INTRAMUSCULAR | Status: AC
  Filled 2022-04-07: qty 5

## 2022-04-07 MED ORDER — PROPOFOL 10 MG/ML IV BOLUS
INTRAVENOUS | Status: DC | PRN
  Administered 2022-04-07: 20 mg via INTRAVENOUS
  Administered 2022-04-07: 50 mg via INTRAVENOUS
  Administered 2022-04-07: 160 mg via INTRAVENOUS

## 2022-04-07 MED ORDER — FENTANYL CITRATE (PF) 100 MCG/2ML IJ SOLN: 50.0000 ug | Freq: Once | INTRAMUSCULAR | Status: AC

## 2022-04-07 MED ORDER — EPHEDRINE 5 MG/ML INJ
INTRAVENOUS | Status: AC
Start: 2022-04-07 — End: ?
  Filled 2022-04-07: qty 5

## 2022-04-07 MED ORDER — MIDAZOLAM HCL 2 MG/2ML IJ SOLN: 2.0000 mg | Freq: Once | INTRAMUSCULAR | Status: AC

## 2022-04-07 MED ORDER — BUPIVACAINE HCL (PF) 0.25 % IJ SOLN
INTRAMUSCULAR | Status: AC
  Filled 2022-04-07: qty 30

## 2022-04-07 MED ORDER — PROPOFOL 500 MG/50ML IV EMUL
INTRAVENOUS | Status: DC | PRN
  Administered 2022-04-07: 120 ug/kg/min via INTRAVENOUS

## 2022-04-07 MED ORDER — MIDAZOLAM HCL 2 MG/2ML IJ SOLN
INTRAMUSCULAR | Status: AC
  Administered 2022-04-07: 2 mg via INTRAVENOUS
  Filled 2022-04-07: qty 2

## 2022-04-07 MED ORDER — CEFAZOLIN SODIUM-DEXTROSE 2-4 GM/100ML-% IV SOLN
2.0000 g | INTRAVENOUS | Status: AC
  Administered 2022-04-07: 2 g via INTRAVENOUS

## 2022-04-07 MED ORDER — BUPIVACAINE-EPINEPHRINE (PF) 0.25% -1:200000 IJ SOLN
INTRAMUSCULAR | Status: AC
  Filled 2022-04-07: qty 30

## 2022-04-07 MED ORDER — ONDANSETRON HCL 4 MG/2ML IJ SOLN
INTRAMUSCULAR | Status: DC | PRN
  Administered 2022-04-07: 4 mg via INTRAVENOUS

## 2022-04-07 MED ORDER — CEFAZOLIN SODIUM-DEXTROSE 2-4 GM/100ML-% IV SOLN
INTRAVENOUS | Status: AC
  Filled 2022-04-07: qty 100

## 2022-04-07 MED ORDER — INDOCYANINE GREEN 25 MG IV SOLR
INTRAVENOUS | Status: DC | PRN
  Administered 2022-04-07: 25 mg via INTRAVENOUS

## 2022-04-07 MED ORDER — DEXAMETHASONE SODIUM PHOSPHATE 10 MG/ML IJ SOLN
INTRAMUSCULAR | Status: AC
  Filled 2022-04-07: qty 1

## 2022-04-07 MED ORDER — PROPOFOL 500 MG/50ML IV EMUL
INTRAVENOUS | Status: AC
  Filled 2022-04-07: qty 50

## 2022-04-07 MED ORDER — HEPARIN 6000 UNIT IRRIGATION SOLUTION
Status: AC
  Filled 2022-04-07: qty 500

## 2022-04-07 MED ORDER — CHLORHEXIDINE GLUCONATE 0.12 % MT SOLN: 15.0000 mL | Freq: Once | OROMUCOSAL | Status: AC

## 2022-04-07 MED ORDER — DEXAMETHASONE SODIUM PHOSPHATE 4 MG/ML IJ SOLN
INTRAMUSCULAR | Status: DC | PRN
  Administered 2022-04-07: 10 mg via INTRAVENOUS

## 2022-04-07 MED ORDER — MEPERIDINE HCL 25 MG/ML IJ SOLN: 6.2500 mg | INTRAMUSCULAR | Status: DC | PRN

## 2022-04-07 MED ORDER — OXYCODONE HCL 5 MG PO TABS
5.0000 mg | ORAL_TABLET | Freq: Once | ORAL | Status: AC | PRN
  Administered 2022-04-07: 5 mg via ORAL

## 2022-04-07 MED ORDER — ACETAMINOPHEN 500 MG PO TABS
ORAL_TABLET | ORAL | Status: AC
  Administered 2022-04-07: 1000 mg via ORAL
  Filled 2022-04-07: qty 2

## 2022-04-07 MED ORDER — FENTANYL CITRATE (PF) 250 MCG/5ML IJ SOLN
INTRAMUSCULAR | Status: AC
  Filled 2022-04-07: qty 5

## 2022-04-07 SURGICAL SUPPLY — 81 items
ADH SKN CLS APL DERMABOND .7 (GAUZE/BANDAGES/DRESSINGS) ×4
ADH SKN CLS LQ APL DERMABOND (GAUZE/BANDAGES/DRESSINGS) ×2
APL PRP STRL LF DISP 70% ISPRP (MISCELLANEOUS) ×6
APL SKNCLS STERI-STRIP NONHPOA (GAUZE/BANDAGES/DRESSINGS) ×2
APPLIER CLIP 9.375 MED OPEN (MISCELLANEOUS) ×3
APR CLP MED 9.3 20 MLT OPN (MISCELLANEOUS) ×2
BAG COUNTER SPONGE SURGICOUNT (BAG) ×4 IMPLANT
BAG DECANTER FOR FLEXI CONT (MISCELLANEOUS) ×6 IMPLANT
BAG SPNG CNTER NS LX DISP (BAG) ×4
BENZOIN TINCTURE PRP APPL 2/3 (GAUZE/BANDAGES/DRESSINGS) ×5 IMPLANT
BINDER BREAST LRG (GAUZE/BANDAGES/DRESSINGS) ×1 IMPLANT
BIOPATCH RED 1 DISK 7.0 (GAUZE/BANDAGES/DRESSINGS) ×4 IMPLANT
BNDG CMPR MED 10X6 ELC LF (GAUZE/BANDAGES/DRESSINGS) ×4
BNDG ELASTIC 6X10 VLCR STRL LF (GAUZE/BANDAGES/DRESSINGS) ×2 IMPLANT
CANISTER SUCT 3000ML PPV (MISCELLANEOUS) ×3 IMPLANT
CHLORAPREP W/TINT 26 (MISCELLANEOUS) ×7 IMPLANT
CLIP APPLIE 9.375 MED OPEN (MISCELLANEOUS) ×2 IMPLANT
CLSR STERI-STRIP ANTIMIC 1/2X4 (GAUZE/BANDAGES/DRESSINGS) ×1 IMPLANT
COVER PROBE W GEL 5X96 (DRAPES) ×3 IMPLANT
COVER TRANSDUCER ULTRASND GEL (DISPOSABLE) ×1 IMPLANT
DERMABOND ADHESIVE PROPEN (GAUZE/BANDAGES/DRESSINGS) ×1
DERMABOND ADVANCED (GAUZE/BANDAGES/DRESSINGS) ×2
DERMABOND ADVANCED .7 DNX12 (GAUZE/BANDAGES/DRESSINGS) ×2 IMPLANT
DERMABOND ADVANCED .7 DNX6 (GAUZE/BANDAGES/DRESSINGS) IMPLANT
DRAIN CHANNEL 15F RND FF W/TCR (WOUND CARE) ×3 IMPLANT
DRAPE C-ARM 42X120 X-RAY (DRAPES) ×3 IMPLANT
DRAPE CHEST BREAST 15X10 FENES (DRAPES) ×3 IMPLANT
DRSG PAD ABDOMINAL 8X10 ST (GAUZE/BANDAGES/DRESSINGS) ×9 IMPLANT
ELECT BLADE 4.0 EZ CLEAN MEGAD (MISCELLANEOUS) ×3
ELECT CAUTERY BLADE 6.4 (BLADE) ×3 IMPLANT
ELECT REM PT RETURN 9FT ADLT (ELECTROSURGICAL) ×6
ELECTRODE BLDE 4.0 EZ CLN MEGD (MISCELLANEOUS) IMPLANT
ELECTRODE REM PT RTRN 9FT ADLT (ELECTROSURGICAL) ×4 IMPLANT
EVACUATOR SILICONE 100CC (DRAIN) ×6 IMPLANT
GAUZE 4X4 16PLY ~~LOC~~+RFID DBL (SPONGE) ×3 IMPLANT
GAUZE SPONGE 2X2 8PLY STRL LF (GAUZE/BANDAGES/DRESSINGS) ×2 IMPLANT
GAUZE SPONGE 4X4 12PLY STRL LF (GAUZE/BANDAGES/DRESSINGS) ×1 IMPLANT
GLOVE BIOGEL M 7.0 STRL (GLOVE) ×3 IMPLANT
GLOVE BIOGEL M STRL SZ7.5 (GLOVE) ×6 IMPLANT
GLOVE BIOGEL PI IND STRL 8 (GLOVE) IMPLANT
GLOVE BIOGEL PI INDICATOR 8 (GLOVE) ×1
GLOVE SURG SIGNA 7.5 PF LTX (GLOVE) ×3 IMPLANT
GOWN STRL REUS W/ TWL LRG LVL3 (GOWN DISPOSABLE) ×6 IMPLANT
GOWN STRL REUS W/ TWL XL LVL3 (GOWN DISPOSABLE) ×2 IMPLANT
GOWN STRL REUS W/TWL LRG LVL3 (GOWN DISPOSABLE) ×15
GOWN STRL REUS W/TWL XL LVL3 (GOWN DISPOSABLE) ×6
GRAFT FLEX HD 19X22X0.7-1.4 (Tissue) ×1 IMPLANT
IMPL BREAST ARTOURA 375CC (Breast) IMPLANT
IMPLANT BREAST ARTOURA 375CC (Breast) ×3 IMPLANT
KIT BASIN OR (CUSTOM PROCEDURE TRAY) ×6 IMPLANT
KIT FILL ASEPTIC TRANSFER (MISCELLANEOUS) ×1 IMPLANT
KIT PORT POWER 8FR ISP CVUE (Port) ×1 IMPLANT
KIT TURNOVER KIT B (KITS) ×3 IMPLANT
NS IRRIG 1000ML POUR BTL (IV SOLUTION) ×3 IMPLANT
PACK GENERAL/GYN (CUSTOM PROCEDURE TRAY) ×6 IMPLANT
PACK SPY-PHI (KITS) ×3 IMPLANT
PAD ARMBOARD 7.5X6 YLW CONV (MISCELLANEOUS) ×3 IMPLANT
PENCIL SMOKE EVACUATOR (MISCELLANEOUS) ×3 IMPLANT
PIN SAFETY STERILE (MISCELLANEOUS) ×3 IMPLANT
POSITIONER HEAD DONUT 9IN (MISCELLANEOUS) ×3 IMPLANT
SLEEVE SCD COMPRESS KNEE MED (STOCKING) ×3 IMPLANT
SPONGE GAUZE 2X2 STER 10/PKG (GAUZE/BANDAGES/DRESSINGS)
SPONGE T-LAP 18X18 ~~LOC~~+RFID (SPONGE) ×5 IMPLANT
STAPLER INSORB 30 2030 C-SECTI (MISCELLANEOUS) ×1 IMPLANT
STRIP CLOSURE SKIN 1/2X4 (GAUZE/BANDAGES/DRESSINGS) ×3 IMPLANT
SUT ETHILON 2 0 FS 18 (SUTURE) ×3 IMPLANT
SUT MNCRL AB 4-0 PS2 18 (SUTURE) ×3 IMPLANT
SUT MON AB 4-0 PC3 18 (SUTURE) ×3 IMPLANT
SUT PDS AB 3-0 SH 27 (SUTURE) ×17 IMPLANT
SUT PROLENE 2 0 SH 30 (SUTURE) ×2 IMPLANT
SUT SILK 2 0 SH (SUTURE) ×3 IMPLANT
SUT VIC AB 3-0 SH 27 (SUTURE) ×3
SUT VIC AB 3-0 SH 27XBRD (SUTURE) ×2 IMPLANT
SYR 50ML LL SCALE MARK (SYRINGE) ×6 IMPLANT
SYR 5ML LUER SLIP (SYRINGE) ×3 IMPLANT
SYR BULB IRRIG 60ML STRL (SYRINGE) ×3 IMPLANT
TOWEL GREEN STERILE (TOWEL DISPOSABLE) ×3 IMPLANT
TOWEL GREEN STERILE FF (TOWEL DISPOSABLE) ×6 IMPLANT
TRACER MAGTRACE VIAL (MISCELLANEOUS) ×1 IMPLANT
TRAY LAPAROSCOPIC MC (CUSTOM PROCEDURE TRAY) ×3 IMPLANT
TUBE CONNECTING 20X1/4 (TUBING) ×3 IMPLANT

## 2022-04-07 NOTE — Op Note (Addendum)
Allison Pierce 04/07/2022   Pre-op Diagnosis: LEFT BREAST CANCER     Post-op Diagnosis: same  Procedure(s): LEFT TOTAL MASTECTOMY AND DEEP LEFT AXILLARY SENTINEL NODE BIOPSY INJECTION OF MAGTRACE FOR LYMPH NODE MAPPING RIGHT INTERNAL JUGULAR PORT-A-CATH INSERTION UNDER ULTRASOUND GUIDANCE   Surgeon:  Coralie Keens, MD  Assistant: Malachi Pro, PA  Anesthesia: General  Staff:  Circulator: Philomena Doheny, RN Radiology Technologist: Peter Minium; Ammie Dalton, RT Relief Circulator: Rozell Searing, RN Scrub Person: Paulette Blanch, RN  Estimated Blood Loss: Minimal               Specimens: sent to pathology  Indications: This is a 76 year old female with multifocal left breast cancer.  She is also HER2 positive.  She has had previously placed breast implants underneath the pectoralis muscle.  She has been seen by medical oncology and plastic surgery.  The plan will be to proceed with a left total mastectomy and sentinel lymph node biopsy with immediate reconstruction and remove the old implant as well as Port-A-Cath insertion  Procedure: The patient is brought to operating identifies the correct patient.  She was placed upon the operating table general anesthesia was induced.   I next injected mag trace with a 25-gauge needle underneath the nipple areolar complex on the left breast and massaged the breast.  Her right chest and neck were then prepped and draped in usual sterile fashion.  With the patient in the Trendelenburg position I made several attempts to cannulate the left subclavian vein without success.  I the made the decision to switch to an internal jugular insertion.  Using the ultrasound, I was able to identify the right internal jugular vein and then use introducer needle to cannulate the vein.  A wire was then placed through the needle and into the central venous system under direct fluoroscopy.  I then made a separate skin incision transversely on  the patient's right upper chest and created a pocket for the port.  I made an incision at the wire introduction site on the neck as well with a scalpel.  An 8 [place] Port-A-Cath was brought to the field.  The catheter and port were flushed.  I was able to use the tunneling device to tunnel from the neck incision down to the chest incision of the clavicle which was difficult given her length.  I then placed the catheter onto the tunneling device and pulled it about of the neck to the chest incision.  The port was then attached to the catheter and placed into the port pocket.  I then fed the introducer sheath and venous dilator over the wire and the neck and into the central venous system.  The wire and dilator were then removed.  I cut the catheter appropriate length and then fed it down the peel-away sheath.  I then accessed the port and good flush and return were demonstrated with heparinized saline.  Fluoroscopy showed that the catheter was then the central superior vena cava.  The port was sewn in place with a Prolene suture and then instilled with concentrated heparin solution.  I then closed the chest incision with interrupted 0 Vicryl sutures and a 4-0 Monocryl suture.  I then closed the neck incision with a 4-0 Monocryl as well.  Dermabond was then applied.  At this point we removed all drapes. The patient's chest and axilla were then prepped and draped in usual sterile fashion.  Dr. Claudia Desanctis had marked the left  breast for the incisions for the mastectomy.  I made an elliptical incision on the left breast incorporating the nipple areolar complex with a scalpel.  I then took this down to the breast tissue with electrocautery.  The patient had very thin breast tissue secondary to her underlying implant.  We created superior and inferior skin flaps with electrocautery staying underneath the skin and dermis removing the breast tissue circumferentially.  At this point became apparent that the overlying pec  muscle had retracted superiorly.  I entered the capsule of the underlying implant medially with the cautery and then remove the implant.  I then started taking the breast off the chest wall including the anterior wall of the capsule of the breast and some of the posterior wall.  Again I was able to dissect medial to lateral move all of the breast tissue in the breast capsule anteriorly.  I marked the lateral margin of the skin with a suture.  The breast was then sent to pathology for evaluation.  Using the mag trace probe I was then able to identify several sentinel lymph nodes in the axilla.  These nodes were hard and firm.  There were excised and sent to pathology for evaluation.  At least 3-4 lymph nodes were removed around the surrounding fat that may contain more firm nodes.  I then repalpated the axilla and found no other enlarged nodes.  We irrigated the axilla with saline.  Hemostasis appeared to be achieved with the cautery and surgical clips.  At this point Dr. Claudia Desanctis then presented to continue his portion of the immediate reconstruction.            Coralie Keens   Date: 04/07/2022  Time: 2:55 PM

## 2022-04-07 NOTE — Discharge Instructions (Addendum)
[facility] [place] [phone]  MASTECTOMY: POST OP INSTRUCTIONS  Always review your discharge instruction sheet given to you by the facility where your surgery was performed. IF YOU HAVE DISABILITY OR FAMILY LEAVE FORMS, YOU MUST BRING THEM TO THE OFFICE FOR PROCESSING.   DO NOT GIVE THEM TO YOUR DOCTOR. A prescription for pain medication may be given to you upon discharge.  Take your pain medication as prescribed, if needed.  If narcotic pain medicine is not needed, then you may take acetaminophen (Tylenol) or ibuprofen (Advil) as needed. Take your usually prescribed medications unless otherwise directed. If you need a refill on your pain medication, please contact your pharmacy.  They will contact our office to request authorization.  Prescriptions will not be filled after 5pm or on week-ends. You should follow a light diet the first few days after arrival home, such as soup and crackers, etc.  Resume your normal diet the day after surgery. Most patients will experience some swelling and bruising on the chest and underarm.  Ice packs will help.  Swelling and bruising can take several days to resolve.  It is common to experience some constipation if taking pain medication after surgery.  Increasing fluid intake and taking a stool softener (such as Colace) will usually help or prevent this problem from occurring.  A mild laxative (Milk of Magnesia or Miralax) should be taken according to package instructions if there are no bowel movements after 48 hours. Unless discharge instructions indicate otherwise, leave your bandage dry and in place until your next appointment in 3-5 days.  You may take a limited sponge bath.  No tube baths or showers until the drains are removed.  You may have steri-strips (small skin tapes) in place directly over the incision.  These strips should be left on the skin for 7-10 days.  If your surgeon used skin glue on the incision, you may shower in 24 hours.   The glue will flake off over the next 2-3 weeks.  Any sutures or staples will be removed at the office during your follow-up visit. DRAINS:  If you have drains in place, it is important to keep a list of the amount of drainage produced each day in your drains.  Before leaving the hospital, you should be instructed on drain care.  Call our office if you have any questions about your drains. ACTIVITIES:  You may resume regular (light) daily activities beginning the next day--such as daily self-care, walking, climbing stairs--gradually increasing activities as tolerated.  You may have sexual intercourse when it is comfortable.  Refrain from any heavy lifting or straining until approved by your doctor. You may drive when you are no longer taking prescription pain medication, you can comfortably wear a seatbelt, and you can safely maneuver your car and apply brakes. RETURN TO WORK:  __________________________________________________________ You should see your doctor in the office for a follow-up appointment approximately 3-5 days after your surgery.  Your doctor's nurse will typically make your follow-up appointment when she calls you with your pathology report.  Expect your pathology report 2-3 business days after your surgery.  You may call to check if you do not hear from us after three days.   OTHER INSTRUCTIONS: ______________________________________________________________________________________________ ____________________________________________________________________________________________ WHEN TO CALL YOUR DOCTOR: Fever over 101.0 Nausea and/or vomiting Extreme swelling or bruising Continued bleeding from incision. Increased pain, redness, or drainage from the incision. The clinic staff is available to answer your questions during regular business hours.  Please don't hesitate   to call and ask to speak to one of the nurses for clinical concerns.  If you have a medical emergency, go to the  nearest emergency room or call 911.  A surgeon from [facility] is always on call at the hospital. [address] [place], [place], [place]  [address] ? [address], [place], [place]   [address] [phone] ? [phone] ? FAX [phone] Web site: [email or web address]     Activity As tolerated. NO showers for 3 days. Keep ACE wrap on breasts until then. After showering, put ACE wrap back on, this is important for compression. NO driving while in pain, taking pain medication or if you are unable to safely react to traffic. No heavy activities Take Pain medication (Oxycodone) as needed for severe pain. Otherwise, you can use ibuprofen or tylenol as needed. Avoid more than 3,000 mg of tylenol in 24 hours.  Start your antibiotic tomorrow AM (04/08/22)  Diet: Regular. Drink plenty of fluids and eat healthy (high protein, low carbs), Try to optimize your nutrition with plenty of fruits and vegetables to improve healing. Protein shakes are a good option.  Wound Care: Keep dressing clean & dry. You may change bandages after showering if you continue to notice some drainage. You can reuse bandages if they are not dirty/soiled. Mild wound drainage is common after breast surgery and should not be cause for alarm. Measure drain output 2 times per day, or more as needed.  Special Instructions: Call Doctor if any unusual problems occur such as pain, excessive Bleeding, unrelieved Nausea/vomiting, Fever &/or chills  Follow-up appointment: Previously scheduled.

## 2022-04-07 NOTE — Interval H&P Note (Signed)
History and Physical Interval Note: no change in H and P  04/07/2022 12:09 PM  Allison Pierce  has presented today for surgery, with the diagnosis of LEFT BREAST CANCER.  The various methods of treatment have been discussed with the patient and family. After consideration of risks, benefits and other options for treatment, the patient has consented to  Procedure(s) with comments: LEFT MASTECTOMY WITH SENTINEL NODE BIOPSY (Left) - LMA PORT-A-CATH INSERTION WITH ULTRASOUND GUIDANCE (N/A) BREAST RECONSTRUCTION WITH PLACEMENT OF TISSUE EXPANDER AND FLEX HD (ACELLULAR HYDRATED DERMIS) (Left) as a surgical intervention.  The patient's history has been reviewed, patient examined, no change in status, stable for surgery.  I have reviewed the patient's chart and labs.  Questions were answered to the patient's satisfaction.     Coralie Keens

## 2022-04-07 NOTE — Anesthesia Preprocedure Evaluation (Signed)
Anesthesia Evaluation  Patient identified by MRN, date of birth, ID band Patient awake    Reviewed: Allergy & Precautions, NPO status , Patient's Chart, lab work & pertinent test results  History of Anesthesia Complications Negative for: history of anesthetic complications  Airway Mallampati: II  TM Distance: >3 FB Neck ROM: Full    Dental  (+) Dental Advisory Given, Partial Lower, Partial Upper,    Pulmonary neg pulmonary ROS,    breath sounds clear to auscultation       Cardiovascular hypertension, Pt. on medications (-) angina(-) Past MI and (-) CHF (-) dysrhythmias  Rhythm:Regular     Neuro/Psych negative neurological ROS     GI/Hepatic Neg liver ROS, GERD  ,  Endo/Other  negative endocrine ROS  Renal/GU negative Renal ROS     Musculoskeletal negative musculoskeletal ROS (+)   Abdominal   Peds  Hematology negative hematology ROS (+)   Anesthesia Other Findings   Reproductive/Obstetrics                             Anesthesia Physical Anesthesia Plan  ASA: 2  Anesthesia Plan: General and Regional   Post-op Pain Management: Regional block*   Induction: Intravenous  PONV Risk Score and Plan: 3 and Ondansetron, Dexamethasone, Propofol infusion, Midazolam and TIVA  Airway Management Planned: LMA  Additional Equipment: None  Intra-op Plan:   Post-operative Plan: Extubation in OR  Informed Consent: I have reviewed the patients History and Physical, chart, labs and discussed the procedure including the risks, benefits and alternatives for the proposed anesthesia with the patient or authorized representative who has indicated his/her understanding and acceptance.     Dental advisory given  Plan Discussed with: CRNA  Anesthesia Plan Comments:         Anesthesia Quick Evaluation

## 2022-04-07 NOTE — Anesthesia Procedure Notes (Signed)
Procedure Name: LMA Insertion Date/Time: 04/07/2022 12:59 PM  Performed by: Ezequiel Kayser, CRNAPre-anesthesia Checklist: Patient identified, Emergency Drugs available, Suction available and Patient being monitored Patient Re-evaluated:Patient Re-evaluated prior to induction Oxygen Delivery Method: Circle System Utilized Preoxygenation: Pre-oxygenation with 100% oxygen Induction Type: IV induction Ventilation: Mask ventilation without difficulty LMA: LMA inserted LMA Size: 4.0 Number of attempts: 1 Airway Equipment and Method: Bite block Placement Confirmation: positive ETCO2 Tube secured with: Tape Dental Injury: Teeth and Oropharynx as per pre-operative assessment

## 2022-04-07 NOTE — Transfer of Care (Signed)
Immediate Anesthesia Transfer of Care Note  Patient: Allison Pierce  Procedure(s) Performed: LEFT MASTECTOMY WITH SENTINEL NODE BIOPSY (Left) PORT-A-CATH INSERTION WITH ULTRASOUND GUIDANCE BREAST RECONSTRUCTION WITH PLACEMENT OF TISSUE EXPANDER AND FLEX HD (ACELLULAR HYDRATED DERMIS) (Left)  Patient Location: PACU  Anesthesia Type:General and Regional  Level of Consciousness: drowsy  Airway & Oxygen Therapy: Patient Spontanous Breathing and Patient connected to face mask oxygen  Post-op Assessment: Report given to RN and Post -op Vital signs reviewed and stable  Post vital signs: Reviewed and stable  Last Vitals:  Vitals Value Taken Time  BP 155/78 04/07/22 1601  Temp 36 C 04/07/22 1556  Pulse 72 04/07/22 1602  Resp 25 04/07/22 1602  SpO2 100 % 04/07/22 1602  Vitals shown include unvalidated device data.  Last Pain:  Vitals:   04/07/22 1038  TempSrc:   PainSc: 3          Complications: No notable events documented.

## 2022-04-07 NOTE — Op Note (Signed)
Operative Note   DATE OF OPERATION: 04/07/2022  SURGICAL DEPARTMENT: Plastic Surgery  PREOPERATIVE DIAGNOSES: Left breast cancer  POSTOPERATIVE DIAGNOSES:  same  PROCEDURE: 1.  Left breast reconstruction with tissue expander and acellular dermal matrix 2.  Indocyanine green angiography left mastectomy flap  SURGEON: Talmadge Coventry, MD  ASSISTANT: Verdie Shire, PA The advanced practice practitioner (APP) assisted throughout the case.  The APP was essential in retraction and counter traction when needed to make the case progress smoothly.  This retraction and assistance made it possible to see the tissue planes for the procedure.  The assistance was needed for hemostasis, tissue re-approximation and closure of the incision site.   ANESTHESIA:  General.   COMPLICATIONS: None.   INDICATIONS FOR PROCEDURE:  The patient, Allison Pierce is a 76 y.o. female born on 01-12-1946, is here for treatment of left breast cancer MRN: [id number]  CONSENT:  Informed consent was obtained directly from the patient. Risks, benefits and alternatives were fully discussed. Specific risks including but not limited to bleeding, infection, hematoma, seroma, scarring, pain, contracture, asymmetry, wound healing problems, and need for further surgery were all discussed. The patient did have an ample opportunity to have questions answered to satisfaction.   DESCRIPTION OF PROCEDURE:  The patient was taken to the operating room. SCDs were placed and antibiotics were given.  General anesthesia was administered.  The patient's operative site was prepped and draped in a sterile fashion. A time out was performed and all information was confirmed to be correct.  Dr. Ninfa Linden performed his portion of the procedure first.  He did a Port-A-Cath, left mastectomy and left sentinel lymph node biopsy.  He then turned the patient over to me.  I started by examining the mastectomy flaps and clinically they looked healthy.   Indocyanine green angiography was then performed demonstrating good perfusion of the flaps with a few small areas right at the incision line that were slow to fill.  I elected to take a few millimeters of skin all the way around the border of the mastectomy incisions and this was sent with the mastectomy specimen.  I then inspected the pocket and ensured meticulous hemostasis.  81 French drain was placed and secured with a nylon suture.  The Flex HD pliable pre-Peace was then brought onto the field.  It was rinsed according to manufacturer's instructions.  A 3-0 PDS was run around the periphery as a pursestring.  The expander was then brought onto the field.  Elected to use [employer] high-profile expander with the prescribed fill volume of 375 cc.  All the air was removed and was placed within the matrix and the pursestring was tied down.  Suture tabs were brought out at 3, 6, 9:00.  3-0 PDS stay sutures were then placed within the chest wall to secure the expander.  I should note that she did have pre-existing implants that were subpectoral.  The implant capsule had been taken as the deep margin of the mastectomy specimen to ensure complete removal of the breast tissue.  I elected to tack down the pectoralis major which had window shaded up superiorly a bit it was tacked down to the chest wall with several figure-of-eight 3-0 PDS sutures.  Pocket was then irrigated with triple antibiotic solution.  Stay sutures were run through the suture tabs and the expander was placed into the wound and the stay sutures tied down.  The tissue expander was then filled with 250 cc of saline.  This put  no tension on the skin closure.  Skin was then closed with interrupted buried Enzor staples and a running 3 OV lock subcuticular.  Benzoin Steri-Strips and a wrap dressing were applied.  The patient tolerated the procedure well.  There were no complications. The patient was allowed to wake from anesthesia, extubated and taken  to the recovery room in satisfactory condition.

## 2022-04-08 ENCOUNTER — Telehealth: Payer: Self-pay | Admitting: Plastic Surgery

## 2022-04-08 ENCOUNTER — Encounter (HOSPITAL_COMMUNITY): Payer: Self-pay | Admitting: Surgery

## 2022-04-08 NOTE — Anesthesia Postprocedure Evaluation (Signed)
Anesthesia Post Note  Patient: Nalleli Largent  Procedure(s) Performed: LEFT MASTECTOMY WITH SENTINEL NODE BIOPSY (Left) PORT-A-CATH INSERTION WITH ULTRASOUND GUIDANCE BREAST RECONSTRUCTION WITH PLACEMENT OF TISSUE EXPANDER AND FLEX HD (ACELLULAR HYDRATED DERMIS) (Left)     Patient location during evaluation: PACU Anesthesia Type: Regional and General Level of consciousness: awake and alert Pain management: pain level controlled Vital Signs Assessment: post-procedure vital signs reviewed and stable Respiratory status: spontaneous breathing, nonlabored ventilation, respiratory function stable and patient connected to nasal cannula oxygen Cardiovascular status: blood pressure returned to baseline and stable Postop Assessment: no apparent nausea or vomiting Anesthetic complications: no   No notable events documented.  Last Vitals:  Vitals:   04/07/22 1630 04/07/22 1641  BP: (!) 153/86 (!) 162/91  Pulse: 70 72  Resp: 16 17  Temp: (!) 36.2 C   SpO2: 92% 92%    Last Pain:  Vitals:   04/07/22 1641  TempSrc:   PainSc: 0-No pain                 Taisia Fantini

## 2022-04-08 NOTE — Telephone Encounter (Signed)
Called patient but not able to leave a message.

## 2022-04-09 ENCOUNTER — Encounter: Payer: Self-pay | Admitting: Plastic Surgery

## 2022-04-10 MED ORDER — BUPIVACAINE-EPINEPHRINE (PF) 0.5% -1:200000 IJ SOLN
INTRAMUSCULAR | Status: DC | PRN
  Administered 2022-04-07: 25 mL via PERINEURAL

## 2022-04-10 MED ORDER — LIDOCAINE HCL (PF) 2 % IJ SOLN
INTRAMUSCULAR | Status: DC | PRN
  Administered 2022-04-07: 100 mg via PERINEURAL

## 2022-04-10 NOTE — Addendum Note (Signed)
Addendum  created 04/10/22 1552 by Oleta Mouse, MD   Child order released for a procedure order, Clinical Note Signed, Intraprocedure Blocks edited, Intraprocedure Meds edited, SmartForm saved

## 2022-04-10 NOTE — Anesthesia Procedure Notes (Signed)
Anesthesia Regional Block: Pectoralis block   Pre-Anesthetic Checklist: , timeout performed,  Correct Patient, Correct Site, Correct Laterality,  Correct Procedure, Correct Position, site marked,  Risks and benefits discussed,  Surgical consent,  Pre-op evaluation,  At surgeon's request and post-op pain management  Laterality: Left  Prep: chloraprep       Needles:  Injection technique: Single-shot      Needle Length: 9cm  Needle Gauge: 22     Additional Needles: Arrow StimuQuik ECHO Echogenic Stimulating PNB Needle  Procedures:,,,, ultrasound used (permanent image in chart),,    Narrative:  Start time: 04/07/2022 12:24 PM End time: 04/07/2022 12:34 PM Injection made incrementally with aspirations every 5 mL.  Performed by: Personally  Anesthesiologist: Oleta Mouse, MD

## 2022-04-11 LAB — SURGICAL PATHOLOGY

## 2022-04-12 ENCOUNTER — Encounter: Payer: Medicare Other | Admitting: Surgical

## 2022-04-13 ENCOUNTER — Encounter: Payer: Medicare Other | Admitting: Plastic Surgery

## 2022-04-13 ENCOUNTER — Ambulatory Visit (INDEPENDENT_AMBULATORY_CARE_PROVIDER_SITE_OTHER): Payer: Medicare Other | Admitting: Surgical

## 2022-04-13 ENCOUNTER — Encounter: Payer: Self-pay | Admitting: *Deleted

## 2022-04-13 DIAGNOSIS — C50412 Malignant neoplasm of upper-outer quadrant of left female breast: Secondary | ICD-10-CM

## 2022-04-13 DIAGNOSIS — Z171 Estrogen receptor negative status [ER-]: Secondary | ICD-10-CM

## 2022-04-13 MED ORDER — SULFAMETHOXAZOLE-TRIMETHOPRIM 800-160 MG PO TABS: 1.0000 | ORAL_TABLET | Freq: Two times a day (BID) | ORAL | 0 refills | Status: AC

## 2022-04-13 NOTE — Progress Notes (Signed)
Patient is a 76 year old female here for follow-up after immediate left breast reconstruction placement tissue expanders and Flex HD by Dr. Claudia Desanctis after left total mastectomy and deep left axillary sentinel lymph node biopsy by Dr. Ninfa Linden on 04/07/2022.  Patient had a [place] high-profile expander placed.  250 cc of saline was placed within the expander intraoperatively.  She presents today with her husband.  She reports overall she is doing really well.  She is 1 week postop.  She reports JP drain has been working well, reports minimal tenderness in this area.  She is not having any infectious symptoms.  She does report that she will require radiation, she is not sure of the timeline at this point.  Chaperone present on exam On exam left breast incision is intact, Steri-Strips are in place.  She does have some mild bruising of the left breast, otherwise mastectomy flaps are viable, no necrosis noted.  No subcutaneous fluid collection noted.  Serous drainage noted in the left JP drain bulb.  No cellulitic changes or erythema noted.   We placed injectable saline in the Expander using a sterile technique: Left: 60 cc for a total of 310 / 375 cc  We discussed expanding her left breast fairly quickly due to the need for radiation postoperatively.  We did discuss that once radiation mapping began we would not be able to do any additional expansions to avoid changing the mapping.  Patient was understanding of this.  We can coordinate with oncology.  We discussed scheduling a follow-up with me in 1 week for reevaluation and additional fill.  I do not see any signs of infection on exam.  Patient tolerated expansion of today, recommend calling with questions or concerns.

## 2022-04-18 ENCOUNTER — Encounter: Payer: Self-pay | Admitting: *Deleted

## 2022-04-18 ENCOUNTER — Inpatient Hospital Stay: Payer: Medicare Other | Attending: Hematology and Oncology | Admitting: Hematology and Oncology

## 2022-04-18 ENCOUNTER — Other Ambulatory Visit: Payer: Self-pay

## 2022-04-18 VITALS — BP 133/70 | HR 89 | Temp 97.7°F | Resp 18 | Ht 65.0 in | Wt 155.9 lb

## 2022-04-18 DIAGNOSIS — Z171 Estrogen receptor negative status [ER-]: Secondary | ICD-10-CM | POA: Diagnosis not present

## 2022-04-18 DIAGNOSIS — C50412 Malignant neoplasm of upper-outer quadrant of left female breast: Secondary | ICD-10-CM | POA: Insufficient documentation

## 2022-04-18 NOTE — Assessment & Plan Note (Signed)
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 I discussed the margins as well as lymph node surgeries. We also discussed the final staging along with previously performed ER/PR and HER-2/neu testing.  Treatment plan: 1.  ALND: Discussed the pros and cons of axillary lymph node dissection 2. adjuvant chemotherapy with TCHP x6 followed by [name] Perjeta maintenance 3.  Adjuvant radiation  Return to clinic to start chemotherapy

## 2022-04-19 ENCOUNTER — Encounter: Payer: Self-pay | Admitting: *Deleted

## 2022-04-21 ENCOUNTER — Ambulatory Visit (INDEPENDENT_AMBULATORY_CARE_PROVIDER_SITE_OTHER): Payer: Medicare Other | Admitting: Physician Assistant

## 2022-04-21 DIAGNOSIS — C50412 Malignant neoplasm of upper-outer quadrant of left female breast: Secondary | ICD-10-CM

## 2022-04-21 DIAGNOSIS — Z171 Estrogen receptor negative status [ER-]: Secondary | ICD-10-CM

## 2022-04-21 NOTE — Progress Notes (Signed)
This is a pleasant 76 year old female here for follow-up evaluation status post left breast reconstruction placement of tissue expanders and Flex HD by Dr. Claudia Desanctis after left total mastectomy and deep left axillary sentinel lymph node biopsy by Dr. Ninfa Linden on 04/07/2022.  The patient had a [place] high-profile expander placed.  250 cc of saline was placed within the expander intraoperatively.  She was last seen in our office on 04/13/2022.  At that time she had been doing very well without significant complaints or concerns.  She did have injectable saline placed in the expander totaling 60 cc for a total of 310/375 cc.   On exam today the left breast incision is clean dry and intact, Steri-Strips are in place, minimal bruising to the left breast, mastectomy flaps are viable, no necrosis noted, rest were palpated with no obvious fluid collection.  Serous drainage noted in the left JP bulb.    At today's visit she notes she is doing well.  She has had minimal output out of her JP drain.  This was removed at her visit.  We also placed injectable saline in the expander using a sterile technique, 65 cc for a total of 375/375 cc.  The patient will follow-up in 1 week for reevaluation, she was given strict return precautions, she verbalized understanding and agreement to today's plan had no further questions or concerns.

## 2022-04-24 ENCOUNTER — Encounter (HOSPITAL_COMMUNITY)
Admission: RE | Admit: 2022-04-24 | Discharge: 2022-04-24 | Disposition: A | Discharge status: Home / Self Care | Payer: Medicare Other | Source: Ambulatory Visit | Attending: Hematology and Oncology | Admitting: Hematology and Oncology

## 2022-04-24 DIAGNOSIS — Z171 Estrogen receptor negative status [ER-]: Secondary | ICD-10-CM | POA: Diagnosis not present

## 2022-04-24 DIAGNOSIS — M19072 Primary osteoarthritis, left ankle and foot: Secondary | ICD-10-CM | POA: Diagnosis not present

## 2022-04-24 DIAGNOSIS — C50412 Malignant neoplasm of upper-outer quadrant of left female breast: Secondary | ICD-10-CM | POA: Insufficient documentation

## 2022-04-24 DIAGNOSIS — M19011 Primary osteoarthritis, right shoulder: Secondary | ICD-10-CM | POA: Diagnosis not present

## 2022-04-24 DIAGNOSIS — D0502 Lobular carcinoma in situ of left breast: Secondary | ICD-10-CM | POA: Diagnosis not present

## 2022-04-24 DIAGNOSIS — M19012 Primary osteoarthritis, left shoulder: Secondary | ICD-10-CM | POA: Diagnosis not present

## 2022-04-24 MED ORDER — TECHNETIUM TC 99M MEDRONATE IV KIT
20.0000 | PACK | Freq: Once | INTRAVENOUS | Status: AC | PRN
  Administered 2022-04-24: 20 via INTRAVENOUS

## 2022-04-25 ENCOUNTER — Encounter: Payer: Self-pay | Admitting: *Deleted

## 2022-04-26 ENCOUNTER — Ambulatory Visit (HOSPITAL_COMMUNITY)
Admission: RE | Admit: 2022-04-26 | Discharge: 2022-04-26 | Disposition: A | Discharge status: Home / Self Care | Payer: Medicare Other | Source: Ambulatory Visit | Attending: Hematology and Oncology | Admitting: Hematology and Oncology

## 2022-04-26 DIAGNOSIS — C50912 Malignant neoplasm of unspecified site of left female breast: Secondary | ICD-10-CM | POA: Diagnosis not present

## 2022-04-26 DIAGNOSIS — C50412 Malignant neoplasm of upper-outer quadrant of left female breast: Secondary | ICD-10-CM | POA: Diagnosis not present

## 2022-04-26 DIAGNOSIS — K769 Liver disease, unspecified: Secondary | ICD-10-CM | POA: Diagnosis not present

## 2022-04-26 DIAGNOSIS — Z171 Estrogen receptor negative status [ER-]: Secondary | ICD-10-CM | POA: Diagnosis not present

## 2022-04-26 DIAGNOSIS — K5730 Diverticulosis of large intestine without perforation or abscess without bleeding: Secondary | ICD-10-CM | POA: Diagnosis not present

## 2022-04-26 MED ORDER — IOHEXOL 300 MG/ML  SOLN
100.0000 mL | Freq: Once | INTRAMUSCULAR | Status: AC | PRN
  Administered 2022-04-26: 100 mL via INTRAVENOUS

## 2022-04-26 MED ORDER — SODIUM CHLORIDE (PF) 0.9 % IJ SOLN
INTRAMUSCULAR | Status: AC
  Filled 2022-04-26: qty 50

## 2022-04-26 NOTE — Progress Notes (Signed)
HEMATOLOGY-ONCOLOGY TELEPHONE VISIT PROGRESS NOTE  I connected with our patient on 04/27/22 at  2:00 PM EDT by telephone and verified that I am speaking with the correct person using two identifiers.  I discussed the limitations, risks, security and privacy concerns of performing an evaluation and management service by telephone and the availability of in person appointments.  I also discussed with the patient that there may be a patient responsible charge related to this service. The patient expressed understanding and agreed to proceed.   History of Present Illness: Allison Pierce is a 76 y.o. female is of recent diagnosis of left breast cancer. She presents to the clinic today for via telephone follow-up.  She had CT chest abdomen pelvis and bone scan is here today to discuss results of the tests.  Oncology History  Malignant neoplasm of upper-outer quadrant of left breast in female, estrogen receptor negative ([place])  02/10/2022 Initial Diagnosis   Palpable left breast masses and calcifications: 1.8 cm at 1:00 and 1.7 cm at 9:00, calcifications not biopsy.  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 Report date is 03/05/2022.  The CustomNext-Cancer+RNAinsight panel offered by [facility] includes sequencing and rearrangement analysis for the following 48 genes:  APC, ATM, AXIN2, BARD1, BMPR1A, BRCA1, BRCA2, BRIP1, CDH1, CDK4, CDKN2A, CHEK2, CTNNA1, DICER1, EGFR, EPCAM, GREM1, HOXB13, KIT, MEN1, MLH1, MSH2, MSH3, MSH6, MUTYH, NBN, NF1, NTHL1, PALB2, PDGFRA, PMS2, POLD1, POLE, PTEN, RAD50, RAD51C,  RAD51D, SDHA, SDHB, SDHC, SDHD, SMAD4, SMARCA4, STK11, TP53, TSC1, TSC2, and VHL.  RNA data is routinely analyzed for use in variant interpretation for all genes.   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, LCIS, 5/5 lymph nodes positive with extranodal extension, margins negative ER 0%, PR 0%, HER2 3+, Ki-67 20%     REVIEW OF SYSTEMS:   Constitutional: Denies fevers, chills or abnormal weight loss All other systems were reviewed with the patient and are negative.  Observations/Objective:     Assessment Plan:  Malignant neoplasm of upper-outer quadrant of left breast in female, estrogen receptor negative ([place]) 04/07/2022:Left mastectomy: 4.2 cm invasive pleomorphic lobular carcinoma grade 2, LCIS, 5/5 lymph nodes positive with extranodal extension, margins negative, ER 0%, PR 0%, HER2 3+, Ki-67 20%  CT CAP 04/26/2022: Lytic osseous lesions throughout body, right seventh rib, multiple thoracic vertebral bodies, sacrum.  T4 vertebral body Bone scan 04/25/2022: Abnormal uptake in the sternum, right parietal region of calvarium, right posterior seventh rib, T5, left sternoclavicular joint  Treatment plan: Palliative chemotherapy with Taxotere [name] and Perjeta every 3 weeks No role of adjuvant radiation since she has metastatic breast cancer. ----------------------------------------------------------------------------------------------------------------------------------------------------- Radiology review: Based upon the evidence of lytic  osseous lesions and her sternum, thoracic spine, seventh rib, sternoclavicular junction, we did not recommend definite adjuvant chemotherapy but would recommend palliative chemotherapy with Taxotere [name] and Perjeta.  Chemo counseling: Discussed the risks and benefits of chemotherapy with Taxotere including hair loss, nausea, fatigue, cytopenias, LFT changes and neuropathy.  Risks of [name] include  decreased cardiac ejection fraction and risk of Perjeta includes diarrhea were also  discussed.  Treatment plan: Echocardiogram has been done in May 2023: EF 65% Chemo class Start treatment in 1 week      I discussed the assessment and treatment plan with the patient. The patient was provided an opportunity to ask questions and all were answered. The patient agreed with the plan and demonstrated an understanding of the instructions. The patient was advised to call back or seek an in-person evaluation if the symptoms worsen or if the condition fails to improve as anticipated.   I provided 22 minutes of non-face-to-face time during this encounter.  This includes time for charting and coordination of care   Harriette Ohara, MD   I Gardiner Coins am scribing for Dr. Lindi Adie  I have reviewed the above documentation for accuracy and completeness, and I agree with the above.

## 2022-04-27 ENCOUNTER — Inpatient Hospital Stay (HOSPITAL_BASED_OUTPATIENT_CLINIC_OR_DEPARTMENT_OTHER): Payer: Medicare Other | Admitting: Hematology and Oncology

## 2022-04-27 DIAGNOSIS — C50412 Malignant neoplasm of upper-outer quadrant of left female breast: Secondary | ICD-10-CM | POA: Diagnosis not present

## 2022-04-27 DIAGNOSIS — Z171 Estrogen receptor negative status [ER-]: Secondary | ICD-10-CM

## 2022-04-27 MED ORDER — ONDANSETRON HCL 8 MG PO TABS: 8.0000 mg | ORAL_TABLET | Freq: Two times a day (BID) | ORAL | 1 refills | Status: DC | PRN

## 2022-04-27 MED ORDER — LIDOCAINE-PRILOCAINE 2.5-2.5 % EX CREA: TOPICAL_CREAM | CUTANEOUS | 3 refills | Status: DC

## 2022-04-27 MED ORDER — PROCHLORPERAZINE MALEATE 10 MG PO TABS: 10.0000 mg | ORAL_TABLET | Freq: Four times a day (QID) | ORAL | 1 refills | Status: DC | PRN

## 2022-04-27 MED ORDER — DEXAMETHASONE 4 MG PO TABS: 4.0000 mg | ORAL_TABLET | Freq: Every day | ORAL | 0 refills | Status: DC

## 2022-04-27 NOTE — Assessment & Plan Note (Addendum)
04/07/2022:Left mastectomy: 4.2 cm invasive pleomorphic lobular carcinoma grade 2, LCIS, 5/5 lymph nodes positive with extranodal extension, margins negative, ER 0%, PR 0%, HER2 3+, Ki-67 20%  CT CAP 04/26/2022: Lytic osseous lesions throughout body, right seventh rib, multiple thoracic vertebral bodies, sacrum.  T4 vertebral body Bone scan 04/25/2022: Abnormal uptake in the sternum, right parietal region of calvarium, right posterior seventh rib, T5, left sternoclavicular joint  Treatment plan: Palliative chemotherapy with Taxotere [name] and Perjeta every 3 weeks No role of adjuvant radiation since she has metastatic breast cancer. ----------------------------------------------------------------------------------------------------------------------------------------------------- Radiology review: Based upon the evidence of lytic  osseous lesions and her sternum, thoracic spine, seventh rib, sternoclavicular junction, we did not recommend definite adjuvant chemotherapy but would recommend palliative chemotherapy with Taxotere [name] and Perjeta.  Chemo counseling: Discussed the risks and benefits of chemotherapy with Taxotere including hair loss, nausea, fatigue, cytopenias, LFT changes and neuropathy.  Risks of [name] include decreased cardiac ejection fraction and risk of Perjeta includes diarrhea were also discussed.  Treatment plan: Echocardiogram has been done in May 2023: EF 65% Chemo class Start treatment in 1 week

## 2022-04-27 NOTE — Progress Notes (Signed)
START ON PATHWAY REGIMEN - Breast ° ° °  Cycle 1: A cycle is 21 days: °    Pertuzumab  °    Trastuzumab-xxxx  °    Docetaxel  °  Cycles 2 and beyond: A cycle is every 21 days: °    Pertuzumab  °    Trastuzumab-xxxx  °    Docetaxel  ° °**Always confirm dose/schedule in your pharmacy ordering system** ° °Patient Characteristics: °Distant Metastases or Locoregional Recurrent Disease - Unresected or Locally Advanced Unresectable Disease Progressing after Neoadjuvant and Local Therapies, HER2 Positive, ER Negative/Unknown, Chemotherapy, First Line °Therapeutic Status: Distant Metastases °HER2 Status: Positive (+) °ER Status: Negative (-) °PR Status: Negative (-) °Line of Therapy: First Line °Intent of Therapy: °Non-Curative / Palliative Intent, Discussed with Patient °

## 2022-04-28 ENCOUNTER — Ambulatory Visit (INDEPENDENT_AMBULATORY_CARE_PROVIDER_SITE_OTHER): Payer: Medicare Other | Admitting: Physician Assistant

## 2022-04-28 ENCOUNTER — Other Ambulatory Visit: Payer: Self-pay

## 2022-04-28 ENCOUNTER — Encounter: Payer: Self-pay | Admitting: *Deleted

## 2022-04-28 DIAGNOSIS — Z171 Estrogen receptor negative status [ER-]: Secondary | ICD-10-CM

## 2022-04-28 DIAGNOSIS — C50412 Malignant neoplasm of upper-outer quadrant of left female breast: Secondary | ICD-10-CM

## 2022-04-28 NOTE — Progress Notes (Signed)
Patient Care Team: [name], MD as PCP - General (Family Medicine) Cameron Sprang, MD as Consulting Physician (Neurology) Mauro Kaufmann, RN as Oncology Nurse Navigator Rockwell Germany, RN as Oncology Nurse Navigator Coralie Keens, MD as Consulting Physician (General Surgery) Nicholas Lose, MD as Consulting Physician (Hematology and Oncology) Eppie Gibson, MD as Attending Physician (Radiation Oncology)  DIAGNOSIS:  Encounter Diagnosis  Name Primary?   Malignant neoplasm of upper-outer quadrant of left breast in female, estrogen receptor negative ([place])     SUMMARY OF ONCOLOGIC HISTORY: Oncology History  Malignant neoplasm of upper-outer quadrant of left breast in female, estrogen receptor negative ([place])  02/10/2022 Initial Diagnosis   Palpable left breast masses and calcifications: 1.8 cm at 1:00 and 1.7 cm at 9:00, calcifications not biopsy.  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 Report date is 03/05/2022.  The CustomNext-Cancer+RNAinsight panel offered by [facility] includes sequencing and rearrangement analysis for the following 48 genes:  APC, ATM, AXIN2, BARD1, BMPR1A, BRCA1, BRCA2, BRIP1, CDH1, CDK4, CDKN2A, CHEK2, CTNNA1, DICER1, EGFR, EPCAM, GREM1, HOXB13, KIT, MEN1, MLH1, MSH2, MSH3, MSH6, MUTYH, NBN, NF1, NTHL1, PALB2, PDGFRA, PMS2, POLD1, POLE, PTEN, RAD50, RAD51C, RAD51D, SDHA, SDHB, SDHC, SDHD, SMAD4, SMARCA4, STK11, TP53, TSC1, TSC2, and VHL.  RNA data is routinely analyzed for use in variant interpretation for all genes.   04/07/2022 Surgery   Left mastectomy:  4.2 cm invasive pleomorphic lobular carcinoma grade 2, LCIS, 5/5 lymph nodes positive with extranodal extension, margins negative ER 0%, PR 0%, HER2 3+, Ki-67 20%   04/27/2022 Cancer Staging   Staging form: Breast, AJCC 8th Edition - Pathologic: Stage IIIA (pT2, pN2, cM0, G2, ER-, PR-, HER2+) - Signed by Nicholas Lose, MD on 04/27/2022 Histologic grading system: 3 grade system   05/05/2022 -  Chemotherapy   Patient is on Treatment Plan : BREAST DOCEtaxel + Trastuzumab + Pertuzumab (THP) q21d x 8 cycles / Trastuzumab + Pertuzumab q21d x 4 cycles       CHIEF COMPLIANT: First treatment Taxotere herceptin perjeta  INTERVAL HISTORY: Allison Pierce is a 76 y.o female with the above-mentioned. She presents to the clinic today to start her first treatment. She states that she fell when she got up.   ALLERGIES:  is allergic to gadolinium derivatives, atorvastatin calcium, azelaic acid, fluoxetine, oxybutynin, prozac [fluoxetine hcl], ritalin [methylphenidate], rosuvastatin, and lyrica [pregabalin].  MEDICATIONS:  Current Outpatient Medications  Medication Sig Dispense Refill   B Complex-C (B COMPLEX-VITAMIN C) CAPS Take 1 capsule by mouth daily.     buPROPion (WELLBUTRIN XL) 300 MG 24 hr tablet Take 300 mg by mouth every morning.     calcium carbonate (TUMS - DOSED IN MG ELEMENTAL CALCIUM) 500 MG chewable tablet Chew 1 tablet by mouth daily as needed for indigestion or heartburn.     cholecalciferol (VITAMIN D3) 25 MCG (1000 UNIT) tablet Take 1,000 Units by mouth daily.     clonazePAM (KLONOPIN) 1 MG tablet Take 1 mg by mouth at bedtime.     dexamethasone (DECADRON) 4 MG tablet Take 1 tablet (4 mg total) by mouth daily.  Take 1 tablet day before chemo and 1 tablet day after chemo with food 20 tablet 0   diphenhydrAMINE (BENADRYL) 25 MG tablet Take 25 mg by mouth in the morning and at bedtime.     fluticasone (FLONASE) 50 MCG/ACT nasal spray Place 1 spray into both nostrils daily as needed for  allergies.     lidocaine-prilocaine (EMLA) cream Apply to affected area once 30 g 3   losartan (COZAAR) 25 MG tablet Take 50 mg by mouth daily.     meloxicam (MOBIC) 15 MG tablet Take 15 mg by mouth daily.     ondansetron (ZOFRAN) 8 MG tablet Take 1 tablet (8 mg total) by mouth 2 (two) times daily as needed for refractory nausea / vomiting. 30 tablet 1   Potassium 95 MG TABS Take 95 mg by mouth daily.     prochlorperazine (COMPAZINE) 10 MG tablet Take 1 tablet (10 mg total) by mouth every 6 (six) hours as needed (Nausea or vomiting). 30 tablet 1   traZODone (DESYREL) 150 MG tablet Take 225 mg by mouth at bedtime.     venlafaxine XR (EFFEXOR-XR) 75 MG 24 hr capsule Take 75 mg by mouth daily with breakfast.     vitamin B-12 (CYANOCOBALAMIN) 500 MCG tablet Take 500 mcg by mouth daily.     No current facility-administered medications for this visit.   Facility-Administered Medications Ordered in Other Visits  Medication Dose Route Frequency Provider Last Rate Last Admin   heparin lock flush 100 unit/mL  500 Units Intracatheter Once PRN Nicholas Lose, MD       ondansetron (ZOFRAN) 8 mg in sodium chloride 0.9 % 50 mL IVPB  8 mg Intravenous Once Nicholas Lose, MD 216 mL/hr at 05/05/22 1103 8 mg at 05/05/22 1103   sodium chloride flush (NS) 0.9 % injection 10 mL  10 mL Intracatheter PRN Nicholas Lose, MD        PHYSICAL EXAMINATION: ECOG PERFORMANCE STATUS: 1 - Symptomatic but completely ambulatory  Vitals:   05/05/22 0937  BP: (!) 177/71  Pulse: 80  Resp: 18  Temp: 98.1 F (36.7 C)  SpO2: 100%   Filed Weights   05/05/22 0937  Weight: 158 lb 12.8 oz (72 kg)     LABORATORY DATA:  I have reviewed the data as listed    Latest Ref Rng & Units 03/29/2022    1:32 PM 02/22/2022    8:26 AM 09/16/2020    6:23 AM  CMP  Glucose 70 - 99 mg/dL 99  89  110   BUN 8 - 23 mg/dL '7  14  8   ' Creatinine 0.44 - 1.00 mg/dL 0.99  1.12  0.83   Sodium 135 - 145 mmol/L 134  136  136   Potassium 3.5 - 5.1  mmol/L 3.9  3.7  3.5   Chloride 98 - 111 mmol/L 98  103  102   CO2 22 - 32 mmol/L '27  27  24   ' Calcium 8.9 - 10.3 mg/dL 9.6  9.2  8.6   Total Protein 6.5 - 8.1 g/dL  6.7  6.0   Total Bilirubin 0.3 - 1.2 mg/dL  0.5  0.4   Alkaline Phos 38 - 126 U/L  91  62   AST 15 - 41 U/L  17  24   ALT 0 - 44 U/L  12  20     Lab Results  Component Value Date   WBC 10.4 05/05/2022   HGB 11.7 (L) 05/05/2022  HCT 34.2 (L) 05/05/2022   MCV 83.8 05/05/2022   PLT 298 05/05/2022   NEUTROABS 7.7 05/05/2022    ASSESSMENT & PLAN:  Malignant neoplasm of upper-outer quadrant of left breast in female, estrogen receptor negative ([place]) 04/07/2022:Left mastectomy: 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  T4 vertebral body Bone scan 04/25/2022: Abnormal uptake in the sternum, right parietal region of calvarium, right posterior seventh rib, T5, left sternoclavicular joint   Treatment plan: Palliative chemotherapy with Taxotere [name] and Perjeta every 3 weeks No role of adjuvant radiation since she has metastatic breast cancer. ----------------------------------------------------------------------------------------------------------------------------------------------------- Radiology review: Based upon the evidence of lytic  osseous lesions and her sternum, thoracic spine, seventh rib, sternoclavicular junction, we did not recommend definite adjuvant chemotherapy but would recommend palliative chemotherapy with Taxotere [name] and Perjeta.   Current treatment: Cycle 1 day 1 Taxotere [name] and Perjeta Echocardiogram 03/01/2022: EF 60 to 65% Labs reviewed, chemo education completed, chemo consent obtained, antiemetics were reviewed.  Return to clinic in 1 week for  toxicity evaluation.    No orders of the defined types were placed in this encounter.  The patient has a good understanding of the overall plan. she agrees with it. she will call with any problems that may develop before the next visit here. Total time spent: 30 mins including face to face time and time spent for planning, charting and co-ordination of care   Harriette Ohara, MD 05/05/22    I Gardiner Coins am scribing for Dr. Lindi Adie  I have reviewed the above documentation for accuracy and completeness, and I agree with the above.

## 2022-04-28 NOTE — Progress Notes (Signed)
  This is a pleasant 76 year old female here for follow-up evaluation status post left breast reconstruction placement of tissue expanders and Flex HD by Dr. Claudia Desanctis after left total mastectomy and deep left axillary sentinel lymph node biopsy by Dr. Ninfa Linden on 04/07/2022.  The patient had a [place] high-profile expander placed.  250 cc of saline was placed within the expander intraoperatively.  She was last seen in our office on 04/21/2022.  At that time she had been doing very well without significant complaints or concerns.  She did have injectable saline placed in the expander totaling 375/375 cc.   On exam today the left breast incision is clean dry and intact, Steri-Strips are in place, minimal bruising to the left breast, mastectomy flaps are viable, no necrosis noted, no obvious fluid collections upon palpation.   I did discuss our options for surgeons, Dr. Erin Hearing is in the office and did have the opportunity to come talk to the patient.  He did recommend a another fill today, placed 50 cc of saline into the left expanders, she tolerated this well.  We would like to see the patient back in our office in 2 weeks to assess whether she can tolerate another fill or not.  Dr. Erin Hearing did recommend that she would likely do better cosmetically if we replace the right implant is well.  The patient is happy with this plan.  The patient will return to office in 2 weeks or sooner as needed, she was given strict return precautions.  Both her and her husband verbalized understanding and agreement to today's plan had no further questions or concerns.

## 2022-04-29 ENCOUNTER — Other Ambulatory Visit: Payer: Self-pay

## 2022-05-01 ENCOUNTER — Telehealth: Payer: Self-pay | Admitting: Hematology and Oncology

## 2022-05-01 ENCOUNTER — Other Ambulatory Visit: Payer: Self-pay

## 2022-05-01 NOTE — Progress Notes (Signed)
Pharmacist Chemotherapy Monitoring - Initial Assessment    Anticipated start date: 05/05/22   The following has been reviewed per standard work regarding the patient's treatment regimen: The patient's diagnosis, treatment plan and drug doses, and organ/hematologic function Lab orders and baseline tests specific to treatment regimen  The treatment plan start date, drug sequencing, and pre-medications Prior authorization status  Patient's documented medication list, including drug-drug interaction screen and prescriptions for anti-emetics and supportive care specific to the treatment regimen The drug concentrations, fluid compatibility, administration routes, and timing of the medications to be used The patient's access for treatment and lifetime cumulative dose history, if applicable  The patient's medication allergies and previous infusion related reactions, if applicable   Changes made to treatment plan:  N/A  Follow up needed:  N/A   Kennith Center, Pharm.D., CPP 05/01/2022'@2'$ :27 PM

## 2022-05-01 NOTE — Telephone Encounter (Signed)
Contacted patient to scheduled appointments. Left message with appointment details and a call back number if patient had any questions or could not accommodate the time we provided.   

## 2022-05-02 ENCOUNTER — Encounter: Payer: Medicare Other | Admitting: Psychology

## 2022-05-03 ENCOUNTER — Telehealth: Payer: Self-pay | Admitting: Hematology and Oncology

## 2022-05-03 ENCOUNTER — Encounter: Payer: Medicare Other | Admitting: Plastic Surgery

## 2022-05-03 ENCOUNTER — Inpatient Hospital Stay: Payer: Medicare Other | Attending: Hematology and Oncology

## 2022-05-03 DIAGNOSIS — Z9012 Acquired absence of left breast and nipple: Secondary | ICD-10-CM | POA: Insufficient documentation

## 2022-05-03 DIAGNOSIS — Z803 Family history of malignant neoplasm of breast: Secondary | ICD-10-CM | POA: Insufficient documentation

## 2022-05-03 DIAGNOSIS — Z171 Estrogen receptor negative status [ER-]: Secondary | ICD-10-CM | POA: Insufficient documentation

## 2022-05-03 DIAGNOSIS — Z5112 Encounter for antineoplastic immunotherapy: Secondary | ICD-10-CM | POA: Insufficient documentation

## 2022-05-03 DIAGNOSIS — C50412 Malignant neoplasm of upper-outer quadrant of left female breast: Secondary | ICD-10-CM | POA: Insufficient documentation

## 2022-05-03 DIAGNOSIS — K5900 Constipation, unspecified: Secondary | ICD-10-CM | POA: Insufficient documentation

## 2022-05-03 DIAGNOSIS — E860 Dehydration: Secondary | ICD-10-CM | POA: Insufficient documentation

## 2022-05-03 DIAGNOSIS — Z801 Family history of malignant neoplasm of trachea, bronchus and lung: Secondary | ICD-10-CM | POA: Insufficient documentation

## 2022-05-03 NOTE — Telephone Encounter (Signed)
Cancelled appointment per provider PAL. Talked with Dr.Gudena and he stated that he was okay (per 8/2 secure chat) with the patient not having an MD visit D1C2.Patient is scheduled to have an MD visit on 8/4 as well as 8/11. Called patient and left a voicemail regarding this change.

## 2022-05-04 ENCOUNTER — Other Ambulatory Visit: Payer: Self-pay

## 2022-05-04 MED FILL — Dexamethasone Sodium Phosphate Inj 100 MG/10ML: INTRAMUSCULAR | Qty: 1 | Status: AC

## 2022-05-04 NOTE — Assessment & Plan Note (Signed)
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  T4 vertebral body Bone scan 04/25/2022: Abnormal uptake in the sternum, right parietal region of calvarium, right posterior seventh rib, T5, left sternoclavicular joint  Treatment plan: Palliative chemotherapy with Taxotere [name] and Perjeta every 3 weeks No role of adjuvant radiation since she has metastatic breast cancer. ----------------------------------------------------------------------------------------------------------------------------------------------------- Radiology review: Based upon the evidence of lytic  osseous lesions and her sternum, thoracic spine, seventh rib, sternoclavicular junction, we did not recommend definite adjuvant chemotherapy but would recommend palliative chemotherapy with Taxotere [name] and Perjeta.  Current treatment: Cycle 1 day 1 Taxotere [name] and Perjeta Echocardiogram 03/01/2022: EF 60 to 65% Labs reviewed, chemo education completed, chemo consent obtained, antiemetics were reviewed.  Return to clinic in 1 week for toxicity evaluation.

## 2022-05-05 ENCOUNTER — Other Ambulatory Visit: Payer: Self-pay

## 2022-05-05 ENCOUNTER — Inpatient Hospital Stay: Payer: Medicare Other

## 2022-05-05 ENCOUNTER — Encounter: Payer: Self-pay | Admitting: *Deleted

## 2022-05-05 ENCOUNTER — Inpatient Hospital Stay (HOSPITAL_BASED_OUTPATIENT_CLINIC_OR_DEPARTMENT_OTHER): Payer: Medicare Other | Admitting: Hematology and Oncology

## 2022-05-05 VITALS — BP 165/82 | HR 66 | Temp 98.0°F | Resp 18

## 2022-05-05 DIAGNOSIS — Z803 Family history of malignant neoplasm of breast: Secondary | ICD-10-CM | POA: Diagnosis not present

## 2022-05-05 DIAGNOSIS — Z171 Estrogen receptor negative status [ER-]: Secondary | ICD-10-CM

## 2022-05-05 DIAGNOSIS — Z95828 Presence of other vascular implants and grafts: Secondary | ICD-10-CM

## 2022-05-05 DIAGNOSIS — C50412 Malignant neoplasm of upper-outer quadrant of left female breast: Secondary | ICD-10-CM | POA: Diagnosis not present

## 2022-05-05 DIAGNOSIS — Z801 Family history of malignant neoplasm of trachea, bronchus and lung: Secondary | ICD-10-CM | POA: Diagnosis not present

## 2022-05-05 DIAGNOSIS — E860 Dehydration: Secondary | ICD-10-CM | POA: Diagnosis not present

## 2022-05-05 DIAGNOSIS — Z5112 Encounter for antineoplastic immunotherapy: Secondary | ICD-10-CM | POA: Diagnosis not present

## 2022-05-05 DIAGNOSIS — Z9012 Acquired absence of left breast and nipple: Secondary | ICD-10-CM | POA: Diagnosis not present

## 2022-05-05 DIAGNOSIS — K5900 Constipation, unspecified: Secondary | ICD-10-CM | POA: Diagnosis not present

## 2022-05-05 LAB — CBC WITH DIFFERENTIAL (CANCER CENTER ONLY)
Abs Immature Granulocytes: 0.04 10*3/uL (ref 0.00–0.07)
Basophils Absolute: 0.1 10*3/uL (ref 0.0–0.1)
Basophils Relative: 1 %
Eosinophils Absolute: 0.1 10*3/uL (ref 0.0–0.5)
Eosinophils Relative: 1 %
HCT: 34.2 % — ABNORMAL LOW (ref 36.0–46.0)
Hemoglobin: 11.7 g/dL — ABNORMAL LOW (ref 12.0–15.0)
Immature Granulocytes: 0 %
Lymphocytes Relative: 14 %
Lymphs Abs: 1.4 10*3/uL (ref 0.7–4.0)
MCH: 28.7 pg (ref 26.0–34.0)
MCHC: 34.2 g/dL (ref 30.0–36.0)
MCV: 83.8 fL (ref 80.0–100.0)
Monocytes Absolute: 1.1 10*3/uL — ABNORMAL HIGH (ref 0.1–1.0)
Monocytes Relative: 11 %
Neutro Abs: 7.7 10*3/uL (ref 1.7–7.7)
Neutrophils Relative %: 73 %
Platelet Count: 298 10*3/uL (ref 150–400)
RBC: 4.08 MIL/uL (ref 3.87–5.11)
RDW: 12.4 % (ref 11.5–15.5)
WBC Count: 10.4 10*3/uL (ref 4.0–10.5)
nRBC: 0 % (ref 0.0–0.2)

## 2022-05-05 LAB — CMP (CANCER CENTER ONLY)
ALT: 9 U/L (ref 0–44)
AST: 16 U/L (ref 15–41)
Albumin: 4.3 g/dL (ref 3.5–5.0)
Alkaline Phosphatase: 87 U/L (ref 38–126)
Anion gap: 6 (ref 5–15)
BUN: 15 mg/dL (ref 8–23)
CO2: 27 mmol/L (ref 22–32)
Calcium: 9.2 mg/dL (ref 8.9–10.3)
Chloride: 101 mmol/L (ref 98–111)
Creatinine: 1 mg/dL (ref 0.44–1.00)
GFR, Estimated: 58 mL/min — ABNORMAL LOW (ref >60)
Glucose, Bld: 97 mg/dL (ref 70–99)
Potassium: 4 mmol/L (ref 3.5–5.1)
Sodium: 134 mmol/L — ABNORMAL LOW (ref 135–145)
Total Bilirubin: 0.5 mg/dL (ref 0.3–1.2)
Total Protein: 6.7 g/dL (ref 6.5–8.1)

## 2022-05-05 MED ORDER — ACETAMINOPHEN 325 MG PO TABS
650.0000 mg | ORAL_TABLET | Freq: Once | ORAL | Status: AC
  Administered 2022-05-05: 650 mg via ORAL
  Filled 2022-05-05: qty 2

## 2022-05-05 MED ORDER — SODIUM CHLORIDE 0.9 % IV SOLN: Freq: Once | INTRAVENOUS | Status: AC

## 2022-05-05 MED ORDER — SODIUM CHLORIDE 0.9% FLUSH
10.0000 mL | Freq: Once | INTRAVENOUS | Status: AC
  Administered 2022-05-05: 10 mL

## 2022-05-05 MED ORDER — TRASTUZUMAB-DKST CHEMO 150 MG IV SOLR
8.0000 mg/kg | Freq: Once | INTRAVENOUS | Status: AC
  Administered 2022-05-05: 567 mg via INTRAVENOUS
  Filled 2022-05-05: qty 27

## 2022-05-05 MED ORDER — SODIUM CHLORIDE 0.9 % IV SOLN
75.0000 mg/m2 | Freq: Once | INTRAVENOUS | Status: AC
  Administered 2022-05-05: 140 mg via INTRAVENOUS
  Filled 2022-05-05: qty 14

## 2022-05-05 MED ORDER — SODIUM CHLORIDE 0.9% FLUSH
10.0000 mL | INTRAVENOUS | Status: DC | PRN
  Administered 2022-05-05: 10 mL

## 2022-05-05 MED ORDER — SODIUM CHLORIDE 0.9 % IV SOLN
8.0000 mg | Freq: Once | INTRAVENOUS | Status: AC
  Administered 2022-05-05: 8 mg via INTRAVENOUS
  Filled 2022-05-05: qty 4

## 2022-05-05 MED ORDER — SODIUM CHLORIDE 0.9 % IV SOLN
10.0000 mg | Freq: Once | INTRAVENOUS | Status: AC
  Administered 2022-05-05: 10 mg via INTRAVENOUS
  Filled 2022-05-05: qty 10

## 2022-05-05 MED ORDER — HEPARIN SOD (PORK) LOCK FLUSH 100 UNIT/ML IV SOLN
500.0000 [IU] | Freq: Once | INTRAVENOUS | Status: AC | PRN
  Administered 2022-05-05: 500 [IU]

## 2022-05-05 MED ORDER — SODIUM CHLORIDE 0.9 % IV SOLN
840.0000 mg | Freq: Once | INTRAVENOUS | Status: AC
  Administered 2022-05-05: 840 mg via INTRAVENOUS
  Filled 2022-05-05: qty 28

## 2022-05-05 MED ORDER — DIPHENHYDRAMINE HCL 25 MG PO CAPS
25.0000 mg | ORAL_CAPSULE | Freq: Once | ORAL | Status: AC
  Administered 2022-05-05: 25 mg via ORAL
  Filled 2022-05-05: qty 1

## 2022-05-05 NOTE — Patient Instructions (Signed)
[facility]  Discharge Instructions: Thank you for choosing [facility] to provide your oncology and hematology care.   If you have a lab appointment with the [facility], please go directly to the [facility] and check in at the registration area.   Wear comfortable clothing and clothing appropriate for easy access to any Portacath or PICC line.   We strive to give you quality time with your provider. You may need to reschedule your appointment if you arrive late (15 or more minutes).  Arriving late affects you and other patients whose appointments are after yours.  Also, if you miss three or more appointments without notifying the office, you may be dismissed from the clinic at the provider's discretion.      For prescription refill requests, have your pharmacy contact our office and allow 72 hours for refills to be completed.    Today you received the following chemotherapy and/or immunotherapy agents; Docetaxel (Taxotere), Pertuzumab (Perjeta), Trastuzumab-dkst (Ogivri)      To help prevent nausea and vomiting after your treatment, we encourage you to take your nausea medication as directed.  BELOW ARE SYMPTOMS THAT SHOULD BE REPORTED IMMEDIATELY: *FEVER GREATER THAN 100.4 F (38 C) OR HIGHER *CHILLS OR SWEATING *NAUSEA AND VOMITING THAT IS NOT CONTROLLED WITH YOUR NAUSEA MEDICATION *UNUSUAL SHORTNESS OF BREATH *UNUSUAL BRUISING OR BLEEDING *URINARY PROBLEMS (pain or burning when urinating, or frequent urination) *BOWEL PROBLEMS (unusual diarrhea, constipation, pain near the anus) TENDERNESS IN MOUTH AND THROAT WITH OR WITHOUT PRESENCE OF ULCERS (sore throat, sores in mouth, or a toothache) UNUSUAL RASH, SWELLING OR PAIN  UNUSUAL VAGINAL DISCHARGE OR ITCHING   Items with * indicate a potential emergency and should be followed up as soon as possible or go to the Emergency Department if any problems should occur.  Please show the  CHEMOTHERAPY ALERT CARD or IMMUNOTHERAPY ALERT CARD at check-in to the Emergency Department and triage nurse.  Should you have questions after your visit or need to cancel or reschedule your appointment, please contact [facility]  Dept: [phone]  and follow the prompts.  Office hours are 8:00 a.m. to 4:30 p.m. Monday - Friday. Please note that voicemails left after 4:00 p.m. may not be returned until the following business day.  We are closed weekends and major holidays. You have access to a nurse at all times for urgent questions. Please call the main number to the clinic Dept: [phone] and follow the prompts.   For any non-urgent questions, you may also contact your provider using MyChart. We now offer e-Visits for anyone 27 and older to request care online for non-urgent symptoms. For details visit [email or web address].   Also download the MyChart app! Go to the app store, search "MyChart", open the app, select [facility], and log in with your MyChart username and password.  Masks are optional in the cancer centers. If you would like for your care team to wear a mask while they are taking care of you, please let them know. You may have one support Allison Pierce who is at least 77 years old accompany you for your appointments.

## 2022-05-05 NOTE — Progress Notes (Signed)
Per MD okay to proceed with pre medications today while waiting for CMP results.

## 2022-05-07 DIAGNOSIS — R531 Weakness: Secondary | ICD-10-CM | POA: Diagnosis not present

## 2022-05-07 DIAGNOSIS — R55 Syncope and collapse: Secondary | ICD-10-CM | POA: Diagnosis not present

## 2022-05-08 ENCOUNTER — Inpatient Hospital Stay: Payer: Medicare Other

## 2022-05-09 ENCOUNTER — Encounter: Payer: Medicare Other | Admitting: Psychology

## 2022-05-09 ENCOUNTER — Other Ambulatory Visit: Payer: Self-pay | Admitting: *Deleted

## 2022-05-09 ENCOUNTER — Telehealth: Payer: Self-pay | Admitting: Hematology and Oncology

## 2022-05-09 ENCOUNTER — Other Ambulatory Visit: Payer: Self-pay

## 2022-05-09 ENCOUNTER — Inpatient Hospital Stay: Payer: Medicare Other

## 2022-05-09 ENCOUNTER — Inpatient Hospital Stay (HOSPITAL_BASED_OUTPATIENT_CLINIC_OR_DEPARTMENT_OTHER): Payer: Medicare Other | Admitting: Adult Health

## 2022-05-09 VITALS — BP 124/67 | HR 103 | Temp 98.8°F | Resp 18 | Ht 65.0 in | Wt 156.1 lb

## 2022-05-09 VITALS — BP 152/72 | HR 90 | Temp 97.7°F | Resp 16

## 2022-05-09 DIAGNOSIS — Z95828 Presence of other vascular implants and grafts: Secondary | ICD-10-CM

## 2022-05-09 DIAGNOSIS — Z171 Estrogen receptor negative status [ER-]: Secondary | ICD-10-CM

## 2022-05-09 DIAGNOSIS — C50212 Malignant neoplasm of upper-inner quadrant of left female breast: Secondary | ICD-10-CM

## 2022-05-09 DIAGNOSIS — C50412 Malignant neoplasm of upper-outer quadrant of left female breast: Secondary | ICD-10-CM | POA: Diagnosis not present

## 2022-05-09 DIAGNOSIS — Z5112 Encounter for antineoplastic immunotherapy: Secondary | ICD-10-CM | POA: Diagnosis not present

## 2022-05-09 DIAGNOSIS — Z9012 Acquired absence of left breast and nipple: Secondary | ICD-10-CM | POA: Diagnosis not present

## 2022-05-09 DIAGNOSIS — E860 Dehydration: Secondary | ICD-10-CM | POA: Diagnosis not present

## 2022-05-09 DIAGNOSIS — K5900 Constipation, unspecified: Secondary | ICD-10-CM | POA: Diagnosis not present

## 2022-05-09 LAB — CMP (CANCER CENTER ONLY)
ALT: 33 U/L (ref 0–44)
AST: 30 U/L (ref 15–41)
Albumin: 3.8 g/dL (ref 3.5–5.0)
Alkaline Phosphatase: 74 U/L (ref 38–126)
Anion gap: 4 — ABNORMAL LOW (ref 5–15)
BUN: 13 mg/dL (ref 8–23)
CO2: 27 mmol/L (ref 22–32)
Calcium: 8.6 mg/dL — ABNORMAL LOW (ref 8.9–10.3)
Chloride: 103 mmol/L (ref 98–111)
Creatinine: 0.81 mg/dL (ref 0.44–1.00)
GFR, Estimated: >60 mL/min (ref >60)
Glucose, Bld: 113 mg/dL — ABNORMAL HIGH (ref 70–99)
Potassium: 4.2 mmol/L (ref 3.5–5.1)
Sodium: 134 mmol/L — ABNORMAL LOW (ref 135–145)
Total Bilirubin: 0.7 mg/dL (ref 0.3–1.2)
Total Protein: 6.2 g/dL — ABNORMAL LOW (ref 6.5–8.1)

## 2022-05-09 LAB — CBC WITH DIFFERENTIAL (CANCER CENTER ONLY)
Abs Immature Granulocytes: 0.01 10*3/uL (ref 0.00–0.07)
Basophils Absolute: 0 10*3/uL (ref 0.0–0.1)
Basophils Relative: 1 %
Eosinophils Absolute: 0.1 10*3/uL (ref 0.0–0.5)
Eosinophils Relative: 2 %
HCT: 33.3 % — ABNORMAL LOW (ref 36.0–46.0)
Hemoglobin: 11.3 g/dL — ABNORMAL LOW (ref 12.0–15.0)
Immature Granulocytes: 0 %
Lymphocytes Relative: 9 %
Lymphs Abs: 0.3 10*3/uL — ABNORMAL LOW (ref 0.7–4.0)
MCH: 28.5 pg (ref 26.0–34.0)
MCHC: 33.9 g/dL (ref 30.0–36.0)
MCV: 84.1 fL (ref 80.0–100.0)
Monocytes Absolute: 0.1 10*3/uL (ref 0.1–1.0)
Monocytes Relative: 2 %
Neutro Abs: 3.1 10*3/uL (ref 1.7–7.7)
Neutrophils Relative %: 86 %
Platelet Count: 227 10*3/uL (ref 150–400)
RBC: 3.96 MIL/uL (ref 3.87–5.11)
RDW: 12 % (ref 11.5–15.5)
WBC Count: 3.6 10*3/uL — ABNORMAL LOW (ref 4.0–10.5)
nRBC: 0 % (ref 0.0–0.2)

## 2022-05-09 LAB — MAGNESIUM: Magnesium: 1.8 mg/dL (ref 1.7–2.4)

## 2022-05-09 LAB — SAMPLE TO BLOOD BANK

## 2022-05-09 MED ORDER — HEPARIN SOD (PORK) LOCK FLUSH 100 UNIT/ML IV SOLN
500.0000 [IU] | Freq: Once | INTRAVENOUS | Status: AC
  Administered 2022-05-09: 500 [IU]

## 2022-05-09 MED ORDER — SODIUM CHLORIDE 0.9% FLUSH
10.0000 mL | Freq: Once | INTRAVENOUS | Status: AC
  Administered 2022-05-09: 10 mL

## 2022-05-09 MED ORDER — SODIUM CHLORIDE 0.9 % IV SOLN: Freq: Once | INTRAVENOUS | Status: AC

## 2022-05-09 NOTE — Patient Instructions (Signed)
Dehydration, Adult Dehydration is condition in which there is not enough water or other fluids in the body. This happens when a person loses more fluids than he or she takes in. Important body parts cannot work right without the right amount of fluids. Any loss of fluids from the body can cause dehydration. Dehydration can be mild, worse, or very bad. It should be treated right away to keep it from getting very bad. What are the causes? This condition may be caused by: Conditions that cause loss of water or other fluids, such as: Watery poop (diarrhea). Vomiting. Sweating a lot. Peeing (urinating) a lot. Not drinking enough fluids, especially when you: Are ill. Are doing things that take a lot of energy to do. Other illnesses and conditions, such as fever or infection. Certain medicines, such as medicines that take extra fluid out of the body (diuretics). Lack of safe drinking water. Not being able to get enough water and food. What increases the risk? The following factors may make you more likely to develop this condition: Having a long-term (chronic) illness that has not been treated the right way, such as: Diabetes. Heart disease. Kidney disease. Being 65 years of age or older. Having a disability. Living in a place that is high above the ground or sea (high in altitude). The thinner, dried air causes more fluid loss. Doing exercises that put stress on your body for a long time. What are the signs or symptoms? Symptoms of dehydration depend on how bad it is. Mild or worse dehydration Thirst. Dry lips or dry mouth. Feeling dizzy or light-headed, especially when you stand up from sitting. Muscle cramps. Your body making: Dark pee (urine). Pee may be the color of tea. Less pee than normal. Less tears than normal. Headache. Very bad dehydration Changes in skin. Skin may: Be cold to the touch (clammy). Be blotchy or pale. Not go back to normal right after you lightly pinch  it and let it go. Little or no tears, pee, or sweat. Changes in vital signs, such as: Fast breathing. Low blood pressure. Weak pulse. Pulse that is more than 100 beats a minute when you are sitting still. Other changes, such as: Feeling very thirsty. Eyes that look hollow (sunken). Cold hands and feet. Being mixed up (confused). Being very tired (lethargic) or having trouble waking from sleep. Short-term weight loss. Loss of consciousness. How is this treated? Treatment for this condition depends on how bad it is. Treatment should start right away. Do not wait until your condition gets very bad. Very bad dehydration is an emergency. You will need to go to a hospital. Mild or worse dehydration can be treated at home. You may be asked to: Drink more fluids. Drink an oral rehydration solution (ORS). This drink helps get the right amounts of fluids and salts and minerals in the blood (electrolytes). Very bad dehydration can be treated: With fluids through an IV tube. By getting normal levels of salts and minerals in your blood. This is often done by giving salts and minerals through a tube. The tube is passed through your nose and into your stomach. By treating the root cause. Follow these instructions at home: Oral rehydration solution If told by your doctor, drink an ORS: Make an ORS. Use instructions on the package. Start by drinking small amounts, about  cup (120 mL) every 5-10 minutes. Slowly drink more until you have had the amount that your doctor said to have. Eating and drinking          Drink enough clear fluid to keep your pee pale yellow. If you were told to drink an ORS, finish the ORS first. Then, start slowly drinking other clear fluids. Drink fluids such as: Water. Do not drink only water. Doing that can make the salt (sodium) level in your body get too low. Water from ice chips you suck on. Fruit juice that you have added water to (diluted). Low-calorie sports  drinks. Eat foods that have the right amounts of salts and minerals, such as: Bananas. Oranges. Potatoes. Tomatoes. Spinach. Do not drink alcohol. Avoid: Drinks that have a lot of sugar. These include: High-calorie sports drinks. Fruit juice that you did not add water to. Soda. Caffeine. Foods that are greasy or have a lot of fat or sugar. General instructions Take over-the-counter and prescription medicines only as told by your doctor. Do not take salt tablets. Doing that can make the salt level in your body get too high. Return to your normal activities as told by your doctor. Ask your doctor what activities are safe for you. Keep all follow-up visits as told by your doctor. This is important. Contact a doctor if: You have pain in your belly (abdomen) and the pain: Gets worse. Stays in one place. You have a rash. You have a stiff neck. You get angry or annoyed (irritable) more easily than normal. You are more tired or have a harder time waking than normal. You feel: Weak or dizzy. Very thirsty. Get help right away if you have: Any symptoms of very bad dehydration. Symptoms of vomiting, such as: You cannot eat or drink without vomiting. Your vomiting gets worse or does not go away. Your vomit has blood or green stuff in it. Symptoms that get worse with treatment. A fever. A very bad headache. Problems with peeing or pooping (having a bowel movement), such as: Watery poop that gets worse or does not go away. Blood in your poop (stool). This may cause poop to look black and tarry. Not peeing in 6-8 hours. Peeing only a small amount of very dark pee in 6-8 hours. Trouble breathing. These symptoms may be an emergency. Do not wait to see if the symptoms will go away. Get medical help right away. Call your local emergency services (911 in the [place] Do not drive yourself to the hospital. Summary Dehydration is a condition in which there is not enough water or other fluids  in the body. This happens when a person loses more fluids than he or she takes in. Treatment for this condition depends on how bad it is. Treatment should be started right away. Do not wait until your condition gets very bad. Drink enough clear fluid to keep your pee pale yellow. If you were told to drink an oral rehydration solution (ORS), finish the ORS first. Then, start slowly drinking other clear fluids. Take over-the-counter and prescription medicines only as told by your doctor. Get help right away if you have any symptoms of very bad dehydration. This information is not intended to replace advice given to you by your health care provider. Make sure you discuss any questions you have with your health care provider. Document Revised: 01/25/2022 Document Reviewed: 05/01/2019 Elsevier Patient Education  2023 Elsevier Inc.  

## 2022-05-09 NOTE — Telephone Encounter (Signed)
Patient's husband called me to inform me that she had 2 episodes of seizures at home.  These episodes lasted for 1 to 2 minutes and she had a postictal state of severe fatigue but then she was able to recover gaining consciousness and she was able to get back into the bed.  The first episode they called EMS and the decision was made not to bring her to the hospital.  He did not call EMS for the second episode.  She had 1 episode of diarrhea as well.  She feels extremely tired and wiped out.  She is able to eat and drink. Apparently she had seizures before. I discussed with her husband that it would be ideal for her to come in and be evaluated.  He will let us know around noon time whether she is able to come.

## 2022-05-10 ENCOUNTER — Ambulatory Visit: Payer: Medicare Other | Admitting: Hematology and Oncology

## 2022-05-10 ENCOUNTER — Encounter: Payer: Self-pay | Admitting: Adult Health

## 2022-05-10 ENCOUNTER — Encounter: Payer: Self-pay | Admitting: Hematology and Oncology

## 2022-05-10 NOTE — Progress Notes (Signed)
[facility] Follow up:    Allison Neer, MD [address] [place] [address]   DIAGNOSIS:  Cancer Staging  Malignant neoplasm of upper-outer quadrant of left breast in female, estrogen receptor negative ([facility]) Staging form: Breast, AJCC 8th Edition - Clinical: Stage IA (cT1c, cN0, cM0, G2, ER-, PR-, HER2+) - Signed by Nicholas Lose, MD on 02/22/2022 Stage prefix: Initial diagnosis Histologic grading system: 3 grade system - Pathologic: Stage IIIA (pT2, pN2, cM0, G2, ER-, PR-, HER2+) - Signed by Nicholas Lose, MD on 04/27/2022 Histologic grading system: 3 grade system   SUMMARY OF ONCOLOGIC HISTORY: Oncology History  Malignant neoplasm of upper-outer quadrant of left breast in female, estrogen receptor negative ([facility])  02/10/2022 Initial Diagnosis   Palpable left breast masses and calcifications: 1.8 cm at 1:00 and 1.7 cm at 9:00, calcifications not biopsy.  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 Report date is 03/05/2022.  The CustomNext-Cancer+RNAinsight panel offered by [facility] includes sequencing and rearrangement analysis for the following 48 genes:  APC, ATM, AXIN2, BARD1, BMPR1A, BRCA1, BRCA2, BRIP1, CDH1, CDK4, CDKN2A, CHEK2, CTNNA1, DICER1, EGFR, EPCAM, GREM1, HOXB13, KIT, MEN1, MLH1, MSH2, MSH3, MSH6, MUTYH, NBN, NF1, NTHL1, PALB2, PDGFRA, PMS2, POLD1, POLE, PTEN, RAD50, RAD51C, RAD51D, SDHA, SDHB, SDHC, SDHD, SMAD4, SMARCA4, STK11, TP53, TSC1, TSC2, and VHL.  RNA data is routinely analyzed for use in variant interpretation for all genes.    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, LCIS, 5/5 lymph nodes positive with extranodal extension, margins negative ER 0%, PR 0%, HER2 3+, Ki-67 20%   04/27/2022 Cancer Staging   Staging form: Breast, AJCC 8th Edition - Pathologic: Stage IIIA (pT2, pN2, cM0, G2, ER-, PR-, HER2+) - Signed by Nicholas Lose, MD on 04/27/2022 Histologic grading system: 3 grade system   05/05/2022 -  Chemotherapy   Patient is on Treatment Plan : BREAST DOCEtaxel + Trastuzumab + Pertuzumab (THP) q21d x 8 cycles / Trastuzumab + Pertuzumab q21d x 4 cycles       CURRENT THERAPY: THP   INTERVAL HISTORY: Melady Atoya Pierce 76 y.o. female returns for f/u after receiving her first cycle of Taxotere/[name] and Perjeta.  She is here for an urgent evaluation after her husband saiud she had what appeared to him as two seizures.  The first episode happened on Sunday evenenign whien she was in the shower.  She was standing and turned the water very hot and then lost consciousness.  She woke up a minute or two later.    The second episode happened about 12 hours later when she had gotten up to go to the bathroom and had a large bowel movement, and then felt too weak to go back to the bed and lost consciousness while she was sitting for 2 minutes.  She did not have any preceding feeling, chest pain or palpitaitons.    She hd two large bowel movements since receiving the chemotherapy, and has been weaker since receiving it.  She denies any fever or chills.  She has not had  any palpitations, or other concerns.     Patient Active Problem List   Diagnosis Date Noted   Port-A-Cath in place 05/05/2022   Genetic testing 03/06/2022   Family history of breast cancer 02/23/2022   Family history of prostate cancer 02/23/2022   Malignant neoplasm of upper-outer quadrant of left breast in female, estrogen receptor negative ([facility]) 02/20/2022   Malignant neoplasm of upper-inner  quadrant of left female breast ([facility]) 02/20/2022   Right-sided epistaxis 07/19/2017   Anxiety state 01/24/2013   Arthropathy 01/24/2013   Depressive disorder, not elsewhere classified 01/24/2013   Other and unspecified hyperlipidemia 01/24/2013    is allergic to gadolinium derivatives, atorvastatin calcium, azelaic acid, fluoxetine, oxybutynin, prozac [fluoxetine hcl], ritalin [methylphenidate], rosuvastatin, and lyrica [pregabalin].  MEDICAL HISTORY: Past Medical History:  Diagnosis Date   Anxiety    Depression    GERD (gastroesophageal reflux disease) OCCASIONALLY  TAKE TUMS   Human papilloma virus    Hyperlipidemia    Hypertension    UTI (urinary tract infection)    Vulvar lesion     SURGICAL HISTORY: Past Surgical History:  Procedure Laterality Date   ABDOMINAL HYSTERECTOMY  1992   W/ SALPINGO-OOPHORECTOMY   ANTERIOR CERVICAL DECOMP/DISCECTOMY FUSION  01-18-2009  DR POOLE   C4  - C6   AUGMENTATION MAMMAPLASTY Bilateral    BREAST BIOPSY Right    BREAST BIOPSY Left 02/10/2022   x2   BREAST ENHANCEMENT SURGERY  2011   BREAST EXCISIONAL BIOPSY Left    BREAST RECONSTRUCTION WITH PLACEMENT OF TISSUE EXPANDER AND FLEX HD (ACELLULAR HYDRATED DERMIS) Left 04/07/2022   Procedure: BREAST RECONSTRUCTION WITH PLACEMENT OF TISSUE EXPANDER AND FLEX HD (ACELLULAR HYDRATED DERMIS);  Surgeon: [name], MD;  Location: [facility];  Service: Plastics;  Laterality: Left;   BREAST SURGERY  02-04-2011  dr Margot Chimes   EXCISION LEFT BREAST MASS--  CALCIFICATION   EYE SURGERY Bilateral    cataract removal   MASTECTOMY W/ SENTINEL NODE BIOPSY Left 04/07/2022   Procedure: LEFT MASTECTOMY WITH SENTINEL NODE BIOPSY;  Surgeon: Coralie Keens, MD;  Location: [facility];  Service: General;  Laterality: Left;  LMA   PORTACATH PLACEMENT N/A 04/07/2022   Procedure: PORT-A-CATH INSERTION WITH ULTRASOUND GUIDANCE;  Surgeon: Coralie Keens, MD;  Location: [facility];  Service: General;  Laterality: N/A;   VULVAR  LESION REMOVAL  09/10/2012   Procedure: VULVAR LESION;  Surgeon: Selinda Orion, MD;  Location[facility];  Service: Gynecology;  Laterality: N/A;  WIDE EXCISION OF VULVAR LESION    SOCIAL HISTORY: Social History   Socioeconomic History   Marital status: Married    Spouse name: [name]   Number of children: 1   Years of education: 12   Highest education level: Not on file  Occupational History   Not on file  Tobacco Use   Smoking status: Never   Smokeless tobacco: Never  Vaping Use   Vaping Use: Never used  Substance and Sexual Activity   Alcohol use: Yes    Alcohol/week: 7.0 standard drinks of alcohol    Types: 7 Cans of beer per week   Drug use: Never   Sexual activity: Not Currently  Other Topics Concern   Not on file  Social History Narrative   Graduated HS      Right handed      Lives with husband   Social Determinants of Health   Financial Resource Strain: Low Risk  (02/22/2022)   Overall Financial Resource Strain (CARDIA)  Difficulty of Paying Living Expenses: Not hard at all  Food Insecurity: No Food Insecurity (02/22/2022)   Hunger Vital Sign    Worried About Running Out of Food in the Last Year: Never true    Ran Out of Food in the Last Year: Never true  Transportation Needs: No Transportation Needs (02/22/2022)   PRAPARE - Hydrologist (Medical): No    Lack of Transportation (Non-Medical): No  Physical Activity: Not on file  Stress: Not on file  Social Connections: Not on file  Intimate Partner Violence: Not on file    FAMILY HISTORY: Family History  Problem Relation Age of Onset   Lung cancer Mother 59       she smoked   Prostate cancer Brother 32   Breast cancer Cousin        paternal first cousin   Breast cancer Cousin        paternal first cousin    Review of Systems  Constitutional:  Positive for fatigue. Negative for appetite change, chills, fever and unexpected weight change.  HENT:    Negative for hearing loss, lump/mass and trouble swallowing.   Eyes:  Negative for eye problems and icterus.  Respiratory:  Negative for chest tightness, cough and shortness of breath.   Cardiovascular:  Negative for chest pain, leg swelling and palpitations.  Gastrointestinal:  Negative for abdominal distention, abdominal pain, constipation, diarrhea, nausea and vomiting.  Endocrine: Negative for hot flashes.  Genitourinary:  Negative for difficulty urinating.   Musculoskeletal:  Negative for arthralgias.  Skin:  Negative for itching and rash.  Neurological:  Negative for dizziness, extremity weakness, headaches and numbness.  Hematological:  Negative for adenopathy. Does not bruise/bleed easily.  Psychiatric/Behavioral:  Negative for depression. The patient is not nervous/anxious.       PHYSICAL EXAMINATION  ECOG PERFORMANCE STATUS: 1 - Symptomatic but completely ambulatory  Vitals:   05/09/22 1446 05/09/22 1447  BP: 132/74 124/67  Pulse: 91 (!) 103  Resp:    Temp:    SpO2:    Orthostatic VS noted in [facility]  Physical Exam Constitutional:      General: She is not in acute distress.    Appearance: Normal appearance. She is not toxic-appearing.  HENT:     Head: Normocephalic and atraumatic.  Eyes:     General: No scleral icterus. Cardiovascular:     Rate and Rhythm: Normal rate and regular rhythm.     Pulses: Normal pulses.     Heart sounds: Normal heart sounds.  Pulmonary:     Effort: Pulmonary effort is normal.     Breath sounds: Normal breath sounds.  Abdominal:     General: Abdomen is flat. Bowel sounds are normal. There is no distension.     Palpations: Abdomen is soft.     Tenderness: There is no abdominal tenderness.  Musculoskeletal:        General: No swelling.     Cervical back: Neck supple.  Lymphadenopathy:     Cervical: No cervical adenopathy.  Skin:    General: Skin is warm and dry.     Findings: No rash.  Neurological:     General: No focal deficit  present.     Mental Status: She is alert.  Psychiatric:        Mood and Affect: Mood normal.        Behavior: Behavior normal.     LABORATORY DATA:  CBC    Component Value Date/Time  WBC 3.6 (L) 05/09/2022 1348   WBC 7.0 03/29/2022 1332   RBC 3.96 05/09/2022 1348   HGB 11.3 (L) 05/09/2022 1348   HCT 33.3 (L) 05/09/2022 1348   PLT 227 05/09/2022 1348   MCV 84.1 05/09/2022 1348   MCH 28.5 05/09/2022 1348   MCHC 33.9 05/09/2022 1348   RDW 12.0 05/09/2022 1348   LYMPHSABS 0.3 (L) 05/09/2022 1348   MONOABS 0.1 05/09/2022 1348   EOSABS 0.1 05/09/2022 1348   BASOSABS 0.0 05/09/2022 1348    CMP     Component Value Date/Time   NA 134 (L) 05/09/2022 1348   K 4.2 05/09/2022 1348   CL 103 05/09/2022 1348   CO2 27 05/09/2022 1348   GLUCOSE 113 (H) 05/09/2022 1348   BUN 13 05/09/2022 1348   CREATININE 0.81 05/09/2022 1348   CALCIUM 8.6 (L) 05/09/2022 1348   PROT 6.2 (L) 05/09/2022 1348   ALBUMIN 3.8 05/09/2022 1348   AST 30 05/09/2022 1348   ALT 33 05/09/2022 1348   ALKPHOS 74 05/09/2022 1348   BILITOT 0.7 05/09/2022 1348   GFRNONAA >60 05/09/2022 1348   GFRAA  01/13/2009 1007    >60        The eGFR has been calculated using the MDRD equation. This calculation has not been validated in all clinical situations. eGFR's persistently <60 mL/min signify possible Chronic Kidney Disease.     ASSESSMENT and THERAPY PLAN:   Malignant neoplasm of upper-outer quadrant of left breast in female, estrogen receptor negative ([facility]) Haylin is a 77 year old woman with HER-2 positive left breast cancer s/p mastectomy and her first cycle of Taxotere, [name], Perjeta on 05/05/2022.  She is here for urgent evaluation of what appears to be vasovagal syncope.  Her orthostatic vs are slightly positive, and she is tachycardic today which indicates dehydration.  She will receive 1 liter of fliuds today.    I reviewed her labs with her today which are stable, most importantly her creatinine  which indicates that we caught her dehydration early.  I recommended she monitor her bp at home.  Dr. Lindi Adie recommended an OMRON monitor since their current monitor has not been giving them accurate readings.  They will bring BP readings to their next appt later this week.   She will return later this week for lab and f/u with Dr. Lindi Adie.       All questions were answered. The patient knows to call the clinic with any problems, questions or concerns. We can certainly see the patient much sooner if necessary.  Total encounter time:30 minutes*in face-to-face visit time, chart review, lab review, care coordination, order entry, and documentation of the encounter time.    Wilber Bihari, NP 05/10/22 7:06 PM Medical Oncology and Hematology [facility] [place], [place] [address] Tel. [phone]    Fax. [phone]  *Total Encounter Time as defined by the Centers for Medicare and Medicaid Services includes, in addition to the face-to-face time of a patient visit (documented in the note above) non-face-to-face time: obtaining and reviewing outside history, ordering and reviewing medications, tests or procedures, care coordination (communications with other health care professionals or caregivers) and documentation in the medical record.

## 2022-05-10 NOTE — Assessment & Plan Note (Addendum)
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  She is here for urgent evaluation of what appears to be vasovagal syncope.  Her orthostatic vs are slightly positive, and she is tachycardic today which indicates dehydration.  She will receive 1 liter of fliuds today.    I reviewed her labs with her today which are stable, most importantly her creatinine which indicates that we caught her dehydration early.  I recommended she monitor her bp at home.  Dr. Lindi Adie recommended an OMRON monitor since their current monitor has not been giving them accurate readings.  They will bring BP readings to their next appt later this week.   She will return later this week for lab and f/u with Dr. Lindi Adie.

## 2022-05-11 ENCOUNTER — Encounter (HOSPITAL_COMMUNITY): Payer: Self-pay

## 2022-05-11 ENCOUNTER — Other Ambulatory Visit: Payer: Self-pay

## 2022-05-11 ENCOUNTER — Inpatient Hospital Stay (HOSPITAL_COMMUNITY)
Admission: EM | Admit: 2022-05-11 | Discharge: 2022-05-18 | DRG: 809 | Disposition: A | Discharge status: Home Health | Payer: Medicare Other | Attending: Family Medicine | Admitting: Family Medicine

## 2022-05-11 DIAGNOSIS — K5289 Other specified noninfective gastroenteritis and colitis: Secondary | ICD-10-CM | POA: Diagnosis present

## 2022-05-11 DIAGNOSIS — F32A Depression, unspecified: Secondary | ICD-10-CM | POA: Diagnosis present

## 2022-05-11 DIAGNOSIS — E869 Volume depletion, unspecified: Secondary | ICD-10-CM | POA: Diagnosis present

## 2022-05-11 DIAGNOSIS — R197 Diarrhea, unspecified: Secondary | ICD-10-CM | POA: Diagnosis not present

## 2022-05-11 DIAGNOSIS — K529 Noninfective gastroenteritis and colitis, unspecified: Secondary | ICD-10-CM | POA: Diagnosis not present

## 2022-05-11 DIAGNOSIS — E872 Acidosis, unspecified: Secondary | ICD-10-CM | POA: Diagnosis present

## 2022-05-11 DIAGNOSIS — D701 Agranulocytosis secondary to cancer chemotherapy: Secondary | ICD-10-CM | POA: Diagnosis not present

## 2022-05-11 DIAGNOSIS — I10 Essential (primary) hypertension: Secondary | ICD-10-CM | POA: Diagnosis present

## 2022-05-11 DIAGNOSIS — R531 Weakness: Secondary | ICD-10-CM | POA: Diagnosis present

## 2022-05-11 DIAGNOSIS — T451X5A Adverse effect of antineoplastic and immunosuppressive drugs, initial encounter: Secondary | ICD-10-CM | POA: Diagnosis not present

## 2022-05-11 DIAGNOSIS — C50412 Malignant neoplasm of upper-outer quadrant of left female breast: Secondary | ICD-10-CM | POA: Diagnosis present

## 2022-05-11 DIAGNOSIS — R112 Nausea with vomiting, unspecified: Secondary | ICD-10-CM | POA: Diagnosis not present

## 2022-05-11 DIAGNOSIS — D6489 Other specified anemias: Secondary | ICD-10-CM | POA: Diagnosis present

## 2022-05-11 DIAGNOSIS — R55 Syncope and collapse: Secondary | ICD-10-CM | POA: Diagnosis not present

## 2022-05-11 DIAGNOSIS — K5730 Diverticulosis of large intestine without perforation or abscess without bleeding: Secondary | ICD-10-CM | POA: Diagnosis not present

## 2022-05-11 DIAGNOSIS — E876 Hypokalemia: Secondary | ICD-10-CM | POA: Diagnosis present

## 2022-05-11 DIAGNOSIS — Z171 Estrogen receptor negative status [ER-]: Secondary | ICD-10-CM

## 2022-05-11 DIAGNOSIS — I5032 Chronic diastolic (congestive) heart failure: Secondary | ICD-10-CM | POA: Diagnosis present

## 2022-05-11 DIAGNOSIS — R110 Nausea: Secondary | ICD-10-CM | POA: Diagnosis not present

## 2022-05-11 DIAGNOSIS — B029 Zoster without complications: Secondary | ICD-10-CM | POA: Diagnosis present

## 2022-05-11 DIAGNOSIS — E785 Hyperlipidemia, unspecified: Secondary | ICD-10-CM | POA: Diagnosis present

## 2022-05-11 DIAGNOSIS — R079 Chest pain, unspecified: Secondary | ICD-10-CM | POA: Diagnosis not present

## 2022-05-11 DIAGNOSIS — I110 Hypertensive heart disease with heart failure: Secondary | ICD-10-CM | POA: Diagnosis present

## 2022-05-11 DIAGNOSIS — C50912 Malignant neoplasm of unspecified site of left female breast: Secondary | ICD-10-CM | POA: Diagnosis not present

## 2022-05-11 DIAGNOSIS — S0990XA Unspecified injury of head, initial encounter: Secondary | ICD-10-CM | POA: Diagnosis not present

## 2022-05-11 DIAGNOSIS — R1111 Vomiting without nausea: Secondary | ICD-10-CM | POA: Diagnosis not present

## 2022-05-11 DIAGNOSIS — K7689 Other specified diseases of liver: Secondary | ICD-10-CM | POA: Diagnosis not present

## 2022-05-11 DIAGNOSIS — K219 Gastro-esophageal reflux disease without esophagitis: Secondary | ICD-10-CM | POA: Diagnosis not present

## 2022-05-11 DIAGNOSIS — R42 Dizziness and giddiness: Secondary | ICD-10-CM | POA: Diagnosis not present

## 2022-05-11 DIAGNOSIS — Z9012 Acquired absence of left breast and nipple: Secondary | ICD-10-CM | POA: Diagnosis not present

## 2022-05-11 DIAGNOSIS — C50919 Malignant neoplasm of unspecified site of unspecified female breast: Secondary | ICD-10-CM | POA: Diagnosis not present

## 2022-05-11 DIAGNOSIS — B028 Zoster with other complications: Secondary | ICD-10-CM | POA: Diagnosis not present

## 2022-05-11 DIAGNOSIS — K1230 Oral mucositis (ulcerative), unspecified: Secondary | ICD-10-CM | POA: Diagnosis not present

## 2022-05-11 DIAGNOSIS — R569 Unspecified convulsions: Secondary | ICD-10-CM | POA: Diagnosis not present

## 2022-05-11 DIAGNOSIS — G4089 Other seizures: Secondary | ICD-10-CM | POA: Diagnosis present

## 2022-05-11 DIAGNOSIS — D709 Neutropenia, unspecified: Secondary | ICD-10-CM | POA: Diagnosis not present

## 2022-05-11 NOTE — ED Triage Notes (Signed)
Pt has Breast cancer - had Chemo last Friday.  Today has had N/V today.  EMS gave 4 mg Zofran IV and 500 mLs of NS.

## 2022-05-12 ENCOUNTER — Emergency Department (HOSPITAL_COMMUNITY): Payer: Medicare Other

## 2022-05-12 ENCOUNTER — Inpatient Hospital Stay: Payer: Medicare Other

## 2022-05-12 ENCOUNTER — Encounter (HOSPITAL_COMMUNITY): Payer: Self-pay

## 2022-05-12 ENCOUNTER — Other Ambulatory Visit: Payer: Self-pay

## 2022-05-12 ENCOUNTER — Ambulatory Visit: Payer: Medicare Other | Admitting: Physician Assistant

## 2022-05-12 ENCOUNTER — Observation Stay (HOSPITAL_COMMUNITY)
Admit: 2022-05-12 | Discharge: 2022-05-12 | Disposition: A | Discharge status: Home / Self Care | Payer: Medicare Other | Attending: Internal Medicine | Admitting: Internal Medicine

## 2022-05-12 ENCOUNTER — Inpatient Hospital Stay: Payer: Medicare Other | Admitting: Hematology and Oncology

## 2022-05-12 DIAGNOSIS — E872 Acidosis, unspecified: Secondary | ICD-10-CM | POA: Diagnosis not present

## 2022-05-12 DIAGNOSIS — D701 Agranulocytosis secondary to cancer chemotherapy: Secondary | ICD-10-CM | POA: Diagnosis not present

## 2022-05-12 DIAGNOSIS — C50912 Malignant neoplasm of unspecified site of left female breast: Secondary | ICD-10-CM | POA: Diagnosis not present

## 2022-05-12 DIAGNOSIS — R569 Unspecified convulsions: Secondary | ICD-10-CM | POA: Diagnosis not present

## 2022-05-12 DIAGNOSIS — R32 Unspecified urinary incontinence: Secondary | ICD-10-CM | POA: Insufficient documentation

## 2022-05-12 DIAGNOSIS — T451X5A Adverse effect of antineoplastic and immunosuppressive drugs, initial encounter: Secondary | ICD-10-CM | POA: Diagnosis not present

## 2022-05-12 DIAGNOSIS — K219 Gastro-esophageal reflux disease without esophagitis: Secondary | ICD-10-CM

## 2022-05-12 DIAGNOSIS — K529 Noninfective gastroenteritis and colitis, unspecified: Secondary | ICD-10-CM | POA: Insufficient documentation

## 2022-05-12 DIAGNOSIS — C50919 Malignant neoplasm of unspecified site of unspecified female breast: Secondary | ICD-10-CM | POA: Diagnosis not present

## 2022-05-12 DIAGNOSIS — G3184 Mild cognitive impairment, so stated: Secondary | ICD-10-CM | POA: Insufficient documentation

## 2022-05-12 DIAGNOSIS — R079 Chest pain, unspecified: Secondary | ICD-10-CM | POA: Diagnosis not present

## 2022-05-12 DIAGNOSIS — K589 Irritable bowel syndrome without diarrhea: Secondary | ICD-10-CM | POA: Insufficient documentation

## 2022-05-12 DIAGNOSIS — K5730 Diverticulosis of large intestine without perforation or abscess without bleeding: Secondary | ICD-10-CM | POA: Diagnosis not present

## 2022-05-12 DIAGNOSIS — I5032 Chronic diastolic (congestive) heart failure: Secondary | ICD-10-CM

## 2022-05-12 DIAGNOSIS — R55 Syncope and collapse: Secondary | ICD-10-CM

## 2022-05-12 DIAGNOSIS — K1230 Oral mucositis (ulcerative), unspecified: Secondary | ICD-10-CM

## 2022-05-12 DIAGNOSIS — K7689 Other specified diseases of liver: Secondary | ICD-10-CM | POA: Diagnosis not present

## 2022-05-12 DIAGNOSIS — S0990XA Unspecified injury of head, initial encounter: Secondary | ICD-10-CM | POA: Diagnosis not present

## 2022-05-12 DIAGNOSIS — I10 Essential (primary) hypertension: Secondary | ICD-10-CM

## 2022-05-12 DIAGNOSIS — F3340 Major depressive disorder, recurrent, in remission, unspecified: Secondary | ICD-10-CM | POA: Insufficient documentation

## 2022-05-12 DIAGNOSIS — R112 Nausea with vomiting, unspecified: Secondary | ICD-10-CM | POA: Diagnosis not present

## 2022-05-12 DIAGNOSIS — F1010 Alcohol abuse, uncomplicated: Secondary | ICD-10-CM | POA: Insufficient documentation

## 2022-05-12 DIAGNOSIS — J309 Allergic rhinitis, unspecified: Secondary | ICD-10-CM | POA: Insufficient documentation

## 2022-05-12 LAB — CBC WITH DIFFERENTIAL/PLATELET
Abs Immature Granulocytes: 0 10*3/uL (ref 0.00–0.07)
Basophils Absolute: 0 10*3/uL (ref 0.0–0.1)
Basophils Relative: 6 %
Eosinophils Absolute: 0 10*3/uL (ref 0.0–0.5)
Eosinophils Relative: 3 %
HCT: 31.9 % — ABNORMAL LOW (ref 36.0–46.0)
Hemoglobin: 10.5 g/dL — ABNORMAL LOW (ref 12.0–15.0)
Immature Granulocytes: 0 %
Lymphocytes Relative: 54 %
Lymphs Abs: 0.2 10*3/uL — ABNORMAL LOW (ref 0.7–4.0)
MCH: 28.5 pg (ref 26.0–34.0)
MCHC: 32.9 g/dL (ref 30.0–36.0)
MCV: 86.7 fL (ref 80.0–100.0)
Monocytes Absolute: 0.1 10*3/uL (ref 0.1–1.0)
Monocytes Relative: 23 %
Neutro Abs: 0.1 10*3/uL — CL (ref 1.7–7.7)
Neutrophils Relative %: 14 %
Platelets: 202 10*3/uL (ref 150–400)
RBC: 3.68 MIL/uL — ABNORMAL LOW (ref 3.87–5.11)
RDW: 11.9 % (ref 11.5–15.5)
WBC: 0.4 10*3/uL — CL (ref 4.0–10.5)
nRBC: 0 % (ref 0.0–0.2)

## 2022-05-12 LAB — COMPREHENSIVE METABOLIC PANEL
ALT: 35 U/L (ref 0–44)
AST: 29 U/L (ref 15–41)
Albumin: 3.4 g/dL — ABNORMAL LOW (ref 3.5–5.0)
Alkaline Phosphatase: 69 U/L (ref 38–126)
Anion gap: 7 (ref 5–15)
BUN: 10 mg/dL (ref 8–23)
CO2: 24 mmol/L (ref 22–32)
Calcium: 8.4 mg/dL — ABNORMAL LOW (ref 8.9–10.3)
Chloride: 104 mmol/L (ref 98–111)
Creatinine, Ser: 0.72 mg/dL (ref 0.44–1.00)
GFR, Estimated: >60 mL/min (ref >60)
Glucose, Bld: 114 mg/dL — ABNORMAL HIGH (ref 70–99)
Potassium: 3.5 mmol/L (ref 3.5–5.1)
Sodium: 135 mmol/L (ref 135–145)
Total Bilirubin: 0.9 mg/dL (ref 0.3–1.2)
Total Protein: 6.1 g/dL — ABNORMAL LOW (ref 6.5–8.1)

## 2022-05-12 LAB — TROPONIN I (HIGH SENSITIVITY)
Troponin I (High Sensitivity): 6 ng/L (ref <18)
Troponin I (High Sensitivity): 6 ng/L (ref <18)

## 2022-05-12 LAB — LACTIC ACID, PLASMA
Lactic Acid, Venous: 1.8 mmol/L (ref 0.5–1.9)
Lactic Acid, Venous: 2.3 mmol/L (ref 0.5–1.9)
Lactic Acid, Venous: 1.1 mmol/L (ref 0.5–1.9)

## 2022-05-12 LAB — URINALYSIS, ROUTINE W REFLEX MICROSCOPIC
Bilirubin Urine: NEGATIVE
Glucose, UA: 50 mg/dL — AB
Ketones, ur: NEGATIVE mg/dL
Leukocytes,Ua: NEGATIVE
Nitrite: NEGATIVE
Protein, ur: NEGATIVE mg/dL
Specific Gravity, Urine: 1.029 (ref 1.005–1.030)
pH: 7 (ref 5.0–8.0)

## 2022-05-12 LAB — MAGNESIUM: Magnesium: 1.7 mg/dL (ref 1.7–2.4)

## 2022-05-12 LAB — CBG MONITORING, ED: Glucose-Capillary: 105 mg/dL — ABNORMAL HIGH (ref 70–99)

## 2022-05-12 LAB — LIPASE, BLOOD: Lipase: 23 U/L (ref 11–51)

## 2022-05-12 MED ORDER — LACTATED RINGERS IV SOLN: INTRAVENOUS | Status: AC

## 2022-05-12 MED ORDER — TBO-FILGRASTIM 480 MCG/0.8ML ~~LOC~~ SOSY
480.0000 ug | PREFILLED_SYRINGE | Freq: Every day | SUBCUTANEOUS | Status: DC
  Administered 2022-05-12 – 2022-05-13 (×2): 480 ug via SUBCUTANEOUS
  Filled 2022-05-12 (×2): qty 0.8

## 2022-05-12 MED ORDER — TRAZODONE HCL 50 MG PO TABS
225.0000 mg | ORAL_TABLET | Freq: Every day | ORAL | Status: DC
  Administered 2022-05-12: 150 mg via ORAL
  Administered 2022-05-13 – 2022-05-17 (×5): 225 mg via ORAL
  Filled 2022-05-12: qty 5
  Filled 2022-05-12 (×3): qty 1
  Filled 2022-05-12 (×2): qty 5

## 2022-05-12 MED ORDER — HYDRALAZINE HCL 20 MG/ML IJ SOLN: 10.0000 mg | Freq: Four times a day (QID) | INTRAMUSCULAR | Status: DC | PRN

## 2022-05-12 MED ORDER — VENLAFAXINE HCL ER 75 MG PO CP24
75.0000 mg | ORAL_CAPSULE | Freq: Every day | ORAL | Status: DC
  Administered 2022-05-12 – 2022-05-18 (×7): 75 mg via ORAL
  Filled 2022-05-12 (×7): qty 1

## 2022-05-12 MED ORDER — SODIUM CHLORIDE 0.9 % IV SOLN
2.0000 g | Freq: Two times a day (BID) | INTRAVENOUS | Status: DC
  Administered 2022-05-12 – 2022-05-18 (×12): 2 g via INTRAVENOUS
  Filled 2022-05-12 (×12): qty 12.5

## 2022-05-12 MED ORDER — DIPHENOXYLATE-ATROPINE 2.5-0.025 MG PO TABS
1.0000 | ORAL_TABLET | Freq: Four times a day (QID) | ORAL | Status: DC | PRN
  Administered 2022-05-15 – 2022-05-16 (×2): 1 via ORAL
  Filled 2022-05-12 (×2): qty 1

## 2022-05-12 MED ORDER — IOHEXOL 350 MG/ML SOLN
100.0000 mL | Freq: Once | INTRAVENOUS | Status: AC | PRN
  Administered 2022-05-12: 100 mL via INTRAVENOUS

## 2022-05-12 MED ORDER — CEFEPIME HCL 2 G IV SOLR
2.0000 g | Freq: Once | INTRAVENOUS | Status: AC
  Administered 2022-05-12: 2 g via INTRAVENOUS
  Filled 2022-05-12: qty 12.5

## 2022-05-12 MED ORDER — SODIUM CHLORIDE (PF) 0.9 % IJ SOLN
INTRAMUSCULAR | Status: AC
  Administered 2022-05-12: 10 mL
  Filled 2022-05-12: qty 50

## 2022-05-12 MED ORDER — LIP MEDEX EX OINT
TOPICAL_OINTMENT | Freq: Once | CUTANEOUS | Status: AC
  Filled 2022-05-12: qty 7

## 2022-05-12 MED ORDER — ENOXAPARIN SODIUM 40 MG/0.4ML IJ SOSY
40.0000 mg | PREFILLED_SYRINGE | Freq: Every day | INTRAMUSCULAR | Status: DC
  Administered 2022-05-12 – 2022-05-18 (×7): 40 mg via SUBCUTANEOUS
  Filled 2022-05-12 (×7): qty 0.4

## 2022-05-12 MED ORDER — BUPROPION HCL ER (XL) 300 MG PO TB24
300.0000 mg | ORAL_TABLET | Freq: Every morning | ORAL | Status: DC
  Administered 2022-05-12 – 2022-05-18 (×7): 300 mg via ORAL
  Filled 2022-05-12 (×5): qty 1
  Filled 2022-05-12: qty 2
  Filled 2022-05-12: qty 1

## 2022-05-12 MED ORDER — MAGIC MOUTHWASH W/LIDOCAINE: 15.0000 mL | Freq: Four times a day (QID) | ORAL | Status: DC | PRN

## 2022-05-12 MED ORDER — MAGIC MOUTHWASH
15.0000 mL | Freq: Four times a day (QID) | ORAL | Status: DC | PRN
  Administered 2022-05-12: 15 mL via ORAL
  Administered 2022-05-12: 10 mL via ORAL
  Administered 2022-05-12: 15 mL via ORAL
  Filled 2022-05-12 (×5): qty 15

## 2022-05-12 MED ORDER — MAGIC MOUTHWASH W/LIDOCAINE
15.0000 mL | Freq: Four times a day (QID) | ORAL | Status: DC | PRN
  Administered 2022-05-14 – 2022-05-17 (×3): 15 mL via ORAL
  Filled 2022-05-12 (×4): qty 15

## 2022-05-12 MED ORDER — MELOXICAM 15 MG PO TABS
15.0000 mg | ORAL_TABLET | Freq: Every day | ORAL | Status: DC
  Administered 2022-05-12 – 2022-05-18 (×7): 15 mg via ORAL
  Filled 2022-05-12 (×7): qty 1

## 2022-05-12 MED ORDER — CLONAZEPAM 1 MG PO TABS
1.0000 mg | ORAL_TABLET | Freq: Every day | ORAL | Status: DC
  Administered 2022-05-12 – 2022-05-17 (×6): 1 mg via ORAL
  Filled 2022-05-12 (×6): qty 1

## 2022-05-12 MED ORDER — FLUTICASONE PROPIONATE 50 MCG/ACT NA SUSP: 1.0000 | Freq: Every day | NASAL | Status: DC | PRN

## 2022-05-12 MED ORDER — LOSARTAN POTASSIUM 25 MG PO TABS
50.0000 mg | ORAL_TABLET | Freq: Every day | ORAL | Status: DC
Start: 2022-05-12 — End: 2022-05-12

## 2022-05-12 MED ORDER — ONDANSETRON HCL 4 MG/2ML IJ SOLN: 4.0000 mg | Freq: Four times a day (QID) | INTRAMUSCULAR | Status: DC | PRN

## 2022-05-12 MED ORDER — POLYETHYLENE GLYCOL 3350 17 G PO PACK: 17.0000 g | PACK | Freq: Every day | ORAL | Status: DC | PRN

## 2022-05-12 MED ORDER — ACETAMINOPHEN 325 MG PO TABS
650.0000 mg | ORAL_TABLET | Freq: Four times a day (QID) | ORAL | Status: DC | PRN
  Administered 2022-05-12 – 2022-05-17 (×7): 650 mg via ORAL
  Filled 2022-05-12 (×7): qty 2

## 2022-05-12 MED ORDER — SODIUM CHLORIDE 0.9 % IV BOLUS
1000.0000 mL | Freq: Once | INTRAVENOUS | Status: AC
  Administered 2022-05-12: 1000 mL via INTRAVENOUS

## 2022-05-12 MED ORDER — METRONIDAZOLE 500 MG/100ML IV SOLN
500.0000 mg | Freq: Two times a day (BID) | INTRAVENOUS | Status: DC
Start: 2022-05-12 — End: 2022-05-18
  Administered 2022-05-12 – 2022-05-18 (×13): 500 mg via INTRAVENOUS
  Filled 2022-05-12 (×13): qty 100

## 2022-05-12 MED ORDER — SODIUM CHLORIDE 0.9 % IV SOLN
8.0000 mg | Freq: Three times a day (TID) | INTRAVENOUS | Status: DC
  Administered 2022-05-12 – 2022-05-13 (×4): 8 mg via INTRAVENOUS
  Filled 2022-05-12 (×7): qty 4

## 2022-05-12 MED ORDER — ACETAMINOPHEN 650 MG RE SUPP: 650.0000 mg | Freq: Four times a day (QID) | RECTAL | Status: DC | PRN

## 2022-05-12 MED ORDER — ONDANSETRON HCL 4 MG PO TABS: 4.0000 mg | ORAL_TABLET | Freq: Four times a day (QID) | ORAL | Status: DC | PRN

## 2022-05-12 NOTE — H&P (Signed)
History and Physical    Patient: Allison Pierce MRN: [id number] DOA: 05/11/2022  Date of Service: the patient was seen and examined on 05/12/2022  Patient coming from: Home  Chief Complaint:  Chief Complaint  Patient presents with   Emesis    HPI:   76 year old female with past medical history of left breast cancer  (Dx 01/2022, S/P left mastectomy 04/2022, on chemo follows with Dr. Lindi Adie), diastolic congestive heart failure (Echo 01/2022 EF 60-65% with G2DD), gastroesophageal flux disease, hyperlipidemia, hypertension presenting to [facility] emergency department with complaints of "seizures" according to the husband.  Patient explains that she received her first infusion of chemotherapy (docetaxel, trastuzumab and pertuzumab) on 8/4.  Shortly after this infusion patient began to experience intense nausea without vomiting.  This resulted in extremely poor oral intake in the days that followed and eventually became associated with frequent bouts of watery diarrhea of the patient estimates is occurring upwards of 3 times daily.    As the patient's symptoms continued to persist she began to experience intense bouts of lightheadedness upon rising from a seated position. Since 8/7, the patient began to experience episodes of loss of consciousness upon standing, particularly when she attempts to get up from the toilet after an episode of diarrhea.  The patient's husband who witnessed several of these episodes noted that she would briefly exhibit shaking activity after losing consciousness dubbing it as a "seizure."  Patient has no recollection of these episodes.  Patient denies any tongue biting or postictal state.  Patient does endorse occasionally having bladder incontinence however.  Upon further questioning patient denies fevers, sick contacts or contacts with confirmed COVID-19 infection.  Due to patient's worsening symptoms with persisting weakness nausea diarrhea and episodes  of loss of consciousness this was eventually contacted who promptly came to evaluate the patient and brought her to [facility] emergency department for evaluation.  Upon evaluation in the emergency department patient was noted to be clinically volume depleted with lactic acidosis.  Due to concerns over    Review of Systems: Review of Systems  Gastrointestinal:  Positive for diarrhea and nausea.  Musculoskeletal:  Positive for falls.  Neurological:  Positive for loss of consciousness and weakness.  All other systems reviewed and are negative.    Past Medical History:  Diagnosis Date   Anxiety    Depression    GERD (gastroesophageal reflux disease) OCCASIONALLY  TAKE TUMS   Human papilloma virus    Hyperlipidemia    Hypertension    UTI (urinary tract infection)    Vulvar lesion     Past Surgical History:  Procedure Laterality Date   ABDOMINAL HYSTERECTOMY  1992   W/ SALPINGO-OOPHORECTOMY   ANTERIOR CERVICAL DECOMP/DISCECTOMY FUSION  01-18-2009  DR POOLE   C4  - C6   AUGMENTATION MAMMAPLASTY Bilateral    BREAST BIOPSY Right    BREAST BIOPSY Left 02/10/2022   x2   BREAST ENHANCEMENT SURGERY  2011   BREAST EXCISIONAL BIOPSY Left    BREAST RECONSTRUCTION WITH PLACEMENT OF TISSUE EXPANDER AND FLEX HD (ACELLULAR HYDRATED DERMIS) Left 04/07/2022   Procedure: BREAST RECONSTRUCTION WITH PLACEMENT OF TISSUE EXPANDER AND FLEX HD (ACELLULAR HYDRATED DERMIS);  Surgeon: [name], MD;  Location: [place];  Service: Plastics;  Laterality: Left;   BREAST SURGERY  02-04-2011  dr Margot Chimes   EXCISION LEFT BREAST MASS--  CALCIFICATION   EYE SURGERY Bilateral    cataract removal   MASTECTOMY W/ SENTINEL NODE BIOPSY Left  04/07/2022   Procedure: LEFT MASTECTOMY WITH SENTINEL NODE BIOPSY;  Surgeon: Coralie Keens, MD;  Location: [place];  Service: General;  Laterality: Left;  LMA   PORTACATH PLACEMENT N/A 04/07/2022   Procedure: PORT-A-CATH INSERTION WITH ULTRASOUND GUIDANCE;  Surgeon:  Coralie Keens, MD;  Location: [place];  Service: General;  Laterality: N/A;   VULVAR LESION REMOVAL  09/10/2012   Procedure: VULVAR LESION;  Surgeon: Selinda Orion, MD;  Location: [facility];  Service: Gynecology;  Laterality: N/A;  WIDE EXCISION OF VULVAR LESION    Social History:  reports that she has never smoked. She has never used smokeless tobacco. She reports current alcohol use of about 7.0 standard drinks of alcohol per week. She reports that she does not use drugs.  Allergies  Allergen Reactions   Gadolinium Derivatives Itching and Other (See Comments)    Redness and itching, skins feels like it was burning. In the future pt will need pre med. Redness and itching, skins feels like it was burning. In the future pt will need pre med.   Atorvastatin Calcium Other (See Comments)   Azelaic Acid Other (See Comments)   Fluoxetine Other (See Comments)   Oxybutynin Other (See Comments)   Prozac [Fluoxetine Hcl] Other (See Comments)    "COULDN'T LIFT HEAD AND GET OUT OF BED"   Ritalin [Methylphenidate] Other (See Comments)   Rosuvastatin Other (See Comments)   Lyrica [Pregabalin] Anxiety    "FELT WEIRD"    Family History  Problem Relation Age of Onset   Lung cancer Mother 41       she smoked   Prostate cancer Brother 47   Breast cancer Cousin        paternal first cousin   Breast cancer Cousin        paternal first cousin    Prior to Admission medications   Medication Sig Start Date End Date Taking? Authorizing Provider  B Complex-C (B COMPLEX-VITAMIN C) CAPS Take 1 capsule by mouth daily.   Yes [provider]  buPROPion (WELLBUTRIN XL) 300 MG 24 hr tablet Take 300 mg by mouth every morning.   Yes [provider]  calcium carbonate (TUMS - DOSED IN MG ELEMENTAL CALCIUM) 500 MG chewable tablet Chew 1 tablet by mouth daily as needed for indigestion or heartburn.   Yes [provider]  cholecalciferol (VITAMIN D3) 25 MCG (1000 UNIT)  tablet Take 1,000 Units by mouth daily.   Yes [provider]  clonazePAM (KLONOPIN) 1 MG tablet Take 1 mg by mouth at bedtime.   Yes [provider]  dexamethasone (DECADRON) 4 MG tablet Take 1 tablet (4 mg total) by mouth daily. Take 1 tablet day before chemo and 1 tablet day after chemo with food 04/27/22  Yes Nicholas Lose, MD  fluticasone (FLONASE) 50 MCG/ACT nasal spray Place 1 spray into both nostrils daily as needed for allergies.   Yes [provider]  lidocaine-prilocaine (EMLA) cream Apply to affected area once 04/27/22  Yes Nicholas Lose, MD  losartan (COZAAR) 25 MG tablet Take 50 mg by mouth daily. 01/13/22  Yes [provider]  meloxicam (MOBIC) 15 MG tablet Take 15 mg by mouth daily.   Yes [provider]  Potassium 95 MG TABS Take 95 mg by mouth daily.   Yes [provider]  traZODone (DESYREL) 150 MG tablet Take 225 mg by mouth at bedtime.   Yes [provider]  venlafaxine XR (EFFEXOR-XR) 75 MG 24 hr capsule  Take 75 mg by mouth daily with breakfast.   Yes [provider]  vitamin B-12 (CYANOCOBALAMIN) 500 MCG tablet Take 500 mcg by mouth daily.   Yes [provider]  ondansetron (ZOFRAN) 8 MG tablet Take 1 tablet (8 mg total) by mouth 2 (two) times daily as needed for refractory nausea / vomiting. Patient not taking: Reported on 05/12/2022 04/27/22   Nicholas Lose, MD  prochlorperazine (COMPAZINE) 10 MG tablet Take 1 tablet (10 mg total) by mouth every 6 (six) hours as needed (Nausea or vomiting). Patient not taking: Reported on 05/12/2022 04/27/22   Nicholas Lose, MD    Physical Exam:  Vitals:   05/12/22 0455 05/12/22 0530 05/12/22 0700 05/12/22 0701  BP:  (!) 170/80 124/82 124/82  Pulse:  (!) 103  (!) 107  Resp:  18  20  Temp: 98.7 F (37.1 C)     TempSrc:      SpO2:  97%  96%  Weight:      Height:        Constitutional: Lethargic but arousable and oriented x 3, no associated distress.    Skin: no rashes, no lesions, EXTR only poor skin turgor noted. Eyes: Pupils are equally reactive to light.  No evidence of scleral icterus or conjunctival pallor.  ENMT: Markedly hyperemic tongue and mucous membranes of the oral mucosa with several ulcerations noted.  Mucous membranes are dry.  No evidence of oropharyngeal thrush.   Neck: normal, supple, no masses, no thyromegaly.  No evidence of jugular venous distension.   Respiratory: clear to auscultation bilaterally, no wheezing, no crackles. Normal respiratory effort. No accessory muscle use.  Cardiovascular: Tachycardia heart rate with even Gillooly irregular rhythm, no murmurs / rubs / gallops. No extremity edema. 2+ pedal pulses. No carotid bruits.  Chest:   Nontender without crepitus or deformity.   Back:   Nontender without crepitus or deformity. Abdomen: Notable left-sided abdominal tenderness.  Abdomen is soft.  No evidence of intra-abdominal masses.  Positive bowel sounds noted in all quadrants.   Musculoskeletal: No joint deformity upper and lower extremities. Good ROM, no contractures. Normal muscle tone.  Neurologic: CN 2-12 grossly intact. Sensation intact.  Patient moving all 4 extremities spontaneously.  Patient is following all commands.  Patient is responsive to verbal stimuli.   Psychiatric: Patient exhibits normal mood with appropriate affect.  Patient seems to possess insight as to their current situation.    Data Reviewed:  I have personally reviewed and interpreted labs, imaging.  Significant findings are:  Lactic acid 2.3 Chemistry revealing sodium 135, potassium 3.5, chloride 104, bicarbonate 24, BUN 10, creatinine 0.72 Lipase 23 Initial troponin 6, second troponin 6. CBC revealing white blood cell count of 0.4, hemoglobin 10.5, hematocrit 31.9, platelet count of 202. CXR:  Chest X-ray was personally reviewed.  No evidence of focal infiltrates.  No evidence of pleural effusion.  No evidence of pneumothorax.  CT  pulmonary angiogram negative for pulmonary embolus.  Chronic thrombosis of right innominate vein with numerous chest wall collaterals noted. CT imaging of the abdomen and pelvis with contrast revealing long segment infectious or inflammatory colitis involving the distal descending and sigmoid colon with no evidence of obstruction perforation or abscess.  EKG: Personally reviewed.  Rhythm is normal sinus rhythm with heart rate of 97 bpm.  No dynamic ST segment changes appreciated.   Assessment and Plan: * Convulsive syncope Multiple episodes of witnessed loss of consciousness followed by shaking that the patient has been perceived as "seizures"  This activity seems to be occurring after rising from a seated position suggestive of syncope and in the absence of tongue biting or postictal state patient most likely episodes of convulsive syncope. Hydrating patient with intravenous isotonic fluids considering patient's severely volume depleted state Hold home regimen of antihypertensives Pending orthostatic vital signs Monitoring patient on telemetry Cardiac enzymes unremarkable Recent echocardiogram in 01/2022 and therefore this will not be repeated Will obtain an EEG to be sure there is no evidence of epileptiform activity  Chemotherapy induced neutropenia (HCC) Severe neutropenia status post chemotherapy on 8/4 Considering concurrent evidence of colitis and diarrhea on CT imaging, empirically treating with intravenous antibiotic therapy for now Dr. Lindi Adie with oncology has been added to the treatment team, anticipate they will follow along and appreciate their input in managing the neutropenia Supportive care otherwise Neutropenic precautions  Acute colitis Evidence of colonic wall thickening of the descending and sigmoid colon Considering severe neutropenia, treating empirically for possible infectious cause with intravenous cefepime and metronidazole Will obtain stool studies including GI  pathogen panel and C. difficile testing Antibiotics can likely be discontinued if infectious workup is negative and severe neutropenia resolves Considering the timing of the patient's symptoms in relation to patient's chemotherapy this is most likely a chemotherapy induced side effect and not infectious colitis with docetaxel being the most common culprit of causing enterocolitis If symptoms fail to rapidly improve typhlitis must be considered Hydrated with intravenous isotonic fluids as noted above Monitor for symptomatic improvement  Lactic acidosis Lactic acidosis likely secondary to severe volume depletion Hydrate patient intravenous isotonic fluids while concurrently treating any potential sources of underlying infection Monitoring serial lactic acid levels to ensure downtrending and resolution.   Oral mucositis Treating symptomatically with Magic mouthwash Supportive care otherwise, monitor for symptomatic improvement.  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 Lindi Adie with oncology who has been active to the treatment team to follow along, their input is appreciated Patient received first round of docetaxel, trastuzumab and pertuzumab on 8/4  Essential hypertension Holding antihypertensive therapy considering severe volume depletion Will reinitiate oral antihypertensives as blood pressure tolerates As needed intravenous antihypertensives for markedly elevated blood pressure.  Gastroesophageal reflux disease Continuing home regimen of daily PPI therapy.        Code Status:  Full code   code status decision has been confirmed with: patient Family Communication: deferred   Consults: Dr. Lindi Adie with Oncology placed on treatment team.    Severity of Illness:  The appropriate patient status for this patient is OBSERVATION. Observation status is judged to be reasonable and necessary in order to provide the required intensity of service to ensure the patient's safety. The patient's presenting symptoms, physical exam findings, and initial radiographic and laboratory data in the context of their medical condition is felt to place them at decreased risk for further clinical deterioration. Furthermore, it is anticipated that the patient will be medically stable for discharge from the hospital within 2 midnights of admission.   Author:  Vernelle Emerald MD  05/12/2022 8:28 AM

## 2022-05-12 NOTE — Progress Notes (Signed)
Pharmacy Antibiotic Note  Allison Pierce is a 76 y.o. female admitted on 05/11/2022 with  colitis and neutropenia .  Pharmacy has been consulted for cefepime dosing.  PMH significant for breast cancer s/p initial round of chemotherapy on 05/05/22.   Today, 05/12/22 ANC 0.1, WBC 0.4 Afebrile Lactate elevated SCr WNL, CrCl 55 mL/min  Plan: Cefepime 2 g IV q12h + metronidazole Follow renal function and culture data  Height: '5\' 6"'$  (167.6 cm) Weight: 70.8 kg (156 lb) IBW/kg (Calculated) : 59.3  Temp (24hrs), Avg:99.2 F (37.3 C), Min:98.7 F (37.1 C), Max:100 F (37.8 C)  Recent Labs  Lab 05/05/22 1035 05/09/22 1348 05/12/22 0009 05/12/22 0010 05/12/22 0230  WBC  --  3.6* 0.4*  --   --   CREATININE 1.00 0.81 0.72  --   --   LATICACIDVEN  --   --   --  1.8 2.3*    Estimated Creatinine Clearance: 55.1 mL/min (by C-G formula based on SCr of 0.72 mg/dL).    Allergies  Allergen Reactions   Gadolinium Derivatives Itching and Other (See Comments)    Redness and itching, skins feels like it was burning. In the future pt will need pre med. Redness and itching, skins feels like it was burning. In the future pt will need pre med.   Atorvastatin Calcium Other (See Comments)   Azelaic Acid Other (See Comments)   Fluoxetine Other (See Comments)   Oxybutynin Other (See Comments)   Prozac [Fluoxetine Hcl] Other (See Comments)    "COULDN'T LIFT HEAD AND GET OUT OF BED"   Ritalin [Methylphenidate] Other (See Comments)   Rosuvastatin Other (See Comments)   Lyrica [Pregabalin] Anxiety    "FELT WEIRD"    Antimicrobials this admission: cefepime 8/11 >>  metronidazole 8/11 >>   Dose adjustments this admission:  Microbiology results: 8/11 BCx: Sent 8/11 GI Panel: Sent 8/11 C.diff: Sent  Lenis Noon, PharmD 05/12/2022 9:24 AM

## 2022-05-12 NOTE — Assessment & Plan Note (Signed)
   Severe neutropenia status post chemotherapy on 8/4  Considering concurrent evidence of colitis and diarrhea on CT imaging, empirically treating with intravenous antibiotic therapy for now  Dr. Lindi Adie with oncology has been added to the treatment team, anticipate they will follow along and appreciate their input in managing the neutropenia  Supportive care otherwise  Neutropenic precautions

## 2022-05-12 NOTE — Assessment & Plan Note (Signed)
   Treating symptomatically with Magic mouthwash  Supportive care otherwise, monitor for symptomatic improvement.

## 2022-05-12 NOTE — Assessment & Plan Note (Signed)
   Lactic acidosis likely secondary to severe volume depletion  Hydrate patient intravenous isotonic fluids while concurrently treating any potential sources of underlying infection  Monitoring serial lactic acid levels to ensure downtrending and resolution.

## 2022-05-12 NOTE — Progress Notes (Signed)
EEG complete - results pending 

## 2022-05-12 NOTE — Assessment & Plan Note (Signed)
   Evidence of colonic wall thickening of the descending and sigmoid colon  Considering severe neutropenia, treating empirically for possible infectious cause with intravenous cefepime and metronidazole  Will obtain stool studies including GI pathogen panel and C. difficile testing  Antibiotics can likely be discontinued if infectious workup is negative and severe neutropenia resolves  Considering the timing of the patient's symptoms in relation to patient's chemotherapy this is most likely a chemotherapy induced side effect and not infectious colitis with docetaxel being the most common culprit of causing enterocolitis  If symptoms fail to rapidly improve typhlitis must be considered  Hydrated with intravenous isotonic fluids as noted above  Monitor for symptomatic improvement

## 2022-05-12 NOTE — Assessment & Plan Note (Signed)
.   No clinical evidence of cardiogenic volume overload . Monitor for any evidence of developing volume overload as we hydrate the patient intravenously . Strict input and output monitoring . Daily weights . Low-sodium diet

## 2022-05-12 NOTE — Assessment & Plan Note (Signed)
   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 Lindi Adie with oncology who has been active to the treatment team to follow along, their input is appreciated  Patient received first round of docetaxel, trastuzumab and pertuzumab on 8/4

## 2022-05-12 NOTE — Procedures (Signed)
Patient Name: Allison Pierce  MRN: [id number]  Epilepsy Attending: Lora Havens  Referring Physician/Provider: Vernelle Emerald, MD  Date: 05/12/2022 Duration: 22.07 mins  Patient history: 76yo F with multiple episodes of witnessed loss of consciousness followed by shaking that the patient has been perceived as "seizures". EEG to evaluate for seizure.  Level of alertness: Awake, asleep  AEDs during EEG study: Clonazepam  Technical aspects: This EEG study was done with scalp electrodes positioned according to the 10-20 International system of electrode placement. Electrical activity was reviewed with band pass filter of 1-'70Hz'$ , sensitivity of 7 uV/mm, display speed of 74m/sec with a '60Hz'$  notched filter applied as appropriate. EEG data were recorded continuously and digitally stored.  Video monitoring was available and reviewed as appropriate.  Description: The posterior dominant rhythm consists of 9-10 Hz activity of moderate voltage (25-35 uV) seen predominantly in posterior head regions, symmetric and reactive to eye opening and eye closing. Sleep was characterized by vertex waves, maximal frontocentral region. Hyperventilation and photic stimulation were not performed.     IMPRESSION: This study is within normal limits. No seizures or epileptiform discharges were seen throughout the recording.  A normal interictal EEG does not exclude nor support the diagnosis of epilepsy.   Sabah Zucco OBarbra Sarks

## 2022-05-12 NOTE — Progress Notes (Signed)
Patient admitted early this morning for possible convulsive syncope along with acute colitis and chemotherapy-induced neutropenia and has been started on IV fluids and antibiotics.  Patient seen and examined at bedside and plan of care discussed with her and family members at bedside.  Continue IV fluids and antibiotics.  Follow EEG.  Follow oncology input.

## 2022-05-12 NOTE — Assessment & Plan Note (Signed)
   Multiple episodes of witnessed loss of consciousness followed by shaking that the patient has been perceived as "seizures"  This activity seems to be occurring after rising from a seated position suggestive of syncope and in the absence of tongue biting or postictal state patient most likely episodes of convulsive syncope.  Hydrating patient with intravenous isotonic fluids considering patient's severely volume depleted state  Hold home regimen of antihypertensives  Pending orthostatic vital signs  Monitoring patient on telemetry  Cardiac enzymes unremarkable  Recent echocardiogram in 01/2022 and therefore this will not be repeated  Will obtain an EEG to be sure there is no evidence of epileptiform activity

## 2022-05-12 NOTE — Assessment & Plan Note (Signed)
Continuing home regimen of daily PPI therapy.  

## 2022-05-12 NOTE — ED Notes (Signed)
Date and time results received: 05/12/22 3:01 AM  (use smartphrase ".now" to insert current time)  Test: Lactic Acid  Critical Value: 2.3  Name of Provider Notified: Ayesha Rumpf   Orders Received? Or Actions Taken?: Orders Received - See Orders for details

## 2022-05-12 NOTE — Progress Notes (Signed)
HEMATOLOGY-ONCOLOGY PROGRESS NOTE  SUBJECTIVE: Hospitalization for vaso vagal syncope,?  Seizures Diarrhea, dehydration, nausea and vomiting, mouth sores status post first cycle of chemotherapy with Taxotere [name] Perjeta on 05/05/2022  After the first couple of days after chemotherapy she started to feel weak and every time she was in the bathroom she felt felt dizzy and had fallen a few times.  Another time she was in the shower and the hot water made her dizzy.  She had episodes of diarrhea accompanied by nausea and her husband did everything he can to manage the symptoms.  After these episodes of syncope she would feel very lethargic for a while.  Initially she did not want to come to the hospital but later on after another bout of syncope she decided to come in last night.  She was seen in the clinic and was given IV fluids on 05/09/2022.  Oncology History  Malignant neoplasm of upper-outer quadrant of left breast in female, estrogen receptor negative ([facility])  02/10/2022 Initial Diagnosis   Palpable left breast masses and calcifications: 1.8 cm at 1:00 and 1.7 cm at 9:00, calcifications not biopsy.  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 Report date is 03/05/2022.  The CustomNext-Cancer+RNAinsight panel offered by [facility] includes sequencing and rearrangement analysis for the following 48 genes:  APC, ATM, AXIN2, BARD1, BMPR1A, BRCA1, BRCA2, BRIP1, CDH1, CDK4, CDKN2A, CHEK2, CTNNA1, DICER1, EGFR, EPCAM, GREM1, HOXB13, KIT, MEN1, MLH1, MSH2, MSH3, MSH6, MUTYH, NBN, NF1, NTHL1, PALB2, PDGFRA,  PMS2, POLD1, POLE, PTEN, RAD50, RAD51C, RAD51D, SDHA, SDHB, SDHC, SDHD, SMAD4, SMARCA4, STK11, TP53, TSC1, TSC2, and VHL.  RNA data is routinely analyzed for use in variant interpretation for all genes.   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, LCIS, 5/5 lymph nodes positive with extranodal extension, margins negative ER 0%, PR 0%, HER2 3+, Ki-67 20%   04/27/2022 Cancer Staging   Staging form: Breast, AJCC 8th Edition - Pathologic: Stage IIIA (pT2, pN2, cM0, G2, ER-, PR-, HER2+) - Signed by Nicholas Lose, MD on 04/27/2022 Histologic grading system: 3 grade system   05/05/2022 -  Chemotherapy   Patient is on Treatment Plan : BREAST DOCEtaxel + Trastuzumab + Pertuzumab (THP) q21d x 8 cycles / Trastuzumab + Pertuzumab q21d x 4 cycles       OBJECTIVE: REVIEW OF SYSTEMS:   Mouth sores Nausea vomiting and diarrhea Severe fatigue and generalized weakness  All other systems were reviewed with the patient and are negative.  I have reviewed the past medical history, past surgical history, social history and family history with the patient and they are unchanged from previous note.   PHYSICAL EXAMINATION: ECOG PERFORMANCE STATUS: 3 - Symptomatic, >50% confined to bed  Vitals:   05/12/22 0904 05/12/22 1130  BP:  (!) 170/87  Pulse:  (!) 102  Resp:  (!) 28  Temp: 100 F (37.8 C)   SpO2:  96%   Filed Weights   05/11/22 2357  Weight: 156 lb (70.8 kg)      LABORATORY DATA:  I have reviewed the data as  listed    Latest Ref Rng & Units 05/12/2022   12:09 AM 05/09/2022    1:48 PM 05/05/2022   10:35 AM  CMP  Glucose 70 - 99 mg/dL 114  113  97   BUN 8 - 23 mg/dL '10  13  15   ' Creatinine 0.44 - 1.00 mg/dL 0.72  0.81  1.00   Sodium 135 - 145 mmol/L 135  134  134   Potassium 3.5 - 5.1 mmol/L 3.5  4.2  4.0   Chloride 98 - 111 mmol/L 104  103  101   CO2 22 - 32 mmol/L '24  27  27   ' Calcium 8.9 - 10.3 mg/dL 8.4  8.6  9.2   Total Protein 6.5 -  8.1 g/dL 6.1  6.2  6.7   Total Bilirubin 0.3 - 1.2 mg/dL 0.9  0.7  0.5   Alkaline Phos 38 - 126 U/L 69  74  87   AST 15 - 41 U/L '29  30  16   ' ALT 0 - 44 U/L 35  33  9     Lab Results  Component Value Date   WBC 0.4 (LL) 05/12/2022   HGB 10.5 (L) 05/12/2022   HCT 31.9 (L) 05/12/2022   MCV 86.7 05/12/2022   PLT 202 05/12/2022   NEUTROABS 0.1 (LL) 05/12/2022    ASSESSMENT AND PLAN: Metastatic breast cancer status post first cycle of chemotherapy with Taxotere [name] and Perjeta. Toxicities: Syncopal attacks multiple Nausea vomiting: Related to Taxotere Diarrhea: Related to Perjeta CT scan suggestive of colitis: With neutropenia she is currently being treated with cefepime and metronidazole 5.  Neutropenia: Daily Neupogen injections until [place] is greater than 1000 6.  Mouth sores: Given Magic mouthwash  Future treatment plan: We discussed about discontinuing Taxotere and Perjeta and maintaining her on [name].  Syncope: Patient is getting EEG evaluation to make sure this is not a seizure episode.  Apparently she had a few episodes prior to chemotherapy as well.

## 2022-05-12 NOTE — ED Notes (Signed)
Pt in CT.

## 2022-05-12 NOTE — ED Provider Notes (Signed)
[facility] DEPT Provider Note   CSN: [id number] Arrival date & time: 05/11/22  2347     History  Chief Complaint  Patient presents with   Emesis    Allison Pierce is a 76 y.o. female.  The history is provided by the patient, the EMS personnel, medical records and the spouse.  Emesis Allison Pierce is a 76 y.o. female who presents to the Emergency Department complaining of vomiting.  She presents to the emergency department by EMS for evaluation of emesis that started today.  She is currently undergoing chemotherapy for breast cancer, last treatment was 1 week ago.  She states she has not felt well since chemo and developed diarrhea yesterday with vomiting today.  She states that she continues to experience passing out spells that she describes as seizures.  Today she developed central chest pain.  She cannot describe the seizures in greater detail.  No fevers.  No hematochezia or melena.  No shortness of breath or abdominal pain.  Additional hx available from patient's husband after her initial arrival.  "Seizure" when she sits on the toilet, head back, twitching face, stiff for 45 seconds and becomes limp and he assists her to the ground.  She can communicate within 1.5-2 minutes.  Two episodes today.   These episodes only occur when she attempts to have a BM.      Home Medications Prior to Admission medications   Medication Sig Start Date End Date Taking? Authorizing Provider  B Complex-C (B COMPLEX-VITAMIN C) CAPS Take 1 capsule by mouth daily.   Yes [provider]  buPROPion (WELLBUTRIN XL) 300 MG 24 hr tablet Take 300 mg by mouth every morning.   Yes [provider]  calcium carbonate (TUMS - DOSED IN MG ELEMENTAL CALCIUM) 500 MG chewable tablet Chew 1 tablet by mouth daily as needed for indigestion or heartburn.   Yes [provider]  cholecalciferol (VITAMIN D3) 25 MCG (1000 UNIT) tablet Take 1,000  Units by mouth daily.   Yes [provider]  clonazePAM (KLONOPIN) 1 MG tablet Take 1 mg by mouth at bedtime.   Yes [provider]  dexamethasone (DECADRON) 4 MG tablet Take 1 tablet (4 mg total) by mouth daily. Take 1 tablet day before chemo and 1 tablet day after chemo with food 04/27/22  Yes Nicholas Lose, MD  fluticasone (FLONASE) 50 MCG/ACT nasal spray Place 1 spray into both nostrils daily as needed for allergies.   Yes [provider]  lidocaine-prilocaine (EMLA) cream Apply to affected area once 04/27/22  Yes Nicholas Lose, MD  losartan (COZAAR) 25 MG tablet Take 50 mg by mouth daily. 01/13/22  Yes [provider]  meloxicam (MOBIC) 15 MG tablet Take 15 mg by mouth daily.   Yes [provider]  Potassium 95 MG TABS Take 95 mg by mouth daily.   Yes [provider]  traZODone (DESYREL) 150 MG tablet Take 225 mg by mouth at bedtime.   Yes [provider]  venlafaxine XR (EFFEXOR-XR) 75 MG 24 hr capsule Take 75 mg by mouth daily with breakfast.   Yes [provider]  vitamin B-12 (CYANOCOBALAMIN) 500 MCG tablet Take 500 mcg by mouth daily.   Yes [provider]  ondansetron (ZOFRAN) 8 MG tablet Take 1 tablet (8 mg total) by mouth 2 (two) times daily as needed for refractory nausea / vomiting. Patient not taking: Reported on 05/12/2022 04/27/22   Nicholas Lose, MD  prochlorperazine (COMPAZINE)  10 MG tablet Take 1 tablet (10 mg total) by mouth every 6 (six) hours as needed (Nausea or vomiting). Patient not taking: Reported on 05/12/2022 04/27/22   Nicholas Lose, MD      Allergies    Gadolinium derivatives, Atorvastatin calcium, Azelaic acid, Fluoxetine, Oxybutynin, Prozac [fluoxetine hcl], Ritalin [methylphenidate], Rosuvastatin, and Lyrica [pregabalin]    Review of Systems   Review of Systems  Gastrointestinal:  Positive for vomiting.  All other systems reviewed and are negative.   Physical Exam Updated Vital  Signs BP (!) 164/112   Pulse (!) 102   Temp 98.7 F (37.1 C)   Resp 20   Ht '5\' 6"'$  (1.676 m)   Wt 70.8 kg   SpO2 98%   BMI 25.18 kg/m  Physical Exam Vitals and nursing note reviewed.  Constitutional:      Appearance: She is well-developed.  HENT:     Head: Normocephalic and atraumatic.     Comments: Dry mucous membranes with ulcerations to the tongue.  Healing ecchymosis to the left maxillary region Cardiovascular:     Rate and Rhythm: Regular rhythm. Tachycardia present.     Heart sounds: No murmur heard. Pulmonary:     Effort: Pulmonary effort is normal. No respiratory distress.     Breath sounds: Normal breath sounds.  Abdominal:     Palpations: Abdomen is soft.     Tenderness: There is no guarding or rebound.     Comments: Mild lower abdominal tenderness  Musculoskeletal:        General: No tenderness.  Skin:    General: Skin is warm and dry.  Neurological:     Mental Status: She is alert and oriented to person, place, and time.     Comments: 5 out of 5 strength in all 4 extremities  Psychiatric:        Behavior: Behavior normal.      ED Results / Procedures / Treatments   Labs (all labs ordered are listed, but only abnormal results are displayed) Labs Reviewed  COMPREHENSIVE METABOLIC PANEL - Abnormal; Notable for the following components:      Result Value   Glucose, Bld 114 (*)    Calcium 8.4 (*)    Total Protein 6.1 (*)    Albumin 3.4 (*)    All other components within normal limits  CBC WITH DIFFERENTIAL/PLATELET - Abnormal; Notable for the following components:   WBC 0.4 (*)    RBC 3.68 (*)    Hemoglobin 10.5 (*)    HCT 31.9 (*)    Neutro Abs 0.1 (*)    Lymphs Abs 0.2 (*)    All other components within normal limits  URINALYSIS, ROUTINE W REFLEX MICROSCOPIC - Abnormal; Notable for the following components:   APPearance HAZY (*)    Glucose, UA 50 (*)    Hgb urine dipstick SMALL (*)    Bacteria, UA RARE (*)    All other components within normal  limits  LACTIC ACID, PLASMA - Abnormal; Notable for the following components:   Lactic Acid, Venous 2.3 (*)    All other components within normal limits  CBG MONITORING, ED - Abnormal; Notable for the following components:   Glucose-Capillary 105 (*)    All other components within normal limits  CULTURE, BLOOD (ROUTINE X 2)  CULTURE, BLOOD (ROUTINE X 2)  LIPASE, BLOOD  MAGNESIUM  LACTIC ACID, PLASMA  TROPONIN I (HIGH SENSITIVITY)  TROPONIN I (HIGH SENSITIVITY)    EKG None  Radiology CT Angio Chest  PE W/Cm &/Or Wo Cm  Result Date: 05/12/2022 CLINICAL DATA:  Pulmonary embolism (PE) suspected, high prob; Abdominal pain, acute, nonlocalized. Breast cancer. EXAM: CT ANGIOGRAPHY CHEST CT ABDOMEN AND PELVIS WITH CONTRAST TECHNIQUE: Multidetector CT imaging of the chest was performed using the standard protocol during bolus administration of intravenous contrast. Multiplanar CT image reconstructions and MIPs were obtained to evaluate the vascular anatomy. Multidetector CT imaging of the abdomen and pelvis was performed using the standard protocol during bolus administration of intravenous contrast. RADIATION DOSE REDUCTION: This exam was performed according to the departmental dose-optimization program which includes automated exposure control, adjustment of the mA and/or kV according to patient size and/or use of iterative reconstruction technique. CONTRAST:  146m OMNIPAQUE IOHEXOL 350 MG/ML SOLN COMPARISON:  04/26/2022 FINDINGS: CTA CHEST FINDINGS Cardiovascular: Adequate opacification of the pulmonary arterial tree. No intraluminal filling defect identified to suggest acute pulmonary embolism. Central pulmonary arteries are of normal caliber. No significant coronary artery calcification. Cardiac size within normal limits. No pericardial effusion. Mild atherosclerotic calcification within the thoracic aorta. No aortic aneurysm. Right internal jugular chest port is in place with its tip at the  innominate vein which is thrombosed. Contrast instilled via the right upper extremity fills numerous due chest wall collaterals, ultimately filling the azygous, left internal mammary, and central left upper extremity veins. Mediastinum/Nodes: Thyroid unremarkable. No pathologic thoracic adenopathy. Esophagus unremarkable. Small hiatal hernia. Lungs/Pleura: Lungs are clear. No pleural effusion or pneumothorax. Musculoskeletal: Right breast implant and left breast tissue expander are in place. Status post left mastectomy. Mixed lytic and sclerotic lesion within the mid body of the sternum and lytic lesions within the anterior aspect of T3 and T4 and right lateral aspect of T10 unchanged in keeping with metastatic disease. Schmorl's node within the T7 vertebral body. Known metastasis within the posterior right seventh rib is not as well visualized on this examination. Review of the MIP images confirms the above findings. CT ABDOMEN and PELVIS FINDINGS Hepatobiliary: Multiple hepatic hypodensities are again seen, stable since prior examination, the largest of which is compatible with a simple hepatic cyst. No enhancing intrahepatic mass. No intra or extrahepatic biliary ductal dilation. Pancreas: Unremarkable Spleen: Unremarkable Adrenals/Urinary Tract: Adrenal glands are unremarkable. Kidneys are normal, without renal calculi, focal lesion, or hydronephrosis. Bladder is unremarkable. Stomach/Bowel: There is long segment circumferential bowel wall thickening and pericolonic inflammatory stranding involving the distal descending and and sigmoid colon in keeping with a infectious or inflammatory colitis. Superimposed moderate sigmoid diverticulosis. The stomach, small bowel, and large bowel are otherwise unremarkable. Appendix normal. No free intraperitoneal gas or fluid. No evidence of obstruction. Vascular/Lymphatic: Moderate aortoiliac atherosclerotic calcification. No aortic aneurysm. No pathologic adenopathy within  the abdomen and pelvis. Reproductive: Status post hysterectomy. No adnexal masses. Other: No abdominal wall hernia. Musculoskeletal: Stable lytic lesion within the left S1 vertebral body in keeping with osseous metastatic disease. No acute bone abnormality. Review of the MIP images confirms the above findings. IMPRESSION: 1. No pulmonary embolism. No acute intrathoracic pathology identified. 2. 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 3. Long segment infectious or inflammatory colitis involving the distal descending and sigmoid colon. Superimposed moderate sigmoid diverticulosis. No evidence of obstruction or perforation. 4. Stable osseous metastatic disease. Electronically Signed   By: AFidela SalisburyM.D.   On: 05/12/2022 03:23   CT Abdomen Pelvis W Contrast  Result Date: 05/12/2022 CLINICAL DATA:  Pulmonary embolism (PE) suspected, high prob; Abdominal pain, acute, nonlocalized. Breast cancer. EXAM: CT  ANGIOGRAPHY CHEST CT ABDOMEN AND PELVIS WITH CONTRAST TECHNIQUE: Multidetector CT imaging of the chest was performed using the standard protocol during bolus administration of intravenous contrast. Multiplanar CT image reconstructions and MIPs were obtained to evaluate the vascular anatomy. Multidetector CT imaging of the abdomen and pelvis was performed using the standard protocol during bolus administration of intravenous contrast. RADIATION DOSE REDUCTION: This exam was performed according to the departmental dose-optimization program which includes automated exposure control, adjustment of the mA and/or kV according to patient size and/or use of iterative reconstruction technique. CONTRAST:  146m OMNIPAQUE IOHEXOL 350 MG/ML SOLN COMPARISON:  04/26/2022 FINDINGS: CTA CHEST FINDINGS Cardiovascular: Adequate opacification of the pulmonary arterial tree. No intraluminal filling defect identified to suggest acute pulmonary embolism. Central pulmonary arteries are of  normal caliber. No significant coronary artery calcification. Cardiac size within normal limits. No pericardial effusion. Mild atherosclerotic calcification within the thoracic aorta. No aortic aneurysm. Right internal jugular chest port is in place with its tip at the innominate vein which is thrombosed. Contrast instilled via the right upper extremity fills numerous due chest wall collaterals, ultimately filling the azygous, left internal mammary, and central left upper extremity veins. Mediastinum/Nodes: Thyroid unremarkable. No pathologic thoracic adenopathy. Esophagus unremarkable. Small hiatal hernia. Lungs/Pleura: Lungs are clear. No pleural effusion or pneumothorax. Musculoskeletal: Right breast implant and left breast tissue expander are in place. Status post left mastectomy. Mixed lytic and sclerotic lesion within the mid body of the sternum and lytic lesions within the anterior aspect of T3 and T4 and right lateral aspect of T10 unchanged in keeping with metastatic disease. Schmorl's node within the T7 vertebral body. Known metastasis within the posterior right seventh rib is not as well visualized on this examination. Review of the MIP images confirms the above findings. CT ABDOMEN and PELVIS FINDINGS Hepatobiliary: Multiple hepatic hypodensities are again seen, stable since prior examination, the largest of which is compatible with a simple hepatic cyst. No enhancing intrahepatic mass. No intra or extrahepatic biliary ductal dilation. Pancreas: Unremarkable Spleen: Unremarkable Adrenals/Urinary Tract: Adrenal glands are unremarkable. Kidneys are normal, without renal calculi, focal lesion, or hydronephrosis. Bladder is unremarkable. Stomach/Bowel: There is long segment circumferential bowel wall thickening and pericolonic inflammatory stranding involving the distal descending and and sigmoid colon in keeping with a infectious or inflammatory colitis. Superimposed moderate sigmoid diverticulosis. The  stomach, small bowel, and large bowel are otherwise unremarkable. Appendix normal. No free intraperitoneal gas or fluid. No evidence of obstruction. Vascular/Lymphatic: Moderate aortoiliac atherosclerotic calcification. No aortic aneurysm. No pathologic adenopathy within the abdomen and pelvis. Reproductive: Status post hysterectomy. No adnexal masses. Other: No abdominal wall hernia. Musculoskeletal: Stable lytic lesion within the left S1 vertebral body in keeping with osseous metastatic disease. No acute bone abnormality. Review of the MIP images confirms the above findings. IMPRESSION: 1. No pulmonary embolism. No acute intrathoracic pathology identified. 2. 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 3. Long segment infectious or inflammatory colitis involving the distal descending and sigmoid colon. Superimposed moderate sigmoid diverticulosis. No evidence of obstruction or perforation. 4. Stable osseous metastatic disease. Electronically Signed   By: AFidela SalisburyM.D.   On: 05/12/2022 03:23   CT Head Wo Contrast  Result Date: 05/12/2022 CLINICAL DATA:  Head trauma, minor (Age >= 65y) syncope with head injury, questionable seizure EXAM: CT HEAD WITHOUT CONTRAST TECHNIQUE: Contiguous axial images were obtained from the base of the skull through the vertex without intravenous contrast. RADIATION DOSE REDUCTION: This exam was  performed according to the departmental dose-optimization program which includes automated exposure control, adjustment of the mA and/or kV according to patient size and/or use of iterative reconstruction technique. COMPARISON:  Brain MRI 09/29/2021, head CT 09/16/2020 FINDINGS: Brain: No intracranial hemorrhage, mass effect, or midline shift. Stable age related atrophy. No hydrocephalus. The basilar cisterns are patent. Mild periventricular chronic small vessel ischemia. No evidence of territorial infarct or acute ischemia. No extra-axial or  intracranial fluid collection. Vascular: Atherosclerosis of skullbase vasculature without hyperdense vessel or abnormal calcification. Skull: No fracture or focal lesion. Sinuses/Orbits: Paranasal sinuses and mastoid air cells are clear. The visualized orbits are unremarkable. Bilateral lens extraction. Other: None. IMPRESSION: 1. No acute intracranial abnormality. No skull fracture. 2. Stable age related atrophy and chronic small vessel ischemia. Electronically Signed   By: Keith Rake M.D.   On: 05/12/2022 01:37   DG Chest Port 1 View  Result Date: 05/12/2022 CLINICAL DATA:  Breast cancer patient, last chemotherapy 1 week ago, nausea and vomiting today with chest pain. EXAM: PORTABLE CHEST 1 VIEW COMPARISON:  Portable chest 09/16/2020, CT chest with contrast 04/26/2022. FINDINGS: Again noted are bilateral breast implants with a tissue expander in place on the left, left axillary surgical clips and a right IJ port catheter with tip at the brachiocephalic/SVC junction. The lungs are clear. No pleural effusion is seen. The cardiomediastinal silhouette is normal apart from slight aortic tortuosity, and there is aortic atherosclerosis. Thoracic spondylosis. IMPRESSION: No evidence of acute chest disease or interval changes. Electronically Signed   By: Telford Nab M.D.   On: 05/12/2022 00:29    Procedures Procedures   CRITICAL CARE Performed by: Quintella Reichert   Total critical care time: 40 minutes  Critical care time was exclusive of separately billable procedures and treating other patients.  Critical care was necessary to treat or prevent imminent or life-threatening deterioration.  Critical care was time spent personally by me on the following activities: development of treatment plan with patient and/or surrogate as well as nursing, discussions with consultants, evaluation of patient's response to treatment, examination of patient, obtaining history from patient or surrogate, ordering and  performing treatments and interventions, ordering and review of laboratory studies, ordering and review of radiographic studies, pulse oximetry and re-evaluation of patient's condition.  Medications Ordered in ED Medications  magic mouthwash (10 mLs Oral Given 05/12/22 0455)  metroNIDAZOLE (FLAGYL) IVPB 500 mg (500 mg Intravenous New Bag/Given 05/12/22 0439)  sodium chloride 0.9 % bolus 1,000 mL (0 mLs Intravenous Stopped 05/12/22 0440)  ceFEPIme (MAXIPIME) 2 g in sodium chloride 0.9 % 100 mL IVPB (0 g Intravenous Stopped 05/12/22 0310)  sodium chloride (PF) 0.9 % injection (10 mLs  Given 05/12/22 0239)  iohexol (OMNIPAQUE) 350 MG/ML injection 100 mL (100 mLs Intravenous Contrast Given 05/12/22 0246)    ED Course/ Medical Decision Making/ A&P                           Medical Decision Making Amount and/or Complexity of Data Reviewed Labs: ordered. Radiology: ordered.  Risk Prescription drug management. Decision regarding hospitalization.   Patient with recent diagnosis of breast cancer, received chemotherapy 1 week ago here for evaluation of diarrhea for 2 days and vomiting today.  She is ill-appearing and dehydrated appearing on evaluation with ulcerations in her mouth.  On initial examination she had no significant tenderness on her abdomen but on repeat evaluation she did have some lower abdominal tenderness.  She is  not neutropenic today.  She was started with IV fluids, antiemetic and mouthwash for her ulcerations.  She was started on empiric antibiotics given her neutropenia.  CT abdomen pelvis is significant for colitis.  She does have some skin changes to the left breast, unclear if there is local cellulitis.  Given her recurrent syncopal events, neutropenia and colitis plan to admit for ongoing monitoring and treatment.  Medicine consulted for admission.  Patient and husband updated of findings of studies and recommendation for admission and they are in agreement with treatment  plan.        Final Clinical Impression(s) / ED Diagnoses Final diagnoses:  Colitis  Vasovagal syncope  Chemotherapy-induced neutropenia [facility])    Rx / DC Orders ED Discharge Orders     None         Quintella Reichert, MD 05/12/22 [phone]

## 2022-05-12 NOTE — ED Triage Notes (Signed)
Husband states he thinks she is having "focal seizures".  Pt states "I have been passing out".

## 2022-05-12 NOTE — Assessment & Plan Note (Addendum)
   Holding antihypertensive therapy considering severe volume depletion  Will reinitiate oral antihypertensives as blood pressure tolerates  As needed intravenous antihypertensives for markedly elevated blood pressure.

## 2022-05-13 DIAGNOSIS — B028 Zoster with other complications: Secondary | ICD-10-CM | POA: Diagnosis not present

## 2022-05-13 DIAGNOSIS — E785 Hyperlipidemia, unspecified: Secondary | ICD-10-CM | POA: Diagnosis present

## 2022-05-13 DIAGNOSIS — K1230 Oral mucositis (ulcerative), unspecified: Secondary | ICD-10-CM | POA: Diagnosis present

## 2022-05-13 DIAGNOSIS — E876 Hypokalemia: Secondary | ICD-10-CM | POA: Diagnosis present

## 2022-05-13 DIAGNOSIS — R531 Weakness: Secondary | ICD-10-CM | POA: Diagnosis present

## 2022-05-13 DIAGNOSIS — K219 Gastro-esophageal reflux disease without esophagitis: Secondary | ICD-10-CM | POA: Diagnosis present

## 2022-05-13 DIAGNOSIS — F32A Depression, unspecified: Secondary | ICD-10-CM | POA: Diagnosis present

## 2022-05-13 DIAGNOSIS — K5289 Other specified noninfective gastroenteritis and colitis: Secondary | ICD-10-CM | POA: Diagnosis present

## 2022-05-13 DIAGNOSIS — Z171 Estrogen receptor negative status [ER-]: Secondary | ICD-10-CM | POA: Diagnosis not present

## 2022-05-13 DIAGNOSIS — R197 Diarrhea, unspecified: Secondary | ICD-10-CM | POA: Diagnosis not present

## 2022-05-13 DIAGNOSIS — R112 Nausea with vomiting, unspecified: Secondary | ICD-10-CM | POA: Diagnosis not present

## 2022-05-13 DIAGNOSIS — Z9012 Acquired absence of left breast and nipple: Secondary | ICD-10-CM | POA: Diagnosis not present

## 2022-05-13 DIAGNOSIS — C50912 Malignant neoplasm of unspecified site of left female breast: Secondary | ICD-10-CM | POA: Diagnosis not present

## 2022-05-13 DIAGNOSIS — E872 Acidosis, unspecified: Secondary | ICD-10-CM | POA: Diagnosis present

## 2022-05-13 DIAGNOSIS — I10 Essential (primary) hypertension: Secondary | ICD-10-CM | POA: Diagnosis not present

## 2022-05-13 DIAGNOSIS — D701 Agranulocytosis secondary to cancer chemotherapy: Secondary | ICD-10-CM | POA: Diagnosis present

## 2022-05-13 DIAGNOSIS — E869 Volume depletion, unspecified: Secondary | ICD-10-CM | POA: Diagnosis present

## 2022-05-13 DIAGNOSIS — C50412 Malignant neoplasm of upper-outer quadrant of left female breast: Secondary | ICD-10-CM | POA: Diagnosis present

## 2022-05-13 DIAGNOSIS — T451X5A Adverse effect of antineoplastic and immunosuppressive drugs, initial encounter: Secondary | ICD-10-CM | POA: Diagnosis present

## 2022-05-13 DIAGNOSIS — K529 Noninfective gastroenteritis and colitis, unspecified: Secondary | ICD-10-CM | POA: Diagnosis not present

## 2022-05-13 DIAGNOSIS — D6489 Other specified anemias: Secondary | ICD-10-CM | POA: Diagnosis present

## 2022-05-13 DIAGNOSIS — B029 Zoster without complications: Secondary | ICD-10-CM | POA: Diagnosis present

## 2022-05-13 DIAGNOSIS — I5032 Chronic diastolic (congestive) heart failure: Secondary | ICD-10-CM | POA: Diagnosis present

## 2022-05-13 DIAGNOSIS — G4089 Other seizures: Secondary | ICD-10-CM | POA: Diagnosis present

## 2022-05-13 DIAGNOSIS — R55 Syncope and collapse: Secondary | ICD-10-CM | POA: Diagnosis not present

## 2022-05-13 DIAGNOSIS — I110 Hypertensive heart disease with heart failure: Secondary | ICD-10-CM | POA: Diagnosis present

## 2022-05-13 LAB — COMPREHENSIVE METABOLIC PANEL
ALT: 27 U/L (ref 0–44)
AST: 19 U/L (ref 15–41)
Albumin: 2.9 g/dL — ABNORMAL LOW (ref 3.5–5.0)
Alkaline Phosphatase: 63 U/L (ref 38–126)
Anion gap: 8 (ref 5–15)
BUN: 7 mg/dL — ABNORMAL LOW (ref 8–23)
CO2: 24 mmol/L (ref 22–32)
Calcium: 8.4 mg/dL — ABNORMAL LOW (ref 8.9–10.3)
Chloride: 105 mmol/L (ref 98–111)
Creatinine, Ser: 0.74 mg/dL (ref 0.44–1.00)
GFR, Estimated: >60 mL/min (ref >60)
Glucose, Bld: 122 mg/dL — ABNORMAL HIGH (ref 70–99)
Potassium: 3.5 mmol/L (ref 3.5–5.1)
Sodium: 137 mmol/L (ref 135–145)
Total Bilirubin: 0.7 mg/dL (ref 0.3–1.2)
Total Protein: 5.7 g/dL — ABNORMAL LOW (ref 6.5–8.1)

## 2022-05-13 LAB — MAGNESIUM: Magnesium: 1.7 mg/dL (ref 1.7–2.4)

## 2022-05-13 MED ORDER — SODIUM CHLORIDE 0.9 % IV SOLN
16.0000 mg | Freq: Three times a day (TID) | INTRAVENOUS | Status: DC
  Administered 2022-05-13 – 2022-05-14 (×2): 16 mg via INTRAVENOUS
  Filled 2022-05-13 (×4): qty 8

## 2022-05-13 MED ORDER — PROCHLORPERAZINE EDISYLATE 10 MG/2ML IJ SOLN
10.0000 mg | Freq: Once | INTRAMUSCULAR | Status: AC
Start: 2022-05-13 — End: 2022-05-13
  Administered 2022-05-13: 10 mg via INTRAVENOUS
  Filled 2022-05-13: qty 2

## 2022-05-13 MED ORDER — LOSARTAN POTASSIUM 50 MG PO TABS
50.0000 mg | ORAL_TABLET | Freq: Every day | ORAL | Status: DC
  Administered 2022-05-13 – 2022-05-18 (×6): 50 mg via ORAL
  Filled 2022-05-13 (×6): qty 1

## 2022-05-13 MED ORDER — SODIUM CHLORIDE 0.9 % IV SOLN: INTRAVENOUS | Status: DC

## 2022-05-13 NOTE — Evaluation (Signed)
Physical Therapy Evaluation Patient Details Name: Allison Pierce MRN: [id number] DOB: Jan 30, 1946 Today's Date: 05/13/2022  History of Present Illness  76 yo female admitted with syncope, colitis, chemo induced neutropenia. Hx of falls, lymphedema, CHF, met breast cancer s/p mastectomy  Clinical Impression  On eval, pt was Min guard A for mobility. She walked ~75 feet with a RW. Pt presents with general weakness and fatigue. RW used for ambulation safety on today. Pt did not c/o dizziness during session. Discussed d/c plan-pt plans to return home where she lives with her husband. She politely declines PT f/u at this time.        Recommendations for follow up therapy are one component of a multi-disciplinary discharge planning process, led by the attending physician.  Recommendations may be updated based on patient status, additional functional criteria and insurance authorization.  Follow Up Recommendations Home health PT (pt currently politely declines PT f/u)      Assistance Recommended at Discharge PRN  Patient can return home with the following  Assistance with cooking/housework;Assist for transportation    Equipment Recommendations None recommended by PT  Recommendations for Other Services       Functional Status Assessment Patient has had a recent decline in their functional status and demonstrates the ability to make significant improvements in function in a reasonable and predictable amount of time.     Precautions / Restrictions Precautions Precautions: Fall Restrictions Weight Bearing Restrictions: No      Mobility  Bed Mobility Overal bed mobility: Modified Independent             General bed mobility comments: increased time. side to sit    Transfers Overall transfer level: Needs assistance Equipment used: Rolling walker (2 wheels) Transfers: Sit to/from Stand Sit to Stand: Min guard           General transfer comment: min guard for  safety.    Ambulation/Gait Ambulation/Gait assistance: Min guard Gait Distance (Feet): 75 Feet Assistive device: Rolling walker (2 wheels) Gait Pattern/deviations: Step-through pattern, Decreased stride length       General Gait Details: min guard for safety. unsteady initially. slow gait speed.  Stairs            Wheelchair Mobility    Modified Rankin (Stroke Patients Only)       Balance Overall balance assessment: Needs assistance, History of Falls         Standing balance support: Bilateral upper extremity supported, During functional activity, Reliant on assistive device for balance Standing balance-Leahy Scale: Fair                               Pertinent Vitals/Pain Pain Assessment Pain Assessment: No/denies pain    Home Living Family/patient expects to be discharged to:: Private residence Living Arrangements: Spouse/significant other Available Help at Discharge: Family Type of Home: House Home Access: Stairs to enter   [employer] of Steps: 2   Home Layout: Multi-level;Able to live on main level with bedroom/bathroom Home Equipment: Rollator (4 wheels)      Prior Function Prior Level of Function : Independent/Modified Independent             Mobility Comments: using rollator most recently       Hand Dominance        Extremity/Trunk Assessment   Upper Extremity Assessment Upper Extremity Assessment: Overall WFL for tasks assessed    Lower Extremity Assessment Lower Extremity Assessment: Generalized  weakness    Cervical / Trunk Assessment Cervical / Trunk Assessment: Normal  Communication   Communication: No difficulties  Cognition Arousal/Alertness: Awake/alert Behavior During Therapy: WFL for tasks assessed/performed Overall Cognitive Status: Within Functional Limits for tasks assessed                                          General Comments      Exercises      Assessment/Plan    PT Assessment Patient needs continued PT services  PT Problem List Decreased strength;Decreased mobility;Decreased activity tolerance;Decreased balance;Decreased knowledge of use of DME       PT Treatment Interventions DME instruction;Gait training;Therapeutic activities;Balance training;Functional mobility training;Therapeutic exercise;Patient/family education    PT Goals (Current goals can be found in the Care Plan section)  Acute Rehab PT Goals Patient Stated Goal: none stated PT Goal Formulation: With patient Time For Goal Achievement: 05/27/22 Potential to Achieve Goals: Good    Frequency Min 3X/week     Co-evaluation               AM-PAC PT "6 Clicks" Mobility  Outcome Measure Help needed turning from your back to your side while in a flat bed without using bedrails?: A Little Help needed moving from lying on your back to sitting on the side of a flat bed without using bedrails?: A Little Help needed moving to and from a bed to a chair (including a wheelchair)?: A Little Help needed standing up from a chair using your arms (e.g., wheelchair or bedside chair)?: A Little Help needed to walk in hospital room?: A Little Help needed climbing 3-5 steps with a railing? : A Little 6 Click Score: 18    End of Session Equipment Utilized During Treatment: Gait belt Activity Tolerance: Patient limited by fatigue Patient left: in bed;with call bell/phone within reach;with family/visitor present;with bed alarm set   PT Visit Diagnosis: Muscle weakness (generalized) (M62.81);Unsteadiness on feet (R26.81)    Time: 0923-3007 PT Time Calculation (min) (ACUTE ONLY): 13 min   Charges:   PT Evaluation $PT Eval Low Complexity: [place], PT Acute Rehabilitation  Office: [phone] Pager: [phone]

## 2022-05-13 NOTE — Progress Notes (Addendum)
PROGRESS NOTE    Allison Pierce  CXK:[id number] DOB: 1946/02/15 DOA: 05/11/2022 PCP: [name], MD   Brief Narrative:  76 year old female with history of left breast cancer status postmastectomy, currently on chemotherapy (started on first chemotherapy on 05/05/2022), chronic diastolic heart failure, GERD, hyperlipidemia, hypertension presented with vomiting and multiple episodes of syncope with concern for seizure.  On presentation, patient was found to be volume depleted with lactic acidosis along with neutropenia.  Chest x-ray showed no evidence of any focal infiltrates.  CTA chest was negative for PE.  CT of abdomen and pelvis with contrast showed possible infectious or inflammatory colitis involving the distal descending and sigmoid colon with no obstruction or abscess.  She was started on IV fluids and antibiotics.  Assessment & Plan:   Multiple syncopal episodes -Most likely vasovagal episodes causing syncope with seizure-like episodes.  EEG negative.  No seizures since admission.  No syncope since admission. -Continue IV fluids.  PT eval. -Had recent echocardiogram in 01/2022 hence was not repeated  Chemotherapy induced severe neutropenia -WBC 0.4 on presentation.  Improving to 0.9 today.  Monitor.  Oncology following.  Currently getting Granix as per oncology. -Neutropenic precautions  Acute colitis -As seen on CT of the abdomen as well.  No diarrhea since admission.  DC stool testing. -Continue broad-spectrum antibiotics for now.  Lactic acidosis -Resolved  Oral mucositis From possible from therapy From with Magic mouthwash  Chronic diastolic CHF From compensated.  Strict input and output.  Daily weights.  Essential hypertension -Blood pressure intermittently on the higher side.  Might have to resume home antihypertensives  Lobular carcinoma of left breast -Status post left mastectomy in 04/2022.  Started chemotherapy on 05/05/2022.  Oncology  following  Depression -continue bupropion and venlafaxine along with clonazepam.  Outpatient follow-up with psychiatry/PCP  Physical deconditioning -PT eval   DVT prophylaxis: Lovenox Code Status: Full Family Communication: None at bedside Disposition Plan: Status is: Observation The patient will require care spanning > 2 midnights and should be moved to inpatient because: Of severity of illness.  For IV antibiotics.    Consultants: Oncology  Procedures: None  Antimicrobials:  Anti-infectives (From admission, onward)    Start     Dose/Rate Route Frequency Ordered Stop   05/12/22 1600  ceFEPIme (MAXIPIME) 2 g in sodium chloride 0.9 % 100 mL IVPB        2 g 200 mL/hr over 30 Minutes Intravenous Every 12 hours 05/12/22 0932     05/12/22 0330  metroNIDAZOLE (FLAGYL) IVPB 500 mg        500 mg 100 mL/hr over 60 Minutes Intravenous Every 12 hours 05/12/22 0327     05/12/22 0215  ceFEPIme (MAXIPIME) 2 g in sodium chloride 0.9 % 100 mL IVPB        2 g 200 mL/hr over 30 Minutes Intravenous  Once 05/12/22 0202 05/12/22 0310         Subjective: Patient seen and examined at bedside.  Feels little better.  Complains of mouth pain and sores.  No overnight fever, vomiting or diarrhea reported.  Objective: Vitals:   05/12/22 1923 05/12/22 2305 05/13/22 0308 05/13/22 0450  BP: (!) 156/71 (!) 144/62 (!) 143/79 (!) 159/94  Pulse: 100 (!) 107 97 (!) 103  Resp: '18 18 18 17  '$ Temp: 99.8 F (37.7 C) 98.9 F (37.2 C) 98.1 F (36.7 C) 99.3 F (37.4 C)  TempSrc: Oral Oral Oral Oral  SpO2: 97% 93% 94% 96%  Weight:  Height:        Intake/Output Summary (Last 24 hours) at 05/13/2022 1130 Last data filed at 05/13/2022 0328 Gross per 24 hour  Intake 1351.73 ml  Output --  Net 1351.73 ml   Filed Weights   05/11/22 2357  Weight: 70.8 kg    Examination:  General exam: Appears calm and comfortable.  Currently on room air. Respiratory system: Bilateral decreased breath sounds  at bases with some scattered crackles Cardiovascular system: S1 & S2 heard, intermittently tachycardic Gastrointestinal system: Abdomen is nondistended, soft and nontender. Normal bowel sounds heard. Extremities: No cyanosis, clubbing; trace lower extremity edema Central nervous system: Alert and oriented. No focal neurological deficits. Moving extremities Skin: No rashes, lesions or ulcers Psychiatry: Flat affect.  No signs of agitation.   Data Reviewed: I have personally reviewed following labs and imaging studies  CBC: Recent Labs  Lab 05/09/22 1348 05/12/22 0009 05/13/22 0603  WBC 3.6* 0.4* 0.9*  NEUTROABS 3.1 0.1* 0.1*  HGB 11.3* 10.5* 9.5*  HCT 33.3* 31.9* 28.7*  MCV 84.1 86.7 86.2  PLT 227 202 233   Basic Metabolic Panel: Recent Labs  Lab 05/09/22 1348 05/12/22 0009 05/13/22 0603  NA 134* 135 137  K 4.2 3.5 3.5  CL 103 104 105  CO2 '27 24 24  '$ GLUCOSE 113* 114* 122*  BUN 13 10 7*  CREATININE 0.81 0.72 0.74  CALCIUM 8.6* 8.4* 8.4*  MG 1.8 1.7 1.7   GFR: Estimated Creatinine Clearance: 55.1 mL/min (by C-G formula based on SCr of 0.74 mg/dL). Liver Function Tests: Recent Labs  Lab 05/09/22 1348 05/12/22 0009 05/13/22 0603  AST '30 29 19  '$ ALT 33 35 27  ALKPHOS 74 69 63  BILITOT 0.7 0.9 0.7  PROT 6.2* 6.1* 5.7*  ALBUMIN 3.8 3.4* 2.9*   Recent Labs  Lab 05/12/22 0009  LIPASE 23   No results for input(s): "AMMONIA" in the last 168 hours. Coagulation Profile: No results for input(s): "INR", "PROTIME" in the last 168 hours. Cardiac Enzymes: No results for input(s): "CKTOTAL", "CKMB", "CKMBINDEX", "TROPONINI" in the last 168 hours. BNP (last 3 results) No results for input(s): "PROBNP" in the last 8760 hours. HbA1C: No results for input(s): "HGBA1C" in the last 72 hours. CBG: Recent Labs  Lab 05/11/22 2359  GLUCAP 105*   Lipid Profile: No results for input(s): "CHOL", "HDL", "LDLCALC", "TRIG", "CHOLHDL", "LDLDIRECT" in the last 72 hours. Thyroid  Function Tests: No results for input(s): "TSH", "T4TOTAL", "FREET4", "T3FREE", "THYROIDAB" in the last 72 hours. Anemia Panel: No results for input(s): "VITAMINB12", "FOLATE", "FERRITIN", "TIBC", "IRON", "RETICCTPCT" in the last 72 hours. Sepsis Labs: Recent Labs  Lab 05/12/22 0010 05/12/22 0230 05/12/22 1025  LATICACIDVEN 1.8 2.3* 1.1    Recent Results (from the past 240 hour(s))  Culture, blood (routine x 2)     Status: None (Preliminary result)   Collection Time: 05/12/22  2:30 AM   Specimen: BLOOD  Result Value Ref Range Status   Specimen Description   Final    BLOOD BLOOD RIGHT HAND Performed at [facility], [place] [address], [place], [place] [address]    Special Requests   Final    BOTTLES DRAWN AEROBIC AND ANAEROBIC Blood Culture adequate volume Performed at [place] [address] [place], [place] [address]    Culture   Final    NO GROWTH 1 DAY Performed at [facility], [place] [address] [place], [place] [address]    Report Status PENDING  Incomplete  Culture, blood (routine x 2)     Status: None (Preliminary result)   Collection Time: May 24, 2022  2:40 AM   Specimen: BLOOD  Result Value Ref Range Status   Specimen Description   Final    BLOOD RIGHT ANTECUBITAL Performed at [facility] [address], [place], [place] [address]    Special Requests   Final    BOTTLES DRAWN AEROBIC AND ANAEROBIC Blood Culture results may not be optimal due to an excessive volume of blood received in culture bottles Performed at [facility] [place] [address] [place], [place] [address]    Culture   Final    NO GROWTH 1 DAY Performed at [facility], [place] [address], [place],  [address]    Report Status PENDING  Incomplete         Radiology Studies: EEG adult  Result Date: 05-24-2022 Lora Havens, MD     24-May-2022  4:40 PM Patient Name: Zyona Pettaway MRN:  [id number] Epilepsy Attending: Lora Havens Referring Physician/Provider: Vernelle Emerald, MD Date: 2022/05/24 Duration: 22.07 mins Patient history: 76yo F with multiple episodes of witnessed loss of consciousness followed by shaking that the patient has been perceived as "seizures". EEG to evaluate for seizure. Level of alertness: Awake, asleep AEDs during EEG study: Clonazepam Technical aspects: This EEG study was done with scalp electrodes positioned according to the 10-20 International system of electrode placement. Electrical activity was reviewed with band pass filter of 1-'70Hz'$ , sensitivity of 7 uV/mm, display speed of 67m/sec with a '60Hz'$  notched filter applied as appropriate. EEG data were recorded continuously and digitally stored.  Video monitoring was available and reviewed as appropriate. Description: The posterior dominant rhythm consists of 9-10 Hz activity of moderate voltage (25-35 uV) seen predominantly in posterior head regions, symmetric and reactive to eye opening and eye closing. Sleep was characterized by vertex waves, maximal frontocentral region. Hyperventilation and photic stimulation were not performed.   IMPRESSION: This study is within normal limits. No seizures or epileptiform discharges were seen throughout the recording. A normal interictal EEG does not exclude nor support the diagnosis of epilepsy. PLora Havens  CT Angio Chest PE W/Cm &/Or Wo Cm  Result Date: 82023/08/23CLINICAL DATA:  Pulmonary embolism (PE) suspected, high prob; Abdominal pain, acute, nonlocalized. Breast cancer. EXAM: CT ANGIOGRAPHY CHEST CT ABDOMEN AND PELVIS WITH CONTRAST TECHNIQUE: Multidetector CT imaging of the chest was performed using the standard protocol during bolus administration of intravenous contrast. Multiplanar CT image reconstructions and MIPs were obtained to evaluate the vascular anatomy. Multidetector CT imaging of the abdomen and pelvis was performed using the standard protocol  during bolus administration of intravenous contrast. RADIATION DOSE REDUCTION: This exam was performed according to the departmental dose-optimization program which includes automated exposure control, adjustment of the mA and/or kV according to patient size and/or use of iterative reconstruction technique. CONTRAST:  1040mOMNIPAQUE IOHEXOL 350 MG/ML SOLN COMPARISON:  04/26/2022 FINDINGS: CTA CHEST FINDINGS Cardiovascular: Adequate opacification of the pulmonary arterial tree. No intraluminal filling defect identified to suggest acute pulmonary embolism. Central pulmonary arteries are of normal caliber. No significant coronary artery calcification. Cardiac size within normal limits. No pericardial effusion. Mild atherosclerotic calcification within the thoracic aorta. No aortic aneurysm. Right internal jugular chest port is in place with its tip at the innominate vein which is thrombosed. Contrast instilled via the right upper extremity fills numerous due chest wall collaterals, ultimately filling the azygous, left  internal mammary, and central left upper extremity veins. Mediastinum/Nodes: Thyroid unremarkable. No pathologic thoracic adenopathy. Esophagus unremarkable. Small hiatal hernia. Lungs/Pleura: Lungs are clear. No pleural effusion or pneumothorax. Musculoskeletal: Right breast implant and left breast tissue expander are in place. Status post left mastectomy. Mixed lytic and sclerotic lesion within the mid body of the sternum and lytic lesions within the anterior aspect of T3 and T4 and right lateral aspect of T10 unchanged in keeping with metastatic disease. Schmorl's node within the T7 vertebral body. Known metastasis within the posterior right seventh rib is not as well visualized on this examination. Review of the MIP images confirms the above findings. CT ABDOMEN and PELVIS FINDINGS Hepatobiliary: Multiple hepatic hypodensities are again seen, stable since prior examination, the largest of which is  compatible with a simple hepatic cyst. No enhancing intrahepatic mass. No intra or extrahepatic biliary ductal dilation. Pancreas: Unremarkable Spleen: Unremarkable Adrenals/Urinary Tract: Adrenal glands are unremarkable. Kidneys are normal, without renal calculi, focal lesion, or hydronephrosis. Bladder is unremarkable. Stomach/Bowel: There is long segment circumferential bowel wall thickening and pericolonic inflammatory stranding involving the distal descending and and sigmoid colon in keeping with a infectious or inflammatory colitis. Superimposed moderate sigmoid diverticulosis. The stomach, small bowel, and large bowel are otherwise unremarkable. Appendix normal. No free intraperitoneal gas or fluid. No evidence of obstruction. Vascular/Lymphatic: Moderate aortoiliac atherosclerotic calcification. No aortic aneurysm. No pathologic adenopathy within the abdomen and pelvis. Reproductive: Status post hysterectomy. No adnexal masses. Other: No abdominal wall hernia. Musculoskeletal: Stable lytic lesion within the left S1 vertebral body in keeping with osseous metastatic disease. No acute bone abnormality. Review of the MIP images confirms the above findings. IMPRESSION: 1. No pulmonary embolism. No acute intrathoracic pathology identified. 2. 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 3. Long segment infectious or inflammatory colitis involving the distal descending and sigmoid colon. Superimposed moderate sigmoid diverticulosis. No evidence of obstruction or perforation. 4. Stable osseous metastatic disease. Electronically Signed   By: Fidela Salisbury M.D.   On: 05/12/2022 03:23   CT Abdomen Pelvis W Contrast  Result Date: 05/12/2022 CLINICAL DATA:  Pulmonary embolism (PE) suspected, high prob; Abdominal pain, acute, nonlocalized. Breast cancer. EXAM: CT ANGIOGRAPHY CHEST CT ABDOMEN AND PELVIS WITH CONTRAST TECHNIQUE: Multidetector CT imaging of the chest was  performed using the standard protocol during bolus administration of intravenous contrast. Multiplanar CT image reconstructions and MIPs were obtained to evaluate the vascular anatomy. Multidetector CT imaging of the abdomen and pelvis was performed using the standard protocol during bolus administration of intravenous contrast. RADIATION DOSE REDUCTION: This exam was performed according to the departmental dose-optimization program which includes automated exposure control, adjustment of the mA and/or kV according to patient size and/or use of iterative reconstruction technique. CONTRAST:  16m OMNIPAQUE IOHEXOL 350 MG/ML SOLN COMPARISON:  04/26/2022 FINDINGS: CTA CHEST FINDINGS Cardiovascular: Adequate opacification of the pulmonary arterial tree. No intraluminal filling defect identified to suggest acute pulmonary embolism. Central pulmonary arteries are of normal caliber. No significant coronary artery calcification. Cardiac size within normal limits. No pericardial effusion. Mild atherosclerotic calcification within the thoracic aorta. No aortic aneurysm. Right internal jugular chest port is in place with its tip at the innominate vein which is thrombosed. Contrast instilled via the right upper extremity fills numerous due chest wall collaterals, ultimately filling the azygous, left internal mammary, and central left upper extremity veins. Mediastinum/Nodes: Thyroid unremarkable. No pathologic thoracic adenopathy. Esophagus unremarkable. Small hiatal hernia. Lungs/Pleura: Lungs are clear. No pleural  effusion or pneumothorax. Musculoskeletal: Right breast implant and left breast tissue expander are in place. Status post left mastectomy. Mixed lytic and sclerotic lesion within the mid body of the sternum and lytic lesions within the anterior aspect of T3 and T4 and right lateral aspect of T10 unchanged in keeping with metastatic disease. Schmorl's node within the T7 vertebral body. Known metastasis within the  posterior right seventh rib is not as well visualized on this examination. Review of the MIP images confirms the above findings. CT ABDOMEN and PELVIS FINDINGS Hepatobiliary: Multiple hepatic hypodensities are again seen, stable since prior examination, the largest of which is compatible with a simple hepatic cyst. No enhancing intrahepatic mass. No intra or extrahepatic biliary ductal dilation. Pancreas: Unremarkable Spleen: Unremarkable Adrenals/Urinary Tract: Adrenal glands are unremarkable. Kidneys are normal, without renal calculi, focal lesion, or hydronephrosis. Bladder is unremarkable. Stomach/Bowel: There is long segment circumferential bowel wall thickening and pericolonic inflammatory stranding involving the distal descending and and sigmoid colon in keeping with a infectious or inflammatory colitis. Superimposed moderate sigmoid diverticulosis. The stomach, small bowel, and large bowel are otherwise unremarkable. Appendix normal. No free intraperitoneal gas or fluid. No evidence of obstruction. Vascular/Lymphatic: Moderate aortoiliac atherosclerotic calcification. No aortic aneurysm. No pathologic adenopathy within the abdomen and pelvis. Reproductive: Status post hysterectomy. No adnexal masses. Other: No abdominal wall hernia. Musculoskeletal: Stable lytic lesion within the left S1 vertebral body in keeping with osseous metastatic disease. No acute bone abnormality. Review of the MIP images confirms the above findings. IMPRESSION: 1. No pulmonary embolism. No acute intrathoracic pathology identified. 2. 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 3. Long segment infectious or inflammatory colitis involving the distal descending and sigmoid colon. Superimposed moderate sigmoid diverticulosis. No evidence of obstruction or perforation. 4. Stable osseous metastatic disease. Electronically Signed   By: Fidela Salisbury M.D.   On: 05/12/2022 03:23   CT Head Wo  Contrast  Result Date: 05/12/2022 CLINICAL DATA:  Head trauma, minor (Age >= 65y) syncope with head injury, questionable seizure EXAM: CT HEAD WITHOUT CONTRAST TECHNIQUE: Contiguous axial images were obtained from the base of the skull through the vertex without intravenous contrast. RADIATION DOSE REDUCTION: This exam was performed according to the departmental dose-optimization program which includes automated exposure control, adjustment of the mA and/or kV according to patient size and/or use of iterative reconstruction technique. COMPARISON:  Brain MRI 09/29/2021, head CT 09/16/2020 FINDINGS: Brain: No intracranial hemorrhage, mass effect, or midline shift. Stable age related atrophy. No hydrocephalus. The basilar cisterns are patent. Mild periventricular chronic small vessel ischemia. No evidence of territorial infarct or acute ischemia. No extra-axial or intracranial fluid collection. Vascular: Atherosclerosis of skullbase vasculature without hyperdense vessel or abnormal calcification. Skull: No fracture or focal lesion. Sinuses/Orbits: Paranasal sinuses and mastoid air cells are clear. The visualized orbits are unremarkable. Bilateral lens extraction. Other: None. IMPRESSION: 1. No acute intracranial abnormality. No skull fracture. 2. Stable age related atrophy and chronic small vessel ischemia. Electronically Signed   By: Keith Rake M.D.   On: 05/12/2022 01:37   DG Chest Port 1 View  Result Date: 05/12/2022 CLINICAL DATA:  Breast cancer patient, last chemotherapy 1 week ago, nausea and vomiting today with chest pain. EXAM: PORTABLE CHEST 1 VIEW COMPARISON:  Portable chest 09/16/2020, CT chest with contrast 04/26/2022. FINDINGS: Again noted are bilateral breast implants with a tissue expander in place on the left, left axillary surgical clips and a right IJ port catheter with tip at  the brachiocephalic/SVC junction. The lungs are clear. No pleural effusion is seen. The cardiomediastinal  silhouette is normal apart from slight aortic tortuosity, and there is aortic atherosclerosis. Thoracic spondylosis. IMPRESSION: No evidence of acute chest disease or interval changes. Electronically Signed   By: Telford Nab M.D.   On: 05/12/2022 00:29        Scheduled Meds:  buPROPion  300 mg Oral q morning   clonazePAM  1 mg Oral QHS   enoxaparin (LOVENOX) injection  40 mg Subcutaneous Daily   meloxicam  15 mg Oral Daily   Tbo-filgastrim (GRANIX) SQ  480 mcg Subcutaneous q1800   traZODone  225 mg Oral QHS   venlafaxine XR  75 mg Oral Q breakfast   Continuous Infusions:  ceFEPime (MAXIPIME) IV 2 g (05/13/22 0504)   metronidazole 100 mL/hr at 05/13/22 0328   ondansetron (ZOFRAN) IV 8 mg (05/13/22 0631)          Aline August, MD [facility] 05/13/2022, 11:30 AM

## 2022-05-13 NOTE — Progress Notes (Signed)
RN reports pt with increased nausea. I increased the scheduled compazine to 16 mg and kept the q 8hr schedule and have given a 1x dose of compazine.

## 2022-05-14 DIAGNOSIS — K529 Noninfective gastroenteritis and colitis, unspecified: Secondary | ICD-10-CM | POA: Diagnosis not present

## 2022-05-14 DIAGNOSIS — R55 Syncope and collapse: Secondary | ICD-10-CM | POA: Diagnosis not present

## 2022-05-14 DIAGNOSIS — R112 Nausea with vomiting, unspecified: Secondary | ICD-10-CM | POA: Diagnosis not present

## 2022-05-14 DIAGNOSIS — D701 Agranulocytosis secondary to cancer chemotherapy: Secondary | ICD-10-CM | POA: Diagnosis not present

## 2022-05-14 LAB — CBC WITH DIFFERENTIAL/PLATELET
Abs Immature Granulocytes: 0 10*3/uL (ref 0.00–0.07)
Abs Immature Granulocytes: 1.1 10*3/uL — ABNORMAL HIGH (ref 0.00–0.07)
Band Neutrophils: 5 %
Basophils Absolute: 0 10*3/uL (ref 0.0–0.1)
Basophils Absolute: 0 10*3/uL (ref 0.0–0.1)
Basophils Relative: 1 %
Basophils Relative: 1 %
Eosinophils Absolute: 0 10*3/uL (ref 0.0–0.5)
Eosinophils Absolute: 0 10*3/uL (ref 0.0–0.5)
Eosinophils Relative: 1 %
Eosinophils Relative: 1 %
HCT: 28.7 % — ABNORMAL LOW (ref 36.0–46.0)
HCT: 27.4 % — ABNORMAL LOW (ref 36.0–46.0)
Hemoglobin: 9.5 g/dL — ABNORMAL LOW (ref 12.0–15.0)
Hemoglobin: 9 g/dL — ABNORMAL LOW (ref 12.0–15.0)
Immature Granulocytes: 0 %
Lymphocytes Relative: 44 %
Lymphocytes Relative: 17 %
Lymphs Abs: 0.4 10*3/uL — ABNORMAL LOW (ref 0.7–4.0)
Lymphs Abs: 0.8 10*3/uL (ref 0.7–4.0)
MCH: 28.5 pg (ref 26.0–34.0)
MCH: 28.2 pg (ref 26.0–34.0)
MCHC: 33.1 g/dL (ref 30.0–36.0)
MCHC: 32.8 g/dL (ref 30.0–36.0)
MCV: 86.2 fL (ref 80.0–100.0)
MCV: 85.9 fL (ref 80.0–100.0)
Metamyelocytes Relative: 10 %
Monocytes Absolute: 0.4 10*3/uL (ref 0.1–1.0)
Monocytes Absolute: 1.1 10*3/uL — ABNORMAL HIGH (ref 0.1–1.0)
Monocytes Relative: 47 %
Monocytes Relative: 24 %
Myelocytes: 10 %
Neutro Abs: 0.1 10*3/uL — CL (ref 1.7–7.7)
Neutro Abs: 1.6 10*3/uL — ABNORMAL LOW (ref 1.7–7.7)
Neutrophils Relative %: 7 %
Neutrophils Relative %: 29 %
Platelets: 182 10*3/uL (ref 150–400)
Platelets: 238 10*3/uL (ref 150–400)
Promyelocytes Relative: 3 %
RBC: 3.33 MIL/uL — ABNORMAL LOW (ref 3.87–5.11)
RBC: 3.19 MIL/uL — ABNORMAL LOW (ref 3.87–5.11)
RDW: 12 % (ref 11.5–15.5)
RDW: 12 % (ref 11.5–15.5)
WBC: 0.9 10*3/uL — CL (ref 4.0–10.5)
WBC: 4.7 10*3/uL (ref 4.0–10.5)
nRBC: 0 % (ref 0.0–0.2)
nRBC: 0.4 % — ABNORMAL HIGH (ref 0.0–0.2)

## 2022-05-14 LAB — COMPREHENSIVE METABOLIC PANEL
ALT: 22 U/L (ref 0–44)
AST: 20 U/L (ref 15–41)
Albumin: 2.7 g/dL — ABNORMAL LOW (ref 3.5–5.0)
Alkaline Phosphatase: 60 U/L (ref 38–126)
Anion gap: 8 (ref 5–15)
BUN: 7 mg/dL — ABNORMAL LOW (ref 8–23)
CO2: 21 mmol/L — ABNORMAL LOW (ref 22–32)
Calcium: 7.8 mg/dL — ABNORMAL LOW (ref 8.9–10.3)
Chloride: 109 mmol/L (ref 98–111)
Creatinine, Ser: 0.84 mg/dL (ref 0.44–1.00)
GFR, Estimated: >60 mL/min (ref >60)
Glucose, Bld: 110 mg/dL — ABNORMAL HIGH (ref 70–99)
Potassium: 2.9 mmol/L — ABNORMAL LOW (ref 3.5–5.1)
Sodium: 138 mmol/L (ref 135–145)
Total Bilirubin: 0.4 mg/dL (ref 0.3–1.2)
Total Protein: 5.1 g/dL — ABNORMAL LOW (ref 6.5–8.1)

## 2022-05-14 LAB — MAGNESIUM: Magnesium: 1.6 mg/dL — ABNORMAL LOW (ref 1.7–2.4)

## 2022-05-14 MED ORDER — POTASSIUM CHLORIDE CRYS ER 20 MEQ PO TBCR
40.0000 meq | EXTENDED_RELEASE_TABLET | ORAL | Status: AC
  Administered 2022-05-14 (×2): 40 meq via ORAL
  Filled 2022-05-14 (×2): qty 2

## 2022-05-14 MED ORDER — MAGNESIUM SULFATE 2 GM/50ML IV SOLN
2.0000 g | Freq: Once | INTRAVENOUS | Status: AC
  Administered 2022-05-14: 2 g via INTRAVENOUS
  Filled 2022-05-14: qty 50

## 2022-05-14 MED ORDER — PROCHLORPERAZINE EDISYLATE 10 MG/2ML IJ SOLN: 10.0000 mg | Freq: Four times a day (QID) | INTRAMUSCULAR | Status: DC | PRN

## 2022-05-14 MED ORDER — SODIUM CHLORIDE 0.9 % IV SOLN: 8.0000 mg | Freq: Four times a day (QID) | INTRAVENOUS | Status: DC | PRN

## 2022-05-14 NOTE — Progress Notes (Signed)
Allison Pierce ambulated in the hall with her husband about 75 feet, tolerated well.

## 2022-05-14 NOTE — Progress Notes (Signed)
PROGRESS NOTE    Allison Pierce  MLJ:[id number] DOB: 1945/12/14 DOA: 05/11/2022 PCP: [name], MD   Brief Narrative:  76 year old female with history of left breast cancer status postmastectomy, currently on chemotherapy (started on first chemotherapy on 05/05/2022), chronic diastolic heart failure, GERD, hyperlipidemia, hypertension presented with vomiting and multiple episodes of syncope with concern for seizure.  On presentation, patient was found to be volume depleted with lactic acidosis along with neutropenia.  Chest x-ray showed no evidence of any focal infiltrates.  CTA chest was negative for PE.  CT of abdomen and pelvis with contrast showed possible infectious or inflammatory colitis involving the distal descending and sigmoid colon with no obstruction or abscess.  She was started on IV fluids and antibiotics.  Assessment & Plan:   Multiple syncopal episodes -Most likely vasovagal episodes causing syncope with seizure-like episodes.  EEG negative.  No seizures since admission.  No syncope since admission. -Continue IV fluids.  PT eval. -Had recent echocardiogram in 01/2022 hence was not repeated  Chemotherapy induced severe neutropenia -WBC 0.4 on presentation.  Improving to 4.7 today.  Monitor.  Oncology following.  Currently getting Granix as per oncology. -Neutropenic precautions  Acute colitis -As seen on CT of the abdomen as well.  No diarrhea since admission.  DC stool testing. -Continue broad-spectrum antibiotics for now.  Lactic acidosis -Resolved  Oral mucositis -From possible chemotherapy -Continue with Magic mouthwash  Chronic diastolic CHF -compensated.  Strict input and output.  Daily weights.  Hypokalemia -Replace.  Repeat a.m. labs  Hypomagnesemia -Replace.  Repeat a.m. labs  Essential hypertension -Blood pressure intermittently on the higher side.  Continue losartan.  Lobular carcinoma of left breast -Status post left mastectomy in  04/2022.  Started chemotherapy on 05/05/2022.  Oncology following  Depression -continue bupropion and venlafaxine along with clonazepam.  Outpatient follow-up with psychiatry/PCP  Physical deconditioning -PT recommends home with PT  DVT prophylaxis: Lovenox Code Status: Full Family Communication: Husband at bedside Disposition Plan: Status is: inpatient because: Of severity of illness.  For IV antibiotics.    Consultants: Oncology  Procedures: None  Antimicrobials:  Anti-infectives (From admission, onward)    Start     Dose/Rate Route Frequency Ordered Stop   05/12/22 1600  ceFEPIme (MAXIPIME) 2 g in sodium chloride 0.9 % 100 mL IVPB        2 g 200 mL/hr over 30 Minutes Intravenous Every 12 hours 05/12/22 0932     05/12/22 0330  metroNIDAZOLE (FLAGYL) IVPB 500 mg        500 mg 100 mL/hr over 60 Minutes Intravenous Every 12 hours 05/12/22 0327     05/12/22 0215  ceFEPIme (MAXIPIME) 2 g in sodium chloride 0.9 % 100 mL IVPB        2 g 200 mL/hr over 30 Minutes Intravenous  Once 05/12/22 0202 05/12/22 0310         Subjective: Patient seen and examined at bedside.  Still complains of mouth pain and sores in the mouth.  Feels very weak and oral appetite is still poor.  Denies worsening abdominal pain, diarrhea or vomiting. Objective: Vitals:   05/13/22 0450 05/13/22 1419 05/13/22 1949 05/14/22 0525  BP: (!) 159/94 (!) 164/81 (!) 144/86 (!) 147/75  Pulse: (!) 103 99 99 98  Resp: '17  18 16  '$ Temp: 99.3 F (37.4 C) 98 F (36.7 C) 98.1 F (36.7 C) 98.4 F (36.9 C)  TempSrc: Oral Oral Oral Oral  SpO2: 96% 90% 94% 93%  Weight:  75.9 kg  Height:        Intake/Output Summary (Last 24 hours) at 05/14/2022 0844 Last data filed at 05/14/2022 0426 Gross per 24 hour  Intake 1012.54 ml  Output --  Net 1012.54 ml    Filed Weights   05/11/22 2357 05/14/22 0525  Weight: 70.8 kg 75.9 kg    Examination:  General: On room air.  No distress ENT/neck: No thyromegaly.  JVD is  not elevated  respiratory: Decreased breath sounds at bases bilaterally with some crackles; no wheezing CVS: S1-S2 heard, rate controlled Abdominal: Soft, nontender, slightly distended; no organomegaly, bowel sounds are heard Extremities: Trace lower extremity edema; no cyanosis  CNS: Awake and alert.  No focal neurologic deficit.  Moves extremities Lymph: No obvious lymphadenopathy Skin: No obvious ecchymosis/lesions  psych: Not agitated.  Mostly flat affect.   Musculoskeletal: No obvious joint swelling/deformity    Data Reviewed: I have personally reviewed following labs and imaging studies  CBC: Recent Labs  Lab 05/09/22 1348 05/12/22 0009 05/13/22 0603 05/14/22 0711  WBC 3.6* 0.4* 0.9* 4.7  NEUTROABS 3.1 0.1* 0.1* PENDING  HGB 11.3* 10.5* 9.5* 9.0*  HCT 33.3* 31.9* 28.7* 27.4*  MCV 84.1 86.7 86.2 85.9  PLT 227 202 182 553    Basic Metabolic Panel: Recent Labs  Lab 05/09/22 1348 05/12/22 0009 05/13/22 0603 05/14/22 0711  NA 134* 135 137 138  K 4.2 3.5 3.5 2.9*  CL 103 104 105 109  CO2 '27 24 24 '$ 21*  GLUCOSE 113* 114* 122* 110*  BUN 13 10 7* 7*  CREATININE 0.81 0.72 0.74 0.84  CALCIUM 8.6* 8.4* 8.4* 7.8*  MG 1.8 1.7 1.7 1.6*    GFR: Estimated Creatinine Clearance: 58.3 mL/min (by C-G formula based on SCr of 0.84 mg/dL). Liver Function Tests: Recent Labs  Lab 05/09/22 1348 05/12/22 0009 05/13/22 0603 05/14/22 0711  AST '30 29 19 20  '$ ALT 33 35 27 22  ALKPHOS 74 69 63 60  BILITOT 0.7 0.9 0.7 0.4  PROT 6.2* 6.1* 5.7* 5.1*  ALBUMIN 3.8 3.4* 2.9* 2.7*    Recent Labs  Lab 05/12/22 0009  LIPASE 23    No results for input(s): "AMMONIA" in the last 168 hours. Coagulation Profile: No results for input(s): "INR", "PROTIME" in the last 168 hours. Cardiac Enzymes: No results for input(s): "CKTOTAL", "CKMB", "CKMBINDEX", "TROPONINI" in the last 168 hours. BNP (last 3 results) No results for input(s): "PROBNP" in the last 8760 hours. HbA1C: No results for  input(s): "HGBA1C" in the last 72 hours. CBG: Recent Labs  Lab 05/11/22 2359  GLUCAP 105*    Lipid Profile: No results for input(s): "CHOL", "HDL", "LDLCALC", "TRIG", "CHOLHDL", "LDLDIRECT" in the last 72 hours. Thyroid Function Tests: No results for input(s): "TSH", "T4TOTAL", "FREET4", "T3FREE", "THYROIDAB" in the last 72 hours. Anemia Panel: No results for input(s): "VITAMINB12", "FOLATE", "FERRITIN", "TIBC", "IRON", "RETICCTPCT" in the last 72 hours. Sepsis Labs: Recent Labs  Lab 05/12/22 0010 05/12/22 0230 05/12/22 1025  LATICACIDVEN 1.8 2.3* 1.1     Recent Results (from the past 240 hour(s))  Culture, blood (routine x 2)     Status: None (Preliminary result)   Collection Time: 05/12/22  2:30 AM   Specimen: BLOOD  Result Value Ref Range Status   Specimen Description   Final    BLOOD BLOOD RIGHT HAND Performed at [facility], [place] [address], [place], [place] [address]    Special Requests   Final    BOTTLES DRAWN AEROBIC AND  ANAEROBIC Blood Culture adequate volume Performed at [facility] [address] [place], [place] [address]    Culture   Final    NO GROWTH 1 DAY Performed at [facility], [place] [address] [place], [place] [address]    Report Status PENDING  Incomplete  Culture, blood (routine x 2)     Status: None (Preliminary result)   Collection Time: June 07, 2022  2:40 AM   Specimen: BLOOD  Result Value Ref Range Status   Specimen Description   Final    BLOOD RIGHT ANTECUBITAL Performed at [facility] [address] [place], [place] [address]    Special Requests   Final    BOTTLES DRAWN AEROBIC AND ANAEROBIC Blood Culture results may not be optimal due to an excessive volume of blood received in culture bottles Performed at [facility] [address], [place], [place] [address]    Culture   Final    NO GROWTH 1 DAY Performed at [facility], [place] [address] [place], [place] [address]    Report Status PENDING  Incomplete         Radiology Studies: EEG adult  Result Date: 06-07-22 Lora Havens, MD     Jun 07, 2022  4:40 PM Patient Name: Allison Pierce MRN: [id number] Epilepsy Attending: Lora Havens Referring Physician/Provider: Vernelle Emerald, MD Date: 07-Jun-2022 Duration: 22.07 mins Patient history: 76yo F with multiple episodes of witnessed loss of consciousness followed by shaking that the patient has been perceived as "seizures". EEG to evaluate for seizure. Level of alertness: Awake, asleep AEDs during EEG study: Clonazepam Technical aspects: This EEG study was done with scalp electrodes positioned according to the 10-20 International system of electrode placement. Electrical activity was reviewed with band pass filter of 1-'70Hz'$ , sensitivity of 7 uV/mm, display speed of 93m/sec with a '60Hz'$  notched filter applied as appropriate. EEG data were recorded continuously and digitally stored.  Video monitoring was available and reviewed as appropriate. Description: The posterior dominant rhythm consists of 9-10 Hz activity of moderate voltage (25-35 uV) seen predominantly in posterior head regions, symmetric and reactive to eye opening and eye closing. Sleep was characterized by vertex waves, maximal frontocentral region. Hyperventilation and photic stimulation were not performed.   IMPRESSION: This study is within normal limits. No seizures or epileptiform discharges were seen throughout the recording. A normal interictal EEG does not exclude nor support the diagnosis of epilepsy. Priyanka O Yadav        Scheduled Meds:  buPROPion  300 mg Oral q morning   clonazePAM  1 mg Oral QHS   enoxaparin (LOVENOX) injection  40 mg Subcutaneous Daily   losartan  50 mg Oral Daily   meloxicam  15 mg Oral Daily   Tbo-filgastrim (GRANIX) SQ  480 mcg Subcutaneous q1800   traZODone  225 mg Oral QHS   venlafaxine XR  75 mg Oral Q breakfast    Continuous Infusions:  sodium chloride Stopped (05/13/22 2234)   ceFEPime (MAXIPIME) IV 2 g (05/14/22 0511)   metronidazole Stopped (05/14/22 0347)   ondansetron (ZOFRAN) IV 16 mg (05/14/22 0549)          KAline August MD [facility] 05/14/2022, 8:44 AM

## 2022-05-15 ENCOUNTER — Encounter: Payer: Self-pay | Admitting: *Deleted

## 2022-05-15 DIAGNOSIS — B029 Zoster without complications: Secondary | ICD-10-CM

## 2022-05-15 DIAGNOSIS — K529 Noninfective gastroenteritis and colitis, unspecified: Secondary | ICD-10-CM | POA: Diagnosis not present

## 2022-05-15 DIAGNOSIS — D701 Agranulocytosis secondary to cancer chemotherapy: Secondary | ICD-10-CM | POA: Diagnosis not present

## 2022-05-15 DIAGNOSIS — R112 Nausea with vomiting, unspecified: Secondary | ICD-10-CM | POA: Diagnosis not present

## 2022-05-15 DIAGNOSIS — R55 Syncope and collapse: Secondary | ICD-10-CM | POA: Diagnosis not present

## 2022-05-15 LAB — CBC WITH DIFFERENTIAL/PLATELET
Abs Immature Granulocytes: 4.24 10*3/uL — ABNORMAL HIGH (ref 0.00–0.07)
Basophils Absolute: 0 10*3/uL (ref 0.0–0.1)
Basophils Relative: 0 %
Eosinophils Absolute: 0 10*3/uL (ref 0.0–0.5)
Eosinophils Relative: 0 %
HCT: 28.7 % — ABNORMAL LOW (ref 36.0–46.0)
Hemoglobin: 9.5 g/dL — ABNORMAL LOW (ref 12.0–15.0)
Immature Granulocytes: 18 %
Lymphocytes Relative: 10 %
Lymphs Abs: 2.3 10*3/uL (ref 0.7–4.0)
MCH: 28.4 pg (ref 26.0–34.0)
MCHC: 33.1 g/dL (ref 30.0–36.0)
MCV: 85.7 fL (ref 80.0–100.0)
Monocytes Absolute: 3.9 10*3/uL — ABNORMAL HIGH (ref 0.1–1.0)
Monocytes Relative: 16 %
Neutro Abs: 13.4 10*3/uL — ABNORMAL HIGH (ref 1.7–7.7)
Neutrophils Relative %: 56 %
Platelets: 312 10*3/uL (ref 150–400)
RBC: 3.35 MIL/uL — ABNORMAL LOW (ref 3.87–5.11)
RDW: 12.5 % (ref 11.5–15.5)
WBC: 24 10*3/uL — ABNORMAL HIGH (ref 4.0–10.5)
nRBC: 0.1 % (ref 0.0–0.2)

## 2022-05-15 LAB — BASIC METABOLIC PANEL
Anion gap: 7 (ref 5–15)
BUN: 5 mg/dL — ABNORMAL LOW (ref 8–23)
CO2: 22 mmol/L (ref 22–32)
Calcium: 7.8 mg/dL — ABNORMAL LOW (ref 8.9–10.3)
Chloride: 104 mmol/L (ref 98–111)
Creatinine, Ser: 0.82 mg/dL (ref 0.44–1.00)
GFR, Estimated: >60 mL/min (ref >60)
Glucose, Bld: 121 mg/dL — ABNORMAL HIGH (ref 70–99)
Potassium: 3.5 mmol/L (ref 3.5–5.1)
Sodium: 133 mmol/L — ABNORMAL LOW (ref 135–145)

## 2022-05-15 LAB — MAGNESIUM: Magnesium: 2 mg/dL (ref 1.7–2.4)

## 2022-05-15 MED ORDER — VALACYCLOVIR HCL 500 MG PO TABS
1000.0000 mg | ORAL_TABLET | Freq: Three times a day (TID) | ORAL | Status: DC
  Administered 2022-05-15 – 2022-05-18 (×10): 1000 mg via ORAL
  Filled 2022-05-15 (×11): qty 2

## 2022-05-15 NOTE — Progress Notes (Signed)
Report called to Allison Pierce. Patient being transferred to [id number], an Isolation room.

## 2022-05-15 NOTE — Plan of Care (Signed)

## 2022-05-15 NOTE — Progress Notes (Signed)
Pharmacy Antibiotic Note  Assessment: 4 YOF with a PMH significant for recently dx'd breast cancer (initial chemo: 8/4) who presented to the ED on 8/10 complaining of N/V, diarrhea, chest pain, and seizure-like syncopal episodes. She was found to be volume depleted with lactic acidosis and neutropenia. CXR and CTA were unremarkable. CT A/P was notable for possible colitis with no obstruction or abscess. She was started on IV fluids and antibiotics 8/11 and has not had diarrhea since admission. Pharmacy has been consulted for cefepime dosing.  Today, 05/15/22: - SCr WNL and stable - Afebrile - WBC and Neut counts elevated, s/p Granix 480 mcg on 8/12  Plan: - Continue cefepime 2 g IV q12h - Continue metronidazole per MD - Monitor Scr daily - Follow up abx length of therapy  Height: '5\' 6"'$  (167.6 cm) Weight: 75.9 kg (167 lb 5.3 oz) IBW/kg (Calculated) : 59.3  Temp (24hrs), Avg:98.7 F (37.1 C), Min:98.2 F (36.8 C), Max:99.5 F (37.5 C)  Recent Labs  Lab 05/09/22 1348 05/12/22 0009 05/12/22 0010 05/12/22 0230 05/12/22 1025 05/13/22 0603 05/14/22 0711 05/15/22 0509  WBC 3.6* 0.4*  --   --   --  0.9* 4.7 24.0*  CREATININE 0.81 0.72  --   --   --  0.74 0.84 0.82  LATICACIDVEN  --   --  1.8 2.3* 1.1  --   --   --     Estimated Creatinine Clearance: 59.8 mL/min (by C-G formula based on SCr of 0.82 mg/dL).    Allergies  Allergen Reactions   Gadolinium Derivatives Itching and Other (See Comments)    Redness and itching, skins feels like it was burning. In the future pt will need pre med. Redness and itching, skins feels like it was burning. In the future pt will need pre med.   Atorvastatin Calcium Other (See Comments)   Azelaic Acid Other (See Comments)   Fluoxetine Other (See Comments)   Oxybutynin Other (See Comments)   Prozac [Fluoxetine Hcl] Other (See Comments)    "COULDN'T LIFT HEAD AND GET OUT OF BED"   Ritalin [Methylphenidate] Other (See Comments)   Rosuvastatin  Other (See Comments)   Lyrica [Pregabalin] Anxiety    "FELT WEIRD"    Antimicrobials this admission: cefepime 8/11 >>  metronidazole 8/11 >>   Microbiology results: 8/11 BCx x 2: NGTD  Thank you for allowing pharmacy to be a part of this patient's care.  Cannon Kettle, PharmD Candidate 05/15/2022 1:12 PM

## 2022-05-15 NOTE — Plan of Care (Signed)
Plan of care reviewed and discussed with the patient and her husband. 

## 2022-05-15 NOTE — Progress Notes (Signed)
PROGRESS NOTE    Allison Pierce  SNK:[id number] DOB: 1946/07/05 DOA: 05/11/2022 PCP: [name], MD   Brief Narrative:  76 year old female with history of left breast cancer status postmastectomy, currently on chemotherapy (started on first chemotherapy on 05/05/2022), chronic diastolic heart failure, GERD, hyperlipidemia, hypertension presented with vomiting and multiple episodes of syncope with concern for seizure.  On presentation, patient was found to be volume depleted with lactic acidosis along with neutropenia.  Chest x-ray showed no evidence of any focal infiltrates.  CTA chest was negative for PE.  CT of abdomen and pelvis with contrast showed possible infectious or inflammatory colitis involving the distal descending and sigmoid colon with no obstruction or abscess.  She was started on IV fluids and antibiotics.  Assessment & Plan:   Multiple syncopal episodes -Most likely vasovagal episodes causing syncope with seizure-like episodes.  EEG negative.  No seizures since admission.  No syncope since admission. -DC IV fluids today.  PT recommended home health PT. -Had recent echocardiogram in 01/2022 hence was not repeated  Chemotherapy induced severe neutropenia Leukocytosis -WBC 0.4 on presentation.  WBCs 24 today.  Monitor.  Oncology following.  Off Granix. -Neutropenic precautions  Acute colitis -As seen on CT of the abdomen as well.  No diarrhea since admission.  DC stool testing. -Continue broad-spectrum antibiotics for now.  Possible shingles -Has possible single dermatomal shingles rash around the right subcostal area.  Treat with Valtrex 1 g 3 times daily for 7 days.  Start isolation.  Lactic acidosis -Resolved  Oral mucositis -From possible chemotherapy -Continue with Magic mouthwash  Chronic diastolic CHF -compensated.  Strict input and output.  Daily weights.  Hypokalemia -Improved.  Repeat a.m. labs  Hypomagnesemia -Improved.  Essential  hypertension -Blood pressure intermittently on the higher side.  Continue losartan.  Lobular carcinoma of left breast -Status post left mastectomy in 04/2022.  Started chemotherapy on 05/05/2022.  Oncology following  Depression -continue bupropion and venlafaxine along with clonazepam.  Outpatient follow-up with psychiatry/PCP  Physical deconditioning -PT recommends home with PT  DVT prophylaxis: Lovenox Code Status: Full Family Communication: Husband at bedside Disposition Plan: Status is: inpatient because: Of severity of illness.  For IV antibiotics.   Consultants: Oncology  Procedures: None  Antimicrobials:  Anti-infectives (From admission, onward)    Start     Dose/Rate Route Frequency Ordered Stop   05/12/22 1600  ceFEPIme (MAXIPIME) 2 g in sodium chloride 0.9 % 100 mL IVPB        2 g 200 mL/hr over 30 Minutes Intravenous Every 12 hours 05/12/22 0932     05/12/22 0330  metroNIDAZOLE (FLAGYL) IVPB 500 mg        500 mg 100 mL/hr over 60 Minutes Intravenous Every 12 hours 05/12/22 0327     05/12/22 0215  ceFEPIme (MAXIPIME) 2 g in sodium chloride 0.9 % 100 mL IVPB        2 g 200 mL/hr over 30 Minutes Intravenous  Once 05/12/22 0202 05/12/22 0310         Subjective: Patient seen and examined at bedside.  Feels slightly better.  Still weak.  Oral intake is still poor.  No overnight fever, vomiting, shortness of breath reported.   Objective: Vitals:   05/14/22 0525 05/14/22 1255 05/14/22 2049 05/15/22 0453  BP: (!) 147/75 (!) 145/59 (!) 170/90 (!) 150/74  Pulse: 98 98 (!) 104 (!) 101  Resp: '16 16 16 18  '$ Temp: 98.4 F (36.9 C) 98.9 F (37.2 C) 98.3 F (  36.8 C) 98.2 F (36.8 C)  TempSrc: Oral Oral Oral Oral  SpO2: 93% 92% 90% 94%  Weight: 75.9 kg     Height:        Intake/Output Summary (Last 24 hours) at 05/15/2022 0837 Last data filed at 05/15/2022 0600 Gross per 24 hour  Intake 999.95 ml  Output 0 ml  Net 999.95 ml    Filed Weights   05/11/22 2357  05/14/22 0525  Weight: 70.8 kg 75.9 kg    Examination:  General: No acute distress.  On room air.  Chronically ill and deconditioned.   ENT/neck: No palpable neck masses or JVD elevation noted  respiratory: Bilateral decreased breath sounds at bases with some scattered crackles, no wheezing  CVS: Mild intermittent tachycardia present; S1 and S2 heard  abdominal: Soft, nontender, mild distention present, no organomegaly, bowel sound are heard  extremities: No clubbing; still has lower extremity edema CNS: Alert and oriented.  No focal neurologic deficit.  Moving extremities Lymph: No cervical lymphadenopathy palpable  skin: Possible single dermatomal vesicular rash in the right subcostal region with some erythema psych: Flat affect.  Not agitated currently.    Data Reviewed: I have personally reviewed following labs and imaging studies  CBC: Recent Labs  Lab 05/09/22 1348 05/12/22 0009 05/13/22 0603 05/14/22 0711 05/15/22 0509  WBC 3.6* 0.4* 0.9* 4.7 24.0*  NEUTROABS 3.1 0.1* 0.1* 1.6* 13.4*  HGB 11.3* 10.5* 9.5* 9.0* 9.5*  HCT 33.3* 31.9* 28.7* 27.4* 28.7*  MCV 84.1 86.7 86.2 85.9 85.7  PLT 227 202 182 238 831    Basic Metabolic Panel: Recent Labs  Lab 05/09/22 1348 05/12/22 0009 05/13/22 0603 05/14/22 0711 05/15/22 0509  NA 134* 135 137 138 133*  K 4.2 3.5 3.5 2.9* 3.5  CL 103 104 105 109 104  CO2 '27 24 24 '$ 21* 22  GLUCOSE 113* 114* 122* 110* 121*  BUN 13 10 7* 7* 5*  CREATININE 0.81 0.72 0.74 0.84 0.82  CALCIUM 8.6* 8.4* 8.4* 7.8* 7.8*  MG 1.8 1.7 1.7 1.6* 2.0    GFR: Estimated Creatinine Clearance: 59.8 mL/min (by C-G formula based on SCr of 0.82 mg/dL). Liver Function Tests: Recent Labs  Lab 05/09/22 1348 05/12/22 0009 05/13/22 0603 05/14/22 0711  AST '30 29 19 20  '$ ALT 33 35 27 22  ALKPHOS 74 69 63 60  BILITOT 0.7 0.9 0.7 0.4  PROT 6.2* 6.1* 5.7* 5.1*  ALBUMIN 3.8 3.4* 2.9* 2.7*    Recent Labs  Lab 05/12/22 0009  LIPASE 23    No results  for input(s): "AMMONIA" in the last 168 hours. Coagulation Profile: No results for input(s): "INR", "PROTIME" in the last 168 hours. Cardiac Enzymes: No results for input(s): "CKTOTAL", "CKMB", "CKMBINDEX", "TROPONINI" in the last 168 hours. BNP (last 3 results) No results for input(s): "PROBNP" in the last 8760 hours. HbA1C: No results for input(s): "HGBA1C" in the last 72 hours. CBG: Recent Labs  Lab 05/11/22 2359  GLUCAP 105*    Lipid Profile: No results for input(s): "CHOL", "HDL", "LDLCALC", "TRIG", "CHOLHDL", "LDLDIRECT" in the last 72 hours. Thyroid Function Tests: No results for input(s): "TSH", "T4TOTAL", "FREET4", "T3FREE", "THYROIDAB" in the last 72 hours. Anemia Panel: No results for input(s): "VITAMINB12", "FOLATE", "FERRITIN", "TIBC", "IRON", "RETICCTPCT" in the last 72 hours. Sepsis Labs: Recent Labs  Lab 05/12/22 0010 05/12/22 0230 05/12/22 1025  LATICACIDVEN 1.8 2.3* 1.1     Recent Results (from the past 240 hour(s))  Culture, blood (routine x 2)  Status: None (Preliminary result)   Collection Time: 05/12/22  2:30 AM   Specimen: BLOOD  Result Value Ref Range Status   Specimen Description   Final    BLOOD BLOOD RIGHT HAND Performed at [facility] [address], [place], [place] [address]    Special Requests   Final    BOTTLES DRAWN AEROBIC AND ANAEROBIC Blood Culture adequate volume Performed at [facility] [address], [place], [place] [address]    Culture   Final    NO GROWTH 3 DAYS Performed at [facility], [place] [address] [place], [place] [address]    Report Status PENDING  Incomplete  Culture, blood (routine x 2)     Status: None (Preliminary result)   Collection Time: 05/12/22  2:40 AM   Specimen: BLOOD  Result Value Ref Range Status   Specimen Description   Final    BLOOD RIGHT ANTECUBITAL Performed at [facility] [address], [place], [place] [address]     Special Requests   Final    BOTTLES DRAWN AEROBIC AND ANAEROBIC Blood Culture results may not be optimal due to an excessive volume of blood received in culture bottles Performed at [facility] [address], [place], [place] [address]    Culture   Final    NO GROWTH 3 DAYS Performed at [facility], [place] [address] [place],  [address]    Report Status PENDING  Incomplete         Radiology Studies: No results found.      Scheduled Meds:  buPROPion  300 mg Oral q morning   clonazePAM  1 mg Oral QHS   enoxaparin (LOVENOX) injection  40 mg Subcutaneous Daily   losartan  50 mg Oral Daily   meloxicam  15 mg Oral Daily   traZODone  225 mg Oral QHS   venlafaxine XR  75 mg Oral Q breakfast   Continuous Infusions:  sodium chloride 50 mL/hr at 05/15/22 0203   ceFEPime (MAXIPIME) IV 2 g (05/15/22 0515)   metronidazole 500 mg (05/15/22 0333)   ondansetron (ZOFRAN) IV            Aline August, MD [facility] 05/15/2022, 8:37 AM

## 2022-05-16 DIAGNOSIS — R55 Syncope and collapse: Secondary | ICD-10-CM | POA: Diagnosis not present

## 2022-05-16 DIAGNOSIS — K529 Noninfective gastroenteritis and colitis, unspecified: Secondary | ICD-10-CM | POA: Diagnosis not present

## 2022-05-16 DIAGNOSIS — B028 Zoster with other complications: Secondary | ICD-10-CM

## 2022-05-16 DIAGNOSIS — D701 Agranulocytosis secondary to cancer chemotherapy: Secondary | ICD-10-CM | POA: Diagnosis not present

## 2022-05-16 DIAGNOSIS — R112 Nausea with vomiting, unspecified: Secondary | ICD-10-CM | POA: Diagnosis not present

## 2022-05-16 LAB — CBC WITH DIFFERENTIAL/PLATELET
Abs Immature Granulocytes: 7.06 10*3/uL — ABNORMAL HIGH (ref 0.00–0.07)
Basophils Absolute: 0.1 10*3/uL (ref 0.0–0.1)
Basophils Relative: 0 %
Eosinophils Absolute: 0 10*3/uL (ref 0.0–0.5)
Eosinophils Relative: 0 %
HCT: 27.5 % — ABNORMAL LOW (ref 36.0–46.0)
Hemoglobin: 8.9 g/dL — ABNORMAL LOW (ref 12.0–15.0)
Immature Granulocytes: 24 %
Lymphocytes Relative: 8 %
Lymphs Abs: 2.5 10*3/uL (ref 0.7–4.0)
MCH: 28.2 pg (ref 26.0–34.0)
MCHC: 32.4 g/dL (ref 30.0–36.0)
MCV: 87 fL (ref 80.0–100.0)
Monocytes Absolute: 3.6 10*3/uL — ABNORMAL HIGH (ref 0.1–1.0)
Monocytes Relative: 12 %
Neutro Abs: 16.9 10*3/uL — ABNORMAL HIGH (ref 1.7–7.7)
Neutrophils Relative %: 56 %
Platelets: 310 10*3/uL (ref 150–400)
RBC: 3.16 MIL/uL — ABNORMAL LOW (ref 3.87–5.11)
RDW: 12.9 % (ref 11.5–15.5)
WBC: 30.1 10*3/uL — ABNORMAL HIGH (ref 4.0–10.5)
nRBC: 0.1 % (ref 0.0–0.2)

## 2022-05-16 LAB — MAGNESIUM: Magnesium: 2.2 mg/dL (ref 1.7–2.4)

## 2022-05-16 LAB — BASIC METABOLIC PANEL
Anion gap: 6 (ref 5–15)
BUN: 8 mg/dL (ref 8–23)
CO2: 22 mmol/L (ref 22–32)
Calcium: 8 mg/dL — ABNORMAL LOW (ref 8.9–10.3)
Chloride: 111 mmol/L (ref 98–111)
Creatinine, Ser: 0.89 mg/dL (ref 0.44–1.00)
GFR, Estimated: >60 mL/min (ref >60)
Glucose, Bld: 113 mg/dL — ABNORMAL HIGH (ref 70–99)
Potassium: 3.2 mmol/L — ABNORMAL LOW (ref 3.5–5.1)
Sodium: 139 mmol/L (ref 135–145)

## 2022-05-16 LAB — PATHOLOGIST SMEAR REVIEW

## 2022-05-16 MED ORDER — LOPERAMIDE HCL 2 MG PO CAPS: 4.0000 mg | ORAL_CAPSULE | ORAL | Status: DC | PRN

## 2022-05-16 MED ORDER — DIPHENOXYLATE-ATROPINE 2.5-0.025 MG PO TABS
1.0000 | ORAL_TABLET | Freq: Two times a day (BID) | ORAL | Status: DC
  Administered 2022-05-16 – 2022-05-18 (×4): 1 via ORAL
  Filled 2022-05-16 (×4): qty 1

## 2022-05-16 MED ORDER — POTASSIUM CHLORIDE CRYS ER 20 MEQ PO TBCR
40.0000 meq | EXTENDED_RELEASE_TABLET | ORAL | Status: AC
  Administered 2022-05-16 (×2): 40 meq via ORAL
  Filled 2022-05-16 (×2): qty 2

## 2022-05-16 NOTE — Progress Notes (Addendum)
PROGRESS NOTE    Allison Pierce  HER:[id number] DOB: Oct 19, 1945 DOA: 05/11/2022 PCP: [name], MD   Brief Narrative:  76 year old female with history of left breast cancer status postmastectomy, currently on chemotherapy (started on first chemotherapy on 05/05/2022), chronic diastolic heart failure, GERD, hyperlipidemia, hypertension presented with vomiting and multiple episodes of syncope with concern for seizure.  On presentation, patient was found to be volume depleted with lactic acidosis along with neutropenia.  Chest x-ray showed no evidence of any focal infiltrates.  CTA chest was negative for PE.  CT of abdomen and pelvis with contrast showed possible infectious or inflammatory colitis involving the distal descending and sigmoid colon with no obstruction or abscess.  She was started on IV fluids and antibiotics.  Oral intake has remained poor.  Assessment & Plan:   Multiple syncopal episodes -Most likely vasovagal episodes causing syncope with seizure-like episodes.  EEG negative.  No seizures since admission.  No syncope since admission. -DC IV fluids today.  PT recommended home health PT. -Had recent echocardiogram in 01/2022 hence was not repeated  Chemotherapy induced severe neutropenia Leukocytosis -WBC 0.4 on presentation.  WBCs 30.1 today.  Monitor.  Oncology following.  Off Granix.  Acute colitis -As seen on CT of the abdomen as well.  Diarrhea improved right after admission, and stool testing was discontinued -Currently on cefepime and Flagyl. -Starting to have diarrhea again which is bothersome.  Use lomotil as needed.  Possible shingles -Has possible single dermatomal shingles rash around the right subcostal area.  Continue Valtrex 1 g 3 times daily for 7 days.  Continue isolation.  Lactic acidosis -Resolved  Oral mucositis -From possible chemotherapy -Continue with Magic mouthwash  Chronic diastolic CHF -compensated.  Strict input and output.   Daily weights.  Hypokalemia -Replace.  Repeat a.m. labs  Hypomagnesemia -Improved.  Essential hypertension -Blood pressure intermittently on the higher side.  Continue losartan.  Lobular carcinoma of left breast -Status post left mastectomy in 04/2022.  Started chemotherapy on 05/05/2022.  Oncology following intermittently  Depression -continue bupropion and venlafaxine along with clonazepam.  Outpatient follow-up with psychiatry/PCP  Physical deconditioning -PT recommends home with PT.  Patient feels very weak and tired.  Will ask PT to evaluate the patient.  DVT prophylaxis: Lovenox Code Status: Full Family Communication: Husband at bedside Disposition Plan: Status is: inpatient because: Of severity of illness.  For IV antibiotics.  Possible discharge home in 1 to 2 days if clinically improves.   Consultants: Oncology  Procedures: None  Antimicrobials:  Anti-infectives (From admission, onward)    Start     Dose/Rate Route Frequency Ordered Stop   05/15/22 1000  valACYclovir (VALTREX) tablet 1,000 mg        1,000 mg Oral 3 times daily 05/15/22 0837 05/22/22 0959   05/12/22 1600  ceFEPIme (MAXIPIME) 2 g in sodium chloride 0.9 % 100 mL IVPB        2 g 200 mL/hr over 30 Minutes Intravenous Every 12 hours 05/12/22 0932     05/12/22 0330  metroNIDAZOLE (FLAGYL) IVPB 500 mg        500 mg 100 mL/hr over 60 Minutes Intravenous Every 12 hours 05/12/22 0327     05/12/22 0215  ceFEPIme (MAXIPIME) 2 g in sodium chloride 0.9 % 100 mL IVPB        2 g 200 mL/hr over 30 Minutes Intravenous  Once 05/12/22 0202 05/12/22 0310         Subjective: Patient seen and examined at  bedside.  Does not feel well.  Feels very weak.  Oral intake is still poor.  Started to have diarrhea again since yesterday.  No fever, vomiting reported.   Objective: Vitals:   05/15/22 2335 05/16/22 0426 05/16/22 0500 05/16/22 1206  BP: 139/69 135/73  (!) 142/72  Pulse: 95 82  92  Resp: '18 18  20  '$ Temp:  97.8 F (36.6 C) 98.6 F (37 C)  97.9 F (36.6 C)  TempSrc: Oral Oral  Oral  SpO2: 93% 96%  92%  Weight:   69.5 kg   Height:        Intake/Output Summary (Last 24 hours) at 05/16/2022 1256 Last data filed at 05/16/2022 0900 Gross per 24 hour  Intake 840 ml  Output 200 ml  Net 640 ml    Filed Weights   05/14/22 0525 05/15/22 1348 05/16/22 0500  Weight: 75.9 kg 71.1 kg 69.5 kg    Examination:  General: On room air currently.  No distress.  Chronically ill and deconditioned.   ENT/neck: No JVD elevation or palpable thyromegaly noted at this time  respiratory: Decreased breath sounds at bases bilaterally with some crackles  CVS: S1-S2 heard; currently rate controlled abdominal: Soft, nontender, still has some mild distention; no organomegaly, normal sounds heard extremities: Mild lower extremity edema present; no cyanosis  CNS: Awake and alert.  No focal neurologic deficit.  Moves extremities  lymph: No palpable lymphadenopathy noted  skin: Possible single dermatomal vesicular rash in the right subcostal region with some erythema psych: No signs of agitation.  Flat affect currently  Data Reviewed: I have personally reviewed following labs and imaging studies  CBC: Recent Labs  Lab 05/12/22 0009 05/13/22 0603 05/14/22 0711 05/15/22 0509 05/16/22 0507  WBC 0.4* 0.9* 4.7 24.0* 30.1*  NEUTROABS 0.1* 0.1* 1.6* 13.4* 16.9*  HGB 10.5* 9.5* 9.0* 9.5* 8.9*  HCT 31.9* 28.7* 27.4* 28.7* 27.5*  MCV 86.7 86.2 85.9 85.7 87.0  PLT 202 182 238 312 128    Basic Metabolic Panel: Recent Labs  Lab 05/12/22 0009 05/13/22 0603 05/14/22 0711 05/15/22 0509 05/16/22 0507  NA 135 137 138 133* 139  K 3.5 3.5 2.9* 3.5 3.2*  CL 104 105 109 104 111  CO2 24 24 21* 22 22  GLUCOSE 114* 122* 110* 121* 113*  BUN 10 7* 7* 5* 8  CREATININE 0.72 0.74 0.84 0.82 0.89  CALCIUM 8.4* 8.4* 7.8* 7.8* 8.0*  MG 1.7 1.7 1.6* 2.0 2.2    GFR: Estimated Creatinine Clearance: 49.6 mL/min (by C-G  formula based on SCr of 0.89 mg/dL). Liver Function Tests: Recent Labs  Lab 05/09/22 1348 05/12/22 0009 05/13/22 0603 05/14/22 0711  AST '30 29 19 20  '$ ALT 33 35 27 22  ALKPHOS 74 69 63 60  BILITOT 0.7 0.9 0.7 0.4  PROT 6.2* 6.1* 5.7* 5.1*  ALBUMIN 3.8 3.4* 2.9* 2.7*    Recent Labs  Lab 05/12/22 0009  LIPASE 23    No results for input(s): "AMMONIA" in the last 168 hours. Coagulation Profile: No results for input(s): "INR", "PROTIME" in the last 168 hours. Cardiac Enzymes: No results for input(s): "CKTOTAL", "CKMB", "CKMBINDEX", "TROPONINI" in the last 168 hours. BNP (last 3 results) No results for input(s): "PROBNP" in the last 8760 hours. HbA1C: No results for input(s): "HGBA1C" in the last 72 hours. CBG: Recent Labs  Lab 05/11/22 2359  GLUCAP 105*    Lipid Profile: No results for input(s): "CHOL", "HDL", "LDLCALC", "TRIG", "CHOLHDL", "LDLDIRECT" in the last 72  hours. Thyroid Function Tests: No results for input(s): "TSH", "T4TOTAL", "FREET4", "T3FREE", "THYROIDAB" in the last 72 hours. Anemia Panel: No results for input(s): "VITAMINB12", "FOLATE", "FERRITIN", "TIBC", "IRON", "RETICCTPCT" in the last 72 hours. Sepsis Labs: Recent Labs  Lab 05/12/22 0010 05/12/22 0230 05/12/22 1025  LATICACIDVEN 1.8 2.3* 1.1     Recent Results (from the past 240 hour(s))  Culture, blood (routine x 2)     Status: None (Preliminary result)   Collection Time: 05/12/22  2:30 AM   Specimen: BLOOD  Result Value Ref Range Status   Specimen Description   Final    BLOOD BLOOD RIGHT HAND Performed at [facility], [place] [address], [place], [place] [address]    Special Requests   Final    BOTTLES DRAWN AEROBIC AND ANAEROBIC Blood Culture adequate volume Performed at [facility] [address], [place], [place] [address]    Culture   Final    NO GROWTH 4 DAYS Performed at [facility], [place] [address], [place], [place] [address]     Report Status PENDING  Incomplete  Culture, blood (routine x 2)     Status: None (Preliminary result)   Collection Time: 05/12/22  2:40 AM   Specimen: BLOOD  Result Value Ref Range Status   Specimen Description   Final    BLOOD RIGHT ANTECUBITAL Performed at [facility] [address], [place], [place] [address]    Special Requests   Final    BOTTLES DRAWN AEROBIC AND ANAEROBIC Blood Culture results may not be optimal due to an excessive volume of blood received in culture bottles Performed at [facility] [address] [place], [place] [address]    Culture   Final    NO GROWTH 4 DAYS Performed at [facility], [place] [address] [place], [place] [address]    Report Status PENDING  Incomplete         Radiology Studies: No results found.      Scheduled Meds:  buPROPion  300 mg Oral q morning   clonazePAM  1 mg Oral QHS   enoxaparin (LOVENOX) injection  40 mg Subcutaneous Daily   losartan  50 mg Oral Daily   meloxicam  15 mg Oral Daily   traZODone  225 mg Oral QHS   valACYclovir  1,000 mg Oral TID   venlafaxine XR  75 mg Oral Q breakfast   Continuous Infusions:  ceFEPime (MAXIPIME) IV 2 g (05/16/22 0453)   metronidazole 500 mg (05/16/22 0336)   ondansetron (ZOFRAN) IV            Aline August, MD [facility] 05/16/2022, 12:56 PM

## 2022-05-16 NOTE — Progress Notes (Signed)
CC: diarrhea 4 times a day, starting to eat some food, Generalized weakness and fatigue     05/16/2022   12:06 PM 05/16/2022    5:00 AM 05/16/2022    4:26 AM  Vitals with BMI  Weight  153 lbs 3 oz   BMI  28.41   Systolic 324  401  Diastolic 72  73  Pulse 92  82    Exam: Abd: soft Lungs: Clear Heart: S1 S2 Normal Ext: No edema Neuro: Intact     Latest Ref Rng & Units 05/16/2022    5:07 AM 05/15/2022    5:09 AM 05/14/2022    7:11 AM  CBC  WBC 4.0 - 10.5 K/uL 30.1  24.0  4.7   Hemoglobin 12.0 - 15.0 g/dL 8.9  9.5  9.0   Hematocrit 36.0 - 46.0 % 27.5  28.7  27.4   Platelets 150 - 400 K/uL 310  312  238        Latest Ref Rng & Units 05/16/2022    5:07 AM 05/15/2022    5:09 AM 05/14/2022    7:11 AM  CMP  Glucose 70 - 99 mg/dL 113  121  110   BUN 8 - 23 mg/dL '8  5  7   ' Creatinine 0.44 - 1.00 mg/dL 0.89  0.82  0.84   Sodium 135 - 145 mmol/L 139  133  138   Potassium 3.5 - 5.1 mmol/L 3.2  3.5  2.9   Chloride 98 - 111 mmol/L 111  104  109   CO2 22 - 32 mmol/L '22  22  21   ' Calcium 8.9 - 10.3 mg/dL 8.0  7.8  7.8   Total Protein 6.5 - 8.1 g/dL   5.1   Total Bilirubin 0.3 - 1.2 mg/dL   0.4   Alkaline Phos 38 - 126 U/L   60   AST 15 - 41 U/L   20   ALT 0 - 44 U/L   22    Plan  Diarrhea: will schedule lomotil and use imodium as PRN Met Breast cancer: Outpatient therapy with herceptin Neutropenia: resolved Anemia due to acute illness Fatigue and weakness: PT consult

## 2022-05-16 NOTE — TOC Initial Note (Addendum)
Transition of Care [facility]) - Initial/Assessment Note    Patient Details  Name: Allison Pierce MRN: [id number] Date of Birth: [birth year]  Transition of Care [facility]) CM/SW Contact:    Roseanne Kaufman, RN Phone Number: 05/16/2022, 2:21 PM  Clinical Narrative:  HH choice offered spouse will go with agency that accepts pt's insurance. Spouse Allison Pierce questioning if his BCBS coverage can be included, RNCM explained Medicare is primary which is the insurance on file. RNCM advised Allison Pierce, [facility] agency information will be on d/c paperwork, Allison Pierce verbalized understanding.          - 2:45p [facility] will follow patient for HHPT, MD notified for HHPT orders.  TOC will continue to follow.      Expected Discharge Plan: [place] Barriers to Discharge: Continued Medical Work up   Patient Goals and CMS Choice Patient states their goals for this hospitalization and ongoing recovery are:: home with home health services CMS Medicare.gov Compare Post Acute Care list provided to:: Patient Choice offered to / list presented to : Spouse  Expected Discharge Plan and Services Expected Discharge Plan: [place]   Discharge Planning Services: CM Consult Post Acute Care Choice: [place] arrangements for the past 2 months: Single Family Home                           HH Arranged: PT          Prior Living Arrangements/Services Living arrangements for the past 2 months: Single Family Home Lives with:: Spouse Allison Pierce) Patient language and need for interpreter reviewed:: Yes Do you feel safe going back to the place where you live?: Yes      Need for Family Participation in Patient Care: Yes (Comment) Care giver support system in place?: Yes (comment) Current home services: Home PT Criminal Activity/Legal Involvement Pertinent to Current Situation/Hospitalization: No - Comment as needed  Activities of Daily Living Home Assistive Devices/Equipment:  None ADL Screening (condition at time of admission) Patient's cognitive ability adequate to safely complete daily activities?: Yes Is the patient deaf or have difficulty hearing?: No Does the patient have difficulty seeing, even when wearing glasses/contacts?: No Does the patient have difficulty concentrating, remembering, or making decisions?: No Patient able to express need for assistance with ADLs?: Yes Does the patient have difficulty dressing or bathing?: No Independently performs ADLs?: Yes (appropriate for developmental age) Does the patient have difficulty walking or climbing stairs?: No Weakness of Legs: Both Weakness of Arms/Hands: None  Permission Sought/Granted Permission sought to share information with : Case Manager Permission granted to share information with : Yes, Verbal Permission Granted  Share Information with NAME: case manager           Emotional Assessment Appearance:: Appears stated age Attitude/Demeanor/Rapport: Gracious Affect (typically observed): Accepting Orientation: : Oriented to Self, Oriented to Place, Oriented to  Time, Oriented to Situation Alcohol / Substance Use: Never Used, Not Applicable Psych Involvement: No (comment)  Admission diagnosis:  Vasovagal syncope [R55] Colitis [K52.9] Chemotherapy-induced neutropenia (HCC) [D70.1, T45.1X5A] Chemotherapy induced nausea and vomiting [R11.2, T45.1X5A] Chemotherapy induced neutropenia ([place]) [D70.1, T45.1X5A] Patient Active Problem List   Diagnosis Date Noted   Shingles 05/15/2022   Colitis 05/13/2022   Alcohol abuse 05/12/2022   Gastroesophageal reflux disease 05/12/2022   Irritable bowel syndrome 05/12/2022   Mild cognitive impairment 05/12/2022   Urinary incontinence 05/12/2022   Allergic rhinitis 05/12/2022   Allison major depression  in remission ([place]) 05/12/2022   Allison carcinoma of left breast ([place]) 05/12/2022   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   Acute colitis 05/12/2022   Lactic  acidosis 05/12/2022   Chemotherapy induced neutropenia ([place]) 05/12/2022   Chronic diastolic CHF (congestive heart failure) ([place]) 05/12/2022   Oral mucositis 05/12/2022   Port-A-Cath in place 05/05/2022   Genetic testing 03/06/2022   Family history of breast cancer 02/23/2022   Family history of prostate cancer 02/23/2022   Essential hypertension 02/22/2022   Malignant neoplasm of upper-outer quadrant of left breast in female, estrogen receptor negative ([place]) 02/20/2022   Malignant neoplasm of upper-inner quadrant of left female breast ([place]) 02/20/2022   Right-sided epistaxis 07/19/2017   Anxiety state 01/24/2013   Arthropathy 01/24/2013   Depressive disorder, not elsewhere classified 01/24/2013   Mixed hyperlipidemia 01/24/2013   PCP:  [name], MD Pharmacy:   [facility] [place] [address] Phone: [phone] Fax: [facility] [address], [place] - [place] AT [facility] [place] [address] Phone: [phone] Fax: [phone]     Social Determinants of Health (SDOH) Interventions    Readmission Risk Interventions     No data to display

## 2022-05-16 NOTE — Care Management Important Message (Signed)
Important Message  Patient Details IM Letter given to the Patient. Name: Allison Pierce MRN: [id number] Date of Birth: 05-29-1946   Medicare Important Message Given:  Yes     Kerin Salen 05/16/2022, 11:03 AM

## 2022-05-16 NOTE — Plan of Care (Signed)

## 2022-05-17 ENCOUNTER — Ambulatory Visit: Payer: Medicare Other | Admitting: Physician Assistant

## 2022-05-17 DIAGNOSIS — R55 Syncope and collapse: Secondary | ICD-10-CM | POA: Diagnosis not present

## 2022-05-17 DIAGNOSIS — K529 Noninfective gastroenteritis and colitis, unspecified: Secondary | ICD-10-CM | POA: Diagnosis not present

## 2022-05-17 DIAGNOSIS — R197 Diarrhea, unspecified: Secondary | ICD-10-CM | POA: Diagnosis not present

## 2022-05-17 DIAGNOSIS — R112 Nausea with vomiting, unspecified: Secondary | ICD-10-CM | POA: Diagnosis not present

## 2022-05-17 LAB — CBC WITH DIFFERENTIAL/PLATELET
Abs Immature Granulocytes: 2.5 10*3/uL — ABNORMAL HIGH (ref 0.00–0.07)
Band Neutrophils: 2 %
Basophils Absolute: 0 10*3/uL (ref 0.0–0.1)
Basophils Relative: 0 %
Eosinophils Absolute: 0 10*3/uL (ref 0.0–0.5)
Eosinophils Relative: 0 %
HCT: 27 % — ABNORMAL LOW (ref 36.0–46.0)
Hemoglobin: 8.8 g/dL — ABNORMAL LOW (ref 12.0–15.0)
Lymphocytes Relative: 8 %
Lymphs Abs: 2.2 10*3/uL (ref 0.7–4.0)
MCH: 28.5 pg (ref 26.0–34.0)
MCHC: 32.6 g/dL (ref 30.0–36.0)
MCV: 87.4 fL (ref 80.0–100.0)
Metamyelocytes Relative: 4 %
Monocytes Absolute: 2.2 10*3/uL — ABNORMAL HIGH (ref 0.1–1.0)
Monocytes Relative: 8 %
Myelocytes: 3 %
Neutro Abs: 20.8 10*3/uL — ABNORMAL HIGH (ref 1.7–7.7)
Neutrophils Relative %: 73 %
Platelets: 297 10*3/uL (ref 150–400)
Promyelocytes Relative: 2 %
RBC: 3.09 MIL/uL — ABNORMAL LOW (ref 3.87–5.11)
RDW: 13 % (ref 11.5–15.5)
WBC: 27.7 10*3/uL — ABNORMAL HIGH (ref 4.0–10.5)
nRBC: 0.1 % (ref 0.0–0.2)

## 2022-05-17 LAB — BASIC METABOLIC PANEL
Anion gap: 6 (ref 5–15)
BUN: 8 mg/dL (ref 8–23)
CO2: 20 mmol/L — ABNORMAL LOW (ref 22–32)
Calcium: 8 mg/dL — ABNORMAL LOW (ref 8.9–10.3)
Chloride: 114 mmol/L — ABNORMAL HIGH (ref 98–111)
Creatinine, Ser: 0.91 mg/dL (ref 0.44–1.00)
GFR, Estimated: >60 mL/min (ref >60)
Glucose, Bld: 98 mg/dL (ref 70–99)
Potassium: 3.6 mmol/L (ref 3.5–5.1)
Sodium: 140 mmol/L (ref 135–145)

## 2022-05-17 LAB — CULTURE, BLOOD (ROUTINE X 2)
Culture: NO GROWTH
Culture: NO GROWTH
Special Requests: ADEQUATE

## 2022-05-17 LAB — MAGNESIUM: Magnesium: 2.1 mg/dL (ref 1.7–2.4)

## 2022-05-17 NOTE — Progress Notes (Signed)
Physical Therapy Treatment Patient Details Name: Allison Pierce MRN: [id number] DOB: 01-30-1946 Today's Date: 05/17/2022   History of Present Illness 76 yo female admitted with syncope, colitis, chemo induced neutropenia. Hx of falls, lymphedema, CHF, met breast cancer s/p mastectomy    PT Comments    Pt tolerates BLE strengthening exercises, cued for decreased momentum. Pt ambulates around room ~40 ft x2 with seated rest break between. Pt using RW, good steadiness, no overt LOB. Pt on RA with SpO2 97% and dyspnea 3/4. Pt returns to supine at EOS due to fatigue, spouse at bedside.   Recommendations for follow up therapy are one component of a multi-disciplinary discharge planning process, led by the attending physician.  Recommendations may be updated based on patient status, additional functional criteria and insurance authorization.  Follow Up Recommendations  Home health PT     Assistance Recommended at Discharge PRN  Patient can return home with the following     Equipment Recommendations  None recommended by PT    Recommendations for Other Services       Precautions / Restrictions Precautions Precautions: Fall Restrictions Weight Bearing Restrictions: No     Mobility  Bed Mobility Overal bed mobility: Modified Independent   Transfers Overall transfer level: Needs assistance Equipment used: Rolling walker (2 wheels) Transfers: Sit to/from Stand Sit to Stand: Supervision  General transfer comment: supv for safety    Ambulation/Gait Ambulation/Gait assistance: Supervision Gait Distance (Feet): 40 Feet (x2) Assistive device: Rolling walker (2 wheels) Gait Pattern/deviations: Step-through pattern, Decreased stride length Gait velocity: decreased  General Gait Details: supv for safety, slow gait speed, dyspnea 3/4 with SpO2 97% on RA, good steadiness without LOB with turns and direction changes   Stairs             Wheelchair Mobility     Modified Rankin (Stroke Patients Only)       Balance Overall balance assessment: Needs assistance, History of Falls Sitting-balance support: Feet supported Sitting balance-Leahy Scale: Good Sitting balance - Comments: seated EOB  Standing balance support: Bilateral upper extremity supported, During functional activity, Reliant on assistive device for balance Standing balance-Leahy Scale: Fair     Cognition Arousal/Alertness: Awake/alert Behavior During Therapy: WFL for tasks assessed/performed Overall Cognitive Status: Within Functional Limits for tasks assessed         Exercises General Exercises - Lower Extremity Long Arc Quad: Seated, AROM, Strengthening, Both, 15 reps Hip Flexion/Marching: Seated, AROM, Strengthening, Both, 15 reps Mini-Sqauts: 5 reps (2 sets)    General Comments        Pertinent Vitals/Pain Pain Assessment Pain Assessment: No/denies pain    Home Living                          Prior Function            PT Goals (current goals can now be found in the care plan section) Acute Rehab PT Goals Patient Stated Goal: none stated PT Goal Formulation: With patient Time For Goal Achievement: 05/27/22 Potential to Achieve Goals: Good Progress towards PT goals: Progressing toward goals    Frequency    Min 3X/week      PT Plan Current plan remains appropriate    Co-evaluation              AM-PAC PT "6 Clicks" Mobility   Outcome Measure  Help needed turning from your back to your side while in a flat bed without using bedrails?: None  Help needed moving from lying on your back to sitting on the side of a flat bed without using bedrails?: None Help needed moving to and from a bed to a chair (including a wheelchair)?: A Little Help needed standing up from a chair using your arms (e.g., wheelchair or bedside chair)?: A Little Help needed to walk in hospital room?: A Little Help needed climbing 3-5 steps with a railing? : A  Little 6 Click Score: 20    End of Session   Activity Tolerance: Patient tolerated treatment well Patient left: in bed;with call bell/phone within reach;with bed alarm set;with family/visitor present Nurse Communication: Mobility status PT Visit Diagnosis: Muscle weakness (generalized) (M62.81);Unsteadiness on feet (R26.81)     Time: 7949-9718 PT Time Calculation (min) (ACUTE ONLY): 24 min  Charges:  $Gait Training: 8-22 mins $Therapeutic Exercise: 8-22 mins                      Tori Katrese Shell PT, DPT 05/17/22, 4:44 PM

## 2022-05-17 NOTE — Progress Notes (Signed)
[facility]  PROGRESS NOTE  Allison Pierce GBT:[id number] DOB: 06-15-1946 DOA: 05/11/2022 PCP: [name], MD   Brief HPI:   76 year old female with history of left breast cancer status postmastectomy, currently on chemotherapy (started on first chemotherapy on 05/05/2022), chronic diastolic heart failure, GERD, hyperlipidemia, hypertension presented with vomiting and multiple episodes of syncope with concern for seizure.  On presentation, patient was found to be volume depleted with lactic acidosis along with neutropenia.  Chest x-ray showed no evidence of any focal infiltrates.  CTA chest was negative for PE.  CT of abdomen and pelvis with contrast showed possible infectious or inflammatory colitis involving the distal descending and sigmoid colon with no obstruction or abscess.  She was started on IV fluids and antibiotics.  Oral intake has remained poor.    Subjective   Patient seen and examined, denies any complaints.   Assessment/Plan:    Syncopal episode -Likely vasovagal episode causing syncope -Questionable seizure however EEG was negative, no history of seizures and no seizures noted in the hospital since admission -IV fluids were discontinued -PT recommends home health PT -Had recent echocardiogram from 5/23 so it was not repeated -Echocardiogram showed EF of 60 to 71%, grade 2 diastolic dysfunction  Chemotherapy induced severe neutropenia -WBC 0.4 on admission -Started on daily Neupogen injections -WBC is now elevated to 27,000 -She is off Neupogen  Diarrhea -Initial CT scan of the abdomen showed possibility of colitis, patient was neutropenic so she was started on cefepime and metronidazole -Neutropenia has resolved -Diarrhea slowly improving, started on scheduled Lomotil and as needed Imodium -Continue cefepime and Flagyl  Herpes zoster -Patient has single dermatomal vesicular rash in the right subcostal region -Started on Valtrex 1 g 3 times  daily for 7 days -Continue contact and airborne isolation  Oral mucositis -Secondary to chemotherapy -Continue Magic mouthwash  Chronic diastolic CHF -Well compensated -Continue strict intake and output  Hypokalemia -Replete   Hypomagnesemia -Replete   Essential hypertension -Blood pressure intermittently on the higher side.  Continue losartan.   Lobular carcinoma of left breast -Status post left mastectomy in 04/2022.  Started chemotherapy on 05/05/2022.  Oncology following   Depression -continue bupropion and venlafaxine along with clonazepam.  Outpatient follow-up with psychiatry/PCP   Physical deconditioning -PT recommends home with PT  Medications     buPROPion  300 mg Oral q morning   clonazePAM  1 mg Oral QHS   diphenoxylate-atropine  1 tablet Oral BID   enoxaparin (LOVENOX) injection  40 mg Subcutaneous Daily   losartan  50 mg Oral Daily   meloxicam  15 mg Oral Daily   traZODone  225 mg Oral QHS   valACYclovir  1,000 mg Oral TID   venlafaxine XR  75 mg Oral Q breakfast     Data Reviewed:   CBG:  Recent Labs  Lab 05/11/22 2359  GLUCAP 105*    SpO2: 93 % O2 Flow Rate (L/min): 2 L/min    Vitals:   05/16/22 2148 05/16/22 2241 05/17/22 0449 05/17/22 1354  BP: (!) 176/85 (!) 151/71 (!) 157/88 (!) 153/84  Pulse: 90 89 89 90  Resp:   17 17  Temp:   99 F (37.2 C) 98.1 F (36.7 C)  TempSrc:   Oral Oral  SpO2: 95%  97% 93%  Weight:      Height:          Data Reviewed:  Basic Metabolic Panel: Recent Labs  Lab 05/13/22 0603 05/14/22 0711 05/15/22 0509 05/16/22  0507 05/17/22 0511  NA 137 138 133* 139 140  K 3.5 2.9* 3.5 3.2* 3.6  CL 105 109 104 111 114*  CO2 24 21* 22 22 20*  GLUCOSE 122* 110* 121* 113* 98  BUN 7* 7* 5* 8 8  CREATININE 0.74 0.84 0.82 0.89 0.91  CALCIUM 8.4* 7.8* 7.8* 8.0* 8.0*  MG 1.7 1.6* 2.0 2.2 2.1    CBC: Recent Labs  Lab 05/13/22 0603 05/14/22 0711 05/15/22 0509 05/16/22 0507 05/17/22 0511  WBC 0.9*  4.7 24.0* 30.1* 27.7*  NEUTROABS 0.1* 1.6* 13.4* 16.9* 20.8*  HGB 9.5* 9.0* 9.5* 8.9* 8.8*  HCT 28.7* 27.4* 28.7* 27.5* 27.0*  MCV 86.2 85.9 85.7 87.0 87.4  PLT 182 238 312 310 297    LFT Recent Labs  Lab 05/12/22 0009 05/13/22 0603 05/14/22 0711  AST '29 19 20  '$ ALT 35 27 22  ALKPHOS 69 63 60  BILITOT 0.9 0.7 0.4  PROT 6.1* 5.7* 5.1*  ALBUMIN 3.4* 2.9* 2.7*     Antibiotics: Anti-infectives (From admission, onward)    Start     Dose/Rate Route Frequency Ordered Stop   05/15/22 1000  valACYclovir (VALTREX) tablet 1,000 mg        1,000 mg Oral 3 times daily 05/15/22 0837 05/22/22 0959   05/12/22 1600  ceFEPIme (MAXIPIME) 2 g in sodium chloride 0.9 % 100 mL IVPB        2 g 200 mL/hr over 30 Minutes Intravenous Every 12 hours 05/12/22 0932     05/12/22 0330  metroNIDAZOLE (FLAGYL) IVPB 500 mg        500 mg 100 mL/hr over 60 Minutes Intravenous Every 12 hours 05/12/22 0327     05/12/22 0215  ceFEPIme (MAXIPIME) 2 g in sodium chloride 0.9 % 100 mL IVPB        2 g 200 mL/hr over 30 Minutes Intravenous  Once 05/12/22 0202 05/12/22 0310        DVT prophylaxis: Lovenox  Code Status: Full code  Family Communication: Discussed with patient's husband at bedside   CONSULTS oncology   Objective    Physical Examination:   General-appears in no acute distress Heart-S1-S2, regular, no murmur auscultated Lungs-clear to auscultation bilaterally, no wheezing or crackles auscultated Abdomen-soft, nontender, no organomegaly Extremities-no edema in the lower extremities Neuro-alert, oriented x3, no focal deficit noted Skin-vesicular rash noted in the right subcostal region with scattered erythema  Status is: Inpatient:             Oswald Hillock   [facility] If 7PM-7AM, please contact night-coverage at [email or web address], Office  [phone]   05/17/2022, 6:35 PM  LOS: 4 days

## 2022-05-18 ENCOUNTER — Encounter: Payer: Self-pay | Admitting: *Deleted

## 2022-05-18 DIAGNOSIS — D701 Agranulocytosis secondary to cancer chemotherapy: Secondary | ICD-10-CM | POA: Diagnosis not present

## 2022-05-18 DIAGNOSIS — R112 Nausea with vomiting, unspecified: Secondary | ICD-10-CM | POA: Diagnosis not present

## 2022-05-18 DIAGNOSIS — R55 Syncope and collapse: Secondary | ICD-10-CM | POA: Diagnosis not present

## 2022-05-18 DIAGNOSIS — K529 Noninfective gastroenteritis and colitis, unspecified: Secondary | ICD-10-CM | POA: Diagnosis not present

## 2022-05-18 MED ORDER — ONDANSETRON HCL 4 MG/2ML IJ SOLN: 4.0000 mg | Freq: Four times a day (QID) | INTRAMUSCULAR | Status: DC | PRN

## 2022-05-18 MED ORDER — DIPHENOXYLATE-ATROPINE 2.5-0.025 MG PO TABS: 1.0000 | ORAL_TABLET | Freq: Two times a day (BID) | ORAL | 0 refills | Status: DC

## 2022-05-18 MED ORDER — METRONIDAZOLE 500 MG PO TABS: 500.0000 mg | ORAL_TABLET | Freq: Two times a day (BID) | ORAL | 0 refills | Status: DC

## 2022-05-18 MED ORDER — HYDROCORTISONE 1 % EX CREA
TOPICAL_CREAM | Freq: Three times a day (TID) | CUTANEOUS | Status: DC | PRN
Start: 2022-05-18 — End: 2022-05-18
  Filled 2022-05-18: qty 28

## 2022-05-18 MED ORDER — VALACYCLOVIR HCL 1 G PO TABS: 1000.0000 mg | ORAL_TABLET | Freq: Three times a day (TID) | ORAL | 0 refills | Status: DC

## 2022-05-18 MED ORDER — LOPERAMIDE HCL 2 MG PO CAPS: 4.0000 mg | ORAL_CAPSULE | ORAL | 0 refills | Status: DC | PRN

## 2022-05-18 MED ORDER — ONDANSETRON HCL 4 MG PO TABS: 4.0000 mg | ORAL_TABLET | Freq: Four times a day (QID) | ORAL | Status: DC | PRN

## 2022-05-18 NOTE — Plan of Care (Signed)
Discharge instructions reviewed with patient and husband, questions answered, verbalized understanding.  Patient transported to main entrance via wheelchair to be taken home by husband.   

## 2022-05-18 NOTE — Discharge Summary (Signed)
Physician Discharge Summary   Patient: Allison Pierce MRN: [id number] DOB: 02-21-1946  Admit date:     05/11/2022  Discharge date: 05/18/22  Discharge Physician: Oswald Hillock   PCP: [name], MD   Recommendations at discharge:    Follow-up oncology on Monday, 05/22/2022  Discharge Diagnoses: Principal Problem:   Convulsive syncope Active Problems:   Chemotherapy induced neutropenia (HCC)   Acute colitis   Lactic acidosis   Oral mucositis   Chronic diastolic CHF (congestive heart failure) (HCC)   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HCC)   Essential hypertension   Gastroesophageal reflux disease   Colitis   Shingles  Resolved Problems:   Chemotherapy induced nausea and vomiting  Hospital Course:  76 year old female with history of left breast cancer status postmastectomy, currently on chemotherapy (started on first chemotherapy on 05/05/2022), chronic diastolic heart failure, GERD, hyperlipidemia, hypertension presented with vomiting and multiple episodes of syncope with concern for seizure.  On presentation, patient was found to be volume depleted with lactic acidosis along with neutropenia.  Chest x-ray showed no evidence of any focal infiltrates.  CTA chest was negative for PE.  CT of abdomen and pelvis with contrast showed possible infectious or inflammatory colitis involving the distal descending and sigmoid colon with no obstruction or abscess.  She was started on IV fluids and antibiotics.  Oral intake has remained poor.  Assessment and Plan:  Convulsive syncope  -Likely vasovagal episode causing syncope -Questionable seizure however EEG was negative, no history of seizures and no seizures noted in the hospital since admission -IV fluids were discontinued -PT recommends home health PT -Had recent echocardiogram from 5/23 so it was not repeated -Echocardiogram showed EF of 60 to 08%, grade 2 diastolic dysfunction -No further episodes of syncope or seizure in  the hospital   Chemotherapy induced severe neutropenia -WBC 0.4 on admission -Started on daily Neupogen injections -WBC is now elevated to 27,000 -She is off Neupogen   colitis -Initial CT scan of the abdomen showed possibility of colitis, patient was neutropenic so she was started on cefepime and metronidazole -Neutropenia has resolved -Diarrhea slowly improving, started on scheduled Lomotil and as needed Imodium -She was treated with cefepime and Flagyl from 05/12/2022 to 05/18/2022 -We will discharge on Flagyl 500 mg twice daily for 7 more days   Herpes zoster -Patient has single dermatomal vesicular rash in the right subcostal region -Started on Valtrex 1 g 3 times daily for 7 days -Continue contact and airborne isolation -We will discharge on Valtrex 1 g 3 times daily for 4 more days   Oral mucositis -Secondary to chemotherapy -Improved with Magic mouthwash   Chronic diastolic CHF -Well compensated    Hypokalemia -Replete   Hypomagnesemia -Replete   Essential hypertension -Continue losartan.   Lobular carcinoma of left breast -Status post left mastectomy in 04/2022.  Started chemotherapy on 05/05/2022.  Oncology following   Depression -continue bupropion and venlafaxine along with clonazepam.  Outpatient follow-up with psychiatry/PCP   Physical deconditioning -PT recommends home with PT        Consultants: Oncology Procedures performed:   Disposition: Home Diet recommendation:  Regular diet DISCHARGE MEDICATION: Allergies as of 05/18/2022       Reactions   Gadolinium Derivatives Itching, Other (See Comments)   Redness and itching, skins feels like it was burning. In the future pt will need pre med. Redness and itching, skins feels like it was burning. In the future pt will need pre med.   Atorvastatin  Calcium Other (See Comments)   Azelaic Acid Other (See Comments)   Fluoxetine Other (See Comments)   Oxybutynin Other (See Comments)   Prozac  [fluoxetine Hcl] Other (See Comments)   "COULDN'T LIFT HEAD AND GET OUT OF BED"   Ritalin [methylphenidate] Other (See Comments)   Rosuvastatin Other (See Comments)   Lyrica [pregabalin] Anxiety   "FELT WEIRD"        Medication List     TAKE these medications    B Complex-Vitamin C Caps Take 1 capsule by mouth daily.   buPROPion 300 MG 24 hr tablet Commonly known as: WELLBUTRIN XL Take 300 mg by mouth every morning.   calcium carbonate 500 MG chewable tablet Commonly known as: TUMS - dosed in mg elemental calcium Chew 1 tablet by mouth daily as needed for indigestion or heartburn.   cholecalciferol 25 MCG (1000 UNIT) tablet Commonly known as: VITAMIN D3 Take 1,000 Units by mouth daily.   clonazePAM 1 MG tablet Commonly known as: KLONOPIN Take 1 mg by mouth at bedtime.   dexamethasone 4 MG tablet Commonly known as: DECADRON Take 1 tablet (4 mg total) by mouth daily. Take 1 tablet day before chemo and 1 tablet day after chemo with food   diphenoxylate-atropine 2.5-0.025 MG tablet Commonly known as: LOMOTIL Take 1 tablet by mouth 2 (two) times daily.   fluticasone 50 MCG/ACT nasal spray Commonly known as: FLONASE Place 1 spray into both nostrils daily as needed for allergies.   lidocaine-prilocaine cream Commonly known as: EMLA Apply to affected area once   loperamide 2 MG capsule Commonly known as: IMODIUM Take 2 capsules (4 mg total) by mouth as needed for diarrhea or loose stools.   losartan 25 MG tablet Commonly known as: COZAAR Take 50 mg by mouth daily.   meloxicam 15 MG tablet Commonly known as: MOBIC Take 15 mg by mouth daily.   metroNIDAZOLE 500 MG tablet Commonly known as: Flagyl Take 1 tablet (500 mg total) by mouth 2 (two) times daily for 7 days.   ondansetron 8 MG tablet Commonly known as: Zofran Take 1 tablet (8 mg total) by mouth 2 (two) times daily as needed for refractory nausea / vomiting.   Potassium 95 MG Tabs Take 95 mg by mouth  daily.   prochlorperazine 10 MG tablet Commonly known as: COMPAZINE Take 1 tablet (10 mg total) by mouth every 6 (six) hours as needed (Nausea or vomiting).   traZODone 150 MG tablet Commonly known as: DESYREL Take 225 mg by mouth at bedtime.   valACYclovir 1000 MG tablet Commonly known as: VALTREX Take 1 tablet (1,000 mg total) by mouth 3 (three) times daily.   venlafaxine XR 75 MG 24 hr capsule Commonly known as: EFFEXOR-XR Take 75 mg by mouth daily with breakfast.   vitamin B-12 500 MCG tablet Commonly known as: CYANOCOBALAMIN Take 500 mcg by mouth daily.        [place] [place] [facility] Follow up.   Specialty: Home Health Services Why: A representative from [facility] will contact you within 24-48 hours after discharge from the hospital. Contact information: [place] [address] [phone]                Discharge Exam: Filed Weights   05/14/22 0525 05/15/22 1348 05/16/22 0500  Weight: 75.9 kg 71.1 kg 69.5 kg   General-appears in no acute distress Heart-S1-S2, regular, no murmur auscultated Lungs-clear to auscultation bilaterally,  no wheezing or crackles auscultated Abdomen-soft, nontender, no organomegaly Extremities-no edema in the lower extremities Neuro-alert, oriented x3, no focal deficit noted  Condition at discharge: good  The results of significant diagnostics from this hospitalization (including imaging, microbiology, ancillary and laboratory) are listed below for reference.   Imaging Studies: EEG adult  Result Date: 2022-06-02 Lora Havens, MD     06/02/2022  4:40 PM Patient Name: Allison Pierce MRN: [id number] Epilepsy Attending: Lora Havens Referring Physician/Provider: Vernelle Emerald, MD Date: 02-Jun-2022 Duration: 22.07 mins Patient history: 75yo F with multiple episodes of witnessed loss of consciousness followed by shaking that the patient has been  perceived as "seizures". EEG to evaluate for seizure. Level of alertness: Awake, asleep AEDs during EEG study: Clonazepam Technical aspects: This EEG study was done with scalp electrodes positioned according to the 10-20 International system of electrode placement. Electrical activity was reviewed with band pass filter of 1-'70Hz'$ , sensitivity of 7 uV/mm, display speed of 54m/sec with a '60Hz'$  notched filter applied as appropriate. EEG data were recorded continuously and digitally stored.  Video monitoring was available and reviewed as appropriate. Description: The posterior dominant rhythm consists of 9-10 Hz activity of moderate voltage (25-35 uV) seen predominantly in posterior head regions, symmetric and reactive to eye opening and eye closing. Sleep was characterized by vertex waves, maximal frontocentral region. Hyperventilation and photic stimulation were not performed.   IMPRESSION: This study is within normal limits. No seizures or epileptiform discharges were seen throughout the recording. A normal interictal EEG does not exclude nor support the diagnosis of epilepsy. PLora Havens  CT Angio Chest PE W/Cm &/Or Wo Cm  Result Date: 809-01-23CLINICAL DATA:  Pulmonary embolism (PE) suspected, high prob; Abdominal pain, acute, nonlocalized. Breast cancer. EXAM: CT ANGIOGRAPHY CHEST CT ABDOMEN AND PELVIS WITH CONTRAST TECHNIQUE: Multidetector CT imaging of the chest was performed using the standard protocol during bolus administration of intravenous contrast. Multiplanar CT image reconstructions and MIPs were obtained to evaluate the vascular anatomy. Multidetector CT imaging of the abdomen and pelvis was performed using the standard protocol during bolus administration of intravenous contrast. RADIATION DOSE REDUCTION: This exam was performed according to the departmental dose-optimization program which includes automated exposure control, adjustment of the mA and/or kV according to patient size and/or  use of iterative reconstruction technique. CONTRAST:  1030mOMNIPAQUE IOHEXOL 350 MG/ML SOLN COMPARISON:  04/26/2022 FINDINGS: CTA CHEST FINDINGS Cardiovascular: Adequate opacification of the pulmonary arterial tree. No intraluminal filling defect identified to suggest acute pulmonary embolism. Central pulmonary arteries are of normal caliber. No significant coronary artery calcification. Cardiac size within normal limits. No pericardial effusion. Mild atherosclerotic calcification within the thoracic aorta. No aortic aneurysm. Right internal jugular chest port is in place with its tip at the innominate vein which is thrombosed. Contrast instilled via the right upper extremity fills numerous due chest wall collaterals, ultimately filling the azygous, left internal mammary, and central left upper extremity veins. Mediastinum/Nodes: Thyroid unremarkable. No pathologic thoracic adenopathy. Esophagus unremarkable. Small hiatal hernia. Lungs/Pleura: Lungs are clear. No pleural effusion or pneumothorax. Musculoskeletal: Right breast implant and left breast tissue expander are in place. Status post left mastectomy. Mixed lytic and sclerotic lesion within the mid body of the sternum and lytic lesions within the anterior aspect of T3 and T4 and right lateral aspect of T10 unchanged in keeping with metastatic disease. Schmorl's node within the T7 vertebral body. Known metastasis within the posterior right seventh rib is not as well visualized  on this examination. Review of the MIP images confirms the above findings. CT ABDOMEN and PELVIS FINDINGS Hepatobiliary: Multiple hepatic hypodensities are again seen, stable since prior examination, the largest of which is compatible with a simple hepatic cyst. No enhancing intrahepatic mass. No intra or extrahepatic biliary ductal dilation. Pancreas: Unremarkable Spleen: Unremarkable Adrenals/Urinary Tract: Adrenal glands are unremarkable. Kidneys are normal, without renal calculi,  focal lesion, or hydronephrosis. Bladder is unremarkable. Stomach/Bowel: There is long segment circumferential bowel wall thickening and pericolonic inflammatory stranding involving the distal descending and and sigmoid colon in keeping with a infectious or inflammatory colitis. Superimposed moderate sigmoid diverticulosis. The stomach, small bowel, and large bowel are otherwise unremarkable. Appendix normal. No free intraperitoneal gas or fluid. No evidence of obstruction. Vascular/Lymphatic: Moderate aortoiliac atherosclerotic calcification. No aortic aneurysm. No pathologic adenopathy within the abdomen and pelvis. Reproductive: Status post hysterectomy. No adnexal masses. Other: No abdominal wall hernia. Musculoskeletal: Stable lytic lesion within the left S1 vertebral body in keeping with osseous metastatic disease. No acute bone abnormality. Review of the MIP images confirms the above findings. IMPRESSION: 1. No pulmonary embolism. No acute intrathoracic pathology identified. 2. 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 3. Long segment infectious or inflammatory colitis involving the distal descending and sigmoid colon. Superimposed moderate sigmoid diverticulosis. No evidence of obstruction or perforation. 4. Stable osseous metastatic disease. Electronically Signed   By: Fidela Salisbury M.D.   On: 05/12/2022 03:23   CT Abdomen Pelvis W Contrast  Result Date: 05/12/2022 CLINICAL DATA:  Pulmonary embolism (PE) suspected, high prob; Abdominal pain, acute, nonlocalized. Breast cancer. EXAM: CT ANGIOGRAPHY CHEST CT ABDOMEN AND PELVIS WITH CONTRAST TECHNIQUE: Multidetector CT imaging of the chest was performed using the standard protocol during bolus administration of intravenous contrast. Multiplanar CT image reconstructions and MIPs were obtained to evaluate the vascular anatomy. Multidetector CT imaging of the abdomen and pelvis was performed using the standard  protocol during bolus administration of intravenous contrast. RADIATION DOSE REDUCTION: This exam was performed according to the departmental dose-optimization program which includes automated exposure control, adjustment of the mA and/or kV according to patient size and/or use of iterative reconstruction technique. CONTRAST:  141m OMNIPAQUE IOHEXOL 350 MG/ML SOLN COMPARISON:  04/26/2022 FINDINGS: CTA CHEST FINDINGS Cardiovascular: Adequate opacification of the pulmonary arterial tree. No intraluminal filling defect identified to suggest acute pulmonary embolism. Central pulmonary arteries are of normal caliber. No significant coronary artery calcification. Cardiac size within normal limits. No pericardial effusion. Mild atherosclerotic calcification within the thoracic aorta. No aortic aneurysm. Right internal jugular chest port is in place with its tip at the innominate vein which is thrombosed. Contrast instilled via the right upper extremity fills numerous due chest wall collaterals, ultimately filling the azygous, left internal mammary, and central left upper extremity veins. Mediastinum/Nodes: Thyroid unremarkable. No pathologic thoracic adenopathy. Esophagus unremarkable. Small hiatal hernia. Lungs/Pleura: Lungs are clear. No pleural effusion or pneumothorax. Musculoskeletal: Right breast implant and left breast tissue expander are in place. Status post left mastectomy. Mixed lytic and sclerotic lesion within the mid body of the sternum and lytic lesions within the anterior aspect of T3 and T4 and right lateral aspect of T10 unchanged in keeping with metastatic disease. Schmorl's node within the T7 vertebral body. Known metastasis within the posterior right seventh rib is not as well visualized on this examination. Review of the MIP images confirms the above findings. CT ABDOMEN and PELVIS FINDINGS Hepatobiliary: Multiple hepatic hypodensities are again seen, stable since  prior examination, the largest of  which is compatible with a simple hepatic cyst. No enhancing intrahepatic mass. No intra or extrahepatic biliary ductal dilation. Pancreas: Unremarkable Spleen: Unremarkable Adrenals/Urinary Tract: Adrenal glands are unremarkable. Kidneys are normal, without renal calculi, focal lesion, or hydronephrosis. Bladder is unremarkable. Stomach/Bowel: There is long segment circumferential bowel wall thickening and pericolonic inflammatory stranding involving the distal descending and and sigmoid colon in keeping with a infectious or inflammatory colitis. Superimposed moderate sigmoid diverticulosis. The stomach, small bowel, and large bowel are otherwise unremarkable. Appendix normal. No free intraperitoneal gas or fluid. No evidence of obstruction. Vascular/Lymphatic: Moderate aortoiliac atherosclerotic calcification. No aortic aneurysm. No pathologic adenopathy within the abdomen and pelvis. Reproductive: Status post hysterectomy. No adnexal masses. Other: No abdominal wall hernia. Musculoskeletal: Stable lytic lesion within the left S1 vertebral body in keeping with osseous metastatic disease. No acute bone abnormality. Review of the MIP images confirms the above findings. IMPRESSION: 1. No pulmonary embolism. No acute intrathoracic pathology identified. 2. 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 3. Long segment infectious or inflammatory colitis involving the distal descending and sigmoid colon. Superimposed moderate sigmoid diverticulosis. No evidence of obstruction or perforation. 4. Stable osseous metastatic disease. Electronically Signed   By: Fidela Salisbury M.D.   On: 05/12/2022 03:23   CT Head Wo Contrast  Result Date: 05/12/2022 CLINICAL DATA:  Head trauma, minor (Age >= 65y) syncope with head injury, questionable seizure EXAM: CT HEAD WITHOUT CONTRAST TECHNIQUE: Contiguous axial images were obtained from the base of the skull through the vertex without  intravenous contrast. RADIATION DOSE REDUCTION: This exam was performed according to the departmental dose-optimization program which includes automated exposure control, adjustment of the mA and/or kV according to patient size and/or use of iterative reconstruction technique. COMPARISON:  Brain MRI 09/29/2021, head CT 09/16/2020 FINDINGS: Brain: No intracranial hemorrhage, mass effect, or midline shift. Stable age related atrophy. No hydrocephalus. The basilar cisterns are patent. Mild periventricular chronic small vessel ischemia. No evidence of territorial infarct or acute ischemia. No extra-axial or intracranial fluid collection. Vascular: Atherosclerosis of skullbase vasculature without hyperdense vessel or abnormal calcification. Skull: No fracture or focal lesion. Sinuses/Orbits: Paranasal sinuses and mastoid air cells are clear. The visualized orbits are unremarkable. Bilateral lens extraction. Other: None. IMPRESSION: 1. No acute intracranial abnormality. No skull fracture. 2. Stable age related atrophy and chronic small vessel ischemia. Electronically Signed   By: Keith Rake M.D.   On: 05/12/2022 01:37   DG Chest Port 1 View  Result Date: 05/12/2022 CLINICAL DATA:  Breast cancer patient, last chemotherapy 1 week ago, nausea and vomiting today with chest pain. EXAM: PORTABLE CHEST 1 VIEW COMPARISON:  Portable chest 09/16/2020, CT chest with contrast 04/26/2022. FINDINGS: Again noted are bilateral breast implants with a tissue expander in place on the left, left axillary surgical clips and a right IJ port catheter with tip at the brachiocephalic/SVC junction. The lungs are clear. No pleural effusion is seen. The cardiomediastinal silhouette is normal apart from slight aortic tortuosity, and there is aortic atherosclerosis. Thoracic spondylosis. IMPRESSION: No evidence of acute chest disease or interval changes. Electronically Signed   By: Telford Nab M.D.   On: 05/12/2022 00:29   CT CHEST  ABDOMEN PELVIS W CONTRAST  Result Date: 04/27/2022 CLINICAL DATA:  Left breast cancer, initial staging status post left mastectomy * Tracking Code: BO * EXAM: CT CHEST, ABDOMEN, AND PELVIS WITH CONTRAST TECHNIQUE: Multidetector CT imaging of the chest, abdomen and  pelvis was performed following the standard protocol during bolus administration of intravenous contrast. RADIATION DOSE REDUCTION: This exam was performed according to the departmental dose-optimization program which includes automated exposure control, adjustment of the mA and/or kV according to patient size and/or use of iterative reconstruction technique. CONTRAST:  185m OMNIPAQUE IOHEXOL 300 MG/ML SOLN additional oral enteric contrast COMPARISON:  CT abdomen pelvis, 09/19/2020 FINDINGS: CT CHEST FINDINGS Cardiovascular: Right chest port catheter. There appears to be nonocclusive thrombus or fibrin sheath associated with the port catheter in the right internal jugular vein (series 2, image 6). Aortic atherosclerosis. Normal heart size. No pericardial effusion. Mediastinum/Nodes: No enlarged mediastinal, hilar, or axillary lymph nodes. Status post left axillary lymph node dissection. Thyroid gland, trachea, and esophagus demonstrate no significant findings. Lungs/Pleura: Lungs are clear. No pleural effusion or pneumothorax. Musculoskeletal: Status post left mastectomy with tissue expander. Pre-existing right breast prosthesis. Subtle superior endplate deformity of the T4 vertebral body (series 6, image 122). Subtle lytic lesion of the sternal body (series 2, image 30, series 6, image 121). Probable small lytic lesion of the anterior T3 vertebral body (series 6, image 124). Lytic lesion versus inferior endplate Schmorl deformity of the T7 vertebral body (series 6, image 119). Lytic lesion of the right aspect of the T10 vertebral body (series 2, image 44). Very subtle lytic lesion of the posterior right seventh rib (series 2, image 33). CT ABDOMEN  PELVIS FINDINGS Hepatobiliary: No solid liver abnormality is seen. Multiple low-attenuation liver lesions, largest of which are clearly simple cysts, several too small to characterize although all unchanged and almost certainly simple, benign cysts. No gallstones, gallbladder wall thickening, or biliary dilatation. Pancreas: Unremarkable. No pancreatic ductal dilatation or surrounding inflammatory changes. Spleen: Normal in size without significant abnormality. Adrenals/Urinary Tract: Adrenal glands are unremarkable. Kidneys are normal, without renal calculi, solid lesion, or hydronephrosis. Bladder is unremarkable. Stomach/Bowel: Stomach is within normal limits. Appendix appears normal. No evidence of bowel wall thickening, distention, or inflammatory changes. Sigmoid diverticula. Vascular/Lymphatic: Aortic atherosclerosis. No enlarged abdominal or pelvic lymph nodes. Reproductive: Status post hysterectomy. Other: No abdominal wall hernia or abnormality. No ascites. Musculoskeletal: No acute osseous findings. Lytic lesion of the left aspect of the S1 segment (series 2, image 93). IMPRESSION: 1. Status post left mastectomy and axillary lymph node dissection. 2. Lytic osseous lesions of the sternal body, posterior right seventh rib, multiple thoracic vertebral bodies, and the sacrum, consistent with osseous metastatic disease. 3. Subtle superior endplate deformity of the T4 vertebral body, age indeterminate, without obviously pathologic features but in general highly suspicious for underlying metastasis given the presence of osseous metastatic disease elsewhere. Correlate for acute point tenderness. 4. No evidence of lymphadenopathy or soft tissue metastatic disease within the chest, abdomen, or pelvis. 5. Nonocclusive thrombus or fibrin sheath associated with the port catheter in the right internal jugular vein. These results will be called to the ordering clinician or representative by the Radiologist Assistant,  and communication documented in the PACS or [employer] Aortic Atherosclerosis (ICD10-I70.0). Electronically Signed   By: ADelanna AhmadiM.D.   On: 04/27/2022 08:30   NM Bone Scan Whole Body  Result Date: 04/25/2022 CLINICAL DATA:  Multifocal left breast LCIS EXAM: NUCLEAR MEDICINE WHOLE BODY BONE SCAN TECHNIQUE: Whole body anterior and posterior images were obtained approximately 3 hours after intravenous injection of radiopharmaceutical. RADIOPHARMACEUTICALS:  20.0 mCi Technetium-961mDP IV COMPARISON:  Breast MRI 03/03/2022, CT abdomen pelvis 09/19/2020 FINDINGS: Anterior and posterior whole body planar images are obtained after radiotracer  administration. Physiologic excretion of radiotracer within the kidneys and bladder. Degenerative type activity is seen at the bilateral acromioclavicular joints, cervical spine, right wrist, and left foot. There is focal intense radiotracer uptake within the mid sternal body. This corresponds to an area of increased T2 signal and enhancement on recent breast MRI, and is concerning for metastatic disease. Lower level indeterminate activity is seen within the right parietal region of the calvarium, right posterior seventh rib, left sternoclavicular junction, and approximate T5 vertebral body. IMPRESSION: 1. Abnormal radiotracer uptake within the sternum, concerning for metastatic disease. 2. Lower level indeterminate activity within the right parietal region of the calvarium, right posterior seventh rib, T5 vertebral body, and left sternoclavicular joint as above. Metastatic disease is suspected, and radiographic correlation may be useful. 3. Degenerative type activity of the bilateral shoulders, cervical spine, right wrist, and left foot as above. Electronically Signed   By: Randa Ngo M.D.   On: 04/25/2022 13:17    Microbiology: Results for orders placed or performed during the hospital encounter of 05/11/22  Culture, blood (routine x 2)     Status: None    Collection Time: 05/12/22  2:30 AM   Specimen: BLOOD  Result Value Ref Range Status   Specimen Description   Final    BLOOD BLOOD RIGHT HAND Performed at [facility] [address] [place], [place] [address]    Special Requests   Final    BOTTLES DRAWN AEROBIC AND ANAEROBIC Blood Culture adequate volume Performed at [facility] [address] [place], [place] [address]    Culture   Final    NO GROWTH 5 DAYS Performed at [facility], [place] [address] [place], [place] [address]    Report Status 05/17/2022 FINAL  Final  Culture, blood (routine x 2)     Status: None   Collection Time: 05/12/22  2:40 AM   Specimen: BLOOD  Result Value Ref Range Status   Specimen Description   Final    BLOOD RIGHT ANTECUBITAL Performed at [facility] [address], [place], [place] [address]    Special Requests   Final    BOTTLES DRAWN AEROBIC AND ANAEROBIC Blood Culture results may not be optimal due to an excessive volume of blood received in culture bottles Performed at [facility] [address], [place], [place] [address]    Culture   Final    NO GROWTH 5 DAYS Performed at [facility], [place] [address] [place],  [address]    Report Status 05/17/2022 FINAL  Final    Labs: CBC: Recent Labs  Lab 05/13/22 0603 05/14/22 0711 05/15/22 0509 05/16/22 0507 05/17/22 0511  WBC 0.9* 4.7 24.0* 30.1* 27.7*  NEUTROABS 0.1* 1.6* 13.4* 16.9* 20.8*  HGB 9.5* 9.0* 9.5* 8.9* 8.8*  HCT 28.7* 27.4* 28.7* 27.5* 27.0*  MCV 86.2 85.9 85.7 87.0 87.4  PLT 182 238 312 310 378   Basic Metabolic Panel: Recent Labs  Lab 05/13/22 0603 05/14/22 0711 05/15/22 0509 05/16/22 0507 05/17/22 0511  NA 137 138 133* 139 140  K 3.5 2.9* 3.5 3.2* 3.6  CL 105 109 104 111 114*  CO2 24 21* 22 22 20*  GLUCOSE 122* 110* 121* 113* 98  BUN 7* 7* 5* 8 8  CREATININE 0.74 0.84 0.82 0.89 0.91  CALCIUM 8.4* 7.8* 7.8*  8.0* 8.0*  MG 1.7 1.6* 2.0 2.2 2.1   Liver Function Tests: Recent Labs  Lab 05/12/22  0009 05/13/22 0603 05/14/22 0711  AST '29 19 20  '$ ALT 35 27 22  ALKPHOS 69 63 60  BILITOT 0.9 0.7 0.4  PROT 6.1* 5.7* 5.1*  ALBUMIN 3.4* 2.9* 2.7*   CBG: Recent Labs  Lab 05/11/22 2359  GLUCAP 105*    Discharge time spent: greater than 30 minutes.  Signed: Oswald Hillock, MD [facility] 05/18/2022

## 2022-05-19 ENCOUNTER — Telehealth: Payer: Self-pay | Admitting: Hematology and Oncology

## 2022-05-19 NOTE — Telephone Encounter (Signed)
Scheduled per 8/18 in basket, attempted to call but voicemail not set up, will try to call again

## 2022-05-20 DIAGNOSIS — B029 Zoster without complications: Secondary | ICD-10-CM | POA: Diagnosis not present

## 2022-05-20 DIAGNOSIS — K219 Gastro-esophageal reflux disease without esophagitis: Secondary | ICD-10-CM | POA: Diagnosis not present

## 2022-05-20 DIAGNOSIS — E876 Hypokalemia: Secondary | ICD-10-CM | POA: Diagnosis not present

## 2022-05-20 DIAGNOSIS — E8729 Other acidosis: Secondary | ICD-10-CM | POA: Diagnosis not present

## 2022-05-20 DIAGNOSIS — K1230 Oral mucositis (ulcerative), unspecified: Secondary | ICD-10-CM | POA: Diagnosis not present

## 2022-05-20 DIAGNOSIS — F419 Anxiety disorder, unspecified: Secondary | ICD-10-CM | POA: Diagnosis not present

## 2022-05-20 DIAGNOSIS — I5031 Acute diastolic (congestive) heart failure: Secondary | ICD-10-CM | POA: Diagnosis not present

## 2022-05-20 DIAGNOSIS — Z87440 Personal history of urinary (tract) infections: Secondary | ICD-10-CM | POA: Diagnosis not present

## 2022-05-20 DIAGNOSIS — I110 Hypertensive heart disease with heart failure: Secondary | ICD-10-CM | POA: Diagnosis not present

## 2022-05-20 DIAGNOSIS — C50912 Malignant neoplasm of unspecified site of left female breast: Secondary | ICD-10-CM | POA: Diagnosis not present

## 2022-05-20 DIAGNOSIS — C7951 Secondary malignant neoplasm of bone: Secondary | ICD-10-CM | POA: Diagnosis not present

## 2022-05-20 DIAGNOSIS — E785 Hyperlipidemia, unspecified: Secondary | ICD-10-CM | POA: Diagnosis not present

## 2022-05-20 DIAGNOSIS — K529 Noninfective gastroenteritis and colitis, unspecified: Secondary | ICD-10-CM | POA: Diagnosis not present

## 2022-05-20 DIAGNOSIS — T451X5D Adverse effect of antineoplastic and immunosuppressive drugs, subsequent encounter: Secondary | ICD-10-CM | POA: Diagnosis not present

## 2022-05-20 DIAGNOSIS — F32A Depression, unspecified: Secondary | ICD-10-CM | POA: Diagnosis not present

## 2022-05-20 DIAGNOSIS — D701 Agranulocytosis secondary to cancer chemotherapy: Secondary | ICD-10-CM | POA: Diagnosis not present

## 2022-05-22 ENCOUNTER — Inpatient Hospital Stay: Payer: Medicare Other

## 2022-05-22 ENCOUNTER — Other Ambulatory Visit: Payer: Self-pay

## 2022-05-22 ENCOUNTER — Inpatient Hospital Stay (HOSPITAL_BASED_OUTPATIENT_CLINIC_OR_DEPARTMENT_OTHER): Payer: Medicare Other | Admitting: Hematology and Oncology

## 2022-05-22 DIAGNOSIS — Z9012 Acquired absence of left breast and nipple: Secondary | ICD-10-CM | POA: Diagnosis not present

## 2022-05-22 DIAGNOSIS — Z803 Family history of malignant neoplasm of breast: Secondary | ICD-10-CM | POA: Diagnosis not present

## 2022-05-22 DIAGNOSIS — Z171 Estrogen receptor negative status [ER-]: Secondary | ICD-10-CM

## 2022-05-22 DIAGNOSIS — Z801 Family history of malignant neoplasm of trachea, bronchus and lung: Secondary | ICD-10-CM | POA: Diagnosis not present

## 2022-05-22 DIAGNOSIS — C50412 Malignant neoplasm of upper-outer quadrant of left female breast: Secondary | ICD-10-CM | POA: Diagnosis not present

## 2022-05-22 DIAGNOSIS — K5900 Constipation, unspecified: Secondary | ICD-10-CM | POA: Diagnosis not present

## 2022-05-22 DIAGNOSIS — E860 Dehydration: Secondary | ICD-10-CM | POA: Diagnosis not present

## 2022-05-22 DIAGNOSIS — Z95828 Presence of other vascular implants and grafts: Secondary | ICD-10-CM

## 2022-05-22 DIAGNOSIS — Z5112 Encounter for antineoplastic immunotherapy: Secondary | ICD-10-CM | POA: Diagnosis not present

## 2022-05-22 LAB — COMPREHENSIVE METABOLIC PANEL
ALT: 55 U/L — ABNORMAL HIGH (ref 0–44)
AST: 58 U/L — ABNORMAL HIGH (ref 15–41)
Albumin: 3.7 g/dL (ref 3.5–5.0)
Alkaline Phosphatase: 98 U/L (ref 38–126)
Anion gap: 6 (ref 5–15)
BUN: 10 mg/dL (ref 8–23)
CO2: 26 mmol/L (ref 22–32)
Calcium: 9 mg/dL (ref 8.9–10.3)
Chloride: 104 mmol/L (ref 98–111)
Creatinine, Ser: 0.77 mg/dL (ref 0.44–1.00)
GFR, Estimated: >60 mL/min (ref >60)
Glucose, Bld: 94 mg/dL (ref 70–99)
Potassium: 3.4 mmol/L — ABNORMAL LOW (ref 3.5–5.1)
Sodium: 136 mmol/L (ref 135–145)
Total Bilirubin: 0.4 mg/dL (ref 0.3–1.2)
Total Protein: 6.2 g/dL — ABNORMAL LOW (ref 6.5–8.1)

## 2022-05-22 LAB — CBC WITH DIFFERENTIAL/PLATELET
Abs Immature Granulocytes: 0.22 10*3/uL — ABNORMAL HIGH (ref 0.00–0.07)
Basophils Absolute: 0.1 10*3/uL (ref 0.0–0.1)
Basophils Relative: 1 %
Eosinophils Absolute: 0 10*3/uL (ref 0.0–0.5)
Eosinophils Relative: 0 %
HCT: 30.9 % — ABNORMAL LOW (ref 36.0–46.0)
Hemoglobin: 10.4 g/dL — ABNORMAL LOW (ref 12.0–15.0)
Immature Granulocytes: 2 %
Lymphocytes Relative: 10 %
Lymphs Abs: 1.4 10*3/uL (ref 0.7–4.0)
MCH: 28.5 pg (ref 26.0–34.0)
MCHC: 33.7 g/dL (ref 30.0–36.0)
MCV: 84.7 fL (ref 80.0–100.0)
Monocytes Absolute: 0.8 10*3/uL (ref 0.1–1.0)
Monocytes Relative: 6 %
Neutro Abs: 10.5 10*3/uL — ABNORMAL HIGH (ref 1.7–7.7)
Neutrophils Relative %: 81 %
Platelets: 333 10*3/uL (ref 150–400)
RBC: 3.65 MIL/uL — ABNORMAL LOW (ref 3.87–5.11)
RDW: 13.4 % (ref 11.5–15.5)
WBC: 13 10*3/uL — ABNORMAL HIGH (ref 4.0–10.5)
nRBC: 0 % (ref 0.0–0.2)

## 2022-05-22 MED ORDER — HEPARIN SOD (PORK) LOCK FLUSH 100 UNIT/ML IV SOLN
500.0000 [IU] | Freq: Once | INTRAVENOUS | Status: AC
  Administered 2022-05-22: 500 [IU]

## 2022-05-22 MED ORDER — FLUCONAZOLE 100 MG PO TABS: 100.0000 mg | ORAL_TABLET | Freq: Every day | ORAL | 0 refills | Status: DC

## 2022-05-22 MED ORDER — MAGIC MOUTHWASH W/LIDOCAINE: 5.0000 mL | Freq: Four times a day (QID) | ORAL | 1 refills | Status: DC | PRN

## 2022-05-22 MED ORDER — BISACODYL 5 MG PO TBEC: 5.0000 mg | DELAYED_RELEASE_TABLET | Freq: Every day | ORAL | 0 refills | Status: DC | PRN

## 2022-05-22 MED ORDER — SODIUM CHLORIDE 0.9% FLUSH
10.0000 mL | Freq: Once | INTRAVENOUS | Status: AC
  Administered 2022-05-22: 10 mL

## 2022-05-22 NOTE — Progress Notes (Signed)
Patient Care Team: Nicholas Lose, MD as PCP - General (Hematology and Oncology) Cameron Sprang, MD as Consulting Physician (Neurology) Mauro Kaufmann, RN as Oncology Nurse Navigator Rockwell Germany, RN as Oncology Nurse Navigator Coralie Keens, MD as Consulting Physician (General Surgery) Nicholas Lose, MD as Consulting Physician (Hematology and Oncology) Eppie Gibson, MD as Attending Physician (Radiation Oncology)  DIAGNOSIS:  Encounter Diagnosis  Name Primary?   Malignant neoplasm of upper-outer quadrant of left breast in female, estrogen receptor negative ([place])     SUMMARY OF ONCOLOGIC HISTORY: Oncology History  Malignant neoplasm of upper-outer quadrant of left breast in female, estrogen receptor negative ([place])  02/10/2022 Initial Diagnosis   Palpable left breast masses and calcifications: 1.8 cm at 1:00 and 1.7 cm at 9:00, calcifications not biopsy.  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 Report date is 03/05/2022.  The CustomNext-Cancer+RNAinsight panel offered by [facility] includes sequencing and rearrangement analysis for the following 48 genes:  APC, ATM, AXIN2, BARD1, BMPR1A, BRCA1, BRCA2, BRIP1, CDH1, CDK4, CDKN2A, CHEK2, CTNNA1, DICER1, EGFR, EPCAM, GREM1, HOXB13, KIT, MEN1, MLH1, MSH2, MSH3, MSH6, MUTYH, NBN, NF1, NTHL1, PALB2, PDGFRA, PMS2, POLD1, POLE, PTEN, RAD50, RAD51C, RAD51D, SDHA, SDHB, SDHC, SDHD, SMAD4, SMARCA4, STK11, TP53, TSC1, TSC2, and VHL.  RNA data is routinely analyzed for use in variant interpretation for all genes.   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, LCIS, 5/5 lymph nodes positive with extranodal extension, margins negative ER 0%, PR 0%, HER2 3+, Ki-67 20%   04/27/2022 Cancer Staging   Staging form: Breast, AJCC 8th Edition - Pathologic: Stage IIIA (pT2, pN2, cM0, G2, ER-, PR-, HER2+) - Signed by Nicholas Lose, MD on 04/27/2022 Histologic grading system: 3 grade system   05/05/2022 -  Chemotherapy   Patient is on Treatment Plan : BREAST DOCEtaxel + Trastuzumab + Pertuzumab (THP) q21d x 8 cycles / Trastuzumab + Pertuzumab q21d x 4 cycles       CHIEF COMPLIANT: Follow-up metastatic breast cancer  INTERVAL HISTORY: Allison Pierce is a 76 y.o female here because of f/u hospitalization from a reaction from treatment reaction. She states that she is still not able to go to the restroom having constipation. She does have assistance when she goes to the bathroom. It is causing her to feel like she is about to past out. She also thought she was getting a yeast infection. She is eating a little better now.  ALLERGIES:  is allergic to gadolinium derivatives, atorvastatin calcium, azelaic acid, fluoxetine, oxybutynin, prozac [fluoxetine hcl], ritalin [methylphenidate], rosuvastatin, and lyrica [pregabalin].  MEDICATIONS:  Current Outpatient Medications  Medication Sig Dispense Refill   bisacodyl (DULCOLAX) 5 MG EC tablet Take 1 tablet (5 mg total) by mouth daily as needed for moderate constipation. 30 tablet 0   fluconazole (DIFLUCAN) 100 MG tablet Take 1 tablet (100 mg total) by mouth daily. 15 tablet 0   B Complex-C (B COMPLEX-VITAMIN C) CAPS Take 1 capsule by mouth daily.     buPROPion (WELLBUTRIN XL) 300 MG 24 hr tablet Take 300 mg  by mouth every morning.     calcium carbonate (TUMS - DOSED IN MG ELEMENTAL CALCIUM) 500 MG chewable tablet Chew 1 tablet by mouth daily as needed for indigestion or heartburn.     cholecalciferol (VITAMIN D3) 25 MCG (1000 UNIT) tablet Take  1,000 Units by mouth daily.     clonazePAM (KLONOPIN) 1 MG tablet Take 1 mg by mouth at bedtime.     dexamethasone (DECADRON) 4 MG tablet Take 1 tablet (4 mg total) by mouth daily. Take 1 tablet day before chemo and 1 tablet day after chemo with food 20 tablet 0   fluticasone (FLONASE) 50 MCG/ACT nasal spray Place 1 spray into both nostrils daily as needed for allergies.     lidocaine-prilocaine (EMLA) cream Apply to affected area once 30 g 3   losartan (COZAAR) 25 MG tablet Take 50 mg by mouth daily.     magic mouthwash w/lidocaine SOLN Take 5 mLs by mouth 4 (four) times daily as needed for mouth pain. 240 mL 1   meloxicam (MOBIC) 15 MG tablet Take 15 mg by mouth daily.     ondansetron (ZOFRAN) 8 MG tablet Take 1 tablet (8 mg total) by mouth 2 (two) times daily as needed for refractory nausea / vomiting. (Patient not taking: Reported on 05/12/2022) 30 tablet 1   Potassium 95 MG TABS Take 95 mg by mouth daily.     prochlorperazine (COMPAZINE) 10 MG tablet Take 1 tablet (10 mg total) by mouth every 6 (six) hours as needed (Nausea or vomiting). (Patient not taking: Reported on 05/12/2022) 30 tablet 1   traZODone (DESYREL) 150 MG tablet Take 225 mg by mouth at bedtime.     venlafaxine XR (EFFEXOR-XR) 75 MG 24 hr capsule Take 75 mg by mouth daily with breakfast.     vitamin B-12 (CYANOCOBALAMIN) 500 MCG tablet Take 500 mcg by mouth daily.     No current facility-administered medications for this visit.    PHYSICAL EXAMINATION: ECOG PERFORMANCE STATUS: 1 - Symptomatic but completely ambulatory  Vitals:   05/22/22 1003  BP: (!) 157/83  Pulse: 84  Resp: 18  Temp: 98.1 F (36.7 C)  SpO2: 99%   Filed Weights   05/22/22 1003  Weight: 156 lb 9.6 oz (71 kg)     LABORATORY DATA:  I have reviewed the data as listed    Latest Ref Rng & Units 05/22/2022    9:56 AM 05/17/2022    5:11 AM 05/16/2022    5:07 AM  CMP  Glucose 70 - 99 mg/dL 94  98  113   BUN 8 - 23 mg/dL '10  8  8   ' Creatinine 0.44  - 1.00 mg/dL 0.77  0.91  0.89   Sodium 135 - 145 mmol/L 136  140  139   Potassium 3.5 - 5.1 mmol/L 3.4  3.6  3.2   Chloride 98 - 111 mmol/L 104  114  111   CO2 22 - 32 mmol/L '26  20  22   ' Calcium 8.9 - 10.3 mg/dL 9.0  8.0  8.0   Total Protein 6.5 - 8.1 g/dL 6.2     Total Bilirubin 0.3 - 1.2 mg/dL 0.4     Alkaline Phos 38 - 126 U/L 98     AST 15 - 41 U/L 58     ALT 0 - 44 U/L 55       Lab Results  Component Value Date   WBC 13.0 (H) 05/22/2022   HGB 10.4 (L)  05/22/2022   HCT 30.9 (L) 05/22/2022   MCV 84.7 05/22/2022   PLT 333 05/22/2022   NEUTROABS 10.5 (H) 05/22/2022    ASSESSMENT & PLAN:  Malignant neoplasm of upper-outer quadrant of left breast in female, estrogen receptor negative ([place]) 04/07/2022:Left mastectomy: 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  T4 vertebral body Bone scan 04/25/2022: Abnormal uptake in the sternum, right parietal region of calvarium, right posterior seventh rib, T5, left sternoclavicular joint   Treatment: Palliative chemotherapy with Taxotere [name] and Perjeta every 3 weeks x1 cycle given 05/05/2022 (discontinued for severe toxicities and hospitalization)   ---------------------------------------------------------------------------------------------------------------------------------------------- Chemotoxicities: Hospitalization 05/11/2022-05/18/2022: Convulsive syncope, chemo induced neutropenia, oral mucositis, chronic CHF, severe diarrhea with dehydration  Treatment plan: Discontinue Taxotere and Perjeta Treated with single agent [name] every 3 weeks. If she recovers her strength and energy, we might consider adding back on Perjeta.  Severe constipation: I instructed them to discontinue Lomotil and  Imodium.  Encouraged her to use Dulcolax as needed for constipation.  Patient had profound toxicities to chemotherapy and therefore we will discontinue further chemo at this point. We will follow her with scans and monitor her disease.    No orders of the defined types were placed in this encounter.  The patient has a good understanding of the overall plan. she agrees with it. she will call with any problems that may develop before the next visit here. Total time spent: 30 mins including face to face time and time spent for planning, charting and co-ordination of care   Harriette Ohara, MD 05/22/22    I Gardiner Coins am scribing for Dr. Lindi Adie  I have reviewed the above documentation for accuracy and completeness, and I agree with the above.

## 2022-05-22 NOTE — Assessment & Plan Note (Addendum)
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 T4 vertebral body Bone scan 04/25/2022: Abnormal uptake in the sternum, right parietal region of calvarium, right posterior seventh rib, T5, left sternoclavicular joint  Treatment: Palliative chemotherapy with Taxotere [name] and Perjeta every 3 weeks x1 cycle given 05/05/2022 (discontinued for severe toxicities and hospitalization)   ---------------------------------------------------------------------------------------------------------------------------------------------- Chemotoxicities: Hospitalization 05/11/2022-05/18/2022: Convulsive syncope, chemo induced neutropenia, oral mucositis, chronic CHF, severe diarrhea with dehydration  Treatment plan: Discontinue Taxotere and Perjeta Treated with single agent [name] every 3 weeks.  Patient had profound toxicities to chemotherapy and therefore we will discontinue further chemo at this point. We will follow her with scans and monitor her disease.

## 2022-05-23 ENCOUNTER — Encounter: Payer: Self-pay | Admitting: *Deleted

## 2022-05-24 DIAGNOSIS — C7951 Secondary malignant neoplasm of bone: Secondary | ICD-10-CM | POA: Diagnosis not present

## 2022-05-24 DIAGNOSIS — T451X5D Adverse effect of antineoplastic and immunosuppressive drugs, subsequent encounter: Secondary | ICD-10-CM | POA: Diagnosis not present

## 2022-05-24 DIAGNOSIS — C50912 Malignant neoplasm of unspecified site of left female breast: Secondary | ICD-10-CM | POA: Diagnosis not present

## 2022-05-24 DIAGNOSIS — B029 Zoster without complications: Secondary | ICD-10-CM | POA: Diagnosis not present

## 2022-05-24 DIAGNOSIS — D701 Agranulocytosis secondary to cancer chemotherapy: Secondary | ICD-10-CM | POA: Diagnosis not present

## 2022-05-24 DIAGNOSIS — I110 Hypertensive heart disease with heart failure: Secondary | ICD-10-CM | POA: Diagnosis not present

## 2022-05-26 ENCOUNTER — Inpatient Hospital Stay: Payer: Medicare Other | Admitting: Hematology and Oncology

## 2022-05-26 ENCOUNTER — Inpatient Hospital Stay: Payer: Medicare Other

## 2022-05-26 ENCOUNTER — Other Ambulatory Visit: Payer: Self-pay

## 2022-05-26 VITALS — BP 167/91 | HR 83 | Temp 97.9°F | Resp 16

## 2022-05-26 DIAGNOSIS — C50412 Malignant neoplasm of upper-outer quadrant of left female breast: Secondary | ICD-10-CM | POA: Diagnosis not present

## 2022-05-26 DIAGNOSIS — Z171 Estrogen receptor negative status [ER-]: Secondary | ICD-10-CM

## 2022-05-26 DIAGNOSIS — Z5112 Encounter for antineoplastic immunotherapy: Secondary | ICD-10-CM | POA: Diagnosis not present

## 2022-05-26 DIAGNOSIS — Z95828 Presence of other vascular implants and grafts: Secondary | ICD-10-CM

## 2022-05-26 DIAGNOSIS — K5900 Constipation, unspecified: Secondary | ICD-10-CM | POA: Diagnosis not present

## 2022-05-26 DIAGNOSIS — E860 Dehydration: Secondary | ICD-10-CM | POA: Diagnosis not present

## 2022-05-26 DIAGNOSIS — Z9012 Acquired absence of left breast and nipple: Secondary | ICD-10-CM | POA: Diagnosis not present

## 2022-05-26 LAB — CBC WITH DIFFERENTIAL/PLATELET
Abs Immature Granulocytes: 0.03 10*3/uL (ref 0.00–0.07)
Basophils Absolute: 0.1 10*3/uL (ref 0.0–0.1)
Basophils Relative: 2 %
Eosinophils Absolute: 0.1 10*3/uL (ref 0.0–0.5)
Eosinophils Relative: 1 %
HCT: 31 % — ABNORMAL LOW (ref 36.0–46.0)
Hemoglobin: 10.4 g/dL — ABNORMAL LOW (ref 12.0–15.0)
Immature Granulocytes: 0 %
Lymphocytes Relative: 16 %
Lymphs Abs: 1.1 10*3/uL (ref 0.7–4.0)
MCH: 28.6 pg (ref 26.0–34.0)
MCHC: 33.5 g/dL (ref 30.0–36.0)
MCV: 85.2 fL (ref 80.0–100.0)
Monocytes Absolute: 0.6 10*3/uL (ref 0.1–1.0)
Monocytes Relative: 8 %
Neutro Abs: 5.1 10*3/uL (ref 1.7–7.7)
Neutrophils Relative %: 73 %
Platelets: 448 10*3/uL — ABNORMAL HIGH (ref 150–400)
RBC: 3.64 MIL/uL — ABNORMAL LOW (ref 3.87–5.11)
RDW: 14.3 % (ref 11.5–15.5)
WBC: 7 10*3/uL (ref 4.0–10.5)
nRBC: 0 % (ref 0.0–0.2)

## 2022-05-26 LAB — COMPREHENSIVE METABOLIC PANEL
ALT: 32 U/L (ref 0–44)
AST: 26 U/L (ref 15–41)
Albumin: 3.6 g/dL (ref 3.5–5.0)
Alkaline Phosphatase: 91 U/L (ref 38–126)
Anion gap: 4 — ABNORMAL LOW (ref 5–15)
BUN: 14 mg/dL (ref 8–23)
CO2: 28 mmol/L (ref 22–32)
Calcium: 8.9 mg/dL (ref 8.9–10.3)
Chloride: 105 mmol/L (ref 98–111)
Creatinine, Ser: 0.67 mg/dL (ref 0.44–1.00)
GFR, Estimated: >60 mL/min (ref >60)
Glucose, Bld: 99 mg/dL (ref 70–99)
Potassium: 4 mmol/L (ref 3.5–5.1)
Sodium: 137 mmol/L (ref 135–145)
Total Bilirubin: 0.4 mg/dL (ref 0.3–1.2)
Total Protein: 5.9 g/dL — ABNORMAL LOW (ref 6.5–8.1)

## 2022-05-26 MED ORDER — DIPHENHYDRAMINE HCL 25 MG PO CAPS
25.0000 mg | ORAL_CAPSULE | Freq: Once | ORAL | Status: AC
  Administered 2022-05-26: 25 mg via ORAL
  Filled 2022-05-26: qty 1

## 2022-05-26 MED ORDER — SODIUM CHLORIDE 0.9% FLUSH
10.0000 mL | Freq: Once | INTRAVENOUS | Status: AC
  Administered 2022-05-26: 10 mL

## 2022-05-26 MED ORDER — SODIUM CHLORIDE 0.9% FLUSH
10.0000 mL | INTRAVENOUS | Status: DC | PRN
  Administered 2022-05-26: 10 mL

## 2022-05-26 MED ORDER — HEPARIN SOD (PORK) LOCK FLUSH 100 UNIT/ML IV SOLN
500.0000 [IU] | Freq: Once | INTRAVENOUS | Status: AC | PRN
  Administered 2022-05-26: 500 [IU]

## 2022-05-26 MED ORDER — SODIUM CHLORIDE 0.9 % IV SOLN: Freq: Once | INTRAVENOUS | Status: AC

## 2022-05-26 MED ORDER — ALTEPLASE 2 MG IJ SOLR
2.0000 mg | Freq: Once | INTRAMUSCULAR | Status: AC | PRN
  Administered 2022-05-26: 2 mg
  Filled 2022-05-26: qty 2

## 2022-05-26 MED ORDER — ACETAMINOPHEN 325 MG PO TABS
650.0000 mg | ORAL_TABLET | Freq: Once | ORAL | Status: AC
  Administered 2022-05-26: 650 mg via ORAL
  Filled 2022-05-26: qty 2

## 2022-05-26 MED ORDER — TRASTUZUMAB-DKST CHEMO 150 MG IV SOLR
6.0000 mg/kg | Freq: Once | INTRAVENOUS | Status: AC
  Administered 2022-05-26: 420 mg via INTRAVENOUS
  Filled 2022-05-26: qty 20

## 2022-05-26 NOTE — Patient Instructions (Signed)
[facility] Discharge Instructions for Patients Receiving Chemotherapy  Today you received the following chemotherapy agents trastuzumab   To help prevent nausea and vomiting after your treatment, we encourage you to take your nausea medication as directed.     If you develop nausea and vomiting that is not controlled by your nausea medication, call the clinic.   BELOW ARE SYMPTOMS THAT SHOULD BE REPORTED IMMEDIATELY: *FEVER GREATER THAN 100.5 F *CHILLS WITH OR WITHOUT FEVER NAUSEA AND VOMITING THAT IS NOT CONTROLLED WITH YOUR NAUSEA MEDICATION *UNUSUAL SHORTNESS OF BREATH *UNUSUAL BRUISING OR BLEEDING TENDERNESS IN MOUTH AND THROAT WITH OR WITHOUT PRESENCE OF ULCERS *URINARY PROBLEMS *BOWEL PROBLEMS UNUSUAL RASH Items with * indicate a potential emergency and should be followed up as soon as possible.  Feel free to call the clinic you have any questions or concerns. The clinic phone number is [phone].  Trastuzumab Injection What is this medication? TRASTUZUMAB (tras TOO zoo mab) treats breast cancer and stomach cancer. It works by blocking a protein that causes cancer cells to grow and multiply. This helps to slow or stop the spread of cancer cells. This medicine may be used for other purposes; ask your health care provider or pharmacist if you have questions. COMMON BRAND NAME(S): [name], Janae Bridgeman, Ontruzant, Trazimera What should I tell my care team before I take this medication? They need to know if you have any of these conditions: Heart failure Lung disease An unusual or allergic reaction to trastuzumab, other medications, foods, dyes, or preservatives Pregnant or trying to get pregnant Breast-feeding How should I use this medication? This medication is injected into a vein. It is given by your care team in a hospital or clinic setting. Talk to your care team about the use of this medication in children. It is not approved for use in  children. Overdosage: If you think you have taken too much of this medicine contact a poison control center or emergency room at once. NOTE: This medicine is only for you. Do not share this medicine with others. What if I miss a dose? Keep appointments for follow-up doses. It is important not to miss your dose. Call your care team if you are unable to keep an appointment. What may interact with this medication? Certain types of chemotherapy, such as daunorubicin, doxorubicin, epirubicin, idarubicin This list may not describe all possible interactions. Give your health care provider a list of all the medicines, herbs, non-prescription drugs, or dietary supplements you use. Also tell them if you smoke, drink alcohol, or use illegal drugs. Some items may interact with your medicine. What should I watch for while using this medication? Your condition will be monitored carefully while you are receiving this medication. This medication may make you feel generally unwell. This is not uncommon, as chemotherapy affects healthy cells as well as cancer cells. Report any side effects. Continue your course of treatment even though you feel ill unless your care team tells you to stop. This medication may increase your risk of getting an infection. Call your care team for advice if you get a fever, chills, sore throat, or other symptoms of a cold or flu. Do not treat yourself. Try to avoid being around people who are sick. Avoid taking medications that contain aspirin, acetaminophen, ibuprofen, naproxen, or ketoprofen unless instructed by your care team. These medications can hide a fever. Talk to your care team if you may be pregnant. Serious birth defects can occur if you take this  medication during pregnancy and for 7 months after the last dose. You will need a negative pregnancy test before starting this medication. Contraception is recommended while taking this medication and for 7 months after the last dose.  Your care team can help you find the option that works for you. Do not breastfeed while taking this medication and for 7 months after stopping treatment. What side effects may I notice from receiving this medication? Side effects that you should report to your care team as soon as possible: Allergic reactions or angioedema--skin rash, itching or hives, swelling of the face, eyes, lips, tongue, arms, or legs, trouble swallowing or breathing Dry cough, shortness of breath or trouble breathing Heart failure--shortness of breath, swelling of the ankles, feet, or hands, sudden weight gain, unusual weakness or fatigue Infection--fever, chills, cough, or sore throat Infusion reactions--chest pain, shortness of breath or trouble breathing, feeling faint or lightheaded Side effects that usually do not require medical attention (report to your care team if they continue or are bothersome): Diarrhea Dizziness Headache Nausea Trouble sleeping Vomiting This list may not describe all possible side effects. Call your doctor for medical advice about side effects. You may report side effects to FDA at 1-800-FDA-1088. Where should I keep my medication? This medication is given in a hospital or clinic. It will not be stored at home. NOTE: This sheet is a summary. It may not cover all possible information. If you have questions about this medicine, talk to your doctor, pharmacist, or health care provider.  2023 Elsevier/Gold Standard (2022-01-31 00:00:00)

## 2022-05-29 ENCOUNTER — Inpatient Hospital Stay: Payer: Medicare Other

## 2022-05-31 DIAGNOSIS — B029 Zoster without complications: Secondary | ICD-10-CM | POA: Diagnosis not present

## 2022-05-31 DIAGNOSIS — C50912 Malignant neoplasm of unspecified site of left female breast: Secondary | ICD-10-CM | POA: Diagnosis not present

## 2022-05-31 DIAGNOSIS — I110 Hypertensive heart disease with heart failure: Secondary | ICD-10-CM | POA: Diagnosis not present

## 2022-05-31 DIAGNOSIS — D701 Agranulocytosis secondary to cancer chemotherapy: Secondary | ICD-10-CM | POA: Diagnosis not present

## 2022-05-31 DIAGNOSIS — T451X5D Adverse effect of antineoplastic and immunosuppressive drugs, subsequent encounter: Secondary | ICD-10-CM | POA: Diagnosis not present

## 2022-05-31 DIAGNOSIS — C7951 Secondary malignant neoplasm of bone: Secondary | ICD-10-CM | POA: Diagnosis not present

## 2022-06-05 ENCOUNTER — Emergency Department (HOSPITAL_COMMUNITY): Payer: Medicare Other

## 2022-06-05 ENCOUNTER — Other Ambulatory Visit: Payer: Self-pay

## 2022-06-05 ENCOUNTER — Emergency Department (HOSPITAL_COMMUNITY)
Admission: EM | Admit: 2022-06-05 | Discharge: 2022-06-05 | Disposition: A | Discharge status: Home / Self Care | Payer: Medicare Other | Attending: Emergency Medicine | Admitting: Emergency Medicine

## 2022-06-05 ENCOUNTER — Encounter (HOSPITAL_COMMUNITY): Payer: Self-pay

## 2022-06-05 DIAGNOSIS — Z853 Personal history of malignant neoplasm of breast: Secondary | ICD-10-CM | POA: Diagnosis not present

## 2022-06-05 DIAGNOSIS — R0902 Hypoxemia: Secondary | ICD-10-CM | POA: Diagnosis not present

## 2022-06-05 DIAGNOSIS — Z79899 Other long term (current) drug therapy: Secondary | ICD-10-CM | POA: Diagnosis not present

## 2022-06-05 DIAGNOSIS — E860 Dehydration: Secondary | ICD-10-CM | POA: Insufficient documentation

## 2022-06-05 DIAGNOSIS — R55 Syncope and collapse: Secondary | ICD-10-CM | POA: Diagnosis not present

## 2022-06-05 DIAGNOSIS — I10 Essential (primary) hypertension: Secondary | ICD-10-CM | POA: Insufficient documentation

## 2022-06-05 DIAGNOSIS — R42 Dizziness and giddiness: Secondary | ICD-10-CM | POA: Diagnosis not present

## 2022-06-05 DIAGNOSIS — R531 Weakness: Secondary | ICD-10-CM | POA: Diagnosis not present

## 2022-06-05 LAB — CBG MONITORING, ED: Glucose-Capillary: 121 mg/dL — ABNORMAL HIGH (ref 70–99)

## 2022-06-05 LAB — URINALYSIS, ROUTINE W REFLEX MICROSCOPIC
Bilirubin Urine: NEGATIVE
Glucose, UA: NEGATIVE mg/dL
Hgb urine dipstick: NEGATIVE
Ketones, ur: NEGATIVE mg/dL
Leukocytes,Ua: NEGATIVE
Nitrite: NEGATIVE
Protein, ur: NEGATIVE mg/dL
Specific Gravity, Urine: 1.004 — ABNORMAL LOW (ref 1.005–1.030)
pH: 7 (ref 5.0–8.0)

## 2022-06-05 LAB — CBC WITH DIFFERENTIAL/PLATELET
Abs Immature Granulocytes: 0.02 10*3/uL (ref 0.00–0.07)
Basophils Absolute: 0.1 10*3/uL (ref 0.0–0.1)
Basophils Relative: 1 %
Eosinophils Absolute: 0.4 10*3/uL (ref 0.0–0.5)
Eosinophils Relative: 7 %
HCT: 33.4 % — ABNORMAL LOW (ref 36.0–46.0)
Hemoglobin: 10.7 g/dL — ABNORMAL LOW (ref 12.0–15.0)
Immature Granulocytes: 0 %
Lymphocytes Relative: 15 %
Lymphs Abs: 0.9 10*3/uL (ref 0.7–4.0)
MCH: 28.6 pg (ref 26.0–34.0)
MCHC: 32 g/dL (ref 30.0–36.0)
MCV: 89.3 fL (ref 80.0–100.0)
Monocytes Absolute: 0.5 10*3/uL (ref 0.1–1.0)
Monocytes Relative: 9 %
Neutro Abs: 3.9 10*3/uL (ref 1.7–7.7)
Neutrophils Relative %: 68 %
Platelets: 316 10*3/uL (ref 150–400)
RBC: 3.74 MIL/uL — ABNORMAL LOW (ref 3.87–5.11)
RDW: 14.8 % (ref 11.5–15.5)
WBC: 5.8 10*3/uL (ref 4.0–10.5)
nRBC: 0 % (ref 0.0–0.2)

## 2022-06-05 LAB — COMPREHENSIVE METABOLIC PANEL
ALT: 25 U/L (ref 0–44)
AST: 25 U/L (ref 15–41)
Albumin: 3.5 g/dL (ref 3.5–5.0)
Alkaline Phosphatase: 76 U/L (ref 38–126)
Anion gap: 6 (ref 5–15)
BUN: 11 mg/dL (ref 8–23)
CO2: 24 mmol/L (ref 22–32)
Calcium: 8.7 mg/dL — ABNORMAL LOW (ref 8.9–10.3)
Chloride: 107 mmol/L (ref 98–111)
Creatinine, Ser: 0.77 mg/dL (ref 0.44–1.00)
GFR, Estimated: >60 mL/min (ref >60)
Glucose, Bld: 120 mg/dL — ABNORMAL HIGH (ref 70–99)
Potassium: 3.6 mmol/L (ref 3.5–5.1)
Sodium: 137 mmol/L (ref 135–145)
Total Bilirubin: 0.5 mg/dL (ref 0.3–1.2)
Total Protein: 6.1 g/dL — ABNORMAL LOW (ref 6.5–8.1)

## 2022-06-05 MED ORDER — SODIUM CHLORIDE 0.9% FLUSH: 10.0000 mL | INTRAVENOUS | Status: DC | PRN

## 2022-06-05 MED ORDER — ALTEPLASE 2 MG IJ SOLR
2.0000 mg | Freq: Once | INTRAMUSCULAR | Status: AC
  Administered 2022-06-05: 2 mg
  Filled 2022-06-05: qty 2

## 2022-06-05 MED ORDER — SODIUM CHLORIDE 0.9 % IV BOLUS
1000.0000 mL | Freq: Once | INTRAVENOUS | Status: AC
  Administered 2022-06-05: 1000 mL via INTRAVENOUS

## 2022-06-05 MED ORDER — SODIUM CHLORIDE 0.9 % IV SOLN: INTRAVENOUS | Status: DC

## 2022-06-05 MED ORDER — CHLORHEXIDINE GLUCONATE CLOTH 2 % EX PADS: 6.0000 | MEDICATED_PAD | Freq: Every day | CUTANEOUS | Status: DC

## 2022-06-05 MED ORDER — HEPARIN SOD (PORK) LOCK FLUSH 100 UNIT/ML IV SOLN
500.0000 [IU] | INTRAVENOUS | Status: AC | PRN
Start: 2022-06-05 — End: 2022-06-05
  Administered 2022-06-05: 500 [IU]

## 2022-06-05 NOTE — ED Triage Notes (Signed)
Pt BIB EMS from home. Pt had syncopal episode while on the toilet this morning. Denies any fall or injury. Pt is actively receiving chemo. Last chemo was 2 weeks ago.

## 2022-06-05 NOTE — ED Provider Notes (Signed)
[facility] DEPT Provider Note   CSN: [id number] Arrival date & time: 06/05/22  0948     History  Chief Complaint  Patient presents with   Loss of Consciousness    Allison Pierce is a 76 y.o. female.  Pt is a 76 yo female with a pmhx significant for breast cancer, HLD, HTN, depression, and GERD. PPT had a mastectomy in July.  Her first chemo was on 8/4. Her oncologist is Dr. Lindi Adie.  She was admitted from 8/10-17 for colitis and syncope.  She had an infusion of Trastuzumab on 8/25.  She had been doing pretty well until today.  Pt said she's been eating and drinking well.  Husband said she's not.  She had pretzels last night for dinner.  She had a half a biscuit the rest of the day.  Pt was using the bathroom this am and passed out.  Pt was pushing and had a large bm.  Pt said she did not hurt herself.  She feels weak "like water."         Home Medications Prior to Admission medications   Medication Sig Start Date End Date Taking? Authorizing Provider  acetaminophen (TYLENOL) 500 MG tablet Take 500 mg by mouth at bedtime.   Yes [provider]  B Complex-C (B COMPLEX-VITAMIN C) CAPS Take 1 capsule by mouth daily.   Yes [provider]  buPROPion (WELLBUTRIN XL) 300 MG 24 hr tablet Take 300 mg by mouth every morning.   Yes [provider]  calcium carbonate (TUMS - DOSED IN MG ELEMENTAL CALCIUM) 500 MG chewable tablet Chew 2 tablets by mouth daily as needed for indigestion or heartburn.   Yes [provider]  cholecalciferol (VITAMIN D3) 25 MCG (1000 UNIT) tablet Take 1,000 Units by mouth daily.   Yes [provider]  clonazePAM (KLONOPIN) 1 MG tablet Take 1 mg by mouth at bedtime.   Yes [provider]  Cyanocobalamin (VITAMIN B-12) 5000 MCG TBDP Take 5,000 Units by mouth daily.   Yes [provider]  diphenhydrAMINE (BENADRYL) 25 MG tablet Take 25 mg by mouth in the morning and at  bedtime.   Yes [provider]  fluticasone (FLONASE) 50 MCG/ACT nasal spray Place 1 spray into both nostrils daily as needed for allergies.   Yes [provider]  lidocaine-prilocaine (EMLA) cream Apply to affected area once Patient taking differently: Apply 1 Application topically See admin instructions. 1 application to port before chemo 04/27/22  Yes Nicholas Lose, MD  losartan (COZAAR) 25 MG tablet Take 50 mg by mouth daily. 01/13/22  Yes [provider]  meloxicam (MOBIC) 15 MG tablet Take 15 mg by mouth daily.   Yes [provider]  Potassium 99 MG TABS Take 99 mg by mouth at bedtime.   Yes [provider]  traZODone (DESYREL) 150 MG tablet Take 225 mg by mouth at bedtime.   Yes [provider]  venlafaxine XR (EFFEXOR-XR) 75 MG 24 hr capsule Take 75 mg by mouth daily with breakfast.   Yes [provider]  dexamethasone (DECADRON) 4 MG tablet Take 1 tablet (4 mg total) by mouth daily. Take 1 tablet day before chemo and 1 tablet day after chemo with food Patient not taking: Reported on 06/05/2022 04/27/22   Nicholas Lose, MD  fluconazole (DIFLUCAN) 100 MG tablet Take 1 tablet (100 mg total) by mouth daily. Patient not taking: Reported on 06/05/2022 05/22/22   Nicholas Lose, MD  letrozole (  FEMARA) 2.5 MG tablet Take 2.5 mg by mouth daily. Patient not taking: Reported on 06/05/2022    [provider]  magic mouthwash w/lidocaine SOLN Take 5 mLs by mouth 4 (four) times daily as needed for mouth pain. Patient not taking: Reported on 06/05/2022 05/22/22   Nicholas Lose, MD  ondansetron (ZOFRAN) 8 MG tablet Take 1 tablet (8 mg total) by mouth 2 (two) times daily as needed for refractory nausea / vomiting. Patient not taking: Reported on 05/12/2022 04/27/22   Nicholas Lose, MD  prochlorperazine (COMPAZINE) 10 MG tablet Take 1 tablet (10 mg total) by mouth every 6 (six) hours as needed (Nausea or vomiting). Patient not taking: Reported on  05/12/2022 04/27/22   Nicholas Lose, MD      Allergies    Gadolinium derivatives, Atorvastatin calcium, Azelaic acid, Oxybutynin, Prozac [fluoxetine hcl], Ritalin [methylphenidate], Rosuvastatin, and Lyrica [pregabalin]    Review of Systems   Review of Systems  Neurological:  Positive for syncope.  All other systems reviewed and are negative.   Physical Exam Updated Vital Signs BP (!) 190/75   Pulse 88   Temp 97.6 F (36.4 C) (Oral)   Resp 17   SpO2 100%  Physical Exam Vitals and nursing note reviewed.  Constitutional:      Appearance: Normal appearance.  HENT:     Head: Normocephalic and atraumatic.     Right Ear: External ear normal.     Left Ear: External ear normal.     Nose: Nose normal.     Mouth/Throat:     Mouth: Mucous membranes are dry.  Eyes:     Extraocular Movements: Extraocular movements intact.     Conjunctiva/sclera: Conjunctivae normal.     Pupils: Pupils are equal, round, and reactive to light.  Cardiovascular:     Rate and Rhythm: Normal rate and regular rhythm.     Pulses: Normal pulses.     Heart sounds: Normal heart sounds.  Pulmonary:     Effort: Pulmonary effort is normal.     Breath sounds: Normal breath sounds.  Abdominal:     General: Abdomen is flat. Bowel sounds are normal.     Palpations: Abdomen is soft.  Musculoskeletal:        General: Normal range of motion.     Cervical back: Normal range of motion and neck supple.  Skin:    General: Skin is warm.     Capillary Refill: Capillary refill takes less than 2 seconds.  Neurological:     General: No focal deficit present.     Mental Status: She is alert and oriented to person, place, and time.  Psychiatric:        Mood and Affect: Mood normal.        Behavior: Behavior normal.        Thought Content: Thought content normal.        Judgment: Judgment normal.     ED Results / Procedures / Treatments   Labs (all labs ordered are listed, but only abnormal results are  displayed) Labs Reviewed  CBC WITH DIFFERENTIAL/PLATELET - Abnormal; Notable for the following components:      Result Value   RBC 3.74 (*)    Hemoglobin 10.7 (*)    HCT 33.4 (*)    All other components within normal limits  URINALYSIS, ROUTINE W REFLEX MICROSCOPIC - Abnormal; Notable for the following components:   Specific Gravity, Urine 1.004 (*)    All other components within normal limits  COMPREHENSIVE  METABOLIC PANEL - Abnormal; Notable for the following components:   Glucose, Bld 120 (*)    Calcium 8.7 (*)    Total Protein 6.1 (*)    All other components within normal limits  CBG MONITORING, ED - Abnormal; Notable for the following components:   Glucose-Capillary 121 (*)    All other components within normal limits    EKG EKG Interpretation  Date/Time:  Monday June 05 2022 10:03:10 EDT Ventricular Rate:  74 PR Interval:  184 QRS Duration: 94 QT Interval:  412 QTC Calculation: 458 R Axis:   41 Text Interpretation: Sinus rhythm Atrial premature complex Unchanged from Aug 2023 tracing Confirmed by Nanda Quinton [id number]) on 06/05/2022 10:08:04 AM  Radiology DG Chest Port 1 View  Result Date: 06/05/2022 CLINICAL DATA:  Syncopal episode this morning. History of breast carcinoma. EXAM: PORTABLE CHEST 1 VIEW COMPARISON:  05/12/2022. FINDINGS: Cardiac silhouette is normal in size. Normal mediastinal and hilar contours. Right internal jugular Port-A-Cath, tip in the upper superior vena cava, stable. Clear lungs.  No pleural effusion or pneumothorax. Left breast tissue expander and adjacent surgical vascular clips. Skeletal structures are grossly intact. IMPRESSION: No acute cardiopulmonary disease. Electronically Signed   By: Lajean Manes M.D.   On: 06/05/2022 10:50    Procedures Procedures    Medications Ordered in ED Medications  sodium chloride 0.9 % bolus 1,000 mL (0 mLs Intravenous Stopped 06/05/22 1153)    And  0.9 %  sodium chloride infusion ( Intravenous New  Bag/Given 06/05/22 1018)  sodium chloride flush (NS) 0.9 % injection 10-40 mL (has no administration in time range)  Chlorhexidine Gluconate Cloth 2 % PADS 6 each (has no administration in time range)  heparin lock flush 100 unit/mL (has no administration in time range)  alteplase (CATHFLO ACTIVASE) injection 2 mg (2 mg Intracatheter Given 06/05/22 1255)  sodium chloride 0.9 % bolus 1,000 mL (1,000 mLs Intravenous New Bag/Given (Non-Interop) 06/05/22 1309)    ED Course/ Medical Decision Making/ A&P                           Medical Decision Making Amount and/or Complexity of Data Reviewed Labs: ordered. Radiology: ordered. ECG/medicine tests: ordered.  Risk OTC drugs. Prescription drug management.   This patient presents to the ED for concern of syncope, this involves an extensive number of treatment options, and is a complaint that carries with it a high risk of complications and morbidity.  The differential diagnosis includes dehydration, anemia, electrolyte abn   Co morbidities that complicate the patient evaluation  breast cancer, HLD, HTN, depression, and GERD   Additional history obtained:  Additional history obtained from epic chart review External records from outside source obtained and reviewed including husband   Lab Tests:  I Ordered, and personally interpreted labs.  The pertinent results include:  cbc with hgb 10.7 (chronic), cmp nl; ua nl   Imaging Studies ordered:  I ordered imaging studies including cxr  I independently visualized and interpreted imaging which showed  IMPRESSION:  No acute cardiopulmonary disease.   I agree with the radiologist interpretation   Cardiac Monitoring:  The patient was maintained on a cardiac monitor.  I personally viewed and interpreted the cardiac monitored which showed an underlying rhythm of: nsr   Medicines ordered and prescription drug management:  I ordered medication including ivfs  for dehydration   Reevaluation of the patient after these medicines showed that the patient improved I have reviewed  the patients home medicines and have made adjustments as needed   Critical Interventions:  ivfs   Problem List / ED Course:  Syncope:  likely a combination of vasovagal from pushing to have a bm and orthostatic from dehydration.  Pt feels much better after fluids and eating a sandwich.  Port malfunction:  IV team placed tpa in the port and will flush/drain.   Reevaluation:  After the interventions noted above, I reevaluated the patient and found that they have :improved   Social Determinants of Health:  Lives at home with husband   Dispostion:  After consideration of the diagnostic results and the patients response to treatment, I feel that the patent would benefit from discharge with outpatient f/u.          Final Clinical Impression(s) / ED Diagnoses Final diagnoses:  Dehydration  Vasovagal syncope    Rx / DC Orders ED Discharge Orders     None         Allison Pence, MD 06/05/22 1436

## 2022-06-05 NOTE — Progress Notes (Signed)
IV consult: Assessed R chest PAC for blood return. Patient's port flushes easily but unable to get a blood return. As per recommendation, to refer patient to IR d/t to history of not being able to get a blood return when accessed and position of the tip of the catheter. RN aware.

## 2022-06-06 ENCOUNTER — Other Ambulatory Visit: Payer: Self-pay | Admitting: Hematology and Oncology

## 2022-06-07 ENCOUNTER — Other Ambulatory Visit: Payer: Self-pay | Admitting: Oncology

## 2022-06-07 ENCOUNTER — Other Ambulatory Visit: Payer: Self-pay | Admitting: Hematology and Oncology

## 2022-06-07 DIAGNOSIS — Z171 Estrogen receptor negative status [ER-]: Secondary | ICD-10-CM

## 2022-06-08 ENCOUNTER — Other Ambulatory Visit: Payer: Self-pay

## 2022-06-08 DIAGNOSIS — C7951 Secondary malignant neoplasm of bone: Secondary | ICD-10-CM | POA: Diagnosis not present

## 2022-06-08 DIAGNOSIS — C50912 Malignant neoplasm of unspecified site of left female breast: Secondary | ICD-10-CM | POA: Diagnosis not present

## 2022-06-08 DIAGNOSIS — T451X5D Adverse effect of antineoplastic and immunosuppressive drugs, subsequent encounter: Secondary | ICD-10-CM | POA: Diagnosis not present

## 2022-06-08 DIAGNOSIS — B029 Zoster without complications: Secondary | ICD-10-CM | POA: Diagnosis not present

## 2022-06-08 DIAGNOSIS — D701 Agranulocytosis secondary to cancer chemotherapy: Secondary | ICD-10-CM | POA: Diagnosis not present

## 2022-06-08 DIAGNOSIS — I110 Hypertensive heart disease with heart failure: Secondary | ICD-10-CM | POA: Diagnosis not present

## 2022-06-09 ENCOUNTER — Ambulatory Visit (INDEPENDENT_AMBULATORY_CARE_PROVIDER_SITE_OTHER): Payer: Medicare Other | Admitting: Physician Assistant

## 2022-06-09 DIAGNOSIS — C50412 Malignant neoplasm of upper-outer quadrant of left female breast: Secondary | ICD-10-CM

## 2022-06-09 DIAGNOSIS — Z171 Estrogen receptor negative status [ER-]: Secondary | ICD-10-CM

## 2022-06-09 NOTE — Progress Notes (Signed)
This is a pleasant 76 year old female here for follow-up evaluation status post left breast reconstruction placement of tissue expanders and Flex HD by Dr. Claudia Desanctis after left total mastectomy and deep left axillary sentinel lymph node biopsy by Dr. Ninfa Linden on 04/07/2022.  The patient had a [place] high-profile expander placed.  250 cc of saline was placed within the expander intraoperatively.  She was last seen in our office on 04/21/2022.  At that time she had been doing very well without significant complaints or concerns.  She did have injectable saline placed in the expander totaling 375/375 cc.   Since her last office visit on 04/28/2022 the patient has had some issues, she is current receiving chemotherapy and has had several episodes of syncope and ER visits.  She is on hold from her chemotherapy but will be resuming shortly.  She denies any issues or concerns with her expander.  Our plan for the patient would be to take her back to the OR for expander to implant once she has completed chemotherapy.  At this time it does appear she is having some difficulties with chemotherapy and tolerating this.  I discussed the risk and benefits of waiting until after chemotherapy with the patient and her husband we do not feel that she would tolerate surgery at this time.  They both understand the rationale behind this.  We would like to see the patient back in our office in several months for repeat evaluation and future planning.  She was instructed return immediately if she develops any new or worsening signs or symptoms.  Both the patient and her husband verbalized understanding and agreement to today's plan.  Tentatively we will plan for left expander to implant, and also replacing the right previously placed implant.

## 2022-06-10 ENCOUNTER — Other Ambulatory Visit: Payer: Self-pay

## 2022-06-14 DIAGNOSIS — C50912 Malignant neoplasm of unspecified site of left female breast: Secondary | ICD-10-CM | POA: Diagnosis not present

## 2022-06-14 DIAGNOSIS — D701 Agranulocytosis secondary to cancer chemotherapy: Secondary | ICD-10-CM | POA: Diagnosis not present

## 2022-06-14 DIAGNOSIS — T451X5D Adverse effect of antineoplastic and immunosuppressive drugs, subsequent encounter: Secondary | ICD-10-CM | POA: Diagnosis not present

## 2022-06-14 DIAGNOSIS — I110 Hypertensive heart disease with heart failure: Secondary | ICD-10-CM | POA: Diagnosis not present

## 2022-06-14 DIAGNOSIS — B029 Zoster without complications: Secondary | ICD-10-CM | POA: Diagnosis not present

## 2022-06-14 DIAGNOSIS — C7951 Secondary malignant neoplasm of bone: Secondary | ICD-10-CM | POA: Diagnosis not present

## 2022-06-16 ENCOUNTER — Inpatient Hospital Stay: Payer: Medicare Other

## 2022-06-16 ENCOUNTER — Inpatient Hospital Stay: Payer: Medicare Other | Admitting: Hematology and Oncology

## 2022-06-19 ENCOUNTER — Inpatient Hospital Stay: Payer: Medicare Other

## 2022-06-19 DIAGNOSIS — D701 Agranulocytosis secondary to cancer chemotherapy: Secondary | ICD-10-CM | POA: Diagnosis not present

## 2022-06-19 DIAGNOSIS — F32A Depression, unspecified: Secondary | ICD-10-CM | POA: Diagnosis not present

## 2022-06-19 DIAGNOSIS — Z87440 Personal history of urinary (tract) infections: Secondary | ICD-10-CM | POA: Diagnosis not present

## 2022-06-19 DIAGNOSIS — E785 Hyperlipidemia, unspecified: Secondary | ICD-10-CM | POA: Diagnosis not present

## 2022-06-19 DIAGNOSIS — I110 Hypertensive heart disease with heart failure: Secondary | ICD-10-CM | POA: Diagnosis not present

## 2022-06-19 DIAGNOSIS — E876 Hypokalemia: Secondary | ICD-10-CM | POA: Diagnosis not present

## 2022-06-19 DIAGNOSIS — I5031 Acute diastolic (congestive) heart failure: Secondary | ICD-10-CM | POA: Diagnosis not present

## 2022-06-19 DIAGNOSIS — K1230 Oral mucositis (ulcerative), unspecified: Secondary | ICD-10-CM | POA: Diagnosis not present

## 2022-06-19 DIAGNOSIS — C50912 Malignant neoplasm of unspecified site of left female breast: Secondary | ICD-10-CM | POA: Diagnosis not present

## 2022-06-19 DIAGNOSIS — K219 Gastro-esophageal reflux disease without esophagitis: Secondary | ICD-10-CM | POA: Diagnosis not present

## 2022-06-19 DIAGNOSIS — E8729 Other acidosis: Secondary | ICD-10-CM | POA: Diagnosis not present

## 2022-06-19 DIAGNOSIS — T451X5D Adverse effect of antineoplastic and immunosuppressive drugs, subsequent encounter: Secondary | ICD-10-CM | POA: Diagnosis not present

## 2022-06-19 DIAGNOSIS — K529 Noninfective gastroenteritis and colitis, unspecified: Secondary | ICD-10-CM | POA: Diagnosis not present

## 2022-06-19 DIAGNOSIS — C7951 Secondary malignant neoplasm of bone: Secondary | ICD-10-CM | POA: Diagnosis not present

## 2022-06-19 DIAGNOSIS — B029 Zoster without complications: Secondary | ICD-10-CM | POA: Diagnosis not present

## 2022-06-19 DIAGNOSIS — F419 Anxiety disorder, unspecified: Secondary | ICD-10-CM | POA: Diagnosis not present

## 2022-06-20 NOTE — Progress Notes (Signed)
Patient Care Team: Nicholas Lose, MD as PCP - General (Hematology and Oncology) Cameron Sprang, MD as Consulting Physician (Neurology) Mauro Kaufmann, RN as Oncology Nurse Navigator Rockwell Germany, RN as Oncology Nurse Navigator Coralie Keens, MD as Consulting Physician (General Surgery) Nicholas Lose, MD as Consulting Physician (Hematology and Oncology) Eppie Gibson, MD as Attending Physician (Radiation Oncology)  DIAGNOSIS: No diagnosis found.  SUMMARY OF ONCOLOGIC HISTORY: Oncology History  Malignant neoplasm of upper-outer quadrant of left breast in female, estrogen receptor negative ([place])  02/10/2022 Initial Diagnosis   Palpable left breast masses and calcifications: 1.8 cm at 1:00 and 1.7 cm at 9:00, calcifications not biopsy.  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 Report date is 03/05/2022.  The CustomNext-Cancer+RNAinsight panel offered by [facility] includes sequencing and rearrangement analysis for the following 48 genes:  APC, ATM, AXIN2, BARD1, BMPR1A, BRCA1, BRCA2, BRIP1, CDH1, CDK4, CDKN2A, CHEK2, CTNNA1, DICER1, EGFR, EPCAM, GREM1, HOXB13, KIT, MEN1, MLH1, MSH2, MSH3, MSH6, MUTYH, NBN, NF1, NTHL1, PALB2, PDGFRA, PMS2, POLD1, POLE, PTEN, RAD50, RAD51C, RAD51D, SDHA, SDHB, SDHC, SDHD, SMAD4, SMARCA4, STK11, TP53, TSC1, TSC2, and VHL.  RNA data is routinely analyzed for use in variant interpretation for all genes.   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, LCIS, 5/5 lymph nodes positive with extranodal extension, margins  negative ER 0%, PR 0%, HER2 3+, Ki-67 20%   04/27/2022 Cancer Staging   Staging form: Breast, AJCC 8th Edition - Pathologic: Stage IIIA (pT2, pN2, cM0, G2, ER-, PR-, HER2+) - Signed by Nicholas Lose, MD on 04/27/2022 Histologic grading system: 3 grade system   05/05/2022 - 05/26/2022 Chemotherapy   Patient is on Treatment Plan : BREAST DOCEtaxel + Trastuzumab + Pertuzumab (THP) q21d x 8 cycles / Trastuzumab + Pertuzumab q21d x 4 cycles     05/26/2022 -  Chemotherapy   Patient is on Treatment Plan : BREAST MAINTENANCE Trastuzumab IV (6) or SQ (600) D1 q21d X 11 Cycles       CHIEF COMPLIANT: Follow-up metastatic breast cancer  INTERVAL HISTORY: Allison Pierce is a 76 y.o with the above mention metastatic breast cancer. She presents to the clinic today for a follow-up.    ALLERGIES:  is allergic to gadolinium derivatives, atorvastatin calcium, azelaic acid, oxybutynin, prozac [fluoxetine hcl], ritalin [methylphenidate], rosuvastatin, and lyrica [pregabalin].  MEDICATIONS:  Current Outpatient Medications  Medication Sig Dispense Refill   acetaminophen (TYLENOL) 500 MG tablet Take 500 mg by mouth at bedtime.     B Complex-C (B COMPLEX-VITAMIN C) CAPS Take 1 capsule by mouth daily.     buPROPion (WELLBUTRIN XL) 300 MG 24 hr tablet Take 300 mg by mouth every morning.     calcium carbonate (TUMS - DOSED IN MG ELEMENTAL CALCIUM) 500 MG chewable tablet Chew 2 tablets by mouth daily as needed for indigestion or heartburn.     cholecalciferol (VITAMIN D3) 25 MCG (1000 UNIT) tablet Take 1,000 Units by mouth daily.     clonazePAM (KLONOPIN) 1 MG tablet Take 1 mg by mouth at bedtime.  Cyanocobalamin (VITAMIN B-12) 5000 MCG TBDP Take 5,000 Units by mouth daily.     diphenhydrAMINE (BENADRYL) 25 MG tablet Take 25 mg by mouth in the morning and at bedtime.     fluconazole (DIFLUCAN) 100 MG tablet Take 1 tablet (100 mg total) by mouth daily. (Patient not taking: Reported on 06/05/2022) 15 tablet 0    fluticasone (FLONASE) 50 MCG/ACT nasal spray Place 1 spray into both nostrils daily as needed for allergies.     letrozole (FEMARA) 2.5 MG tablet Take 2.5 mg by mouth daily. (Patient not taking: Reported on 06/05/2022)     losartan (COZAAR) 25 MG tablet Take 50 mg by mouth daily.     magic mouthwash w/lidocaine SOLN Take 5 mLs by mouth 4 (four) times daily as needed for mouth pain. (Patient not taking: Reported on 06/05/2022) 240 mL 1   meloxicam (MOBIC) 15 MG tablet Take 15 mg by mouth daily.     Potassium 99 MG TABS Take 99 mg by mouth at bedtime.     traZODone (DESYREL) 150 MG tablet Take 225 mg by mouth at bedtime.     venlafaxine XR (EFFEXOR-XR) 75 MG 24 hr capsule Take 75 mg by mouth daily with breakfast.     No current facility-administered medications for this visit.    PHYSICAL EXAMINATION: ECOG PERFORMANCE STATUS: {CHL ONC ECOG PS:[phone]}  There were no vitals filed for this visit. There were no vitals filed for this visit.  BREAST:*** No palpable masses or nodules in either right or left breasts. No palpable axillary supraclavicular or infraclavicular adenopathy no breast tenderness or nipple discharge. (exam performed in the presence of a chaperone)  LABORATORY DATA:  I have reviewed the data as listed    Latest Ref Rng & Units 06/05/2022   10:21 AM 05/26/2022    9:05 AM 05/22/2022    9:56 AM  CMP  Glucose 70 - 99 mg/dL 120  99  94   BUN 8 - 23 mg/dL _0 Creatinine 0.44 - 1.00 mg/dL 0.77  0.67  0.77   Sodium 135 - 145 mmol/L 137  137  136   Potassium 3.5 - 5.1 mmol/L 3.6  4.0  3.4   Chloride 98 - 111 mmol/L 107  105  104   CO2 22 - 32 mmol/L _1 Calcium 8.9 - 10.3 mg/dL 8.7  8.9  9.0   Total Protein 6.5 - 8.1 g/dL 6.1  5.9  6.2   Total Bilirubin 0.3 - 1.2 mg/dL 0.5  0.4  0.4   Alkaline Phos 38 - 126 U/L 76  91  98   AST 15 - 41 U/L 25  26  58   ALT 0 - 44 U/L 25  32  55     Lab Results  Component Value Date   WBC 5.8 06/05/2022   HGB 10.7 (L)  06/05/2022   HCT 33.4 (L) 06/05/2022   MCV 89.3 06/05/2022   PLT 316 06/05/2022   NEUTROABS 3.9 06/05/2022    ASSESSMENT & PLAN:  No problem-specific Assessment & Plan notes found for this encounter.    No orders of the defined types were placed in this encounter.  The patient has a good understanding of the overall plan. she agrees with it. she will call with any problems that may develop before the next visit here. Total time spent: 30 mins including face to face time and time spent for planning, charting and  co-ordination of care   Suzzette Righter, [place] 06/20/22    I Gardiner Coins am scribing for Dr. Lindi Adie  ***

## 2022-06-21 ENCOUNTER — Inpatient Hospital Stay: Payer: Medicare Other | Attending: Hematology and Oncology

## 2022-06-21 ENCOUNTER — Inpatient Hospital Stay: Payer: Medicare Other

## 2022-06-21 ENCOUNTER — Inpatient Hospital Stay (HOSPITAL_BASED_OUTPATIENT_CLINIC_OR_DEPARTMENT_OTHER): Payer: Medicare Other | Admitting: Hematology and Oncology

## 2022-06-21 ENCOUNTER — Other Ambulatory Visit: Payer: Self-pay

## 2022-06-21 VITALS — BP 138/77 | HR 78 | Temp 97.9°F | Resp 18

## 2022-06-21 DIAGNOSIS — Z171 Estrogen receptor negative status [ER-]: Secondary | ICD-10-CM | POA: Insufficient documentation

## 2022-06-21 DIAGNOSIS — T451X5A Adverse effect of antineoplastic and immunosuppressive drugs, initial encounter: Secondary | ICD-10-CM | POA: Insufficient documentation

## 2022-06-21 DIAGNOSIS — K5903 Drug induced constipation: Secondary | ICD-10-CM | POA: Insufficient documentation

## 2022-06-21 DIAGNOSIS — Z9012 Acquired absence of left breast and nipple: Secondary | ICD-10-CM | POA: Diagnosis not present

## 2022-06-21 DIAGNOSIS — Z923 Personal history of irradiation: Secondary | ICD-10-CM | POA: Insufficient documentation

## 2022-06-21 DIAGNOSIS — C50412 Malignant neoplasm of upper-outer quadrant of left female breast: Secondary | ICD-10-CM | POA: Diagnosis not present

## 2022-06-21 DIAGNOSIS — Z5112 Encounter for antineoplastic immunotherapy: Secondary | ICD-10-CM | POA: Insufficient documentation

## 2022-06-21 DIAGNOSIS — I5032 Chronic diastolic (congestive) heart failure: Secondary | ICD-10-CM

## 2022-06-21 DIAGNOSIS — Z95828 Presence of other vascular implants and grafts: Secondary | ICD-10-CM

## 2022-06-21 LAB — CBC WITH DIFFERENTIAL (CANCER CENTER ONLY)
Abs Immature Granulocytes: 0.01 10*3/uL (ref 0.00–0.07)
Basophils Absolute: 0.1 10*3/uL (ref 0.0–0.1)
Basophils Relative: 1 %
Eosinophils Absolute: 0.2 10*3/uL (ref 0.0–0.5)
Eosinophils Relative: 4 %
HCT: 32.2 % — ABNORMAL LOW (ref 36.0–46.0)
Hemoglobin: 10.6 g/dL — ABNORMAL LOW (ref 12.0–15.0)
Immature Granulocytes: 0 %
Lymphocytes Relative: 18 %
Lymphs Abs: 0.9 10*3/uL (ref 0.7–4.0)
MCH: 28.2 pg (ref 26.0–34.0)
MCHC: 32.9 g/dL (ref 30.0–36.0)
MCV: 85.6 fL (ref 80.0–100.0)
Monocytes Absolute: 0.4 10*3/uL (ref 0.1–1.0)
Monocytes Relative: 8 %
Neutro Abs: 3.5 10*3/uL (ref 1.7–7.7)
Neutrophils Relative %: 69 %
Platelet Count: 260 10*3/uL (ref 150–400)
RBC: 3.76 MIL/uL — ABNORMAL LOW (ref 3.87–5.11)
RDW: 13.8 % (ref 11.5–15.5)
WBC Count: 5.1 10*3/uL (ref 4.0–10.5)
nRBC: 0 % (ref 0.0–0.2)

## 2022-06-21 LAB — CMP (CANCER CENTER ONLY)
ALT: 19 U/L (ref 0–44)
AST: 20 U/L (ref 15–41)
Albumin: 3.7 g/dL (ref 3.5–5.0)
Alkaline Phosphatase: 79 U/L (ref 38–126)
Anion gap: 4 — ABNORMAL LOW (ref 5–15)
BUN: 12 mg/dL (ref 8–23)
CO2: 28 mmol/L (ref 22–32)
Calcium: 8.6 mg/dL — ABNORMAL LOW (ref 8.9–10.3)
Chloride: 107 mmol/L (ref 98–111)
Creatinine: 0.85 mg/dL (ref 0.44–1.00)
GFR, Estimated: >60 mL/min (ref >60)
Glucose, Bld: 115 mg/dL — ABNORMAL HIGH (ref 70–99)
Potassium: 4.1 mmol/L (ref 3.5–5.1)
Sodium: 139 mmol/L (ref 135–145)
Total Bilirubin: 0.4 mg/dL (ref 0.3–1.2)
Total Protein: 5.7 g/dL — ABNORMAL LOW (ref 6.5–8.1)

## 2022-06-21 MED ORDER — SODIUM CHLORIDE 0.9 % IV SOLN: Freq: Once | INTRAVENOUS | Status: AC

## 2022-06-21 MED ORDER — SODIUM CHLORIDE 0.9% FLUSH
10.0000 mL | Freq: Once | INTRAVENOUS | Status: AC
  Administered 2022-06-21: 10 mL

## 2022-06-21 MED ORDER — ACETAMINOPHEN 325 MG PO TABS
650.0000 mg | ORAL_TABLET | Freq: Once | ORAL | Status: AC
  Administered 2022-06-21: 650 mg via ORAL
  Filled 2022-06-21: qty 2

## 2022-06-21 MED ORDER — DIPHENHYDRAMINE HCL 25 MG PO CAPS
25.0000 mg | ORAL_CAPSULE | Freq: Once | ORAL | Status: AC
  Administered 2022-06-21: 25 mg via ORAL
  Filled 2022-06-21: qty 1

## 2022-06-21 MED ORDER — HEPARIN SOD (PORK) LOCK FLUSH 100 UNIT/ML IV SOLN
500.0000 [IU] | Freq: Once | INTRAVENOUS | Status: AC | PRN
  Administered 2022-06-21: 500 [IU]

## 2022-06-21 MED ORDER — TRASTUZUMAB-DKST CHEMO 150 MG IV SOLR
6.0000 mg/kg | Freq: Once | INTRAVENOUS | Status: AC
  Administered 2022-06-21: 420 mg via INTRAVENOUS
  Filled 2022-06-21: qty 20

## 2022-06-21 MED ORDER — SODIUM CHLORIDE 0.9% FLUSH
10.0000 mL | INTRAVENOUS | Status: DC | PRN
  Administered 2022-06-21: 10 mL

## 2022-06-21 MED ORDER — SODIUM CHLORIDE 0.9 % IV SOLN
420.0000 mg | Freq: Once | INTRAVENOUS | Status: AC
  Administered 2022-06-21: 420 mg via INTRAVENOUS
  Filled 2022-06-21: qty 14

## 2022-06-21 NOTE — Assessment & Plan Note (Addendum)
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 T4 vertebral body Bone scan 04/25/2022: Abnormal uptake in the sternum, right parietal region of calvarium, right posterior seventh rib, T5, left sternoclavicular joint  Treatment: Palliative chemotherapy with Taxotere [name] and Perjeta every 3 weeks x1 cycle given 05/05/2022 (discontinued for severe toxicities and hospitalization)   ---------------------------------------------------------------------------------------------------------------------------------------------- Chemotoxicities: Hospitalization 05/11/2022-05/18/2022: Convulsive syncope, chemo induced neutropenia, oral mucositis, chronic CHF, severe diarrhea with dehydration  Treatment plan: Treated with single agent [name].  Today we will be adding Perjeta to her treatment.  Severe constipation:Encouraged her to use Dulcolax as needed for constipation.  Return to clinic in 3 weeks

## 2022-06-21 NOTE — Patient Instructions (Signed)
[place] Discharge Instructions for Patients Receiving Chemotherapy  Today you received the following chemotherapy agents: Trastuzumab   To help prevent nausea and vomiting after your treatment, we encourage you to take your nausea medication as directed.     If you develop nausea and vomiting that is not controlled by your nausea medication, call the clinic.   BELOW ARE SYMPTOMS THAT SHOULD BE REPORTED IMMEDIATELY: *FEVER GREATER THAN 100.5 F *CHILLS WITH OR WITHOUT FEVER NAUSEA AND VOMITING THAT IS NOT CONTROLLED WITH YOUR NAUSEA MEDICATION *UNUSUAL SHORTNESS OF BREATH *UNUSUAL BRUISING OR BLEEDING TENDERNESS IN MOUTH AND THROAT WITH OR WITHOUT PRESENCE OF ULCERS *URINARY PROBLEMS *BOWEL PROBLEMS UNUSUAL RASH Items with * indicate a potential emergency and should be followed up as soon as possible.  Feel free to call the clinic you have any questions or concerns. The clinic phone number is [phone].  Trastuzumab Injection What is this medication? TRASTUZUMAB (tras TOO zoo mab) treats breast cancer and stomach cancer. It works by blocking a protein that causes cancer cells to grow and multiply. This helps to slow or stop the spread of cancer cells. This medicine may be used for other purposes; ask your health care provider or pharmacist if you have questions. COMMON BRAND NAME(S): [name], Janae Bridgeman, Ontruzant, Trazimera What should I tell my care team before I take this medication? They need to know if you have any of these conditions: Heart failure Lung disease An unusual or allergic reaction to trastuzumab, other medications, foods, dyes, or preservatives Pregnant or trying to get pregnant Breast-feeding How should I use this medication? This medication is injected into a vein. It is given by your care team in a hospital or clinic setting. Talk to your care team about the use of this medication in children. It is not approved for use  in children. Overdosage: If you think you have taken too much of this medicine contact a poison control center or emergency room at once. NOTE: This medicine is only for you. Do not share this medicine with others. What if I miss a dose? Keep appointments for follow-up doses. It is important not to miss your dose. Call your care team if you are unable to keep an appointment. What may interact with this medication? Certain types of chemotherapy, such as daunorubicin, doxorubicin, epirubicin, idarubicin This list may not describe all possible interactions. Give your health care provider a list of all the medicines, herbs, non-prescription drugs, or dietary supplements you use. Also tell them if you smoke, drink alcohol, or use illegal drugs. Some items may interact with your medicine. What should I watch for while using this medication? Your condition will be monitored carefully while you are receiving this medication. This medication may make you feel generally unwell. This is not uncommon, as chemotherapy affects healthy cells as well as cancer cells. Report any side effects. Continue your course of treatment even though you feel ill unless your care team tells you to stop. This medication may increase your risk of getting an infection. Call your care team for advice if you get a fever, chills, sore throat, or other symptoms of a cold or flu. Do not treat yourself. Try to avoid being around people who are sick. Avoid taking medications that contain aspirin, acetaminophen, ibuprofen, naproxen, or ketoprofen unless instructed by your care team. These medications can hide a fever. Talk to your care team if you may be pregnant. Serious birth defects can occur if you take this  medication during pregnancy and for 7 months after the last dose. You will need a negative pregnancy test before starting this medication. Contraception is recommended while taking this medication and for 7 months after the last dose.  Your care team can help you find the option that works for you. Do not breastfeed while taking this medication and for 7 months after stopping treatment. What side effects may I notice from receiving this medication? Side effects that you should report to your care team as soon as possible: Allergic reactions or angioedema--skin rash, itching or hives, swelling of the face, eyes, lips, tongue, arms, or legs, trouble swallowing or breathing Dry cough, shortness of breath or trouble breathing Heart failure--shortness of breath, swelling of the ankles, feet, or hands, sudden weight gain, unusual weakness or fatigue Infection--fever, chills, cough, or sore throat Infusion reactions--chest pain, shortness of breath or trouble breathing, feeling faint or lightheaded Side effects that usually do not require medical attention (report to your care team if they continue or are bothersome): Diarrhea Dizziness Headache Nausea Trouble sleeping Vomiting This list may not describe all possible side effects. Call your doctor for medical advice about side effects. You may report side effects to FDA at 1-800-FDA-1088. Where should I keep my medication? This medication is given in a hospital or clinic. It will not be stored at home. NOTE: This sheet is a summary. It may not cover all possible information. If you have questions about this medicine, talk to your doctor, pharmacist, or health care provider.  2023 Elsevier/Gold Standard (2022-01-31 00:00:00)

## 2022-06-21 NOTE — Progress Notes (Signed)
Ok per Dr. Lindi Adie to use ECHO from 03/01/22 for today's treatment.

## 2022-06-21 NOTE — Progress Notes (Signed)
BP 138/77 (BP Location: Right Arm, Patient Position: Sitting)   Pulse 78   Temp 97.9 F (36.6 C) (Oral)   Resp 18   SpO2 100%  Patient did well with her perjeta and has had it before- refused 30 minute observation.

## 2022-06-23 DIAGNOSIS — B029 Zoster without complications: Secondary | ICD-10-CM | POA: Diagnosis not present

## 2022-06-23 DIAGNOSIS — C50912 Malignant neoplasm of unspecified site of left female breast: Secondary | ICD-10-CM | POA: Diagnosis not present

## 2022-06-23 DIAGNOSIS — D701 Agranulocytosis secondary to cancer chemotherapy: Secondary | ICD-10-CM | POA: Diagnosis not present

## 2022-06-23 DIAGNOSIS — C7951 Secondary malignant neoplasm of bone: Secondary | ICD-10-CM | POA: Diagnosis not present

## 2022-06-23 DIAGNOSIS — I110 Hypertensive heart disease with heart failure: Secondary | ICD-10-CM | POA: Diagnosis not present

## 2022-06-23 DIAGNOSIS — T451X5D Adverse effect of antineoplastic and immunosuppressive drugs, subsequent encounter: Secondary | ICD-10-CM | POA: Diagnosis not present

## 2022-06-25 ENCOUNTER — Other Ambulatory Visit: Payer: Self-pay

## 2022-06-26 ENCOUNTER — Ambulatory Visit (HOSPITAL_COMMUNITY)
Admission: RE | Admit: 2022-06-26 | Discharge: 2022-06-26 | Disposition: A | Discharge status: Home / Self Care | Payer: Medicare Other | Source: Ambulatory Visit | Attending: Hematology and Oncology | Admitting: Hematology and Oncology

## 2022-06-26 DIAGNOSIS — C50412 Malignant neoplasm of upper-outer quadrant of left female breast: Secondary | ICD-10-CM | POA: Diagnosis not present

## 2022-06-26 DIAGNOSIS — Z171 Estrogen receptor negative status [ER-]: Secondary | ICD-10-CM | POA: Insufficient documentation

## 2022-06-26 DIAGNOSIS — Z0189 Encounter for other specified special examinations: Secondary | ICD-10-CM

## 2022-06-26 DIAGNOSIS — I5032 Chronic diastolic (congestive) heart failure: Secondary | ICD-10-CM

## 2022-06-26 DIAGNOSIS — I340 Nonrheumatic mitral (valve) insufficiency: Secondary | ICD-10-CM | POA: Diagnosis not present

## 2022-06-26 LAB — ECHOCARDIOGRAM COMPLETE
AR max vel: 1.71 cm2
AV Area VTI: 1.63 cm2
AV Area mean vel: 1.66 cm2
AV Mean grad: 3 mmHg
AV Peak grad: 5.6 mmHg
Ao pk vel: 1.18 m/s
Area-P 1/2: 3.89 cm2
MV M vel: 3.08 m/s
MV Peak grad: 37.9 mmHg
S' Lateral: 2.9 cm

## 2022-06-30 ENCOUNTER — Other Ambulatory Visit: Payer: Self-pay | Admitting: *Deleted

## 2022-06-30 ENCOUNTER — Other Ambulatory Visit: Payer: Self-pay

## 2022-06-30 DIAGNOSIS — C7951 Secondary malignant neoplasm of bone: Secondary | ICD-10-CM | POA: Diagnosis not present

## 2022-06-30 DIAGNOSIS — D701 Agranulocytosis secondary to cancer chemotherapy: Secondary | ICD-10-CM | POA: Diagnosis not present

## 2022-06-30 DIAGNOSIS — C50912 Malignant neoplasm of unspecified site of left female breast: Secondary | ICD-10-CM | POA: Diagnosis not present

## 2022-06-30 DIAGNOSIS — I110 Hypertensive heart disease with heart failure: Secondary | ICD-10-CM | POA: Diagnosis not present

## 2022-06-30 DIAGNOSIS — T451X5D Adverse effect of antineoplastic and immunosuppressive drugs, subsequent encounter: Secondary | ICD-10-CM | POA: Diagnosis not present

## 2022-06-30 DIAGNOSIS — C50412 Malignant neoplasm of upper-outer quadrant of left female breast: Secondary | ICD-10-CM

## 2022-06-30 DIAGNOSIS — B029 Zoster without complications: Secondary | ICD-10-CM | POA: Diagnosis not present

## 2022-06-30 NOTE — Progress Notes (Signed)
Due to decreased EF while on trastuzumab, verbal orders received from MD to refer pt to Dr. Haroldine Laws for further evaluation.  Referral placed, pt notified and verbalized understanding.

## 2022-07-05 DIAGNOSIS — C50912 Malignant neoplasm of unspecified site of left female breast: Secondary | ICD-10-CM | POA: Diagnosis not present

## 2022-07-05 DIAGNOSIS — D701 Agranulocytosis secondary to cancer chemotherapy: Secondary | ICD-10-CM | POA: Diagnosis not present

## 2022-07-05 DIAGNOSIS — B029 Zoster without complications: Secondary | ICD-10-CM | POA: Diagnosis not present

## 2022-07-05 DIAGNOSIS — C7951 Secondary malignant neoplasm of bone: Secondary | ICD-10-CM | POA: Diagnosis not present

## 2022-07-05 DIAGNOSIS — I110 Hypertensive heart disease with heart failure: Secondary | ICD-10-CM | POA: Diagnosis not present

## 2022-07-05 DIAGNOSIS — T451X5D Adverse effect of antineoplastic and immunosuppressive drugs, subsequent encounter: Secondary | ICD-10-CM | POA: Diagnosis not present

## 2022-07-06 ENCOUNTER — Encounter: Payer: Self-pay | Admitting: *Deleted

## 2022-07-07 ENCOUNTER — Inpatient Hospital Stay: Payer: Medicare Other

## 2022-07-07 ENCOUNTER — Inpatient Hospital Stay: Payer: Medicare Other | Admitting: Hematology and Oncology

## 2022-07-07 NOTE — Progress Notes (Signed)
 Patient Care Team: Shaw, Kimberlee, MD as PCP - General (Family Medicine) Aquino, Karen M, MD as Consulting Physician (Neurology) Stuart, Dawn C, RN as Oncology Nurse Navigator Martini, Keisha N, RN as Oncology Nurse Navigator Blackman, Douglas, MD as Consulting Physician (General Surgery) Gudena, Vinay, MD as Consulting Physician (Hematology and Oncology) Squire, Sarah, MD as Attending Physician (Radiation Oncology)  DIAGNOSIS: No diagnosis found.  SUMMARY OF ONCOLOGIC HISTORY: Oncology History  Malignant neoplasm of upper-outer quadrant of left breast in female, estrogen receptor negative (HCC)  02/10/2022 Initial Diagnosis   Palpable left breast masses and calcifications: 1.8 cm at 1:00 and 1.7 cm at 9:00, calcifications not biopsy.  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 Report date is 03/05/2022.  The CustomNext-Cancer+RNAinsight panel offered by [facility] [employer] includes sequencing and rearrangement analysis for the following 48 genes:  APC, ATM, AXIN2, BARD1, BMPR1A, BRCA1, BRCA2, BRIP1, CDH1, CDK4, CDKN2A, CHEK2, CTNNA1, DICER1, EGFR, EPCAM, GREM1, HOXB13, KIT, MEN1, MLH1, MSH2, MSH3, MSH6, MUTYH, NBN, NF1, NTHL1, PALB2, PDGFRA, PMS2, POLD1, POLE, PTEN, RAD50, RAD51C, RAD51D, SDHA, SDHB, SDHC, SDHD, SMAD4, SMARCA4, STK11, TP53, TSC1, TSC2, and VHL.  RNA data is routinely analyzed for use in variant interpretation for all genes.   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, LCIS, 5/5 lymph nodes positive with extranodal extension, margins  negative ER 0%, PR 0%, HER2 3+, Ki-67 20%   04/27/2022 Cancer Staging   Staging form: Breast, AJCC 8th Edition - Pathologic: Stage IIIA (pT2, pN2, cM0, G2, ER-, PR-, HER2+) - Signed by Gudena, Vinay, MD on 04/27/2022 Histologic grading system: 3 grade system   05/05/2022 - 05/26/2022 Chemotherapy   Patient is on Treatment Plan : BREAST DOCEtaxel + Trastuzumab + Pertuzumab (THP) q21d x 8 cycles / Trastuzumab + Pertuzumab q21d x 4 cycles     05/26/2022 -  Chemotherapy   Patient is on Treatment Plan : BREAST MAINTENANCE Trastuzumab IV (6) or SQ (600) D1 q21d X 11 Cycles       CHIEF COMPLIANT: Follow-up metastatic breast cancer  INTERVAL HISTORY: Allison Pierce is a 76 y.o with the above mention metastatic breast cancer. She presents to the clinic today for a follow-up.    ALLERGIES:  is allergic to gadolinium derivatives, atorvastatin calcium, azelaic acid, oxybutynin, prozac [fluoxetine hcl], ritalin [methylphenidate], rosuvastatin, and lyrica [pregabalin].  MEDICATIONS:  Current Outpatient Medications  Medication Sig Dispense Refill   acetaminophen (TYLENOL) 500 MG tablet Take 500 mg by mouth at bedtime.     B Complex-C (B COMPLEX-VITAMIN C) CAPS Take 1 capsule by mouth daily.     buPROPion (WELLBUTRIN XL) 300 MG 24 hr tablet Take 300 mg by mouth every morning.     calcium carbonate (TUMS - DOSED IN MG ELEMENTAL CALCIUM) 500 MG chewable tablet Chew 2 tablets by mouth daily as needed for indigestion or heartburn.     cholecalciferol (VITAMIN D3) 25 MCG (1000 UNIT) tablet Take 1,000 Units by mouth daily.     clonazePAM (KLONOPIN) 1 MG tablet Take 1 mg by mouth at bedtime.       Cyanocobalamin (VITAMIN B-12) 5000 MCG TBDP Take 5,000 Units by mouth daily.     diphenhydrAMINE (BENADRYL) 25 MG tablet Take 25 mg by mouth in the morning and at bedtime.     fluconazole (DIFLUCAN) 100 MG tablet Take 1 tablet (100 mg total) by mouth daily. 15 tablet 0   fluticasone (FLONASE) 50 MCG/ACT nasal  spray Place 1 spray into both nostrils daily as needed for allergies.     letrozole (FEMARA) 2.5 MG tablet Take 2.5 mg by mouth daily.     losartan (COZAAR) 25 MG tablet Take 50 mg by mouth daily.     magic mouthwash w/lidocaine SOLN Take 5 mLs by mouth 4 (four) times daily as needed for mouth pain. 240 mL 1   meloxicam (MOBIC) 15 MG tablet Take 15 mg by mouth daily.     Potassium 99 MG TABS Take 99 mg by mouth at bedtime.     traZODone (DESYREL) 150 MG tablet Take 225 mg by mouth at bedtime.     venlafaxine XR (EFFEXOR-XR) 75 MG 24 hr capsule Take 75 mg by mouth daily with breakfast.     No current facility-administered medications for this visit.    PHYSICAL EXAMINATION: ECOG PERFORMANCE STATUS: {CHL ONC ECOG PS:[id number]}  There were no vitals filed for this visit. There were no vitals filed for this visit.  BREAST:*** No palpable masses or nodules in either right or left breasts. No palpable axillary supraclavicular or infraclavicular adenopathy no breast tenderness or nipple discharge. (exam performed in the presence of a chaperone)  LABORATORY DATA:  I have reviewed the data as listed    Latest Ref Rng & Units 06/21/2022   11:41 AM 06/05/2022   10:21 AM 05/26/2022    9:05 AM  CMP  Glucose 70 - 99 mg/dL 115  120  99   BUN 8 - 23 mg/dL _0 Creatinine 0.44 - 1.00 mg/dL 0.85  0.77  0.67   Sodium 135 - 145 mmol/L 139  137  137   Potassium 3.5 - 5.1 mmol/L 4.1  3.6  4.0   Chloride 98 - 111 mmol/L 107  107  105   CO2 22 - 32 mmol/L _1 Calcium 8.9 - 10.3 mg/dL 8.6  8.7  8.9   Total Protein 6.5 - 8.1 g/dL 5.7  6.1  5.9   Total Bilirubin 0.3 - 1.2 mg/dL 0.4  0.5  0.4   Alkaline Phos 38 - 126 U/L 79  76  91   AST 15 - 41 U/L _2 ALT 0 - 44 U/L 19  25  32     Lab Results  Component Value Date   WBC 5.1 06/21/2022   HGB 10.6 (L) 06/21/2022   HCT 32.2 (L) 06/21/2022   MCV 85.6 06/21/2022   PLT 260 06/21/2022   NEUTROABS 3.5 06/21/2022     ASSESSMENT & PLAN:  No problem-specific Assessment & Plan notes found for this encounter.    No orders of the defined types were placed in this encounter.  The patient has a good understanding of the overall plan. she agrees with it. she will call with any problems that may develop before the next visit here. Total time spent: 30 mins including face to face time and time spent for planning, charting and co-ordination of care   Suzzette Righter, [place] 07/07/22    I Devyn Griffing, Candela Krul am scribing for  Dr. Gudena  ***  

## 2022-07-10 ENCOUNTER — Telehealth (HOSPITAL_COMMUNITY): Payer: Self-pay | Admitting: Vascular Surgery

## 2022-07-10 ENCOUNTER — Other Ambulatory Visit: Payer: Self-pay | Admitting: *Deleted

## 2022-07-10 DIAGNOSIS — Z171 Estrogen receptor negative status [ER-]: Secondary | ICD-10-CM

## 2022-07-10 NOTE — Progress Notes (Signed)
Per MD request, referral placed to Dr. Phineas Inches with Cardiology for further evaluation of decreased EF on Trastuzumab. Appt scheduled, pt notified and verbalized understanding.

## 2022-07-10 NOTE — Telephone Encounter (Signed)
Left VM giving pt new brst appt w/ db @ 840, asked pt to call back to confirm

## 2022-07-11 ENCOUNTER — Encounter: Payer: Self-pay | Admitting: *Deleted

## 2022-07-11 ENCOUNTER — Ambulatory Visit (HOSPITAL_COMMUNITY)
Admission: RE | Admit: 2022-07-11 | Discharge: 2022-07-11 | Disposition: A | Discharge status: Home / Self Care | Payer: Medicare Other | Source: Ambulatory Visit | Attending: Internal Medicine | Admitting: Internal Medicine

## 2022-07-11 ENCOUNTER — Other Ambulatory Visit: Payer: Self-pay | Admitting: *Deleted

## 2022-07-11 ENCOUNTER — Encounter (HOSPITAL_COMMUNITY): Payer: Self-pay | Admitting: Internal Medicine

## 2022-07-11 VITALS — BP 120/78 | HR 78 | Wt 153.6 lb

## 2022-07-11 DIAGNOSIS — T451X5A Adverse effect of antineoplastic and immunosuppressive drugs, initial encounter: Secondary | ICD-10-CM

## 2022-07-11 DIAGNOSIS — C50212 Malignant neoplasm of upper-inner quadrant of left female breast: Secondary | ICD-10-CM | POA: Insufficient documentation

## 2022-07-11 DIAGNOSIS — Z171 Estrogen receptor negative status [ER-]: Secondary | ICD-10-CM

## 2022-07-11 DIAGNOSIS — I429 Cardiomyopathy, unspecified: Secondary | ICD-10-CM | POA: Insufficient documentation

## 2022-07-11 DIAGNOSIS — I427 Cardiomyopathy due to drug and external agent: Secondary | ICD-10-CM | POA: Diagnosis not present

## 2022-07-11 DIAGNOSIS — I5032 Chronic diastolic (congestive) heart failure: Secondary | ICD-10-CM | POA: Insufficient documentation

## 2022-07-11 MED ORDER — METOPROLOL SUCCINATE ER 25 MG PO TB24: 25.0000 mg | ORAL_TABLET | Freq: Every day | ORAL | 3 refills | Status: DC

## 2022-07-11 NOTE — Progress Notes (Signed)
Per MD okay to treat 07/12/22 with Echo results from 06/26/22.  Dr. Haroldine Laws following pt closely with cardiology.

## 2022-07-11 NOTE — Patient Instructions (Signed)
Start Metoprolol Succinate 25 mg Daily  Your physician recommends that you schedule a follow-up appointment in: 2 months with an echocardiogram  If you have any questions or concerns before your next appointment please send [place] a message through [place] or call our office at [phone].    TO LEAVE A MESSAGE FOR THE NURSE SELECT OPTION 2, PLEASE LEAVE A MESSAGE INCLUDING: YOUR NAME DATE OF BIRTH CALL BACK NUMBER REASON FOR CALL**this is important as we prioritize the call backs  YOU WILL RECEIVE A CALL BACK THE SAME DAY AS LONG AS YOU CALL BEFORE 4:00 PM

## 2022-07-11 NOTE — Progress Notes (Signed)
CARDIO-ONCOLOGY CLINIC CONSULT NOTE  Referring Physician: Dr. Lindi Adie Primary Care: [name], MD Primary Cardiologist: None   HPI:  Allison Pierce is a 76 y.o. female with HTN, HL, anxiety and left breast cancer referred by Dr. Lindi Adie for further evaluation of reduced EF on echocardiogram.   No previous cardiac history. Was diagnosed with left breast CA in 5/23. Stage 1A (ER/PR -, HER2+). Underwent left mastectomy 7/23.   Chemo: 8/23: Docetaxel + Trastuzumab + Pertuzumab (THP) q21d x 8 cycles / Trastuzumab + Pertuzumab q21d x 4 cycles  Maintenance: Trastuzumab IV (6) or SQ (600) D1 q21d X 11 Cycles   Echos: 03/01/22: EF 60-65% G2DD GLs -18.6% 06/26/22:  EF 55-60% GLS -14.1%   Review of Systems: [y] = yes, '[ ]'  = no   General: Weight gain '[ ]' ; Weight loss '[ ]' ; Anorexia '[ ]' ; Fatigue '[ ]' ; Fever '[ ]' ; Chills '[ ]' ; Weakness '[ ]'   Cardiac: Chest pain/pressure '[ ]' ; Resting SOB '[ ]' ; Exertional SOB '[ ]' ; Orthopnea '[ ]' ; Pedal Edema '[ ]' ; Palpitations '[ ]' ; Syncope '[ ]' ; Presyncope '[ ]' ; Paroxysmal nocturnal dyspnea'[ ]'   Pulmonary: Cough '[ ]' ; Wheezing'[ ]' ; Hemoptysis'[ ]' ; Sputum '[ ]' ; Snoring '[ ]'   GI: Vomiting'[ ]' ; Dysphagia'[ ]' ; Melena'[ ]' ; Hematochezia '[ ]' ; Heartburn'[ ]' ; Abdominal pain '[ ]' ; Constipation '[ ]' ; Diarrhea '[ ]' ; BRBPR '[ ]'   GU: Hematuria'[ ]' ; Dysuria '[ ]' ; Nocturia'[ ]'   Vascular: Pain in legs with walking '[ ]' ; Pain in feet with lying flat '[ ]' ; Non-healing sores '[ ]' ; Stroke '[ ]' ; TIA '[ ]' ; Slurred speech '[ ]' ;  Neuro: Headaches'[ ]' ; Vertigo'[ ]' ; Seizures'[ ]' ; Paresthesias'[ ]' ;Blurred vision '[ ]' ; Diplopia '[ ]' ; Vision changes '[ ]'   Ortho/Skin: Arthritis '[ ]' ; Joint pain '[ ]' ; Muscle pain '[ ]' ; Joint swelling '[ ]' ; Back Pain '[ ]' ; Rash '[ ]'   Psych: Depression[y ]; Anxiety[ y]  Heme: Bleeding problems '[ ]' ; Clotting disorders '[ ]' ; Anemia '[ ]'   Endocrine: Diabetes '[ ]' ; Thyroid dysfunction'[ ]'    Past Medical History:  Diagnosis Date   Anxiety    Depression    GERD (gastroesophageal reflux disease) OCCASIONALLY   TAKE TUMS   Human papilloma virus    Hyperlipidemia    Hypertension    UTI (urinary tract infection)    Vulvar lesion     Current Outpatient Medications  Medication Sig Dispense Refill   B Complex-C (B COMPLEX-VITAMIN C) CAPS Take 1 capsule by mouth daily.     buPROPion (WELLBUTRIN XL) 300 MG 24 hr tablet Take 300 mg by mouth every morning.     calcium carbonate (TUMS - DOSED IN MG ELEMENTAL CALCIUM) 500 MG chewable tablet Chew 2 tablets by mouth daily as needed for indigestion or heartburn.     cholecalciferol (VITAMIN D3) 25 MCG (1000 UNIT) tablet Take 1,000 Units by mouth daily.     clonazePAM (KLONOPIN) 1 MG tablet Take 1 mg by mouth at bedtime.     Cyanocobalamin (VITAMIN B-12) 5000 MCG TBDP Take 5,000 Units by mouth daily.     diphenhydrAMINE (BENADRYL) 25 MG tablet Take 25 mg by mouth in the morning.     fluticasone (FLONASE) 50 MCG/ACT nasal spray Place 1 spray into both nostrils daily as needed for allergies.     losartan (COZAAR) 25 MG tablet Take 50 mg by mouth daily.     meloxicam (MOBIC) 15 MG tablet Take 15 mg by mouth daily.     Potassium 99 MG TABS Take 99  mg by mouth at bedtime.     traZODone (DESYREL) 150 MG tablet Take 225 mg by mouth at bedtime.     venlafaxine XR (EFFEXOR-XR) 75 MG 24 hr capsule Take 75 mg by mouth daily with breakfast.     No current facility-administered medications for this encounter.    Allergies  Allergen Reactions   Gadolinium Derivatives Itching and Other (See Comments)    Redness and itching, skins feels like it was burning. In the future pt will need pre med.   Atorvastatin Calcium Other (See Comments)    Unknown per Pt     Azelaic Acid Other (See Comments)    Unknown per Pt    Oxybutynin Other (See Comments)    Unknown per Pt    Prozac [Fluoxetine Hcl] Other (See Comments)    "Could not lift head up or get out of bed"    Ritalin [Methylphenidate] Other (See Comments)    Unknown per Pt    Rosuvastatin Other (See Comments)     Unknown per Pt    Lyrica [Pregabalin] Anxiety and Other (See Comments)    "Felt weird"      Social History   Socioeconomic History   Marital status: Married    Spouse name: [name]   Number of children: 1   Years of education: 12   Highest education level: Not on file  Occupational History   Not on file  Tobacco Use   Smoking status: Never   Smokeless tobacco: Never  Vaping Use   Vaping Use: Never used  Substance and Sexual Activity   Alcohol use: Yes    Alcohol/week: 7.0 standard drinks of alcohol    Types: 7 Cans of beer per week   Drug use: Never   Sexual activity: Not Currently  Other Topics Concern   Not on file  Social History Narrative   Graduated HS      Right handed      Lives with husband   Social Determinants of Health   Financial Resource Strain: Low Risk  (02/22/2022)   Overall Financial Resource Strain (CARDIA)    Difficulty of Paying Living Expenses: Not hard at all  Food Insecurity: No Food Insecurity (02/22/2022)   Hunger Vital Sign    Worried About Running Out of Food in the Last Year: Never true    Ran Out of Food in the Last Year: Never true  Transportation Needs: No Transportation Needs (02/22/2022)   PRAPARE - Hydrologist (Medical): No    Lack of Transportation (Non-Medical): No  Physical Activity: Not on file  Stress: Not on file  Social Connections: Not on file  Intimate Partner Violence: Not on file      Family History  Problem Relation Age of Onset   Lung cancer Mother 41       she smoked   Prostate cancer Brother 13   Breast cancer Cousin        paternal first cousin   Breast cancer Cousin        paternal first cousin    Vitals:   07/11/22 0834  BP: 120/78  Pulse: 78  SpO2: 99%  Weight: 69.7 kg (153 lb 9.6 oz)    PHYSICAL EXAM: General:  Well appearing. No respiratory difficulty HEENT: normal Neck: supple. no JVD. Carotids 2+ bilat; no bruits. No lymphadenopathy or thryomegaly  appreciated. Cor: PMI nondisplaced. Regular rate & rhythm. No rubs, gallops or murmurs. Lungs: clear Abdomen: soft, nontender, nondistended. No  hepatosplenomegaly. No bruits or masses. Good bowel sounds. Extremities: no cyanosis, clubbing, rash, edema Neuro: alert & oriented x 3, cranial nerves grossly intact. moves all 4 extremities w/o difficulty. Affect pleasant.  ECG: sinus 74 narrow QRS No ST-T wave abnormalities. Personally reviewed   ASSESSMENT & PLAN:  1. Abnormal echocardiogram, potential herceptin cardiotoxicity - Echo 03/01/22: EF 60-65% G2DD GLs -18.6% - Echo 06/26/22: EF 55-60% GLS -14.1% - I have reviewed both her pre-chemo echocardiogram and her most recent echo side-by-side. Although EF on the most recent one may be slightly less vigorous than her pre-chemo echo it is still well within the normal range. Strain imaging is not ideal due to poor endocardial tracking on most recent echo but also suggests a possible slight decrease in contractile force - Although changes are minor it does fit the time course for potential herceptin cardiotoxicity. That said with her EF remaining normal would continue to treat for now. Would continue losartan and add low-dose beta-blocker (Toprol 25) for cardioprotection - Repeat echo in 2 months  - Explained incidence of [name] cardiotoxicity and role of Cardio-oncology clinic at length including role of scheduled interruptions as needed - D/w Dr. Lindi Adie  2. Left Breast Cancer - diagnosed 5/23. Stage 1A (ER/PR -, HER2+).  - s/p left mastectomy 7/23.  - s/p 8/23: Docetaxel + Trastuzumab + Pertuzumab (THP) q21d x 8 cycles / Trastuzumab + Pertuzumab q21d x 4 cycles  - s/pTrastuzumab IV (6) or SQ (600) D1 q21d X 11 Cycles   Allison Bickers, MD  8:40 AM

## 2022-07-12 ENCOUNTER — Inpatient Hospital Stay: Payer: Medicare Other | Attending: Hematology and Oncology

## 2022-07-12 ENCOUNTER — Inpatient Hospital Stay: Payer: Medicare Other

## 2022-07-12 ENCOUNTER — Other Ambulatory Visit: Payer: Self-pay

## 2022-07-12 ENCOUNTER — Ambulatory Visit: Payer: Medicare Other | Admitting: Internal Medicine

## 2022-07-12 ENCOUNTER — Inpatient Hospital Stay (HOSPITAL_BASED_OUTPATIENT_CLINIC_OR_DEPARTMENT_OTHER): Payer: Medicare Other | Admitting: Hematology and Oncology

## 2022-07-12 VITALS — BP 146/68 | HR 88 | Temp 97.5°F | Resp 18 | Ht 66.0 in | Wt 152.6 lb

## 2022-07-12 VITALS — BP 142/74 | HR 71 | Resp 18

## 2022-07-12 DIAGNOSIS — Z923 Personal history of irradiation: Secondary | ICD-10-CM | POA: Insufficient documentation

## 2022-07-12 DIAGNOSIS — C50412 Malignant neoplasm of upper-outer quadrant of left female breast: Secondary | ICD-10-CM

## 2022-07-12 DIAGNOSIS — Z171 Estrogen receptor negative status [ER-]: Secondary | ICD-10-CM | POA: Insufficient documentation

## 2022-07-12 DIAGNOSIS — Z9012 Acquired absence of left breast and nipple: Secondary | ICD-10-CM | POA: Insufficient documentation

## 2022-07-12 DIAGNOSIS — Z95828 Presence of other vascular implants and grafts: Secondary | ICD-10-CM

## 2022-07-12 DIAGNOSIS — Z5112 Encounter for antineoplastic immunotherapy: Secondary | ICD-10-CM | POA: Insufficient documentation

## 2022-07-12 LAB — CMP (CANCER CENTER ONLY)
ALT: 17 U/L (ref 0–44)
AST: 17 U/L (ref 15–41)
Albumin: 3.8 g/dL (ref 3.5–5.0)
Alkaline Phosphatase: 78 U/L (ref 38–126)
Anion gap: 6 (ref 5–15)
BUN: 16 mg/dL (ref 8–23)
CO2: 26 mmol/L (ref 22–32)
Calcium: 8.7 mg/dL — ABNORMAL LOW (ref 8.9–10.3)
Chloride: 106 mmol/L (ref 98–111)
Creatinine: 0.93 mg/dL (ref 0.44–1.00)
GFR, Estimated: >60 mL/min (ref >60)
Glucose, Bld: 135 mg/dL — ABNORMAL HIGH (ref 70–99)
Potassium: 3.6 mmol/L (ref 3.5–5.1)
Sodium: 138 mmol/L (ref 135–145)
Total Bilirubin: 0.5 mg/dL (ref 0.3–1.2)
Total Protein: 6 g/dL — ABNORMAL LOW (ref 6.5–8.1)

## 2022-07-12 LAB — CBC WITH DIFFERENTIAL (CANCER CENTER ONLY)
Abs Immature Granulocytes: 0.01 10*3/uL (ref 0.00–0.07)
Basophils Absolute: 0.1 10*3/uL (ref 0.0–0.1)
Basophils Relative: 1 %
Eosinophils Absolute: 0.2 10*3/uL (ref 0.0–0.5)
Eosinophils Relative: 3 %
HCT: 32.8 % — ABNORMAL LOW (ref 36.0–46.0)
Hemoglobin: 10.9 g/dL — ABNORMAL LOW (ref 12.0–15.0)
Immature Granulocytes: 0 %
Lymphocytes Relative: 17 %
Lymphs Abs: 1.2 10*3/uL (ref 0.7–4.0)
MCH: 27.7 pg (ref 26.0–34.0)
MCHC: 33.2 g/dL (ref 30.0–36.0)
MCV: 83.5 fL (ref 80.0–100.0)
Monocytes Absolute: 0.6 10*3/uL (ref 0.1–1.0)
Monocytes Relative: 9 %
Neutro Abs: 4.8 10*3/uL (ref 1.7–7.7)
Neutrophils Relative %: 70 %
Platelet Count: 317 10*3/uL (ref 150–400)
RBC: 3.93 MIL/uL (ref 3.87–5.11)
RDW: 12.8 % (ref 11.5–15.5)
WBC Count: 6.9 10*3/uL (ref 4.0–10.5)
nRBC: 0 % (ref 0.0–0.2)

## 2022-07-12 MED ORDER — DIPHENHYDRAMINE HCL 25 MG PO CAPS
25.0000 mg | ORAL_CAPSULE | Freq: Once | ORAL | Status: AC
  Administered 2022-07-12: 25 mg via ORAL
  Filled 2022-07-12: qty 1

## 2022-07-12 MED ORDER — ALTEPLASE 2 MG IJ SOLR: 2.0000 mg | Freq: Once | INTRAMUSCULAR | Status: DC | PRN

## 2022-07-12 MED ORDER — HEPARIN SOD (PORK) LOCK FLUSH 100 UNIT/ML IV SOLN: 250.0000 [IU] | Freq: Once | INTRAVENOUS | Status: DC | PRN

## 2022-07-12 MED ORDER — TRASTUZUMAB-DKST CHEMO 150 MG IV SOLR
6.0000 mg/kg | Freq: Once | INTRAVENOUS | Status: AC
  Administered 2022-07-12: 420 mg via INTRAVENOUS
  Filled 2022-07-12: qty 20

## 2022-07-12 MED ORDER — HEPARIN SOD (PORK) LOCK FLUSH 100 UNIT/ML IV SOLN
500.0000 [IU] | Freq: Once | INTRAVENOUS | Status: AC | PRN
  Administered 2022-07-12: 500 [IU]

## 2022-07-12 MED ORDER — SODIUM CHLORIDE 0.9% FLUSH
10.0000 mL | Freq: Once | INTRAVENOUS | Status: AC | PRN
  Administered 2022-07-12: 10 mL

## 2022-07-12 MED ORDER — SODIUM CHLORIDE 0.9% FLUSH
10.0000 mL | INTRAVENOUS | Status: DC | PRN
  Administered 2022-07-12: 10 mL

## 2022-07-12 MED ORDER — SODIUM CHLORIDE 0.9% FLUSH: 10.0000 mL | Freq: Once | INTRAVENOUS | Status: DC

## 2022-07-12 MED ORDER — HEPARIN SOD (PORK) LOCK FLUSH 100 UNIT/ML IV SOLN: 500.0000 [IU] | Freq: Once | INTRAVENOUS | Status: DC | PRN

## 2022-07-12 MED ORDER — SODIUM CHLORIDE 0.9 % IV SOLN: Freq: Once | INTRAVENOUS | Status: AC

## 2022-07-12 MED ORDER — SODIUM CHLORIDE 0.9 % IV SOLN
420.0000 mg | Freq: Once | INTRAVENOUS | Status: AC
  Administered 2022-07-12: 420 mg via INTRAVENOUS
  Filled 2022-07-12: qty 14

## 2022-07-12 MED ORDER — SODIUM CHLORIDE 0.9% FLUSH: 3.0000 mL | Freq: Once | INTRAVENOUS | Status: DC | PRN

## 2022-07-12 MED ORDER — ACETAMINOPHEN 325 MG PO TABS
650.0000 mg | ORAL_TABLET | Freq: Once | ORAL | Status: AC
  Administered 2022-07-12: 650 mg via ORAL
  Filled 2022-07-12: qty 2

## 2022-07-12 MED ORDER — SODIUM CHLORIDE 0.9 % IV SOLN: Freq: Once | INTRAVENOUS | Status: DC

## 2022-07-12 NOTE — Progress Notes (Signed)
Patient declined to stay for post perjeta monitoring. Patient ambulatory and VSS at discharge.

## 2022-07-12 NOTE — Patient Instructions (Signed)
[facility]COLOG[facility]  Discharge Instructions: Thank you for choosing [facility] to provide your oncology and hematology care.   If you have a lab appointment with the Cancer Center, please go directly to the Cancer Center and check in at the registration area.   Wear comfortable clothing and clothing appropriate for easy access to any Portacath or PICC line.   We strive to give you quality time with your provider. You may need to reschedule your appointment if you arrive late (15 or more minutes).  Arriving late affects you and other patients whose appointments are after yours.  Also, if you miss three or more appointments without notifying the office, you may be dismissed from the clinic at the provider's discretion.      For prescription refill requests, have your pharmacy contact our office and allow 72 hours for refills to be completed.    Today you received the following chemotherapy and/or immunotherapy agents Perjeta and Ogivri      To help prevent nausea and vomiting after your treatment, we encourage you to take your nausea medication as directed.  BELOW ARE SYMPTOMS THAT SHOULD BE REPORTED IMMEDIATELY: *FEVER GREATER THAN 100.4 F (38 C) OR HIGHER *CHILLS OR SWEATING *NAUSEA AND VOMITING THAT IS NOT CONTROLLED WITH YOUR NAUSEA MEDICATION *UNUSUAL SHORTNESS OF BREATH *UNUSUAL BRUISING OR BLEEDING *URINARY PROBLEMS (pain or burning when urinating, or frequent urination) *BOWEL PROBLEMS (unusual diarrhea, constipation, pain near the anus) TENDERNESS IN MOUTH AND THROAT WITH OR WITHOUT PRESENCE OF ULCERS (sore throat, sores in mouth, or a toothache) UNUSUAL RASH, SWELLING OR PAIN  UNUSUAL VAGINAL DISCHARGE OR ITCHING   Items with * indicate a potential emergency and should be followed up as soon as possible or go to the Emergency Department if any problems should occur.  Please show the CHEMOTHERAPY ALERT CARD or IMMUNOTHERAPY ALERT CARD at  check-in to the Emergency Department and triage nurse.  Should you have questions after your visit or need to cancel or reschedule your appointment, please contact [facility]  Dept: [phone]  and follow the prompts.  Office hours are 8:00 a.m. to 4:30 p.m. Monday - Friday. Please note that voicemails left after 4:00 p.m. may not be returned until the following business day.  We are closed weekends and major holidays. You have access to a nurse at all times for urgent questions. Please call the main number to the clinic Dept: [phone] and follow the prompts.   For any non-urgent questions, you may also contact your provider using MyChart. We now offer e-Visits for anyone 18 and older to request care online for non-urgent symptoms. For details visit [email or web address].   Also download the MyChart app! Go to the app store, search "MyChart", open the app, select [facility], and log in with your MyChart username and password.  Masks are optional in the cancer centers. If you would like for your care team to wear a mask while they are taking care of you, please let them know. You may have one support person who is at least 77 years old accompany you for your appointments. 

## 2022-07-12 NOTE — Assessment & Plan Note (Addendum)
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 T4 vertebral body Bone scan 04/25/2022: Abnormal uptake in the sternum, right parietal region of calvarium, right posterior seventh rib, T5, left sternoclavicular joint  Treatment: Palliative chemotherapy with Taxotere [name] and Perjeta every 3 weeksx1 cycle given 05/05/2022 (discontinued for severe toxicities and hospitalization)  ---------------------------------------------------------------------------------------------------------------------------------------------- Chemotoxicities: Hospitalization 05/11/2022-05/18/2022: Convulsive syncope, chemo induced neutropenia, oral mucositis, chronic CHF, severe diarrhea with dehydration ECHO 06/26/22: EF 55-60% (follows with Bensim[facility])  Treatment plan:8/25/23[name]. added Perjeta 06/21/22.  Severe constipation:Encouraged her to use Dulcolax as needed for constipation.  Return to clinic every 3 weeks for HP and every 6 weeks for follow up with me.

## 2022-07-13 ENCOUNTER — Other Ambulatory Visit: Payer: Self-pay

## 2022-07-14 DIAGNOSIS — T451X5D Adverse effect of antineoplastic and immunosuppressive drugs, subsequent encounter: Secondary | ICD-10-CM | POA: Diagnosis not present

## 2022-07-14 DIAGNOSIS — C50912 Malignant neoplasm of unspecified site of left female breast: Secondary | ICD-10-CM | POA: Diagnosis not present

## 2022-07-14 DIAGNOSIS — C7951 Secondary malignant neoplasm of bone: Secondary | ICD-10-CM | POA: Diagnosis not present

## 2022-07-14 DIAGNOSIS — B029 Zoster without complications: Secondary | ICD-10-CM | POA: Diagnosis not present

## 2022-07-14 DIAGNOSIS — I110 Hypertensive heart disease with heart failure: Secondary | ICD-10-CM | POA: Diagnosis not present

## 2022-07-14 DIAGNOSIS — D701 Agranulocytosis secondary to cancer chemotherapy: Secondary | ICD-10-CM | POA: Diagnosis not present

## 2022-07-26 ENCOUNTER — Encounter: Payer: Self-pay | Admitting: Hematology and Oncology

## 2022-07-27 ENCOUNTER — Encounter (HOSPITAL_COMMUNITY)
Admission: RE | Admit: 2022-07-27 | Discharge: 2022-07-27 | Disposition: A | Discharge status: Home / Self Care | Payer: Medicare Other | Source: Ambulatory Visit | Attending: Hematology and Oncology | Admitting: Hematology and Oncology

## 2022-07-27 DIAGNOSIS — Z171 Estrogen receptor negative status [ER-]: Secondary | ICD-10-CM | POA: Insufficient documentation

## 2022-07-27 DIAGNOSIS — C50412 Malignant neoplasm of upper-outer quadrant of left female breast: Secondary | ICD-10-CM | POA: Insufficient documentation

## 2022-07-27 DIAGNOSIS — C50919 Malignant neoplasm of unspecified site of unspecified female breast: Secondary | ICD-10-CM | POA: Diagnosis not present

## 2022-07-27 DIAGNOSIS — C7951 Secondary malignant neoplasm of bone: Secondary | ICD-10-CM | POA: Diagnosis not present

## 2022-07-27 MED ORDER — TECHNETIUM TC 99M MEDRONATE IV KIT
20.0000 | PACK | Freq: Once | INTRAVENOUS | Status: AC | PRN
  Administered 2022-07-27: 19.9 via INTRAVENOUS

## 2022-07-29 NOTE — Progress Notes (Signed)
Patient Care Team: [name], MD as PCP - General (Family Medicine) Cameron Sprang, MD as Consulting Physician (Neurology) Mauro Kaufmann, RN as Oncology Nurse Navigator Rockwell Germany, RN as Oncology Nurse Navigator Coralie Keens, MD as Consulting Physician (General Surgery) Nicholas Lose, MD as Consulting Physician (Hematology and Oncology) Eppie Gibson, MD as Attending Physician (Radiation Oncology)  DIAGNOSIS: No diagnosis found.  SUMMARY OF ONCOLOGIC HISTORY: Oncology History  Malignant neoplasm of upper-outer quadrant of left breast in female, estrogen receptor negative ([place])  02/10/2022 Initial Diagnosis   Palpable left breast masses and calcifications: 1.8 cm at 1:00 and 1.7 cm at 9:00, calcifications not biopsy.  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 Report date is 03/05/2022.  The CustomNext-Cancer+RNAinsight panel offered by [facility] includes sequencing and rearrangement analysis for the following 48 genes:  APC, ATM, AXIN2, BARD1, BMPR1A, BRCA1, BRCA2, BRIP1, CDH1, CDK4, CDKN2A, CHEK2, CTNNA1, DICER1, EGFR, EPCAM, GREM1, HOXB13, KIT, MEN1, MLH1, MSH2, MSH3, MSH6, MUTYH, NBN, NF1, NTHL1, PALB2, PDGFRA, PMS2, POLD1, POLE, PTEN, RAD50, RAD51C, RAD51D, SDHA, SDHB, SDHC, SDHD, SMAD4, SMARCA4, STK11, TP53, TSC1, TSC2, and VHL.  RNA data is routinely analyzed for use in variant interpretation for all genes.   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, LCIS, 5/5 lymph nodes positive with extranodal extension, margins  negative ER 0%, PR 0%, HER2 3+, Ki-67 20%   04/27/2022 Cancer Staging   Staging form: Breast, AJCC 8th Edition - Pathologic: Stage IIIA (pT2, pN2, cM0, G2, ER-, PR-, HER2+) - Signed by Nicholas Lose, MD on 04/27/2022 Histologic grading system: 3 grade system   05/05/2022 - 05/26/2022 Chemotherapy   Patient is on Treatment Plan : BREAST DOCEtaxel + Trastuzumab + Pertuzumab (THP) q21d x 8 cycles / Trastuzumab + Pertuzumab q21d x 4 cycles     05/26/2022 -  Chemotherapy   Patient is on Treatment Plan : BREAST MAINTENANCE Trastuzumab IV (6) or SQ (600) D1 q21d X 11 Cycles       CHIEF COMPLIANT: Follow-up metastatic breast cancer  INTERVAL HISTORY: Allison Pierce is a 76 y.o with the above mention metastatic breast cancer currently on herceptin perjeta. She presents to the clinic today for a follow-up.   ALLERGIES:  is allergic to gadolinium derivatives, atorvastatin calcium, azelaic acid, oxybutynin, prozac [fluoxetine hcl], ritalin [methylphenidate], rosuvastatin, and lyrica [pregabalin].  MEDICATIONS:  Current Outpatient Medications  Medication Sig Dispense Refill   B Complex-C (B COMPLEX-VITAMIN C) CAPS Take 1 capsule by mouth daily.     buPROPion (WELLBUTRIN XL) 300 MG 24 hr tablet Take 300 mg by mouth every morning.     calcium carbonate (TUMS - DOSED IN MG ELEMENTAL CALCIUM) 500 MG chewable tablet Chew 2 tablets by mouth daily as needed for indigestion or heartburn.     cholecalciferol (VITAMIN D3) 25 MCG (1000 UNIT) tablet Take 1,000 Units by mouth daily.     clonazePAM (KLONOPIN) 1 MG tablet Take 1 mg by mouth at bedtime.     Cyanocobalamin (VITAMIN B-12) 5000 MCG TBDP Take 5,000 Units by mouth daily.  diphenhydrAMINE (BENADRYL) 25 MG tablet Take 25 mg by mouth in the morning.     fluticasone (FLONASE) 50 MCG/ACT nasal spray Place 1 spray into both nostrils daily as needed for allergies.     losartan (COZAAR) 25 MG tablet Take 50 mg by mouth daily.     meloxicam (MOBIC) 15  MG tablet Take 15 mg by mouth daily.     metoprolol succinate (TOPROL XL) 25 MG 24 hr tablet Take 1 tablet (25 mg total) by mouth daily. 90 tablet 3   Potassium 99 MG TABS Take 99 mg by mouth at bedtime.     traZODone (DESYREL) 150 MG tablet Take 225 mg by mouth at bedtime.     venlafaxine XR (EFFEXOR-XR) 75 MG 24 hr capsule Take 75 mg by mouth daily with breakfast.     No current facility-administered medications for this visit.    PHYSICAL EXAMINATION: ECOG PERFORMANCE STATUS: {CHL ONC ECOG PS:[phone]}  There were no vitals filed for this visit. There were no vitals filed for this visit.  BREAST:*** No palpable masses or nodules in either right or left breasts. No palpable axillary supraclavicular or infraclavicular adenopathy no breast tenderness or nipple discharge. (exam performed in the presence of a chaperone)  LABORATORY DATA:  I have reviewed the data as listed    Latest Ref Rng & Units 07/12/2022    8:05 AM 06/21/2022   11:41 AM 06/05/2022   10:21 AM  CMP  Glucose 70 - 99 mg/dL 135  115  120   BUN 8 - 23 mg/dL _0 Creatinine 0.44 - 1.00 mg/dL 0.93  0.85  0.77   Sodium 135 - 145 mmol/L 138  139  137   Potassium 3.5 - 5.1 mmol/L 3.6  4.1  3.6   Chloride 98 - 111 mmol/L 106  107  107   CO2 22 - 32 mmol/L _1 Calcium 8.9 - 10.3 mg/dL 8.7  8.6  8.7   Total Protein 6.5 - 8.1 g/dL 6.0  5.7  6.1   Total Bilirubin 0.3 - 1.2 mg/dL 0.5  0.4  0.5   Alkaline Phos 38 - 126 U/L 78  79  76   AST 15 - 41 U/L _2 ALT 0 - 44 U/L _3 Lab Results  Component Value Date   WBC 6.9 07/12/2022   HGB 10.9 (L) 07/12/2022   HCT 32.8 (L) 07/12/2022   MCV 83.5 07/12/2022   PLT 317 07/12/2022   NEUTROABS 4.8 07/12/2022    ASSESSMENT & PLAN:  No problem-specific Assessment & Plan notes found for this encounter.    No orders of the defined types were placed in this encounter.  The patient has a good understanding of the overall plan. she agrees  with it. she will call with any problems that may develop before the next visit here. Total time spent: 30 mins including face to face time and time spent for planning, charting and co-ordination of care   Suzzette Righter, [place] 07/29/22    I Gardiner Coins am scribing for Dr. Lindi Adie  ***

## 2022-07-31 ENCOUNTER — Ambulatory Visit: Payer: Medicare Other | Admitting: Internal Medicine

## 2022-08-01 DIAGNOSIS — C50912 Malignant neoplasm of unspecified site of left female breast: Secondary | ICD-10-CM | POA: Diagnosis not present

## 2022-08-02 ENCOUNTER — Inpatient Hospital Stay: Payer: Medicare Other | Attending: Hematology and Oncology

## 2022-08-02 ENCOUNTER — Encounter: Payer: Self-pay | Admitting: *Deleted

## 2022-08-02 ENCOUNTER — Telehealth: Payer: Self-pay | Admitting: Physician Assistant

## 2022-08-02 ENCOUNTER — Inpatient Hospital Stay (HOSPITAL_BASED_OUTPATIENT_CLINIC_OR_DEPARTMENT_OTHER): Payer: Medicare Other | Admitting: Hematology and Oncology

## 2022-08-02 ENCOUNTER — Ambulatory Visit: Payer: Medicare Other | Admitting: Physician Assistant

## 2022-08-02 ENCOUNTER — Inpatient Hospital Stay: Payer: Medicare Other

## 2022-08-02 ENCOUNTER — Other Ambulatory Visit: Payer: Self-pay

## 2022-08-02 ENCOUNTER — Telehealth: Payer: Self-pay | Admitting: Plastic Surgery

## 2022-08-02 VITALS — BP 172/75 | HR 67 | Temp 97.6°F | Resp 18

## 2022-08-02 DIAGNOSIS — R197 Diarrhea, unspecified: Secondary | ICD-10-CM | POA: Insufficient documentation

## 2022-08-02 DIAGNOSIS — Z171 Estrogen receptor negative status [ER-]: Secondary | ICD-10-CM | POA: Insufficient documentation

## 2022-08-02 DIAGNOSIS — C50412 Malignant neoplasm of upper-outer quadrant of left female breast: Secondary | ICD-10-CM

## 2022-08-02 DIAGNOSIS — Z95828 Presence of other vascular implants and grafts: Secondary | ICD-10-CM

## 2022-08-02 DIAGNOSIS — Z923 Personal history of irradiation: Secondary | ICD-10-CM | POA: Diagnosis not present

## 2022-08-02 DIAGNOSIS — Z5112 Encounter for antineoplastic immunotherapy: Secondary | ICD-10-CM | POA: Diagnosis not present

## 2022-08-02 DIAGNOSIS — Z9012 Acquired absence of left breast and nipple: Secondary | ICD-10-CM | POA: Insufficient documentation

## 2022-08-02 LAB — CMP (CANCER CENTER ONLY)
ALT: 15 U/L (ref 0–44)
AST: 19 U/L (ref 15–41)
Albumin: 3.6 g/dL (ref 3.5–5.0)
Alkaline Phosphatase: 73 U/L (ref 38–126)
Anion gap: 4 — ABNORMAL LOW (ref 5–15)
BUN: 11 mg/dL (ref 8–23)
CO2: 28 mmol/L (ref 22–32)
Calcium: 8.5 mg/dL — ABNORMAL LOW (ref 8.9–10.3)
Chloride: 106 mmol/L (ref 98–111)
Creatinine: 0.93 mg/dL (ref 0.44–1.00)
GFR, Estimated: >60 mL/min (ref >60)
Glucose, Bld: 90 mg/dL (ref 70–99)
Potassium: 4 mmol/L (ref 3.5–5.1)
Sodium: 138 mmol/L (ref 135–145)
Total Bilirubin: 0.4 mg/dL (ref 0.3–1.2)
Total Protein: 5.8 g/dL — ABNORMAL LOW (ref 6.5–8.1)

## 2022-08-02 LAB — CBC WITH DIFFERENTIAL (CANCER CENTER ONLY)
Abs Immature Granulocytes: 0.01 10*3/uL (ref 0.00–0.07)
Basophils Absolute: 0.1 10*3/uL (ref 0.0–0.1)
Basophils Relative: 1 %
Eosinophils Absolute: 0.2 10*3/uL (ref 0.0–0.5)
Eosinophils Relative: 4 %
HCT: 31 % — ABNORMAL LOW (ref 36.0–46.0)
Hemoglobin: 10.4 g/dL — ABNORMAL LOW (ref 12.0–15.0)
Immature Granulocytes: 0 %
Lymphocytes Relative: 18 %
Lymphs Abs: 1 10*3/uL (ref 0.7–4.0)
MCH: 28 pg (ref 26.0–34.0)
MCHC: 33.5 g/dL (ref 30.0–36.0)
MCV: 83.6 fL (ref 80.0–100.0)
Monocytes Absolute: 0.5 10*3/uL (ref 0.1–1.0)
Monocytes Relative: 9 %
Neutro Abs: 3.7 10*3/uL (ref 1.7–7.7)
Neutrophils Relative %: 68 %
Platelet Count: 273 10*3/uL (ref 150–400)
RBC: 3.71 MIL/uL — ABNORMAL LOW (ref 3.87–5.11)
RDW: 12.8 % (ref 11.5–15.5)
WBC Count: 5.6 10*3/uL (ref 4.0–10.5)
nRBC: 0 % (ref 0.0–0.2)

## 2022-08-02 MED ORDER — SODIUM CHLORIDE 0.9% FLUSH
10.0000 mL | Freq: Once | INTRAVENOUS | Status: AC
  Administered 2022-08-02: 10 mL

## 2022-08-02 MED ORDER — SODIUM CHLORIDE 0.9 % IV SOLN: Freq: Once | INTRAVENOUS | Status: AC

## 2022-08-02 MED ORDER — ACETAMINOPHEN 325 MG PO TABS
650.0000 mg | ORAL_TABLET | Freq: Once | ORAL | Status: AC
  Administered 2022-08-02: 650 mg via ORAL
  Filled 2022-08-02: qty 2

## 2022-08-02 MED ORDER — DIPHENHYDRAMINE HCL 25 MG PO CAPS
25.0000 mg | ORAL_CAPSULE | Freq: Once | ORAL | Status: AC
  Administered 2022-08-02: 25 mg via ORAL
  Filled 2022-08-02: qty 1

## 2022-08-02 MED ORDER — SODIUM CHLORIDE 0.9% FLUSH
10.0000 mL | INTRAVENOUS | Status: DC | PRN
  Administered 2022-08-02: 10 mL

## 2022-08-02 MED ORDER — HEPARIN SOD (PORK) LOCK FLUSH 100 UNIT/ML IV SOLN
500.0000 [IU] | Freq: Once | INTRAVENOUS | Status: AC | PRN
  Administered 2022-08-02: 500 [IU]

## 2022-08-02 MED ORDER — SODIUM CHLORIDE 0.9 % IV SOLN
420.0000 mg | Freq: Once | INTRAVENOUS | Status: AC
  Administered 2022-08-02: 420 mg via INTRAVENOUS
  Filled 2022-08-02: qty 14

## 2022-08-02 MED ORDER — TRASTUZUMAB-DKST CHEMO 150 MG IV SOLR
6.0000 mg/kg | Freq: Once | INTRAVENOUS | Status: AC
  Administered 2022-08-02: 420 mg via INTRAVENOUS
  Filled 2022-08-02: qty 20

## 2022-08-02 NOTE — Patient Instructions (Signed)
[facility]  Discharge Instructions: Thank you for choosing [facility] to provide your oncology and hematology care.   If you have a lab appointment with the [facility], please go directly to the [facility] and check in at the registration area.   Wear comfortable clothing and clothing appropriate for easy access to any Portacath or PICC line.   We strive to give you quality time with your provider. You may need to reschedule your appointment if you arrive late (15 or more minutes).  Arriving late affects you and other patients whose appointments are after yours.  Also, if you miss three or more appointments without notifying the office, you may be dismissed from the clinic at the provider's discretion.      For prescription refill requests, have your pharmacy contact our office and allow 72 hours for refills to be completed.    Today you received the following chemotherapy and/or immunotherapy agents Perjeta and Ogivri      To help prevent nausea and vomiting after your treatment, we encourage you to take your nausea medication as directed.  BELOW ARE SYMPTOMS THAT SHOULD BE REPORTED IMMEDIATELY: *FEVER GREATER THAN 100.4 F (38 C) OR HIGHER *CHILLS OR SWEATING *NAUSEA AND VOMITING THAT IS NOT CONTROLLED WITH YOUR NAUSEA MEDICATION *UNUSUAL SHORTNESS OF BREATH *UNUSUAL BRUISING OR BLEEDING *URINARY PROBLEMS (pain or burning when urinating, or frequent urination) *BOWEL PROBLEMS (unusual diarrhea, constipation, pain near the anus) TENDERNESS IN MOUTH AND THROAT WITH OR WITHOUT PRESENCE OF ULCERS (sore throat, sores in mouth, or a toothache) UNUSUAL RASH, SWELLING OR PAIN  UNUSUAL VAGINAL DISCHARGE OR ITCHING   Items with * indicate a potential emergency and should be followed up as soon as possible or go to the Emergency Department if any problems should occur.  Please show the CHEMOTHERAPY ALERT CARD or IMMUNOTHERAPY ALERT CARD at  check-in to the Emergency Department and triage nurse.  Should you have questions after your visit or need to cancel or reschedule your appointment, please contact [facility]  Dept: [phone]  and follow the prompts.  Office hours are 8:00 a.m. to 4:30 p.m. Monday - Friday. Please note that voicemails left after 4:00 p.m. may not be returned until the following business day.  We are closed weekends and major holidays. You have access to a nurse at all times for urgent questions. Please call the main number to the clinic Dept: [phone] and follow the prompts.   For any non-urgent questions, you may also contact your provider using MyChart. We now offer e-Visits for anyone 5 and older to request care online for non-urgent symptoms. For details visit [email or web address].   Also download the MyChart app! Go to the app store, search "MyChart", open the app, select [facility], and log in with your MyChart username and password.  Masks are optional in the cancer centers. If you would like for your care team to wear a mask while they are taking care of you, please let them know. You may have one support person who is at least 77 years old accompany you for your appointments.

## 2022-08-02 NOTE — Assessment & Plan Note (Signed)
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 T4 vertebral body Bone scan 04/25/2022: Abnormal uptake in the sternum, right parietal region of calvarium, right posterior seventh rib, T5, left sternoclavicular joint  Treatment: Palliative chemotherapy with Taxotere [name] and Perjeta every 3 weeksx1 cycle given 05/05/2022 (discontinued for severe toxicities and hospitalization)  ---------------------------------------------------------------------------------------------------------------------------------------------- Chemotoxicities: Hospitalization 05/11/2022-05/18/2022: Convulsive syncope, chemo induced neutropenia, oral mucositis, chronic CHF, severe diarrhea with dehydration ECHO 06/26/22: EF 55-60% (follows with [facility])  Treatment plan:8/25/23[name]. added Perjeta 06/21/22.  Severe constipation: Normal bowel movements  Bone scan 07/30/2022: Interval resolution and or decreasing radiotracer accumulation within the multiple bone metastases showing response to treatment. Based on the above results we will continue with [name] and Perjeta.  There is no need to add chemotherapy.  Return to clinic every 3 weeks for HP and every 6 weeks for follow up with me.

## 2022-08-02 NOTE — Telephone Encounter (Signed)
I spoke with [name] today.  She did have a scheduled appointment but was running late with oncology.  Given the nature of her appointment I felt that I could just speak with her over the phone to help guide her for her follow-up evaluation.  In brief the patient is a 76 year old female who is status post left breast reconstruction and placement of tissue expanders and Flex HD by Dr. Claudia Desanctis after left total mastectomy and deep left axillary sentinel lymph node biopsy by Dr. Ninfa Linden on 04/07/2022.  The patient had a [place] high-profile expander placed.  250 cc of saline was placed within the expander intraoperatively.  She was last seen in our office on 04/21/2022.  At that time she had been doing very well without significant complaints or concerns.  She did have injectable saline placed in the expander totaling 375/375 cc.  The patient is currently under the care of Dr. Lindi Adie of oncology, she was seen today by him.  He relayed on to her that given she is not on chemotherapy that she may proceed with surgery.  She is currently on herceptin perjeta.   I spoke with the patient on the phone, we will schedule her a office evaluation with with Dr. Lovena Le for surgical planning.  She denied any complaints related to her expanders other than discomfort which has been there.  She will reach out to [place] with any further questions or concerns.  Our office will call to schedule the follow-up evaluation.

## 2022-08-02 NOTE — Telephone Encounter (Signed)
Spoke with pt about setting appt with Dr. Lovena Le, discussed times and I will call her back tomorrow so she can figure out the best time.

## 2022-08-04 ENCOUNTER — Ambulatory Visit (INDEPENDENT_AMBULATORY_CARE_PROVIDER_SITE_OTHER): Payer: Medicare Other | Admitting: Plastic Surgery

## 2022-08-04 VITALS — BP 153/76 | HR 87 | Ht 66.0 in | Wt 149.8 lb

## 2022-08-04 DIAGNOSIS — Z9889 Other specified postprocedural states: Secondary | ICD-10-CM

## 2022-08-04 DIAGNOSIS — C50412 Malignant neoplasm of upper-outer quadrant of left female breast: Secondary | ICD-10-CM | POA: Diagnosis not present

## 2022-08-04 DIAGNOSIS — Z171 Estrogen receptor negative status [ER-]: Secondary | ICD-10-CM

## 2022-08-07 ENCOUNTER — Telehealth: Payer: Self-pay

## 2022-08-07 ENCOUNTER — Encounter: Payer: Self-pay | Admitting: Plastic Surgery

## 2022-08-07 NOTE — Progress Notes (Signed)
Referring Provider [name], MD [facility] [address] [employer] [address] [place],  [place] [address]   CC:  Chief Complaint  Patient presents with   Advice Only      Allison Pierce is an 76 y.o. female.  HPI: Allison Pierce returns today for follow-up.  She had a 375 cc [place] tissue expander placed after a left-sided mastectomy.  The tissue expander still in place and she is still undergoing chemotherapy.  She stated today that her disease is more widespread than initially thought.  She may require additional chemotherapy and potentially radiation therapy as well  Allergies  Allergen Reactions   Gadolinium Derivatives Itching and Other (See Comments)    Redness and itching, skins feels like it was burning. In the future pt will need pre med.   Atorvastatin Calcium Other (See Comments)    Unknown per Pt     Azelaic Acid Other (See Comments)    Unknown per Pt    Oxybutynin Other (See Comments)    Unknown per Pt    Prozac [Fluoxetine Hcl] Other (See Comments)    "Could not lift head up or get out of bed"    Ritalin [Methylphenidate] Other (See Comments)    Unknown per Pt    Rosuvastatin Other (See Comments)    Unknown per Pt    Lyrica [Pregabalin] Anxiety and Other (See Comments)    "Felt weird"    Outpatient Encounter Medications as of 08/04/2022  Medication Sig   B Complex-C (B COMPLEX-VITAMIN C) CAPS Take 1 capsule by mouth daily.   buPROPion (WELLBUTRIN XL) 300 MG 24 hr tablet Take 300 mg by mouth every morning.   calcium carbonate (TUMS - DOSED IN MG ELEMENTAL CALCIUM) 500 MG chewable tablet Chew 2 tablets by mouth daily as needed for indigestion or heartburn.   cholecalciferol (VITAMIN D3) 25 MCG (1000 UNIT) tablet Take 1,000 Units by mouth daily.   clonazePAM (KLONOPIN) 1 MG tablet Take 1 mg by mouth at bedtime.   Cyanocobalamin (VITAMIN B-12) 5000 MCG TBDP Take 5,000 Units by mouth daily.   diphenhydrAMINE (BENADRYL) 25 MG tablet Take 25 mg by mouth in the morning.    fluticasone (FLONASE) 50 MCG/ACT nasal spray Place 1 spray into both nostrils daily as needed for allergies.   losartan (COZAAR) 25 MG tablet Take 50 mg by mouth daily.   meloxicam (MOBIC) 15 MG tablet Take 15 mg by mouth daily.   metoprolol succinate (TOPROL XL) 25 MG 24 hr tablet Take 1 tablet (25 mg total) by mouth daily.   Potassium 99 MG TABS Take 99 mg by mouth at bedtime.   traZODone (DESYREL) 150 MG tablet Take 225 mg by mouth at bedtime.   venlafaxine XR (EFFEXOR-XR) 75 MG 24 hr capsule Take 75 mg by mouth daily with breakfast.   [DISCONTINUED] prochlorperazine (COMPAZINE) 10 MG tablet Take 1 tablet (10 mg total) by mouth every 6 (six) hours as needed (Nausea or vomiting). (Patient not taking: Reported on 05/12/2022)   No facility-administered encounter medications on file as of 08/04/2022.     Past Medical History:  Diagnosis Date   Anxiety    Depression    GERD (gastroesophageal reflux disease) OCCASIONALLY  TAKE TUMS   Human papilloma virus    Hyperlipidemia    Hypertension    UTI (urinary tract infection)    Vulvar lesion     Past Surgical History:  Procedure Laterality Date   ABDOMINAL HYSTERECTOMY  1992   W/ SALPINGO-OOPHORECTOMY  ANTERIOR CERVICAL DECOMP/DISCECTOMY FUSION  01-18-2009  DR POOLE   C4  - C6   AUGMENTATION MAMMAPLASTY Bilateral    BREAST BIOPSY Right    BREAST BIOPSY Left 02/10/2022   x2   BREAST ENHANCEMENT SURGERY  2011   BREAST EXCISIONAL BIOPSY Left    BREAST RECONSTRUCTION WITH PLACEMENT OF TISSUE EXPANDER AND FLEX HD (ACELLULAR HYDRATED DERMIS) Left 04/07/2022   Procedure: BREAST RECONSTRUCTION WITH PLACEMENT OF TISSUE EXPANDER AND FLEX HD (ACELLULAR HYDRATED DERMIS);  Surgeon: [name], MD;  Location: [facility];  Service: Plastics;  Laterality: Left;   BREAST SURGERY  02-04-2011  dr Margot Chimes   EXCISION LEFT BREAST MASS--  CALCIFICATION   EYE SURGERY Bilateral    cataract removal   MASTECTOMY W/ SENTINEL NODE BIOPSY Left 04/07/2022    Procedure: LEFT MASTECTOMY WITH SENTINEL NODE BIOPSY;  Surgeon: Coralie Keens, MD;  Location: [facility];  Service: General;  Laterality: Left;  LMA   PORTACATH PLACEMENT N/A 04/07/2022   Procedure: PORT-A-CATH INSERTION WITH ULTRASOUND GUIDANCE;  Surgeon: Coralie Keens, MD;  Location: [facility];  Service: General;  Laterality: N/A;   VULVAR LESION REMOVAL  09/10/2012   Procedure: VULVAR LESION;  Surgeon: Selinda Orion, MD;  [facility];  Service: Gynecology;  Laterality: N/A;  WIDE EXCISION OF VULVAR LESION    Family History  Problem Relation Age of Onset   Lung cancer Mother 7       she smoked   Prostate cancer Brother 47   Breast cancer Cousin        paternal first cousin   Breast cancer Cousin        paternal first cousin    Social History   Social History Narrative   Graduated HS      Right handed      Lives with husband     Review of Systems General: Denies fevers, chills, weight loss CV: Denies chest pain, shortness of breath, palpitations Breast: She notes occasional left-sided chest pain due to her tissue expander.  Physical Exam    08/04/2022    2:06 PM 08/02/2022   12:10 PM 08/02/2022    9:32 AM  Vitals with BMI  Height '5\' 6"'$     Weight 149 lbs 13 oz    BMI 95.28    Systolic 413 244 010  Diastolic 76 75 78  Pulse 87 67     General:  No acute distress,  Alert and oriented, Non-Toxic, Normal speech and affect Breast: Tissue expanders in place the incision is well-healed there are no issues with the left breast. Mammogram: Not applicable Assessment/Plan Postmastectomy, tissue expander left side: I had a long discussion today with Allison Pierce and her husband.  We will continue on with the prior plans.  We will wait until she is finished with chemotherapy prior to exchanging her tissue expander for her implant.  We did discuss that the 2 issues which may change this plan are 1 if she does have radiation therapy I would like to exchange her  tissue expander for her permanent implant first and 2 if she continues to have left-sided chest pain.  She did ask if she could just have the tissue expander and not exchanged for an implant.  This is of course possible however I feel that she would be more comfortable with a permanent implant.  She will return to see me in clinic.  Camillia Herter 08/07/2022, 7:45 AM

## 2022-08-07 NOTE — Telephone Encounter (Signed)
Pt called and LVM asking when next treatment was as it was not on her schedule. Returned pt call to advise she will be due to have tx week of 11/22. Left detailed message to identified VM.

## 2022-08-08 ENCOUNTER — Telehealth: Payer: Self-pay | Admitting: Hematology and Oncology

## 2022-08-08 NOTE — Telephone Encounter (Signed)
Scheduled appointment per WQ. Patient is aware. 

## 2022-08-10 ENCOUNTER — Other Ambulatory Visit: Payer: Self-pay

## 2022-08-21 ENCOUNTER — Other Ambulatory Visit: Payer: Self-pay

## 2022-08-23 ENCOUNTER — Inpatient Hospital Stay: Payer: Medicare Other

## 2022-08-23 ENCOUNTER — Other Ambulatory Visit: Payer: Self-pay

## 2022-08-23 VITALS — BP 134/81 | HR 73 | Temp 97.8°F | Resp 16 | Wt 149.2 lb

## 2022-08-23 DIAGNOSIS — Z9012 Acquired absence of left breast and nipple: Secondary | ICD-10-CM | POA: Diagnosis not present

## 2022-08-23 DIAGNOSIS — R197 Diarrhea, unspecified: Secondary | ICD-10-CM | POA: Diagnosis not present

## 2022-08-23 DIAGNOSIS — Z5112 Encounter for antineoplastic immunotherapy: Secondary | ICD-10-CM | POA: Diagnosis not present

## 2022-08-23 DIAGNOSIS — C50412 Malignant neoplasm of upper-outer quadrant of left female breast: Secondary | ICD-10-CM | POA: Diagnosis not present

## 2022-08-23 DIAGNOSIS — Z95828 Presence of other vascular implants and grafts: Secondary | ICD-10-CM

## 2022-08-23 DIAGNOSIS — Z171 Estrogen receptor negative status [ER-]: Secondary | ICD-10-CM | POA: Diagnosis not present

## 2022-08-23 DIAGNOSIS — Z923 Personal history of irradiation: Secondary | ICD-10-CM | POA: Diagnosis not present

## 2022-08-23 LAB — CMP (CANCER CENTER ONLY)
ALT: 13 U/L (ref 0–44)
AST: 16 U/L (ref 15–41)
Albumin: 3.8 g/dL (ref 3.5–5.0)
Alkaline Phosphatase: 76 U/L (ref 38–126)
Anion gap: 6 (ref 5–15)
BUN: 12 mg/dL (ref 8–23)
CO2: 26 mmol/L (ref 22–32)
Calcium: 9 mg/dL (ref 8.9–10.3)
Chloride: 107 mmol/L (ref 98–111)
Creatinine: 1.01 mg/dL — ABNORMAL HIGH (ref 0.44–1.00)
GFR, Estimated: 57 mL/min — ABNORMAL LOW (ref >60)
Glucose, Bld: 113 mg/dL — ABNORMAL HIGH (ref 70–99)
Potassium: 3.8 mmol/L (ref 3.5–5.1)
Sodium: 139 mmol/L (ref 135–145)
Total Bilirubin: 0.4 mg/dL (ref 0.3–1.2)
Total Protein: 5.9 g/dL — ABNORMAL LOW (ref 6.5–8.1)

## 2022-08-23 LAB — CBC WITH DIFFERENTIAL (CANCER CENTER ONLY)
Abs Immature Granulocytes: 0.02 10*3/uL (ref 0.00–0.07)
Basophils Absolute: 0.1 10*3/uL (ref 0.0–0.1)
Basophils Relative: 1 %
Eosinophils Absolute: 0.2 10*3/uL (ref 0.0–0.5)
Eosinophils Relative: 4 %
HCT: 33.9 % — ABNORMAL LOW (ref 36.0–46.0)
Hemoglobin: 10.9 g/dL — ABNORMAL LOW (ref 12.0–15.0)
Immature Granulocytes: 0 %
Lymphocytes Relative: 20 %
Lymphs Abs: 1.2 10*3/uL (ref 0.7–4.0)
MCH: 26.9 pg (ref 26.0–34.0)
MCHC: 32.2 g/dL (ref 30.0–36.0)
MCV: 83.7 fL (ref 80.0–100.0)
Monocytes Absolute: 0.5 10*3/uL (ref 0.1–1.0)
Monocytes Relative: 9 %
Neutro Abs: 3.9 10*3/uL (ref 1.7–7.7)
Neutrophils Relative %: 66 %
Platelet Count: 309 10*3/uL (ref 150–400)
RBC: 4.05 MIL/uL (ref 3.87–5.11)
RDW: 12.5 % (ref 11.5–15.5)
WBC Count: 5.8 10*3/uL (ref 4.0–10.5)
nRBC: 0 % (ref 0.0–0.2)

## 2022-08-23 MED ORDER — ACETAMINOPHEN 325 MG PO TABS
650.0000 mg | ORAL_TABLET | Freq: Once | ORAL | Status: AC
  Administered 2022-08-23: 650 mg via ORAL
  Filled 2022-08-23: qty 2

## 2022-08-23 MED ORDER — TRASTUZUMAB-DKST CHEMO 150 MG IV SOLR
6.0000 mg/kg | Freq: Once | INTRAVENOUS | Status: AC
  Administered 2022-08-23: 420 mg via INTRAVENOUS
  Filled 2022-08-23: qty 20

## 2022-08-23 MED ORDER — SODIUM CHLORIDE 0.9% FLUSH
10.0000 mL | INTRAVENOUS | Status: DC | PRN
  Administered 2022-08-23: 10 mL

## 2022-08-23 MED ORDER — SODIUM CHLORIDE 0.9% FLUSH
10.0000 mL | Freq: Once | INTRAVENOUS | Status: AC
  Administered 2022-08-23: 10 mL

## 2022-08-23 MED ORDER — HEPARIN SOD (PORK) LOCK FLUSH 100 UNIT/ML IV SOLN
500.0000 [IU] | Freq: Once | INTRAVENOUS | Status: AC | PRN
  Administered 2022-08-23: 500 [IU]

## 2022-08-23 MED ORDER — DIPHENHYDRAMINE HCL 25 MG PO CAPS
25.0000 mg | ORAL_CAPSULE | Freq: Once | ORAL | Status: AC
  Administered 2022-08-23: 25 mg via ORAL
  Filled 2022-08-23: qty 1

## 2022-08-23 MED ORDER — SODIUM CHLORIDE 0.9 % IV SOLN
420.0000 mg | Freq: Once | INTRAVENOUS | Status: AC
  Administered 2022-08-23: 420 mg via INTRAVENOUS
  Filled 2022-08-23: qty 14

## 2022-08-23 MED ORDER — SODIUM CHLORIDE 0.9 % IV SOLN: Freq: Once | INTRAVENOUS | Status: AC

## 2022-08-23 NOTE — Patient Instructions (Signed)
[facility]  Discharge Instructions: Thank you for choosing [facility] to provide your oncology and hematology care.   If you have a lab appointment with the [facility], please go directly to the [facility] and check in at the registration area.   Wear comfortable clothing and clothing appropriate for easy access to any Portacath or PICC line.   We strive to give you quality time with your provider. You may need to reschedule your appointment if you arrive late (15 or more minutes).  Arriving late affects you and other patients whose appointments are after yours.  Also, if you miss three or more appointments without notifying the office, you may be dismissed from the clinic at the provider's discretion.      For prescription refill requests, have your pharmacy contact our office and allow 72 hours for refills to be completed.    Today you received the following chemotherapy and/or immunotherapy agents: trastuzumab and pertuzumab      To help prevent nausea and vomiting after your treatment, we encourage you to take your nausea medication as directed.  BELOW ARE SYMPTOMS THAT SHOULD BE REPORTED IMMEDIATELY: *FEVER GREATER THAN 100.4 F (38 C) OR HIGHER *CHILLS OR SWEATING *NAUSEA AND VOMITING THAT IS NOT CONTROLLED WITH YOUR NAUSEA MEDICATION *UNUSUAL SHORTNESS OF BREATH *UNUSUAL BRUISING OR BLEEDING *URINARY PROBLEMS (pain or burning when urinating, or frequent urination) *BOWEL PROBLEMS (unusual diarrhea, constipation, pain near the anus) TENDERNESS IN MOUTH AND THROAT WITH OR WITHOUT PRESENCE OF ULCERS (sore throat, sores in mouth, or a toothache) UNUSUAL RASH, SWELLING OR PAIN  UNUSUAL VAGINAL DISCHARGE OR ITCHING   Items with * indicate a potential emergency and should be followed up as soon as possible or go to the Emergency Department if any problems should occur.  Please show the CHEMOTHERAPY ALERT CARD or IMMUNOTHERAPY ALERT  CARD at check-in to the Emergency Department and triage nurse.  Should you have questions after your visit or need to cancel or reschedule your appointment, please contact [facility]  Dept: [phone]  and follow the prompts.  Office hours are 8:00 a.m. to 4:30 p.m. Monday - Friday. Please note that voicemails left after 4:00 p.m. may not be returned until the following business day.  We are closed weekends and major holidays. You have access to a nurse at all times for urgent questions. Please call the main number to the clinic Dept: [phone] and follow the prompts.   For any non-urgent questions, you may also contact your provider using MyChart. We now offer e-Visits for anyone 19 and older to request care online for non-urgent symptoms. For details visit [email or web address].   Also download the MyChart app! Go to the app store, search "MyChart", open the app, select [facility], and log in with your MyChart username and password.  Masks are optional in the cancer centers. If you would like for your care team to wear a mask while they are taking care of you, please let them know. You may have one support person who is at least 77 years old accompany you for your appointments.

## 2022-09-07 DIAGNOSIS — R7301 Impaired fasting glucose: Secondary | ICD-10-CM | POA: Diagnosis not present

## 2022-09-07 DIAGNOSIS — I10 Essential (primary) hypertension: Secondary | ICD-10-CM | POA: Diagnosis not present

## 2022-09-07 DIAGNOSIS — G3184 Mild cognitive impairment, so stated: Secondary | ICD-10-CM | POA: Diagnosis not present

## 2022-09-07 DIAGNOSIS — C50919 Malignant neoplasm of unspecified site of unspecified female breast: Secondary | ICD-10-CM | POA: Diagnosis not present

## 2022-09-07 DIAGNOSIS — C7951 Secondary malignant neoplasm of bone: Secondary | ICD-10-CM | POA: Diagnosis not present

## 2022-09-07 DIAGNOSIS — F3341 Major depressive disorder, recurrent, in partial remission: Secondary | ICD-10-CM | POA: Diagnosis not present

## 2022-09-07 DIAGNOSIS — E782 Mixed hyperlipidemia: Secondary | ICD-10-CM | POA: Diagnosis not present

## 2022-09-12 NOTE — Progress Notes (Signed)
Patient Care Team: [name], MD as PCP - General (Family Medicine) Cameron Sprang, MD as Consulting Physician (Neurology) Mauro Kaufmann, RN as Oncology Nurse Navigator Rockwell Germany, RN as Oncology Nurse Navigator Coralie Keens, MD as Consulting Physician (General Surgery) Nicholas Lose, MD as Consulting Physician (Hematology and Oncology) Eppie Gibson, MD as Attending Physician (Radiation Oncology)  DIAGNOSIS: No diagnosis found.  SUMMARY OF ONCOLOGIC HISTORY: Oncology History  Malignant neoplasm of upper-outer quadrant of left breast in female, estrogen receptor negative ([place])  02/10/2022 Initial Diagnosis   Palpable left breast masses and calcifications: 1.8 cm at 1:00 and 1.7 cm at 9:00, calcifications not biopsy.  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 Report date is 03/05/2022.  The CustomNext-Cancer+RNAinsight panel offered by [facility] includes sequencing and rearrangement analysis for the following 48 genes:  APC, ATM, AXIN2, BARD1, BMPR1A, BRCA1, BRCA2, BRIP1, CDH1, CDK4, CDKN2A, CHEK2, CTNNA1, DICER1, EGFR, EPCAM, GREM1, HOXB13, KIT, MEN1, MLH1, MSH2, MSH3, MSH6, MUTYH, NBN, NF1, NTHL1, PALB2, PDGFRA, PMS2, POLD1, POLE, PTEN, RAD50, RAD51C, RAD51D, SDHA, SDHB, SDHC, SDHD, SMAD4, SMARCA4, STK11, TP53, TSC1, TSC2, and VHL.  RNA data is routinely analyzed for use in variant interpretation for all genes.   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, LCIS, 5/5 lymph nodes positive with extranodal extension, margins  negative ER 0%, PR 0%, HER2 3+, Ki-67 20%   04/27/2022 Cancer Staging   Staging form: Breast, AJCC 8th Edition - Pathologic: Stage IIIA (pT2, pN2, cM0, G2, ER-, PR-, HER2+) - Signed by Nicholas Lose, MD on 04/27/2022 Histologic grading system: 3 grade system   05/05/2022 - 05/26/2022 Chemotherapy   Patient is on Treatment Plan : BREAST DOCEtaxel + Trastuzumab + Pertuzumab (THP) q21d x 8 cycles / Trastuzumab + Pertuzumab q21d x 4 cycles     05/26/2022 -  Chemotherapy   Patient is on Treatment Plan : BREAST MAINTENANCE Trastuzumab IV (6) or SQ (600) D1 q21d X 11 Cycles       CHIEF COMPLIANT: Follow-up metastatic breast cancer   INTERVAL HISTORY: Allison Pierce is a 76 y.o with the above-mentioned metastatic breast cancer currently on herceptin perjeta. She presents to the clinic today for a follow-up.   ALLERGIES:  is allergic to gadolinium derivatives, atorvastatin calcium, azelaic acid, oxybutynin, prozac [fluoxetine hcl], ritalin [methylphenidate], rosuvastatin, and lyrica [pregabalin].  MEDICATIONS:  Current Outpatient Medications  Medication Sig Dispense Refill   B Complex-C (B COMPLEX-VITAMIN C) CAPS Take 1 capsule by mouth daily.     buPROPion (WELLBUTRIN XL) 300 MG 24 hr tablet Take 300 mg by mouth every morning.     calcium carbonate (TUMS - DOSED IN MG ELEMENTAL CALCIUM) 500 MG chewable tablet Chew 2 tablets by mouth daily as needed for indigestion or heartburn.     cholecalciferol (VITAMIN D3) 25 MCG (1000 UNIT) tablet Take 1,000 Units by mouth daily.     clonazePAM (KLONOPIN) 1 MG tablet Take 1 mg by mouth at bedtime.     Cyanocobalamin (VITAMIN B-12) 5000 MCG TBDP Take 5,000 Units by mouth daily.  diphenhydrAMINE (BENADRYL) 25 MG tablet Take 25 mg by mouth in the morning.     fluticasone (FLONASE) 50 MCG/ACT nasal spray Place 1 spray into both nostrils daily as needed for allergies.     losartan (COZAAR) 25 MG tablet Take 50 mg by mouth daily.     meloxicam (MOBIC)  15 MG tablet Take 15 mg by mouth daily.     metoprolol succinate (TOPROL XL) 25 MG 24 hr tablet Take 1 tablet (25 mg total) by mouth daily. 90 tablet 3   Potassium 99 MG TABS Take 99 mg by mouth at bedtime.     traZODone (DESYREL) 150 MG tablet Take 225 mg by mouth at bedtime.     venlafaxine XR (EFFEXOR-XR) 75 MG 24 hr capsule Take 75 mg by mouth daily with breakfast.     No current facility-administered medications for this visit.    PHYSICAL EXAMINATION: ECOG PERFORMANCE STATUS: {CHL ONC ECOG PS:[id number]}  There were no vitals filed for this visit. There were no vitals filed for this visit.  BREAST:*** No palpable masses or nodules in either right or left breasts. No palpable axillary supraclavicular or infraclavicular adenopathy no breast tenderness or nipple discharge. (exam performed in the presence of a chaperone)  LABORATORY DATA:  I have reviewed the data as listed    Latest Ref Rng & Units 08/23/2022    8:08 AM 08/02/2022    9:15 AM 07/12/2022    8:05 AM  CMP  Glucose 70 - 99 mg/dL 113  90  135   BUN 8 - 23 mg/dL _0 Creatinine 0.44 - 1.00 mg/dL 1.01  0.93  0.93   Sodium 135 - 145 mmol/L 139  138  138   Potassium 3.5 - 5.1 mmol/L 3.8  4.0  3.6   Chloride 98 - 111 mmol/L 107  106  106   CO2 22 - 32 mmol/L _1 Calcium 8.9 - 10.3 mg/dL 9.0  8.5  8.7   Total Protein 6.5 - 8.1 g/dL 5.9  5.8  6.0   Total Bilirubin 0.3 - 1.2 mg/dL 0.4  0.4  0.5   Alkaline Phos 38 - 126 U/L 76  73  78   AST 15 - 41 U/L _2 ALT 0 - 44 U/L _3 Lab Results  Component Value Date   WBC 5.8 08/23/2022   HGB 10.9 (L) 08/23/2022   HCT 33.9 (L) 08/23/2022   MCV 83.7 08/23/2022   PLT 309 08/23/2022   NEUTROABS 3.9 08/23/2022    ASSESSMENT & PLAN:  No problem-specific Assessment & Plan notes found for this encounter.    No orders of the defined types were placed in this encounter.  The patient has a good understanding of the overall plan. she  agrees with it. she will call with any problems that may develop before the next visit here. Total time spent: 30 mins including face to face time and time spent for planning, charting and co-ordination of care   Suzzette Righter, [place] 09/12/22    I Monserrath Junio, Ruthella Kirchman am acting as a Education administrator for [employer]  ***

## 2022-09-13 ENCOUNTER — Inpatient Hospital Stay: Payer: Medicare Other

## 2022-09-13 ENCOUNTER — Inpatient Hospital Stay: Payer: Medicare Other | Attending: Hematology and Oncology | Admitting: Hematology and Oncology

## 2022-09-13 VITALS — BP 110/67 | HR 67 | Temp 98.0°F | Resp 18

## 2022-09-13 VITALS — BP 151/76 | HR 70 | Temp 97.7°F | Resp 18 | Ht 66.0 in | Wt 149.7 lb

## 2022-09-13 DIAGNOSIS — C7951 Secondary malignant neoplasm of bone: Secondary | ICD-10-CM | POA: Insufficient documentation

## 2022-09-13 DIAGNOSIS — R197 Diarrhea, unspecified: Secondary | ICD-10-CM | POA: Diagnosis not present

## 2022-09-13 DIAGNOSIS — Z5112 Encounter for antineoplastic immunotherapy: Secondary | ICD-10-CM | POA: Insufficient documentation

## 2022-09-13 DIAGNOSIS — Z923 Personal history of irradiation: Secondary | ICD-10-CM | POA: Insufficient documentation

## 2022-09-13 DIAGNOSIS — Z171 Estrogen receptor negative status [ER-]: Secondary | ICD-10-CM | POA: Diagnosis not present

## 2022-09-13 DIAGNOSIS — C50412 Malignant neoplasm of upper-outer quadrant of left female breast: Secondary | ICD-10-CM | POA: Diagnosis not present

## 2022-09-13 DIAGNOSIS — Z9012 Acquired absence of left breast and nipple: Secondary | ICD-10-CM | POA: Insufficient documentation

## 2022-09-13 DIAGNOSIS — Z95828 Presence of other vascular implants and grafts: Secondary | ICD-10-CM

## 2022-09-13 LAB — CBC WITH DIFFERENTIAL (CANCER CENTER ONLY)
Abs Immature Granulocytes: 0.01 10*3/uL (ref 0.00–0.07)
Basophils Absolute: 0.1 10*3/uL (ref 0.0–0.1)
Basophils Relative: 1 %
Eosinophils Absolute: 0.3 10*3/uL (ref 0.0–0.5)
Eosinophils Relative: 4 %
HCT: 31.6 % — ABNORMAL LOW (ref 36.0–46.0)
Hemoglobin: 10.5 g/dL — ABNORMAL LOW (ref 12.0–15.0)
Immature Granulocytes: 0 %
Lymphocytes Relative: 18 %
Lymphs Abs: 1.2 10*3/uL (ref 0.7–4.0)
MCH: 27.1 pg (ref 26.0–34.0)
MCHC: 33.2 g/dL (ref 30.0–36.0)
MCV: 81.7 fL (ref 80.0–100.0)
Monocytes Absolute: 0.6 10*3/uL (ref 0.1–1.0)
Monocytes Relative: 9 %
Neutro Abs: 4.4 10*3/uL (ref 1.7–7.7)
Neutrophils Relative %: 68 %
Platelet Count: 267 10*3/uL (ref 150–400)
RBC: 3.87 MIL/uL (ref 3.87–5.11)
RDW: 13 % (ref 11.5–15.5)
WBC Count: 6.6 10*3/uL (ref 4.0–10.5)
nRBC: 0 % (ref 0.0–0.2)

## 2022-09-13 LAB — CMP (CANCER CENTER ONLY)
ALT: 15 U/L (ref 0–44)
AST: 17 U/L (ref 15–41)
Albumin: 3.5 g/dL (ref 3.5–5.0)
Alkaline Phosphatase: 84 U/L (ref 38–126)
Anion gap: 4 — ABNORMAL LOW (ref 5–15)
BUN: 13 mg/dL (ref 8–23)
CO2: 28 mmol/L (ref 22–32)
Calcium: 8.8 mg/dL — ABNORMAL LOW (ref 8.9–10.3)
Chloride: 105 mmol/L (ref 98–111)
Creatinine: 0.9 mg/dL (ref 0.44–1.00)
GFR, Estimated: >60 mL/min (ref >60)
Glucose, Bld: 113 mg/dL — ABNORMAL HIGH (ref 70–99)
Potassium: 3.6 mmol/L (ref 3.5–5.1)
Sodium: 137 mmol/L (ref 135–145)
Total Bilirubin: 0.3 mg/dL (ref 0.3–1.2)
Total Protein: 5.3 g/dL — ABNORMAL LOW (ref 6.5–8.1)

## 2022-09-13 MED ORDER — ACETAMINOPHEN 325 MG PO TABS
650.0000 mg | ORAL_TABLET | Freq: Once | ORAL | Status: AC
  Administered 2022-09-13: 650 mg via ORAL
  Filled 2022-09-13: qty 2

## 2022-09-13 MED ORDER — HEPARIN SOD (PORK) LOCK FLUSH 100 UNIT/ML IV SOLN
500.0000 [IU] | Freq: Once | INTRAVENOUS | Status: AC | PRN
  Administered 2022-09-13: 500 [IU]

## 2022-09-13 MED ORDER — SODIUM CHLORIDE 0.9 % IV SOLN: Freq: Once | INTRAVENOUS | Status: AC

## 2022-09-13 MED ORDER — SODIUM CHLORIDE 0.9% FLUSH
10.0000 mL | INTRAVENOUS | Status: DC | PRN
  Administered 2022-09-13: 10 mL

## 2022-09-13 MED ORDER — SODIUM CHLORIDE 0.9% FLUSH
10.0000 mL | Freq: Once | INTRAVENOUS | Status: AC
  Administered 2022-09-13: 10 mL

## 2022-09-13 MED ORDER — TRASTUZUMAB-DKST CHEMO 150 MG IV SOLR
6.0000 mg/kg | Freq: Once | INTRAVENOUS | Status: AC
  Administered 2022-09-13: 420 mg via INTRAVENOUS
  Filled 2022-09-13: qty 20

## 2022-09-13 MED ORDER — SODIUM CHLORIDE 0.9 % IV SOLN
420.0000 mg | Freq: Once | INTRAVENOUS | Status: AC
  Administered 2022-09-13: 420 mg via INTRAVENOUS
  Filled 2022-09-13: qty 14

## 2022-09-13 MED ORDER — DIPHENHYDRAMINE HCL 25 MG PO CAPS
25.0000 mg | ORAL_CAPSULE | Freq: Once | ORAL | Status: AC
  Administered 2022-09-13: 25 mg via ORAL
  Filled 2022-09-13: qty 1

## 2022-09-13 NOTE — Assessment & Plan Note (Addendum)
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  T4 vertebral body Bone scan 04/25/2022: Abnormal uptake in the sternum, right parietal region of calvarium, right posterior seventh rib, T5, left sternoclavicular joint   Treatment: Palliative chemotherapy with Taxotere [name] and Perjeta every 3 weeks x1 cycle given 05/05/2022 (discontinued for severe toxicities and hospitalization)   ---------------------------------------------------------------------------------------------------------------------------------------------- Chemotoxicities: Hospitalization 05/11/2022-05/18/2022: Convulsive syncope, chemo induced neutropenia, oral mucositis, chronic CHF, severe diarrhea with dehydration ECHO 06/26/22: EF 55-60% (follows with [facility])   Treatment plan:05/26/22 [name]. added Perjeta 06/21/22.   Severe constipation: Normal bowel movements   Bone scan 07/30/2022: Interval resolution and or decreasing radiotracer accumulation within the multiple bone metastases showing response to treatment. Based on the above results we will continue with [name] and Perjeta.  This will continue till 12/27/2022  Return to clinic every 3 weeks for HP and every 6 weeks for follow up with me.

## 2022-09-13 NOTE — Patient Instructions (Signed)
[facility]  Discharge Instructions: Thank you for choosing [facility] to provide your oncology and hematology care.   If you have a lab appointment with the [facility], please go directly to the [facility] and check in at the registration area.   Wear comfortable clothing and clothing appropriate for easy access to any Portacath or PICC line.   We strive to give you quality time with your provider. You may need to reschedule your appointment if you arrive late (15 or more minutes).  Arriving late affects you and other patients whose appointments are after yours.  Also, if you miss three or more appointments without notifying the office, you may be dismissed from the clinic at the provider's discretion.      For prescription refill requests, have your pharmacy contact our office and allow 72 hours for refills to be completed.    Today you received the following chemotherapy and/or immunotherapy agents herceptin, perjeta      To help prevent nausea and vomiting after your treatment, we encourage you to take your nausea medication as directed.  BELOW ARE SYMPTOMS THAT SHOULD BE REPORTED IMMEDIATELY: *FEVER GREATER THAN 100.4 F (38 C) OR HIGHER *CHILLS OR SWEATING *NAUSEA AND VOMITING THAT IS NOT CONTROLLED WITH YOUR NAUSEA MEDICATION *UNUSUAL SHORTNESS OF BREATH *UNUSUAL BRUISING OR BLEEDING *URINARY PROBLEMS (pain or burning when urinating, or frequent urination) *BOWEL PROBLEMS (unusual diarrhea, constipation, pain near the anus) TENDERNESS IN MOUTH AND THROAT WITH OR WITHOUT PRESENCE OF ULCERS (sore throat, sores in mouth, or a toothache) UNUSUAL RASH, SWELLING OR PAIN  UNUSUAL VAGINAL DISCHARGE OR ITCHING   Items with * indicate a potential emergency and should be followed up as soon as possible or go to the Emergency Department if any problems should occur.  Please show the CHEMOTHERAPY ALERT CARD or IMMUNOTHERAPY ALERT CARD at  check-in to the Emergency Department and triage nurse.  Should you have questions after your visit or need to cancel or reschedule your appointment, please contact [facility]  Dept: [phone]  and follow the prompts.  Office hours are 8:00 a.m. to 4:30 p.m. Monday - Friday. Please note that voicemails left after 4:00 p.m. may not be returned until the following business day.  We are closed weekends and major holidays. You have access to a nurse at all times for urgent questions. Please call the main number to the clinic Dept: [phone] and follow the prompts.   For any non-urgent questions, you may also contact your provider using MyChart. We now offer e-Visits for anyone 53 and older to request care online for non-urgent symptoms. For details visit [email or web address].   Also download the MyChart app! Go to the app store, search "MyChart", open the app, select [facility], and log in with your MyChart username and password.  Masks are optional in the cancer centers. If you would like for your care team to wear a mask while they are taking care of you, please let them know. You may have one support person who is at least 77 years old accompany you for your appointments.

## 2022-09-20 ENCOUNTER — Encounter: Payer: Self-pay | Admitting: Hematology and Oncology

## 2022-09-21 ENCOUNTER — Ambulatory Visit (HOSPITAL_COMMUNITY)
Admission: RE | Admit: 2022-09-21 | Discharge: 2022-09-21 | Disposition: A | Discharge status: Home / Self Care | Payer: Medicare Other | Source: Ambulatory Visit | Attending: Internal Medicine | Admitting: Internal Medicine

## 2022-09-21 ENCOUNTER — Encounter (HOSPITAL_COMMUNITY): Payer: Self-pay | Admitting: Internal Medicine

## 2022-09-21 ENCOUNTER — Ambulatory Visit (HOSPITAL_BASED_OUTPATIENT_CLINIC_OR_DEPARTMENT_OTHER)
Admission: RE | Admit: 2022-09-21 | Discharge: 2022-09-21 | Disposition: A | Discharge status: Home / Self Care | Payer: Medicare Other | Source: Ambulatory Visit

## 2022-09-21 VITALS — BP 110/70 | HR 69 | Wt 147.4 lb

## 2022-09-21 DIAGNOSIS — T451X5A Adverse effect of antineoplastic and immunosuppressive drugs, initial encounter: Secondary | ICD-10-CM | POA: Diagnosis not present

## 2022-09-21 DIAGNOSIS — R931 Abnormal findings on diagnostic imaging of heart and coronary circulation: Secondary | ICD-10-CM | POA: Insufficient documentation

## 2022-09-21 DIAGNOSIS — C50212 Malignant neoplasm of upper-inner quadrant of left female breast: Secondary | ICD-10-CM | POA: Diagnosis not present

## 2022-09-21 DIAGNOSIS — I110 Hypertensive heart disease with heart failure: Secondary | ICD-10-CM | POA: Insufficient documentation

## 2022-09-21 DIAGNOSIS — Z803 Family history of malignant neoplasm of breast: Secondary | ICD-10-CM | POA: Diagnosis not present

## 2022-09-21 DIAGNOSIS — Z79899 Other long term (current) drug therapy: Secondary | ICD-10-CM | POA: Diagnosis not present

## 2022-09-21 DIAGNOSIS — Z9012 Acquired absence of left breast and nipple: Secondary | ICD-10-CM | POA: Diagnosis not present

## 2022-09-21 DIAGNOSIS — I5032 Chronic diastolic (congestive) heart failure: Secondary | ICD-10-CM | POA: Diagnosis not present

## 2022-09-21 DIAGNOSIS — F419 Anxiety disorder, unspecified: Secondary | ICD-10-CM | POA: Insufficient documentation

## 2022-09-21 DIAGNOSIS — I427 Cardiomyopathy due to drug and external agent: Secondary | ICD-10-CM

## 2022-09-21 DIAGNOSIS — Z0189 Encounter for other specified special examinations: Secondary | ICD-10-CM | POA: Diagnosis not present

## 2022-09-21 DIAGNOSIS — E785 Hyperlipidemia, unspecified: Secondary | ICD-10-CM | POA: Diagnosis not present

## 2022-09-21 LAB — ECHOCARDIOGRAM COMPLETE
Area-P 1/2: 3.68 cm2
S' Lateral: 3.2 cm

## 2022-09-21 NOTE — Progress Notes (Signed)
CARDIO-ONCOLOGY CLINIC NOTE  Referring Physician: Dr. Lindi Adie Primary Care: [name], MD Primary Cardiologist: None   HPI:  Allison Pierce is a 76 y.o. female with HTN, HL, anxiety and left breast cancer referred by Dr. Lindi Adie for further evaluation of reduced EF on echocardiogram.   No previous cardiac history. Was diagnosed with left breast CA in 5/23. Stage 1A (ER/PR -, HER2+). Underwent left mastectomy 7/23.   Chemo: 8/23: Docetaxel + Trastuzumab + Pertuzumab (THP) q21d x 8 cycles / Trastuzumab + Pertuzumab q21d x 4 cycles  Maintenance: Trastuzumab IV (6) or SQ (600) D1 q21d X 11 Cycles   Echos: 03/01/22: EF 60-65% G2DD GLs -18.6% 06/26/22:  EF 55-60% GLS -14.1%  Here for routine f/u. Remains on [name] and Perjeta. Tolerating well. No CP or SOB. Having some diarrhea with infusions.   Tolerating losartan and Toprol  Echo today 09/21/22" EF 60-65% Personally reviewed  Past Medical History:  Diagnosis Date   Anxiety    Depression    GERD (gastroesophageal reflux disease) OCCASIONALLY  TAKE TUMS   Human papilloma virus    Hyperlipidemia    Hypertension    UTI (urinary tract infection)    Vulvar lesion     Current Outpatient Medications  Medication Sig Dispense Refill   B Complex-C (B COMPLEX-VITAMIN C) CAPS Take 1 capsule by mouth daily.     buPROPion (WELLBUTRIN XL) 300 MG 24 hr tablet Take 300 mg by mouth every morning.     calcium carbonate (TUMS - DOSED IN MG ELEMENTAL CALCIUM) 500 MG chewable tablet Chew 2 tablets by mouth daily as needed for indigestion or heartburn.     cholecalciferol (VITAMIN D3) 25 MCG (1000 UNIT) tablet Take 1,000 Units by mouth daily.     clonazePAM (KLONOPIN) 1 MG tablet Take 1 mg by mouth at bedtime.     Cyanocobalamin (VITAMIN B-12) 5000 MCG TBDP Take 5,000 Units by mouth daily.     diphenhydrAMINE (BENADRYL) 25 MG tablet Take 25 mg by mouth in the morning.     fluticasone (FLONASE) 50 MCG/ACT nasal spray Place 1 spray into both  nostrils daily as needed for allergies.     losartan (COZAAR) 25 MG tablet Take 50 mg by mouth daily.     meloxicam (MOBIC) 15 MG tablet Take 15 mg by mouth daily.     metoprolol succinate (TOPROL XL) 25 MG 24 hr tablet Take 1 tablet (25 mg total) by mouth daily. 90 tablet 3   Potassium 99 MG TABS Take 99 mg by mouth at bedtime.     traZODone (DESYREL) 150 MG tablet Take 225 mg by mouth at bedtime.     venlafaxine XR (EFFEXOR-XR) 75 MG 24 hr capsule Take 75 mg by mouth daily with breakfast.     No current facility-administered medications for this encounter.    Allergies  Allergen Reactions   Gadolinium Derivatives Itching and Other (See Comments)    Redness and itching, skins feels like it was burning. In the future pt will need pre med.   Atorvastatin Calcium Other (See Comments)    Unknown per Pt     Azelaic Acid Other (See Comments)    Unknown per Pt    Oxybutynin Other (See Comments)    Unknown per Pt    Prozac [Fluoxetine Hcl] Other (See Comments)    "Could not lift head up or get out of bed"    Ritalin [Methylphenidate] Other (See Comments)    Unknown per Pt  Rosuvastatin Other (See Comments)    Unknown per Pt    Lyrica [Pregabalin] Anxiety and Other (See Comments)    "Felt weird"      Social History   Socioeconomic History   Marital status: Married    Spouse name: [name]   Number of children: 1   Years of education: 12   Highest education level: Not on file  Occupational History   Not on file  Tobacco Use   Smoking status: Never   Smokeless tobacco: Never  Vaping Use   Vaping Use: Never used  Substance and Sexual Activity   Alcohol use: Yes    Alcohol/week: 7.0 standard drinks of alcohol    Types: 7 Cans of beer per week   Drug use: Never   Sexual activity: Not Currently  Other Topics Concern   Not on file  Social History Narrative   Graduated HS      Right handed      Lives with husband   Social Determinants of Health   Financial Resource  Strain: Low Risk  (02/22/2022)   Overall Financial Resource Strain (CARDIA)    Difficulty of Paying Living Expenses: Not hard at all  Food Insecurity: No Food Insecurity (02/22/2022)   Hunger Vital Sign    Worried About Running Out of Food in the Last Year: Never true    Ran Out of Food in the Last Year: Never true  Transportation Needs: No Transportation Needs (02/22/2022)   PRAPARE - Hydrologist (Medical): No    Lack of Transportation (Non-Medical): No  Physical Activity: Not on file  Stress: Not on file  Social Connections: Not on file  Intimate Partner Violence: Not on file      Family History  Problem Relation Age of Onset   Lung cancer Mother 69       she smoked   Prostate cancer Brother 43   Breast cancer Cousin        paternal first cousin   Breast cancer Cousin        paternal first cousin    Vitals:   09/21/22 1531  BP: 110/70  Pulse: 69  SpO2: 98%  Weight: 66.9 kg (147 lb 6.4 oz)    PHYSICAL EXAM: General:  Well appearing. No resp difficulty HEENT: normal Neck: supple. Porta cath RIJCarotids 2+ bilat; no bruits. No lymphadenopathy or thryomegaly appreciated. Cor: PMI nondisplaced. Regular rate & rhythm. No rubs, gallops or murmurs. Lungs: clear Abdomen: soft, nontender, nondistended. No hepatosplenomegaly. No bruits or masses. Good bowel sounds. Extremities: no cyanosis, clubbing, rash, edema Neuro: alert & orientedx3, cranial nerves grossly intact. moves all 4 extremities w/o difficulty. Affect pleasant   ASSESSMENT & PLAN:  1. Abnormal echocardiogram, potential herceptin cardiotoxicity - Echo 03/01/22: EF 60-65% G2DD GLs -18.6% - Echo 06/26/22: EF 55-60% GLS -14.1% - I have reviewed both her pre-chemo echocardiogram and her most recent echo side-by-side. Although EF on the most recent one may be slightly less vigorous than her pre-chemo echo it is still well within the normal range. Strain imaging is not ideal due to poor  endocardial tracking on most recent echo but also suggests a possible slight decrease in contractile force - Although changes are minor it does fit the time course for potential herceptin cardiotoxicity.  - Echo today 09/21/22 EF 60-65% (completely normal) Personally reviewed - Continue losartan and Toprol  - Continue echos q3 months with on H/P.  2. Left Breast Cancer - diagnosed 5/23. Stage  1A (ER/PR -, HER2+).  - s/p left mastectomy 7/23.  - s/p 8/23: Docetaxel + Trastuzumab + Pertuzumab (THP) q21d x 8 cycles / Trastuzumab + Pertuzumab q21d x 4 cycles  - s/pTrastuzumab IV (6) or SQ (600) D1 q21d X 11 Cycles  - Stable continue [name]/Perjeta  Glori Bickers, MD  3:46 PM

## 2022-09-21 NOTE — Addendum Note (Signed)
Encounter addended by: Kerry Dory, CMA on: 09/21/2022 3:56 PM  Actions taken: Clinical Note Signed

## 2022-09-21 NOTE — Patient Instructions (Signed)
It was great to see you today! No medication changes are needed at this time.    Your physician recommends that you schedule a follow-up appointment in: 3 months with Dr Haroldine Laws and echo  Your physician has requested that you have an echocardiogram. Echocardiography is a painless test that uses sound waves to create images of your heart. It provides your doctor with information about the size and shape of your heart and how well your heart's chambers and valves are working. This procedure takes approximately one hour. There are no restrictions for this procedure. Please do NOT wear cologne, perfume, aftershave, or lotions (deodorant is allowed). Please arrive 15 minutes prior to your appointment time.   At the [facility], you and your health needs are our priority. As part of our continuing mission to provide you with exceptional heart care, we have created designated Provider Care Teams. These Care Teams include your primary Cardiologist (physician) and Advanced Practice Providers (APPs- Physician Assistants and Nurse Practitioners) who all work together to provide you with the care you need, when you need it.   You may see any of the following providers on your designated Care Team at your next follow up: Dr Glori Bickers Dr Loralie Champagne Dr. Roxana Hires, NP Lyda Jester, [place] [facility] [place], [place] Forestine Na, NP Audry Riles, PharmD   Please be sure to bring in all your medications bottles to every appointment.

## 2022-09-21 NOTE — Addendum Note (Signed)
Encounter addended by: Kerry Dory, CMA on: 09/21/2022 3:54 PM  Actions taken: Order list changed, Diagnosis association updated

## 2022-09-26 DIAGNOSIS — H524 Presbyopia: Secondary | ICD-10-CM | POA: Diagnosis not present

## 2022-09-26 DIAGNOSIS — Z961 Presence of intraocular lens: Secondary | ICD-10-CM | POA: Diagnosis not present

## 2022-10-03 ENCOUNTER — Ambulatory Visit
Admission: EM | Admit: 2022-10-03 | Discharge: 2022-10-03 | Disposition: A | Discharge status: Home / Self Care | Payer: Medicare Other | Attending: Physician Assistant | Admitting: Physician Assistant

## 2022-10-03 DIAGNOSIS — Z1152 Encounter for screening for COVID-19: Secondary | ICD-10-CM | POA: Diagnosis not present

## 2022-10-03 DIAGNOSIS — J069 Acute upper respiratory infection, unspecified: Secondary | ICD-10-CM | POA: Insufficient documentation

## 2022-10-03 DIAGNOSIS — D849 Immunodeficiency, unspecified: Secondary | ICD-10-CM | POA: Diagnosis not present

## 2022-10-03 HISTORY — DX: Malignant (primary) neoplasm, unspecified: C80.1

## 2022-10-03 MED ORDER — PROMETHAZINE-DM 6.25-15 MG/5ML PO SYRP: 2.5000 mL | ORAL_SOLUTION | Freq: Four times a day (QID) | ORAL | 0 refills | Status: DC | PRN

## 2022-10-03 MED ORDER — AMOXICILLIN-POT CLAVULANATE 875-125 MG PO TABS: 1.0000 | ORAL_TABLET | Freq: Two times a day (BID) | ORAL | 0 refills | Status: DC

## 2022-10-03 MED ORDER — BENZONATATE 100 MG PO CAPS: 100.0000 mg | ORAL_CAPSULE | Freq: Three times a day (TID) | ORAL | 0 refills | Status: DC

## 2022-10-03 NOTE — ED Provider Notes (Signed)
[facility]    CSN: [id number] Arrival date & time: 10/03/22  0801      History   Chief Complaint Chief Complaint  Patient presents with   URI    HPI Allison Pierce is a 77 y.o. female.   Patient here today with husband for evaluation of chest congestion and cough with associated body aches that started 3 days ago.  She has not had known fever.  She denies any vomiting or diarrhea.  Patient is undergoing treatment for breast cancer at this time and has treatment scheduled for tomorrow.  Her husband was recently sick with similar symptoms.  She has been taking over-the-counter medication without resolution of symptoms.  The history is provided by the patient.    Past Medical History:  Diagnosis Date   Anxiety    Cancer ([place])    Depression    GERD (gastroesophageal reflux disease) OCCASIONALLY  TAKE TUMS   Human papilloma virus    Hyperlipidemia    Hypertension    UTI (urinary tract infection)    Vulvar lesion     Patient Active Problem List   Diagnosis Date Noted   Shingles 05/15/2022   Colitis 05/13/2022   Alcohol abuse 05/12/2022   Gastroesophageal reflux disease 05/12/2022   Irritable bowel syndrome 05/12/2022   Mild cognitive impairment 05/12/2022   Urinary incontinence 05/12/2022   Allergic rhinitis 05/12/2022   Recurrent major depression in remission ([place]) 05/12/2022   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[place]) 05/12/2022   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   Acute colitis 05/12/2022   Lactic acidosis 05/12/2022   Chemotherapy induced neutropenia ([place]) 05/12/2022   Chronic diastolic CHF (congestive heart failure) ([place]) 05/12/2022   Oral mucositis 05/12/2022   Port-A-Cath in place 05/05/2022   Genetic testing 03/06/2022   Family history of breast cancer 02/23/2022   Family history of prostate cancer 02/23/2022   Essential hypertension 02/22/2022   Malignant neoplasm of upper-outer quadrant of left breast in female, estrogen receptor  negative ([place]) 02/20/2022   Malignant neoplasm of upper-inner quadrant of left female breast ([place]) 02/20/2022   Right-sided epistaxis 07/19/2017   Anxiety state 01/24/2013   Arthropathy 01/24/2013   Depressive disorder, not elsewhere classified 01/24/2013   Mixed hyperlipidemia 01/24/2013    Past Surgical History:  Procedure Laterality Date   ABDOMINAL HYSTERECTOMY  1992   W/ SALPINGO-OOPHORECTOMY   ANTERIOR CERVICAL DECOMP/DISCECTOMY FUSION  01-18-2009  DR POOLE   C4  - C6   AUGMENTATION MAMMAPLASTY Bilateral    BREAST BIOPSY Right    BREAST BIOPSY Left 02/10/2022   x2   BREAST ENHANCEMENT SURGERY  2011   BREAST EXCISIONAL BIOPSY Left    BREAST RECONSTRUCTION WITH PLACEMENT OF TISSUE EXPANDER AND FLEX HD (ACELLULAR HYDRATED DERMIS) Left 04/07/2022   Procedure: BREAST RECONSTRUCTION WITH PLACEMENT OF TISSUE EXPANDER AND FLEX HD (ACELLULAR HYDRATED DERMIS);  Surgeon: [name], MD;  Location: [facility];  Service: Plastics;  Laterality: Left;   BREAST SURGERY  02-04-2011  dr Margot Chimes   EXCISION LEFT BREAST MASS--  CALCIFICATION   EYE SURGERY Bilateral    cataract removal   MASTECTOMY W/ SENTINEL NODE BIOPSY Left 04/07/2022   Procedure: LEFT MASTECTOMY WITH SENTINEL NODE BIOPSY;  Surgeon: Coralie Keens, MD;  Location: [facility];  Service: General;  Laterality: Left;  LMA   PORTACATH PLACEMENT N/A 04/07/2022   Procedure: PORT-A-CATH INSERTION WITH ULTRASOUND GUIDANCE;  Surgeon: Coralie Keens, MD;  Location: [place] [facility];  Service: General;  Laterality: N/A;   VULVAR LESION  REMOVAL  09/10/2012   Procedure: VULVAR LESION;  Surgeon: Selinda Orion, MD;  Location: [facility];  Service: Gynecology;  Laterality: N/A;  WIDE EXCISION OF VULVAR LESION    OB History   No obstetric history on file.      Home Medications    Prior to Admission medications   Medication Sig Start Date End Date Taking? Authorizing Provider  amoxicillin-clavulanate (AUGMENTIN) 875-125 MG tablet Take 1  tablet by mouth every 12 (twelve) hours. 10/03/22  Yes Francene Finders, PA-C  benzonatate (TESSALON) 100 MG capsule Take 1 capsule (100 mg total) by mouth every 8 (eight) hours. 10/03/22  Yes Francene Finders, PA-C  promethazine-dextromethorphan (PROMETHAZINE-DM) 6.25-15 MG/5ML syrup Take 2.5 mLs by mouth 4 (four) times daily as needed for cough. 10/03/22  Yes Francene Finders, PA-C  B Complex-C (B COMPLEX-VITAMIN C) CAPS Take 1 capsule by mouth daily.    [provider]  buPROPion (WELLBUTRIN XL) 300 MG 24 hr tablet Take 300 mg by mouth every morning.    [provider]  calcium carbonate (TUMS - DOSED IN MG ELEMENTAL CALCIUM) 500 MG chewable tablet Chew 2 tablets by mouth daily as needed for indigestion or heartburn.    [provider]  cholecalciferol (VITAMIN D3) 25 MCG (1000 UNIT) tablet Take 1,000 Units by mouth daily.    [provider]  clonazePAM (KLONOPIN) 1 MG tablet Take 1 mg by mouth at bedtime.    [provider]  Cyanocobalamin (VITAMIN B-12) 5000 MCG TBDP Take 5,000 Units by mouth daily.    [provider]  diphenhydrAMINE (BENADRYL) 25 MG tablet Take 25 mg by mouth in the morning.    [provider]  fluticasone (FLONASE) 50 MCG/ACT nasal spray Place 1 spray into both nostrils daily as needed for allergies.    [provider]  losartan (COZAAR) 25 MG tablet Take 50 mg by mouth daily. 01/13/22   [provider]  meloxicam (MOBIC) 15 MG tablet Take 15 mg by mouth daily.    [provider]  metoprolol succinate (TOPROL XL) 25 MG 24 hr tablet Take 1 tablet (25 mg total) by mouth daily. 07/11/22   Bensimhon, Shaune Pascal, MD  Potassium 99 MG TABS Take 99 mg by mouth at bedtime.    [provider]  traZODone (DESYREL) 150 MG tablet Take 225 mg by mouth at bedtime.    [provider]  venlafaxine XR (EFFEXOR-XR) 75 MG 24 hr capsule Take 75 mg by mouth daily with breakfast.    [provider]  prochlorperazine (COMPAZINE) 10 MG tablet Take 1 tablet (10 mg total) by mouth every 6 (six) hours as needed (Nausea or vomiting). Patient not taking: Reported on 05/12/2022 04/27/22 06/07/22  Nicholas Lose, MD    Family History Family History  Problem Relation Age of Onset   Lung cancer Mother 36       she smoked   Prostate cancer Brother 73   Breast cancer Cousin        paternal first cousin   Breast cancer Cousin        paternal first cousin    Social History Social History   Tobacco Use   Smoking status: Never   Smokeless tobacco: Never  Vaping Use   Vaping Use: Never used  Substance Use Topics   Alcohol use: Yes    Alcohol/week: 7.0 standard drinks of alcohol    Types: 7 Cans of beer per week  Drug use: Never     Allergies   Gadolinium derivatives, Atorvastatin calcium, Azelaic acid, Oxybutynin, Prozac [fluoxetine hcl], Ritalin [methylphenidate], Rosuvastatin, and Lyrica [pregabalin]   Review of Systems Review of Systems  Constitutional:  Negative for chills and fever.  HENT:  Positive for congestion and sore throat. Negative for ear pain.   Eyes:  Negative for discharge and redness.  Respiratory:  Positive for cough. Negative for shortness of breath and wheezing.   Gastrointestinal:  Negative for diarrhea, nausea and vomiting.  Musculoskeletal:  Positive for myalgias.     Physical Exam Triage Vital Signs ED Triage Vitals  Enc Vitals Group     BP      Pulse      Resp      Temp      Temp src      SpO2      Weight      Height      Head Circumference      Peak Flow      Pain Score      Pain Loc      Pain Edu?      Excl. in [place]?    No data found.  Updated Vital Signs BP 136/72 (BP Location: Right Arm)   Pulse 83   Temp 98.2 F (36.8 C) (Oral)   Resp 19   SpO2 95%      Physical Exam Vitals and nursing note reviewed.  Constitutional:      General: She is not in acute distress.    Appearance: Normal appearance. She is not  ill-appearing.  HENT:     Head: Normocephalic and atraumatic.     Nose: Congestion present.     Mouth/Throat:     Mouth: Mucous membranes are moist.     Pharynx: No oropharyngeal exudate or posterior oropharyngeal erythema.  Eyes:     Conjunctiva/sclera: Conjunctivae normal.  Cardiovascular:     Rate and Rhythm: Normal rate and regular rhythm.     Heart sounds: Normal heart sounds. No murmur heard. Pulmonary:     Effort: Pulmonary effort is normal. No respiratory distress.     Breath sounds: Normal breath sounds. No wheezing, rhonchi or rales.  Skin:    General: Skin is warm and dry.  Neurological:     Mental Status: She is alert.  Psychiatric:        Mood and Affect: Mood normal.        Thought Content: Thought content normal.      UC Treatments / Results  Labs (all labs ordered are listed, but only abnormal results are displayed) Labs Reviewed  SARS CORONAVIRUS 2 (TAT 6-24 HRS)    EKG   Radiology No results found.  Procedures Procedures (including critical care time)  Medications Ordered in UC Medications - No data to display  Initial Impression / Assessment and Plan / UC Course  I have reviewed the triage vital signs and the nursing notes.  Pertinent labs & imaging results that were available during my care of the patient were reviewed by me and considered in my medical decision making (see chart for details).    I suspect most likely viral etiology but Patient currently immunocompromised due to chemotherapy-- will treat with antibiotic therapy and screen for COVID. Patient outside of treatment window for flu- testing deferred. Recommended she discuss cancer treatment tomorrow- provider may wish to postpone treatment with current illness- will defer this decision to specialist. Encouraged follow up with any further concerns.  Final Clinical Impressions(s) / UC Diagnoses   Final diagnoses:  Acute upper respiratory infection  Encounter for screening for  COVID-19  Immunocompromised state [facility])   Discharge Instructions   None    ED Prescriptions     Medication Sig Dispense Auth. Provider   promethazine-dextromethorphan (PROMETHAZINE-DM) 6.25-15 MG/5ML syrup Take 2.5 mLs by mouth 4 (four) times daily as needed for cough. 118 mL Ewell Poe F, PA-C   benzonatate (TESSALON) 100 MG capsule Take 1 capsule (100 mg total) by mouth every 8 (eight) hours. 21 capsule Ewell Poe F, PA-C   amoxicillin-clavulanate (AUGMENTIN) 875-125 MG tablet Take 1 tablet by mouth every 12 (twelve) hours. 14 tablet Francene Finders, PA-C      PDMP not reviewed this encounter.   Francene Finders, PA-C 10/03/22 [phone]

## 2022-10-03 NOTE — ED Triage Notes (Signed)
Pt presents to uc with co of chest congestion and cough with generalized body aches for 3 days, pt has been using otc medications.

## 2022-10-04 ENCOUNTER — Inpatient Hospital Stay (HOSPITAL_BASED_OUTPATIENT_CLINIC_OR_DEPARTMENT_OTHER): Payer: Medicare Other | Admitting: Hematology and Oncology

## 2022-10-04 ENCOUNTER — Other Ambulatory Visit: Payer: Self-pay

## 2022-10-04 ENCOUNTER — Inpatient Hospital Stay: Payer: Medicare Other

## 2022-10-04 ENCOUNTER — Inpatient Hospital Stay: Payer: Medicare Other | Attending: Hematology and Oncology

## 2022-10-04 VITALS — BP 164/75 | HR 82 | Temp 99.1°F | Resp 18 | Ht 66.0 in | Wt 148.4 lb

## 2022-10-04 DIAGNOSIS — Z9012 Acquired absence of left breast and nipple: Secondary | ICD-10-CM | POA: Diagnosis not present

## 2022-10-04 DIAGNOSIS — Z171 Estrogen receptor negative status [ER-]: Secondary | ICD-10-CM

## 2022-10-04 DIAGNOSIS — R197 Diarrhea, unspecified: Secondary | ICD-10-CM | POA: Insufficient documentation

## 2022-10-04 DIAGNOSIS — C50412 Malignant neoplasm of upper-outer quadrant of left female breast: Secondary | ICD-10-CM

## 2022-10-04 DIAGNOSIS — Z5112 Encounter for antineoplastic immunotherapy: Secondary | ICD-10-CM | POA: Diagnosis not present

## 2022-10-04 DIAGNOSIS — C7951 Secondary malignant neoplasm of bone: Secondary | ICD-10-CM | POA: Insufficient documentation

## 2022-10-04 DIAGNOSIS — Z923 Personal history of irradiation: Secondary | ICD-10-CM | POA: Diagnosis not present

## 2022-10-04 DIAGNOSIS — Z95828 Presence of other vascular implants and grafts: Secondary | ICD-10-CM

## 2022-10-04 LAB — CMP (CANCER CENTER ONLY)
ALT: 28 U/L (ref 0–44)
AST: 27 U/L (ref 15–41)
Albumin: 3.8 g/dL (ref 3.5–5.0)
Alkaline Phosphatase: 102 U/L (ref 38–126)
Anion gap: 5 (ref 5–15)
BUN: 6 mg/dL — ABNORMAL LOW (ref 8–23)
CO2: 28 mmol/L (ref 22–32)
Calcium: 8.7 mg/dL — ABNORMAL LOW (ref 8.9–10.3)
Chloride: 99 mmol/L (ref 98–111)
Creatinine: 0.81 mg/dL (ref 0.44–1.00)
GFR, Estimated: >60 mL/min (ref >60)
Glucose, Bld: 101 mg/dL — ABNORMAL HIGH (ref 70–99)
Potassium: 3.6 mmol/L (ref 3.5–5.1)
Sodium: 132 mmol/L — ABNORMAL LOW (ref 135–145)
Total Bilirubin: 0.5 mg/dL (ref 0.3–1.2)
Total Protein: 5.7 g/dL — ABNORMAL LOW (ref 6.5–8.1)

## 2022-10-04 LAB — SARS CORONAVIRUS 2 (TAT 6-24 HRS): SARS Coronavirus 2: NEGATIVE

## 2022-10-04 LAB — CBC WITH DIFFERENTIAL (CANCER CENTER ONLY)
Abs Immature Granulocytes: 0.02 10*3/uL (ref 0.00–0.07)
Basophils Absolute: 0.1 10*3/uL (ref 0.0–0.1)
Basophils Relative: 1 %
Eosinophils Absolute: 0.2 10*3/uL (ref 0.0–0.5)
Eosinophils Relative: 3 %
HCT: 32 % — ABNORMAL LOW (ref 36.0–46.0)
Hemoglobin: 10.7 g/dL — ABNORMAL LOW (ref 12.0–15.0)
Immature Granulocytes: 0 %
Lymphocytes Relative: 14 %
Lymphs Abs: 0.9 10*3/uL (ref 0.7–4.0)
MCH: 26.6 pg (ref 26.0–34.0)
MCHC: 33.4 g/dL (ref 30.0–36.0)
MCV: 79.4 fL — ABNORMAL LOW (ref 80.0–100.0)
Monocytes Absolute: 1 10*3/uL (ref 0.1–1.0)
Monocytes Relative: 15 %
Neutro Abs: 4.4 10*3/uL (ref 1.7–7.7)
Neutrophils Relative %: 67 %
Platelet Count: 212 10*3/uL (ref 150–400)
RBC: 4.03 MIL/uL (ref 3.87–5.11)
RDW: 13.3 % (ref 11.5–15.5)
WBC Count: 6.5 10*3/uL (ref 4.0–10.5)
nRBC: 0 % (ref 0.0–0.2)

## 2022-10-04 MED ORDER — SODIUM CHLORIDE 0.9% FLUSH
10.0000 mL | Freq: Once | INTRAVENOUS | Status: AC
  Administered 2022-10-04: 10 mL

## 2022-10-04 MED ORDER — AMOXICILLIN-POT CLAVULANATE 875-125 MG PO TABS: 1.0000 | ORAL_TABLET | Freq: Two times a day (BID) | ORAL | 0 refills | Status: DC

## 2022-10-04 NOTE — Assessment & Plan Note (Addendum)
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  T4 vertebral body Bone scan 04/25/2022: Abnormal uptake in the sternum, right parietal region of calvarium, right posterior seventh rib, T5, left sternoclavicular joint   Treatment: Palliative chemotherapy with Taxotere [name] and Perjeta every 3 weeks x1 cycle given 05/05/2022 (discontinued for severe toxicities and hospitalization)   ---------------------------------------------------------------------------------------------------------------------------------------------- Chemotoxicities: Hospitalization 05/11/2022-05/18/2022: Convulsive syncope, chemo induced neutropenia, oral mucositis, chronic CHF, severe diarrhea with dehydration ECHO 06/26/22: EF 55-60% (follows with Bensimhon)   Treatment plan:05/26/22 [name]. added Perjeta 06/21/22.  Upper respiratory infection with sinus congestion: Encouraged her to use supportive care measures and drink plenty of fluids and take Tylenol as needed.  We increased the number of days of antibiotic with Augmentin to 5 days.  I sent a new prescription today.  We will postpone her treatment by week

## 2022-10-04 NOTE — Progress Notes (Signed)
Patient Care Team: [name], MD as PCP - General (Family Medicine) Cameron Sprang, MD as Consulting Physician (Neurology) Mauro Kaufmann, RN as Oncology Nurse Navigator Rockwell Germany, RN as Oncology Nurse Navigator Coralie Keens, MD as Consulting Physician (General Surgery) Nicholas Lose, MD as Consulting Physician (Hematology and Oncology) Eppie Gibson, MD as Attending Physician (Radiation Oncology)  DIAGNOSIS:  Encounter Diagnosis  Name Primary?   Malignant neoplasm of upper-outer quadrant of left breast in female, estrogen receptor negative ([place]) Yes    SUMMARY OF ONCOLOGIC HISTORY: Oncology History  Malignant neoplasm of upper-outer quadrant of left breast in female, estrogen receptor negative ([place])  02/10/2022 Initial Diagnosis   Palpable left breast masses and calcifications: 1.8 cm at 1:00 and 1.7 cm at 9:00, calcifications not biopsy.  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 Report date is 03/05/2022.  The CustomNext-Cancer+RNAinsight panel offered by [facility] includes sequencing and rearrangement analysis for the following 48 genes:  APC, ATM, AXIN2, BARD1, BMPR1A, BRCA1, BRCA2, BRIP1, CDH1, CDK4, CDKN2A, CHEK2, CTNNA1, DICER1, EGFR, EPCAM, GREM1, HOXB13, KIT, MEN1, MLH1, MSH2, MSH3, MSH6, MUTYH, NBN, NF1, NTHL1, PALB2, PDGFRA, PMS2, POLD1, POLE, PTEN, RAD50, RAD51C, RAD51D, SDHA, SDHB, SDHC, SDHD, SMAD4, SMARCA4, STK11, TP53, TSC1, TSC2, and VHL.  RNA data is routinely analyzed for use in variant interpretation for all genes.   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, LCIS, 5/5 lymph nodes positive with extranodal extension, margins negative ER 0%, PR 0%, HER2 3+, Ki-67 20%   04/27/2022 Cancer Staging   Staging form: Breast, AJCC 8th Edition - Pathologic: Stage IIIA (pT2, pN2, cM0, G2, ER-, PR-, HER2+) - Signed by Nicholas Lose, MD on 04/27/2022 Histologic grading system: 3 grade system   05/05/2022 - 05/26/2022 Chemotherapy   Patient is on Treatment Plan : BREAST DOCEtaxel + Trastuzumab + Pertuzumab (THP) q21d x 8 cycles / Trastuzumab + Pertuzumab q21d x 4 cycles     05/26/2022 -  Chemotherapy   Patient is on Treatment Plan : BREAST MAINTENANCE Trastuzumab IV (6) or SQ (600) D1 q21d X 11 Cycles       CHIEF COMPLIANT: Follow up herceptin and Perjeta  INTERVAL HISTORY: Allison Pierce is a 77 y.o with the above-mentioned metastatic breast cancer currently on herceptin perjeta.  Last week we did not give her treatment because she was having arrest respiratory infection symptoms.  We give her antibiotics and today she is back to discuss resuming treatment.  She feels significantly better and antibiotics appear to have worked.  She is ready to reinitiate her treatment.   ALLERGIES:  is allergic to gadolinium derivatives, atorvastatin calcium, azelaic acid, oxybutynin, prozac [fluoxetine hcl], ritalin [methylphenidate], rosuvastatin, and lyrica [pregabalin].  MEDICATIONS:  Current Outpatient Medications  Medication Sig Dispense Refill   amoxicillin-clavulanate (AUGMENTIN) 875-125 MG tablet Take 1 tablet by mouth 2 (two) times daily. 8 tablet 0   B Complex-C (B COMPLEX-VITAMIN C) CAPS Take 1 capsule by mouth daily.     benzonatate (TESSALON) 100 MG capsule Take 1 capsule (100 mg total) by mouth  every 8 (eight) hours. 21 capsule 0   buPROPion (WELLBUTRIN XL) 300 MG 24 hr tablet Take 300 mg by mouth every morning.     calcium carbonate (TUMS - DOSED IN MG ELEMENTAL CALCIUM) 500 MG chewable  tablet Chew 2 tablets by mouth daily as needed for indigestion or heartburn.     cholecalciferol (VITAMIN D3) 25 MCG (1000 UNIT) tablet Take 1,000 Units by mouth daily.     clonazePAM (KLONOPIN) 1 MG tablet Take 1 mg by mouth at bedtime.     Cyanocobalamin (VITAMIN B-12) 5000 MCG TBDP Take 5,000 Units by mouth daily.     diphenhydrAMINE (BENADRYL) 25 MG tablet Take 25 mg by mouth in the morning.     fluticasone (FLONASE) 50 MCG/ACT nasal spray Place 1 spray into both nostrils daily as needed for allergies.     losartan (COZAAR) 25 MG tablet Take 50 mg by mouth daily.     meloxicam (MOBIC) 15 MG tablet Take 15 mg by mouth daily.     metoprolol succinate (TOPROL XL) 25 MG 24 hr tablet Take 1 tablet (25 mg total) by mouth daily. 90 tablet 3   Potassium 99 MG TABS Take 99 mg by mouth at bedtime.     promethazine-dextromethorphan (PROMETHAZINE-DM) 6.25-15 MG/5ML syrup Take 2.5 mLs by mouth 4 (four) times daily as needed for cough. 118 mL 0   traZODone (DESYREL) 150 MG tablet Take 225 mg by mouth at bedtime.     venlafaxine XR (EFFEXOR-XR) 75 MG 24 hr capsule Take 75 mg by mouth daily with breakfast.     No current facility-administered medications for this visit.    PHYSICAL EXAMINATION: ECOG PERFORMANCE STATUS: 1 - Symptomatic but completely ambulatory  Vitals:   10/12/22 1002  BP: (!) 162/69  Pulse: 76  Temp: 98.1 F (36.7 C)  SpO2: 98%   Filed Weights   10/12/22 1002  Weight: 145 lb 8 oz (66 kg)      LABORATORY DATA:  I have reviewed the data as listed    Latest Ref Rng & Units 10/11/2022    2:12 PM 10/04/2022    8:48 AM 09/13/2022    9:33 AM  CMP  Glucose 70 - 99 mg/dL 108  101  113   BUN 8 - 23 mg/dL _0 Creatinine 0.44 - 1.00 mg/dL 0.91  0.81  0.90   Sodium 135 - 145 mmol/L 138  132  137   Potassium 3.5 - 5.1 mmol/L 3.7  3.6  3.6   Chloride 98 - 111 mmol/L 106  99  105   CO2 22 - 32 mmol/L _1 Calcium 8.9 - 10.3 mg/dL 8.5  8.7  8.8   Total Protein 6.5 -  8.1 g/dL 6.0  5.7  5.3   Total Bilirubin 0.3 - 1.2 mg/dL 0.5  0.5  0.3   Alkaline Phos 38 - 126 U/L 96  102  84   AST 15 - 41 U/L _2 ALT 0 - 44 U/L 35  28  15     Lab Results  Component Value Date   WBC 8.4 10/11/2022   HGB 11.2 (L) 10/11/2022   HCT 33.2 (L) 10/11/2022   MCV 79.8 (L) 10/11/2022   PLT 288 10/11/2022   NEUTROABS 6.3 10/11/2022    ASSESSMENT & PLAN:  Malignant neoplasm of upper-outer quadrant of left breast in female, estrogen receptor negative ([place]) 04/07/2022:Left mastectomy:  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  T4 vertebral body Bone scan 04/25/2022: Abnormal uptake in the sternum, right parietal region of calvarium, right posterior seventh rib, T5, left sternoclavicular joint   Treatment: Palliative chemotherapy with Taxotere [name] and Perjeta every 3 weeks x1 cycle given 05/05/2022 (discontinued for severe toxicities and hospitalization)   ---------------------------------------------------------------------------------------------------------------------------------------------- Chemotoxicities: Hospitalization 05/11/2022-05/18/2022: Convulsive syncope, chemo induced neutropenia, oral mucositis, chronic CHF, severe diarrhea with dehydration ECHO 06/26/22: EF 55-60% (follows with Bensimhon)   Treatment plan:05/26/22 [name]. added Perjeta 06/21/22. Upper respiratory tract infection with sinus congestion: Treated with antibiotics  Resuming [name] and Perjeta today.  07/30/2022: Bone scan: Interval resolution/decreasing accumulation of multiple bone metastases  Recommend a CT CAP and follow-up in 3 weeks    No orders of the defined types were placed in this encounter.  The patient has a good  understanding of the overall plan. she agrees with it. she will call with any problems that may develop before the next visit here. Total time spent: 30 mins including face to face time and time spent for planning, charting and co-ordination of care   Harriette Ohara, MD 10/12/22    I Gardiner Coins am acting as a Education administrator for [employer]  I have reviewed the above documentation for accuracy and completeness, and I agree with the above.

## 2022-10-04 NOTE — Progress Notes (Signed)
Patient Care Team: [name], MD as PCP - General (Family Medicine) Cameron Sprang, MD as Consulting Physician (Neurology) Mauro Kaufmann, RN as Oncology Nurse Navigator Rockwell Germany, RN as Oncology Nurse Navigator Coralie Keens, MD as Consulting Physician (General Surgery) Nicholas Lose, MD as Consulting Physician (Hematology and Oncology) Eppie Gibson, MD as Attending Physician (Radiation Oncology)  DIAGNOSIS:  Encounter Diagnosis  Name Primary?   Malignant neoplasm of upper-outer quadrant of left breast in female, estrogen receptor negative ([place]) Yes    SUMMARY OF ONCOLOGIC HISTORY: Oncology History  Malignant neoplasm of upper-outer quadrant of left breast in female, estrogen receptor negative ([place])  02/10/2022 Initial Diagnosis   Palpable left breast masses and calcifications: 1.8 cm at 1:00 and 1.7 cm at 9:00, calcifications not biopsy.  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 Report date is 03/05/2022.  The CustomNext-Cancer+RNAinsight panel offered by [facility] includes sequencing and rearrangement analysis for the following 48 genes:  APC, ATM, AXIN2, BARD1, BMPR1A, BRCA1, BRCA2, BRIP1, CDH1, CDK4, CDKN2A, CHEK2, CTNNA1, DICER1, EGFR, EPCAM, GREM1, HOXB13, KIT, MEN1, MLH1, MSH2, MSH3, MSH6, MUTYH, NBN, NF1, NTHL1, PALB2, PDGFRA, PMS2, POLD1, POLE, PTEN, RAD50, RAD51C, RAD51D, SDHA, SDHB, SDHC, SDHD, SMAD4, SMARCA4, STK11, TP53, TSC1, TSC2, and VHL.  RNA data is routinely analyzed for use in variant interpretation for all genes.   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, LCIS, 5/5 lymph nodes positive with extranodal extension, margins negative ER 0%, PR 0%, HER2 3+, Ki-67 20%   04/27/2022 Cancer Staging   Staging form: Breast, AJCC 8th Edition - Pathologic: Stage IIIA (pT2, pN2, cM0, G2, ER-, PR-, HER2+) - Signed by Nicholas Lose, MD on 04/27/2022 Histologic grading system: 3 grade system   05/05/2022 - 05/26/2022 Chemotherapy   Patient is on Treatment Plan : BREAST DOCEtaxel + Trastuzumab + Pertuzumab (THP) q21d x 8 cycles / Trastuzumab + Pertuzumab q21d x 4 cycles     05/26/2022 -  Chemotherapy   Patient is on Treatment Plan : BREAST MAINTENANCE Trastuzumab IV (6) or SQ (600) D1 q21d X 11 Cycles       CHIEF COMPLIANT: Ed/fu cough/congested: Urgent visit  INTERVAL HISTORY: Allison Pierce is a 77 y.o with the above-mentioned metastatic breast cancer currently on herceptin perjeta. She presents to the clinic today for a hospital follow-up. She reports that she went to the emergency room because she felt congested and jittery. She says she is itching all over. Her husband initially guarded the infection and subsequently she developed it on January 1.   ALLERGIES:  is allergic to gadolinium derivatives, atorvastatin calcium, azelaic acid, oxybutynin, prozac [fluoxetine hcl], ritalin [methylphenidate], rosuvastatin, and lyrica [pregabalin].  MEDICATIONS:  Current Outpatient Medications  Medication Sig Dispense Refill   amoxicillin-clavulanate (AUGMENTIN) 875-125 MG tablet Take 1 tablet by mouth 2 (two) times daily. 8 tablet 0   B Complex-C (B COMPLEX-VITAMIN C) CAPS Take 1 capsule by mouth daily.     benzonatate (TESSALON) 100 MG capsule Take 1 capsule (100 mg total) by mouth every 8 (eight) hours. 21  capsule 0   buPROPion (WELLBUTRIN XL) 300 MG 24 hr tablet Take 300 mg by mouth every morning.     calcium carbonate (TUMS - DOSED IN MG ELEMENTAL CALCIUM) 500 MG chewable tablet Chew 2  tablets by mouth daily as needed for indigestion or heartburn.     cholecalciferol (VITAMIN D3) 25 MCG (1000 UNIT) tablet Take 1,000 Units by mouth daily.     clonazePAM (KLONOPIN) 1 MG tablet Take 1 mg by mouth at bedtime.     Cyanocobalamin (VITAMIN B-12) 5000 MCG TBDP Take 5,000 Units by mouth daily.     diphenhydrAMINE (BENADRYL) 25 MG tablet Take 25 mg by mouth in the morning.     fluticasone (FLONASE) 50 MCG/ACT nasal spray Place 1 spray into both nostrils daily as needed for allergies.     losartan (COZAAR) 25 MG tablet Take 50 mg by mouth daily.     meloxicam (MOBIC) 15 MG tablet Take 15 mg by mouth daily.     metoprolol succinate (TOPROL XL) 25 MG 24 hr tablet Take 1 tablet (25 mg total) by mouth daily. 90 tablet 3   Potassium 99 MG TABS Take 99 mg by mouth at bedtime.     promethazine-dextromethorphan (PROMETHAZINE-DM) 6.25-15 MG/5ML syrup Take 2.5 mLs by mouth 4 (four) times daily as needed for cough. 118 mL 0   traZODone (DESYREL) 150 MG tablet Take 225 mg by mouth at bedtime.     venlafaxine XR (EFFEXOR-XR) 75 MG 24 hr capsule Take 75 mg by mouth daily with breakfast.     No current facility-administered medications for this visit.    PHYSICAL EXAMINATION: ECOG PERFORMANCE STATUS: 1 - Symptomatic but completely ambulatory  Vitals:   10/04/22 0925  BP: (!) 164/75  Pulse: 82  Resp: 18  Temp: 99.1 F (37.3 C)  SpO2: 100%   Filed Weights   10/04/22 0925  Weight: 148 lb 6.4 oz (67.3 kg)   Lung exam: No crackles or wheezes  LABORATORY DATA:  I have reviewed the data as listed    Latest Ref Rng & Units 10/04/2022    8:48 AM 09/13/2022    9:33 AM 08/23/2022    8:08 AM  CMP  Glucose 70 - 99 mg/dL 101  113  113   BUN 8 - 23 mg/dL _0 Creatinine 0.44 - 1.00 mg/dL 0.81  0.90  1.01   Sodium 135 - 145 mmol/L 132  137  139   Potassium 3.5 - 5.1 mmol/L 3.6  3.6  3.8   Chloride 98 - 111 mmol/L 99  105  107   CO2 22 - 32 mmol/L _1 Calcium 8.9 - 10.3 mg/dL  8.7  8.8  9.0   Total Protein 6.5 - 8.1 g/dL 5.7  5.3  5.9   Total Bilirubin 0.3 - 1.2 mg/dL 0.5  0.3  0.4   Alkaline Phos 38 - 126 U/L 102  84  76   AST 15 - 41 U/L _2 ALT 0 - 44 U/L _3 Lab Results  Component Value Date   WBC 6.5 10/04/2022   HGB 10.7 (L) 10/04/2022   HCT 32.0 (L) 10/04/2022   MCV 79.4 (L) 10/04/2022   PLT 212 10/04/2022   NEUTROABS 4.4 10/04/2022    ASSESSMENT & PLAN:  Malignant neoplasm of upper-outer quadrant of left breast in female, estrogen receptor negative ([place])  04/07/2022:Left mastectomy: 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  T4 vertebral body Bone scan 04/25/2022: Abnormal uptake in the sternum, right parietal region of calvarium, right posterior seventh rib, T5, left sternoclavicular joint   Treatment: Palliative chemotherapy with Taxotere [name] and Perjeta every 3 weeks x1 cycle given 05/05/2022 (discontinued for severe toxicities and hospitalization)   ---------------------------------------------------------------------------------------------------------------------------------------------- Chemotoxicities: Hospitalization 05/11/2022-05/18/2022: Convulsive syncope, chemo induced neutropenia, oral mucositis, chronic CHF, severe diarrhea with dehydration ECHO 06/26/22: EF 55-60% (follows with [facility])   Treatment plan:05/26/22 [name]. added Perjeta 06/21/22.  Upper respiratory infection with sinus congestion: Encouraged her to use supportive care measures and drink plenty of fluids and take Tylenol as needed.  We increased the number of days of antibiotic with Augmentin to 5 days.  I sent a new prescription today.  We will postpone her treatment by week    No orders of the defined  types were placed in this encounter.  The patient has a good understanding of the overall plan. she agrees with it. she will call with any problems that may develop before the next visit here. Total time spent: 30 mins including face to face time and time spent for planning, charting and co-ordination of care   Harriette Ohara, MD 10/04/22    I Gardiner Coins am acting as a Education administrator for [employer]  I have reviewed the above documentation for accuracy and completeness, and I agree with the above.

## 2022-10-05 ENCOUNTER — Other Ambulatory Visit: Payer: Self-pay

## 2022-10-11 ENCOUNTER — Inpatient Hospital Stay: Payer: Medicare Other

## 2022-10-11 DIAGNOSIS — Z9012 Acquired absence of left breast and nipple: Secondary | ICD-10-CM | POA: Diagnosis not present

## 2022-10-11 DIAGNOSIS — C50412 Malignant neoplasm of upper-outer quadrant of left female breast: Secondary | ICD-10-CM | POA: Diagnosis not present

## 2022-10-11 DIAGNOSIS — Z5112 Encounter for antineoplastic immunotherapy: Secondary | ICD-10-CM | POA: Diagnosis not present

## 2022-10-11 DIAGNOSIS — Z95828 Presence of other vascular implants and grafts: Secondary | ICD-10-CM

## 2022-10-11 DIAGNOSIS — R197 Diarrhea, unspecified: Secondary | ICD-10-CM | POA: Diagnosis not present

## 2022-10-11 DIAGNOSIS — C7951 Secondary malignant neoplasm of bone: Secondary | ICD-10-CM | POA: Diagnosis not present

## 2022-10-11 DIAGNOSIS — Z171 Estrogen receptor negative status [ER-]: Secondary | ICD-10-CM

## 2022-10-11 LAB — CMP (CANCER CENTER ONLY)
ALT: 35 U/L (ref 0–44)
AST: 25 U/L (ref 15–41)
Albumin: 3.5 g/dL (ref 3.5–5.0)
Alkaline Phosphatase: 96 U/L (ref 38–126)
Anion gap: 6 (ref 5–15)
BUN: 9 mg/dL (ref 8–23)
CO2: 26 mmol/L (ref 22–32)
Calcium: 8.5 mg/dL — ABNORMAL LOW (ref 8.9–10.3)
Chloride: 106 mmol/L (ref 98–111)
Creatinine: 0.91 mg/dL (ref 0.44–1.00)
GFR, Estimated: >60 mL/min (ref >60)
Glucose, Bld: 108 mg/dL — ABNORMAL HIGH (ref 70–99)
Potassium: 3.7 mmol/L (ref 3.5–5.1)
Sodium: 138 mmol/L (ref 135–145)
Total Bilirubin: 0.5 mg/dL (ref 0.3–1.2)
Total Protein: 6 g/dL — ABNORMAL LOW (ref 6.5–8.1)

## 2022-10-11 LAB — CBC WITH DIFFERENTIAL (CANCER CENTER ONLY)
Abs Immature Granulocytes: 0.02 10*3/uL (ref 0.00–0.07)
Basophils Absolute: 0 10*3/uL (ref 0.0–0.1)
Basophils Relative: 0 %
Eosinophils Absolute: 0.2 10*3/uL (ref 0.0–0.5)
Eosinophils Relative: 2 %
HCT: 33.2 % — ABNORMAL LOW (ref 36.0–46.0)
Hemoglobin: 11.2 g/dL — ABNORMAL LOW (ref 12.0–15.0)
Immature Granulocytes: 0 %
Lymphocytes Relative: 14 %
Lymphs Abs: 1.2 10*3/uL (ref 0.7–4.0)
MCH: 26.9 pg (ref 26.0–34.0)
MCHC: 33.7 g/dL (ref 30.0–36.0)
MCV: 79.8 fL — ABNORMAL LOW (ref 80.0–100.0)
Monocytes Absolute: 0.7 10*3/uL (ref 0.1–1.0)
Monocytes Relative: 8 %
Neutro Abs: 6.3 10*3/uL (ref 1.7–7.7)
Neutrophils Relative %: 76 %
Platelet Count: 288 10*3/uL (ref 150–400)
RBC: 4.16 MIL/uL (ref 3.87–5.11)
RDW: 13.3 % (ref 11.5–15.5)
WBC Count: 8.4 10*3/uL (ref 4.0–10.5)
nRBC: 0 % (ref 0.0–0.2)

## 2022-10-11 MED ORDER — SODIUM CHLORIDE 0.9% FLUSH
10.0000 mL | Freq: Once | INTRAVENOUS | Status: AC
  Administered 2022-10-11: 10 mL

## 2022-10-12 ENCOUNTER — Inpatient Hospital Stay (HOSPITAL_BASED_OUTPATIENT_CLINIC_OR_DEPARTMENT_OTHER): Payer: Medicare Other | Admitting: Hematology and Oncology

## 2022-10-12 ENCOUNTER — Inpatient Hospital Stay: Payer: Medicare Other

## 2022-10-12 VITALS — BP 166/77 | HR 73 | Resp 16

## 2022-10-12 VITALS — BP 162/69 | HR 76 | Temp 98.1°F | Wt 145.5 lb

## 2022-10-12 DIAGNOSIS — C50412 Malignant neoplasm of upper-outer quadrant of left female breast: Secondary | ICD-10-CM | POA: Diagnosis not present

## 2022-10-12 DIAGNOSIS — Z9012 Acquired absence of left breast and nipple: Secondary | ICD-10-CM | POA: Diagnosis not present

## 2022-10-12 DIAGNOSIS — C7951 Secondary malignant neoplasm of bone: Secondary | ICD-10-CM | POA: Diagnosis not present

## 2022-10-12 DIAGNOSIS — R197 Diarrhea, unspecified: Secondary | ICD-10-CM | POA: Diagnosis not present

## 2022-10-12 DIAGNOSIS — Z171 Estrogen receptor negative status [ER-]: Secondary | ICD-10-CM

## 2022-10-12 DIAGNOSIS — Z5112 Encounter for antineoplastic immunotherapy: Secondary | ICD-10-CM | POA: Diagnosis not present

## 2022-10-12 MED ORDER — SODIUM CHLORIDE 0.9 % IV SOLN: Freq: Once | INTRAVENOUS | Status: AC

## 2022-10-12 MED ORDER — HEPARIN SOD (PORK) LOCK FLUSH 100 UNIT/ML IV SOLN: 500.0000 [IU] | Freq: Once | INTRAVENOUS | Status: DC | PRN

## 2022-10-12 MED ORDER — DIPHENHYDRAMINE HCL 25 MG PO CAPS
25.0000 mg | ORAL_CAPSULE | Freq: Once | ORAL | Status: AC
  Administered 2022-10-12: 25 mg via ORAL
  Filled 2022-10-12: qty 1

## 2022-10-12 MED ORDER — SODIUM CHLORIDE 0.9% FLUSH: 10.0000 mL | INTRAVENOUS | Status: DC | PRN

## 2022-10-12 MED ORDER — TRASTUZUMAB-DKST CHEMO 150 MG IV SOLR
6.0000 mg/kg | Freq: Once | INTRAVENOUS | Status: AC
  Administered 2022-10-12: 420 mg via INTRAVENOUS
  Filled 2022-10-12: qty 20

## 2022-10-12 MED ORDER — ALTEPLASE 2 MG IJ SOLR
2.0000 mg | Freq: Once | INTRAMUSCULAR | Status: AC | PRN
  Administered 2022-10-12: 2 mg
  Filled 2022-10-12: qty 2

## 2022-10-12 MED ORDER — SODIUM CHLORIDE 0.9 % IV SOLN
420.0000 mg | Freq: Once | INTRAVENOUS | Status: AC
  Administered 2022-10-12: 420 mg via INTRAVENOUS
  Filled 2022-10-12: qty 14

## 2022-10-12 MED ORDER — ACETAMINOPHEN 325 MG PO TABS
650.0000 mg | ORAL_TABLET | Freq: Once | ORAL | Status: AC
  Administered 2022-10-12: 650 mg via ORAL
  Filled 2022-10-12: qty 2

## 2022-10-12 NOTE — Assessment & Plan Note (Signed)
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  T4 vertebral body Bone scan 04/25/2022: Abnormal uptake in the sternum, right parietal region of calvarium, right posterior seventh rib, T5, left sternoclavicular joint   Treatment: Palliative chemotherapy with Taxotere [name] and Perjeta every 3 weeks x1 cycle given 05/05/2022 (discontinued for severe toxicities and hospitalization)   ---------------------------------------------------------------------------------------------------------------------------------------------- Chemotoxicities: Hospitalization 05/11/2022-05/18/2022: Convulsive syncope, chemo induced neutropenia, oral mucositis, chronic CHF, severe diarrhea with dehydration ECHO 06/26/22: EF 55-60% (follows with [facility])   Treatment plan:05/26/22 [name]. added Perjeta 06/21/22. Upper respiratory tract infection with sinus congestion: Treated with antibiotics  Resuming [name] and Perjeta today.  07/30/2022: Bone scan: Interval resolution/decreasing accumulation of multiple bone metastases  Recommend a CT CAP and follow-up in 3 weeks

## 2022-10-12 NOTE — Progress Notes (Signed)
No reloading doses needed today per Dr. Lindi Adie.  Kennith Center, Pharm.D., CPP 10/12/2022'@11'$ :05 AM

## 2022-10-12 NOTE — Patient Instructions (Signed)
[facility]  Discharge Instructions: Thank you for choosing [facility] to provide your oncology and hematology care.   If you have a lab appointment with the [facility], please go directly to the [facility] and check in at the registration area.   Wear comfortable clothing and clothing appropriate for easy access to any Portacath or PICC line.   We strive to give you quality time with your provider. You may need to reschedule your appointment if you arrive late (15 or more minutes).  Arriving late affects you and other patients whose appointments are after yours.  Also, if you miss three or more appointments without notifying the office, you may be dismissed from the clinic at the provider's discretion.      For prescription refill requests, have your pharmacy contact our office and allow 72 hours for refills to be completed.    Today you received the following chemotherapy and/or immunotherapy agents : Trastuzumab, Pertuzumab      To help prevent nausea and vomiting after your treatment, we encourage you to take your nausea medication as directed.  BELOW ARE SYMPTOMS THAT SHOULD BE REPORTED IMMEDIATELY: *FEVER GREATER THAN 100.4 F (38 C) OR HIGHER *CHILLS OR SWEATING *NAUSEA AND VOMITING THAT IS NOT CONTROLLED WITH YOUR NAUSEA MEDICATION *UNUSUAL SHORTNESS OF BREATH *UNUSUAL BRUISING OR BLEEDING *URINARY PROBLEMS (pain or burning when urinating, or frequent urination) *BOWEL PROBLEMS (unusual diarrhea, constipation, pain near the anus) TENDERNESS IN MOUTH AND THROAT WITH OR WITHOUT PRESENCE OF ULCERS (sore throat, sores in mouth, or a toothache) UNUSUAL RASH, SWELLING OR PAIN  UNUSUAL VAGINAL DISCHARGE OR ITCHING   Items with * indicate a potential emergency and should be followed up as soon as possible or go to the Emergency Department if any problems should occur.  Please show the CHEMOTHERAPY ALERT CARD or IMMUNOTHERAPY ALERT CARD  at check-in to the Emergency Department and triage nurse.  Should you have questions after your visit or need to cancel or reschedule your appointment, please contact [facility]  Dept: [phone]  and follow the prompts.  Office hours are 8:00 a.m. to 4:30 p.m. Monday - Friday. Please note that voicemails left after 4:00 p.m. may not be returned until the following business day.  We are closed weekends and major holidays. You have access to a nurse at all times for urgent questions. Please call the main number to the clinic Dept: [phone] and follow the prompts.   For any non-urgent questions, you may also contact your provider using MyChart. We now offer e-Visits for anyone 54 and older to request care online for non-urgent symptoms. For details visit [email or web address].   Also download the MyChart app! Go to the app store, search "MyChart", open the app, select [facility], and log in with your MyChart username and password.

## 2022-10-13 ENCOUNTER — Other Ambulatory Visit: Payer: Self-pay

## 2022-10-30 ENCOUNTER — Encounter (HOSPITAL_COMMUNITY): Payer: Self-pay

## 2022-10-30 ENCOUNTER — Ambulatory Visit (HOSPITAL_COMMUNITY)
Admission: RE | Admit: 2022-10-30 | Discharge: 2022-10-30 | Disposition: A | Discharge status: Home / Self Care | Payer: Medicare Other | Source: Ambulatory Visit | Attending: Hematology and Oncology | Admitting: Hematology and Oncology

## 2022-10-30 DIAGNOSIS — Z171 Estrogen receptor negative status [ER-]: Secondary | ICD-10-CM | POA: Diagnosis not present

## 2022-10-30 DIAGNOSIS — C50919 Malignant neoplasm of unspecified site of unspecified female breast: Secondary | ICD-10-CM | POA: Diagnosis not present

## 2022-10-30 DIAGNOSIS — C50412 Malignant neoplasm of upper-outer quadrant of left female breast: Secondary | ICD-10-CM | POA: Insufficient documentation

## 2022-10-30 MED ORDER — IOHEXOL 300 MG/ML  SOLN
100.0000 mL | Freq: Once | INTRAMUSCULAR | Status: AC | PRN
  Administered 2022-10-30: 100 mL via INTRAVENOUS

## 2022-10-30 MED ORDER — SODIUM CHLORIDE (PF) 0.9 % IJ SOLN
INTRAMUSCULAR | Status: AC
  Filled 2022-10-30: qty 50

## 2022-11-01 ENCOUNTER — Inpatient Hospital Stay: Payer: Medicare Other

## 2022-11-01 ENCOUNTER — Other Ambulatory Visit: Payer: Self-pay

## 2022-11-01 ENCOUNTER — Inpatient Hospital Stay (HOSPITAL_BASED_OUTPATIENT_CLINIC_OR_DEPARTMENT_OTHER): Payer: Medicare Other | Admitting: Hematology and Oncology

## 2022-11-01 VITALS — BP 140/77 | HR 81 | Temp 97.7°F | Resp 15 | Wt 145.3 lb

## 2022-11-01 VITALS — BP 148/88 | HR 80 | Temp 98.0°F | Resp 17

## 2022-11-01 DIAGNOSIS — R197 Diarrhea, unspecified: Secondary | ICD-10-CM | POA: Diagnosis not present

## 2022-11-01 DIAGNOSIS — Z171 Estrogen receptor negative status [ER-]: Secondary | ICD-10-CM | POA: Diagnosis not present

## 2022-11-01 DIAGNOSIS — C50412 Malignant neoplasm of upper-outer quadrant of left female breast: Secondary | ICD-10-CM

## 2022-11-01 DIAGNOSIS — C7951 Secondary malignant neoplasm of bone: Secondary | ICD-10-CM | POA: Diagnosis not present

## 2022-11-01 DIAGNOSIS — Z9012 Acquired absence of left breast and nipple: Secondary | ICD-10-CM | POA: Diagnosis not present

## 2022-11-01 DIAGNOSIS — Z5112 Encounter for antineoplastic immunotherapy: Secondary | ICD-10-CM | POA: Diagnosis not present

## 2022-11-01 MED ORDER — SODIUM CHLORIDE 0.9 % IV SOLN: Freq: Once | INTRAVENOUS | Status: AC

## 2022-11-01 MED ORDER — DIPHENHYDRAMINE HCL 25 MG PO CAPS
25.0000 mg | ORAL_CAPSULE | Freq: Once | ORAL | Status: AC
  Administered 2022-11-01: 25 mg via ORAL
  Filled 2022-11-01: qty 1

## 2022-11-01 MED ORDER — ACETAMINOPHEN 325 MG PO TABS
650.0000 mg | ORAL_TABLET | Freq: Once | ORAL | Status: AC
  Administered 2022-11-01: 650 mg via ORAL
  Filled 2022-11-01: qty 2

## 2022-11-01 MED ORDER — SODIUM CHLORIDE 0.9 % IV SOLN
420.0000 mg | Freq: Once | INTRAVENOUS | Status: AC
  Administered 2022-11-01: 420 mg via INTRAVENOUS
  Filled 2022-11-01: qty 14

## 2022-11-01 MED ORDER — TRASTUZUMAB-DKST CHEMO 150 MG IV SOLR
6.0000 mg/kg | Freq: Once | INTRAVENOUS | Status: AC
  Administered 2022-11-01: 420 mg via INTRAVENOUS
  Filled 2022-11-01: qty 20

## 2022-11-01 MED ORDER — SODIUM CHLORIDE 0.9% FLUSH
10.0000 mL | INTRAVENOUS | Status: DC | PRN
  Administered 2022-11-01: 10 mL

## 2022-11-01 MED ORDER — HEPARIN SOD (PORK) LOCK FLUSH 100 UNIT/ML IV SOLN
500.0000 [IU] | Freq: Once | INTRAVENOUS | Status: AC | PRN
  Administered 2022-11-01: 500 [IU]

## 2022-11-01 NOTE — Assessment & Plan Note (Signed)
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  T4 vertebral body Bone scan 04/25/2022: Abnormal uptake in the sternum, right parietal region of calvarium, right posterior seventh rib, T5, left sternoclavicular joint   Treatment: Palliative chemotherapy with Taxotere [name] and Perjeta every 3 weeks x1 cycle given 05/05/2022 (discontinued for severe toxicities and hospitalization)   ---------------------------------------------------------------------------------------------------------------------------------------------- Chemotoxicities: Hospitalization 05/11/2022-05/18/2022: Convulsive syncope, chemo induced neutropenia, oral mucositis, chronic CHF, severe diarrhea with dehydration ECHO 06/26/22: EF 55-60% (follows with [facility])   Treatment plan:05/26/22 [name]. added Perjeta 06/21/22.   07/30/2022: Bone scan: Interval resolution/decreasing accumulation of multiple bone metastases 10/30/2021: CT CAP: Mildly prominent mediastinal lymph nodes are similar, no pulmonary metastasis, skeletal lesions increasing sclerosis, no visceral metastases  Radiology review: Patient is continuing to respond well to treatment. Continue the same treatment plan. Return every 3 weeks for [name] and Perjeta.

## 2022-11-01 NOTE — Progress Notes (Signed)
Patient Care Team: [name], MD as PCP - General (Family Medicine) Cameron Sprang, MD as Consulting Physician (Neurology) Mauro Kaufmann, RN as Oncology Nurse Navigator Rockwell Germany, RN as Oncology Nurse Navigator Coralie Keens, MD as Consulting Physician (General Surgery) Nicholas Lose, MD as Consulting Physician (Hematology and Oncology) Eppie Gibson, MD as Attending Physician (Radiation Oncology)  DIAGNOSIS:  Encounter Diagnosis  Name Primary?   Malignant neoplasm of upper-outer quadrant of left breast in female, estrogen receptor negative ([place]) Yes    SUMMARY OF ONCOLOGIC HISTORY: Oncology History  Malignant neoplasm of upper-outer quadrant of left breast in female, estrogen receptor negative ([place])  02/10/2022 Initial Diagnosis   Palpable left breast masses and calcifications: 1.8 cm at 1:00 and 1.7 cm at 9:00, calcifications not biopsy.  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 Report date is 03/05/2022.  The CustomNext-Cancer+RNAinsight panel offered by [facility] includes sequencing and rearrangement analysis for the following 48 genes:  APC, ATM, AXIN2, BARD1, BMPR1A, BRCA1, BRCA2, BRIP1, CDH1, CDK4, CDKN2A, CHEK2, CTNNA1, DICER1, EGFR, EPCAM, GREM1, HOXB13, KIT, MEN1, MLH1, MSH2, MSH3, MSH6, MUTYH, NBN, NF1, NTHL1, PALB2, PDGFRA, PMS2, POLD1, POLE, PTEN, RAD50, RAD51C, RAD51D, SDHA, SDHB, SDHC, SDHD, SMAD4, SMARCA4, STK11, TP53, TSC1, TSC2, and VHL.  RNA data is routinely analyzed for use in variant interpretation for all genes.   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, LCIS, 5/5 lymph nodes positive with extranodal extension, margins negative ER 0%, PR 0%, HER2 3+, Ki-67 20%   04/27/2022 Cancer Staging   Staging form: Breast, AJCC 8th Edition - Pathologic: Stage IIIA (pT2, pN2, cM0, G2, ER-, PR-, HER2+) - Signed by Nicholas Lose, MD on 04/27/2022 Histologic grading system: 3 grade system   05/05/2022 - 05/26/2022 Chemotherapy   Patient is on Treatment Plan : BREAST DOCEtaxel + Trastuzumab + Pertuzumab (THP) q21d x 8 cycles / Trastuzumab + Pertuzumab q21d x 4 cycles     05/26/2022 -  Chemotherapy   Patient is on Treatment Plan : BREAST MAINTENANCE Trastuzumab IV (6) or SQ (600) D1 q21d X 11 Cycles       CHIEF COMPLIANT: Follow up herceptin and Perjeta   INTERVAL HISTORY: Allison Pierce is a 77 y.o with the above-mentioned metastatic breast cancer currently on herceptin perjeta. She presents to the clinic for a follow-up. She reports that she is tolerating the treatment well. 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 She complains of right side rib pain.    ALLERGIES:  is allergic to gadolinium derivatives, atorvastatin calcium, azelaic acid, oxybutynin, prozac [fluoxetine hcl], ritalin [methylphenidate], rosuvastatin, and lyrica [pregabalin].  MEDICATIONS:  Current Outpatient Medications  Medication Sig Dispense Refill   amoxicillin-clavulanate (AUGMENTIN) 875-125 MG tablet Take 1 tablet by mouth 2 (two) times daily. 8 tablet 0   B Complex-C (B COMPLEX-VITAMIN C) CAPS Take 1 capsule by mouth daily.     benzonatate (TESSALON) 100 MG capsule Take 1 capsule (100 mg total) by mouth every 8 (eight) hours. 21 capsule 0   buPROPion (WELLBUTRIN XL) 300 MG 24 hr tablet Take 300  mg by mouth every morning.     calcium carbonate (TUMS - DOSED IN MG ELEMENTAL CALCIUM) 500 MG chewable tablet Chew 2 tablets by mouth daily as needed for indigestion or heartburn.     cholecalciferol (VITAMIN D3) 25 MCG (1000  UNIT) tablet Take 1,000 Units by mouth daily.     clonazePAM (KLONOPIN) 1 MG tablet Take 1 mg by mouth at bedtime.     Cyanocobalamin (VITAMIN B-12) 5000 MCG TBDP Take 5,000 Units by mouth daily.     diphenhydrAMINE (BENADRYL) 25 MG tablet Take 25 mg by mouth in the morning.     fluticasone (FLONASE) 50 MCG/ACT nasal spray Place 1 spray into both nostrils daily as needed for allergies.     losartan (COZAAR) 25 MG tablet Take 50 mg by mouth daily.     meloxicam (MOBIC) 15 MG tablet Take 15 mg by mouth daily.     Potassium 99 MG TABS Take 99 mg by mouth at bedtime.     traZODone (DESYREL) 150 MG tablet Take 225 mg by mouth at bedtime.     venlafaxine XR (EFFEXOR-XR) 75 MG 24 hr capsule Take 75 mg by mouth daily with breakfast.     No current facility-administered medications for this visit.    PHYSICAL EXAMINATION: ECOG PERFORMANCE STATUS: 1 - Symptomatic but completely ambulatory  Vitals:   11/01/22 1051  BP: (!) 140/77  Pulse: 81  Resp: 15  Temp: 97.7 F (36.5 C)  SpO2: 99%   Filed Weights   11/01/22 1051  Weight: 145 lb 4.8 oz (65.9 kg)      LABORATORY DATA:  I have reviewed the data as listed    Latest Ref Rng & Units 10/11/2022    2:12 PM 10/04/2022    8:48 AM 09/13/2022    9:33 AM  CMP  Glucose 70 - 99 mg/dL 108  101  113   BUN 8 - 23 mg/dL '9  6  13   '$ Creatinine 0.44 - 1.00 mg/dL 0.91  0.81  0.90   Sodium 135 - 145 mmol/L 138  132  137   Potassium 3.5 - 5.1 mmol/L 3.7  3.6  3.6   Chloride 98 - 111 mmol/L 106  99  105   CO2 22 - 32 mmol/L '26  28  28   '$ Calcium 8.9 - 10.3 mg/dL 8.5  8.7  8.8   Total Protein 6.5 - 8.1 g/dL 6.0  5.7  5.3   Total Bilirubin 0.3 - 1.2 mg/dL 0.5  0.5  0.3   Alkaline Phos 38 - 126 U/L 96  102  84   AST 15 - 41 U/L '25  27  17   '$ ALT 0 - 44 U/L 35  28  15     Lab Results  Component Value Date   WBC 8.4 10/11/2022   HGB 11.2 (L) 10/11/2022   HCT 33.2 (L) 10/11/2022   MCV 79.8 (L) 10/11/2022   PLT 288 10/11/2022   NEUTROABS 6.3  10/11/2022    ASSESSMENT & PLAN:  Malignant neoplasm of upper-outer quadrant of left breast in female, estrogen receptor negative ([place]) 04/07/2022:Left mastectomy: 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  T4 vertebral body Bone scan 04/25/2022: Abnormal uptake in the sternum, right parietal region of calvarium, right  posterior seventh rib, T5, left sternoclavicular joint   Treatment: Palliative chemotherapy with Taxotere [name] and Perjeta every 3 weeks x1 cycle given 05/05/2022 (discontinued for severe toxicities and hospitalization)   ---------------------------------------------------------------------------------------------------------------------------------------------- Chemotoxicities: Hospitalization 05/11/2022-05/18/2022: Convulsive syncope, chemo induced neutropenia, oral mucositis, chronic CHF, severe diarrhea with dehydration ECHO 06/26/22: EF 55-60% (follows with [facility])   Treatment plan:05/26/22 [name]. added Perjeta 06/21/22.   07/30/2022: Bone scan: Interval resolution/decreasing accumulation of multiple bone metastases 10/30/2021: CT CAP: Mildly prominent mediastinal lymph nodes are similar, no pulmonary metastasis, skeletal lesions increasing sclerosis, no visceral metastases  Radiology review: Patient is continuing to respond well to treatment. Continue the same treatment plan. Return every 3 weeks for [name] and Perjeta.    No orders of the defined types were placed in this encounter.  The patient has a good understanding of the overall plan. she agrees with it. she will call with any problems that may develop before the next visit here. Total time spent: 30 mins including face to face time and time spent for planning,  charting and co-ordination of care   Harriette Ohara, MD 11/01/22    I Gardiner Coins am acting as a Education administrator for [employer]  I have reviewed the above documentation for accuracy and completeness, and I agree with the above.

## 2022-11-01 NOTE — Patient Instructions (Signed)
[facility]  Discharge Instructions: Thank you for choosing [facility] to provide your oncology and hematology care.   If you have a lab appointment with the [facility], please go directly to the [facility] and check in at the registration area.   Wear comfortable clothing and clothing appropriate for easy access to any Portacath or PICC line.   We strive to give you quality time with your provider. You may need to reschedule your appointment if you arrive late (15 or more minutes).  Arriving late affects you and other patients whose appointments are after yours.  Also, if you miss three or more appointments without notifying the office, you may be dismissed from the clinic at the provider's discretion.      For prescription refill requests, have your pharmacy contact our office and allow 72 hours for refills to be completed.    Today you received the following chemotherapy and/or immunotherapy agents: pertuzumab & trastuzumab       To help prevent nausea and vomiting after your treatment, we encourage you to take your nausea medication as directed.  BELOW ARE SYMPTOMS THAT SHOULD BE REPORTED IMMEDIATELY: *FEVER GREATER THAN 100.4 F (38 C) OR HIGHER *CHILLS OR SWEATING *NAUSEA AND VOMITING THAT IS NOT CONTROLLED WITH YOUR NAUSEA MEDICATION *UNUSUAL SHORTNESS OF BREATH *UNUSUAL BRUISING OR BLEEDING *URINARY PROBLEMS (pain or burning when urinating, or frequent urination) *BOWEL PROBLEMS (unusual diarrhea, constipation, pain near the anus) TENDERNESS IN MOUTH AND THROAT WITH OR WITHOUT PRESENCE OF ULCERS (sore throat, sores in mouth, or a toothache) UNUSUAL RASH, SWELLING OR PAIN  UNUSUAL VAGINAL DISCHARGE OR ITCHING   Items with * indicate a potential emergency and should be followed up as soon as possible or go to the Emergency Department if any problems should occur.  Please show the CHEMOTHERAPY ALERT CARD or IMMUNOTHERAPY  ALERT CARD at check-in to the Emergency Department and triage nurse.  Should you have questions after your visit or need to cancel or reschedule your appointment, please contact [facility]  Dept: [phone]  and follow the prompts.  Office hours are 8:00 a.m. to 4:30 p.m. Monday - Friday. Please note that voicemails left after 4:00 p.m. may not be returned until the following business day.  We are closed weekends and major holidays. You have access to a nurse at all times for urgent questions. Please call the main number to the clinic Dept: [phone] and follow the prompts.   For any non-urgent questions, you may also contact your provider using MyChart. We now offer e-Visits for anyone 7 and older to request care online for non-urgent symptoms. For details visit [email or web address].   Also download the MyChart app! Go to the app store, search "MyChart", open the app, select [facility], and log in with your MyChart username and password.

## 2022-11-03 ENCOUNTER — Other Ambulatory Visit: Payer: Self-pay

## 2022-11-10 ENCOUNTER — Encounter: Payer: Self-pay | Admitting: Hematology and Oncology

## 2022-11-10 NOTE — Progress Notes (Signed)
Received message from [facility] w/[facility] patient stopped by with billing questions.  Called patient to introduce myself as Arboriculturist and to answer her question. Patient needed itemized statements for a cancer policy. Provided patient number to Patient Accounting(billing) [phone]. She was very Patent attorney.

## 2022-11-20 NOTE — Progress Notes (Signed)
Patient Care Team: [name], MD as PCP - General (Family Medicine) Cameron Sprang, MD as Consulting Physician (Neurology) Mauro Kaufmann, RN as Oncology Nurse Navigator Rockwell Germany, RN as Oncology Nurse Navigator Coralie Keens, MD as Consulting Physician (General Surgery) Nicholas Lose, MD as Consulting Physician (Hematology and Oncology) Eppie Gibson, MD as Attending Physician (Radiation Oncology)  DIAGNOSIS: No diagnosis found.  SUMMARY OF ONCOLOGIC HISTORY: Oncology History  Malignant neoplasm of upper-outer quadrant of left breast in female, estrogen receptor negative ([place])  02/10/2022 Initial Diagnosis   Palpable left breast masses and calcifications: 1.8 cm at 1:00 and 1.7 cm at 9:00, calcifications not biopsy.  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 Report date is 03/05/2022.  The CustomNext-Cancer+RNAinsight panel offered by [facility] includes sequencing and rearrangement analysis for the following 48 genes:  APC, ATM, AXIN2, BARD1, BMPR1A, BRCA1, BRCA2, BRIP1, CDH1, CDK4, CDKN2A, CHEK2, CTNNA1, DICER1, EGFR, EPCAM, GREM1, HOXB13, KIT, MEN1, MLH1, MSH2, MSH3, MSH6, MUTYH, NBN, NF1, NTHL1, PALB2, PDGFRA, PMS2, POLD1, POLE, PTEN, RAD50, RAD51C, RAD51D, SDHA, SDHB, SDHC, SDHD, SMAD4, SMARCA4, STK11, TP53, TSC1, TSC2, and VHL.  RNA data is routinely analyzed for use in variant interpretation for all genes.   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, LCIS, 5/5 lymph nodes positive with extranodal extension, margins  negative ER 0%, PR 0%, HER2 3+, Ki-67 20%   04/27/2022 Cancer Staging   Staging form: Breast, AJCC 8th Edition - Pathologic: Stage IIIA (pT2, pN2, cM0, G2, ER-, PR-, HER2+) - Signed by Nicholas Lose, MD on 04/27/2022 Histologic grading system: 3 grade system   05/05/2022 - 05/26/2022 Chemotherapy   Patient is on Treatment Plan : BREAST DOCEtaxel + Trastuzumab + Pertuzumab (THP) q21d x 8 cycles / Trastuzumab + Pertuzumab q21d x 4 cycles     05/26/2022 -  Chemotherapy   Patient is on Treatment Plan : BREAST MAINTENANCE Trastuzumab IV (6) or SQ (600) D1 q21d X 11 Cycles       CHIEF COMPLIANT:   INTERVAL HISTORY: Allison Pierce is a   ALLERGIES:  is allergic to gadolinium derivatives, atorvastatin calcium, azelaic acid, oxybutynin, prozac [fluoxetine hcl], ritalin [methylphenidate], rosuvastatin, and lyrica [pregabalin].  MEDICATIONS:  Current Outpatient Medications  Medication Sig Dispense Refill   amoxicillin-clavulanate (AUGMENTIN) 875-125 MG tablet Take 1 tablet by mouth 2 (two) times daily. 8 tablet 0   B Complex-C (B COMPLEX-VITAMIN C) CAPS Take 1 capsule by mouth daily.     benzonatate (TESSALON) 100 MG capsule Take 1 capsule (100 mg total) by mouth every 8 (eight) hours. 21 capsule 0   buPROPion (WELLBUTRIN XL) 300 MG 24 hr tablet Take 300 mg by mouth every morning.     calcium carbonate (TUMS - DOSED IN MG ELEMENTAL CALCIUM) 500 MG chewable tablet Chew 2 tablets by mouth daily as needed for indigestion or heartburn.     cholecalciferol (VITAMIN D3) 25 MCG (1000 UNIT) tablet Take 1,000 Units by mouth daily.     clonazePAM (KLONOPIN) 1 MG tablet Take 1 mg by mouth at bedtime.  Cyanocobalamin (VITAMIN B-12) 5000 MCG TBDP Take 5,000 Units by mouth daily.     diphenhydrAMINE (BENADRYL) 25 MG tablet Take 25 mg by mouth in the morning.     fluticasone (FLONASE) 50 MCG/ACT nasal spray Place 1 spray into both nostrils daily as needed for allergies.     losartan (COZAAR) 25 MG  tablet Take 50 mg by mouth daily.     meloxicam (MOBIC) 15 MG tablet Take 15 mg by mouth daily.     Potassium 99 MG TABS Take 99 mg by mouth at bedtime.     traZODone (DESYREL) 150 MG tablet Take 225 mg by mouth at bedtime.     venlafaxine XR (EFFEXOR-XR) 75 MG 24 hr capsule Take 75 mg by mouth daily with breakfast.     No current facility-administered medications for this visit.    PHYSICAL EXAMINATION: ECOG PERFORMANCE STATUS: {CHL ONC ECOG PS:[id number]}  There were no vitals filed for this visit. There were no vitals filed for this visit.  BREAST:*** No palpable masses or nodules in either right or left breasts. No palpable axillary supraclavicular or infraclavicular adenopathy no breast tenderness or nipple discharge. (exam performed in the presence of a chaperone)  LABORATORY DATA:  I have reviewed the data as listed    Latest Ref Rng & Units 10/11/2022    2:12 PM 10/04/2022    8:48 AM 09/13/2022    9:33 AM  CMP  Glucose 70 - 99 mg/dL 108  101  113   BUN 8 - 23 mg/dL 9  6  13   $ Creatinine 0.44 - 1.00 mg/dL 0.91  0.81  0.90   Sodium 135 - 145 mmol/L 138  132  137   Potassium 3.5 - 5.1 mmol/L 3.7  3.6  3.6   Chloride 98 - 111 mmol/L 106  99  105   CO2 22 - 32 mmol/L 26  28  28   $ Calcium 8.9 - 10.3 mg/dL 8.5  8.7  8.8   Total Protein 6.5 - 8.1 g/dL 6.0  5.7  5.3   Total Bilirubin 0.3 - 1.2 mg/dL 0.5  0.5  0.3   Alkaline Phos 38 - 126 U/L 96  102  84   AST 15 - 41 U/L 25  27  17   $ ALT 0 - 44 U/L 35  28  15     Lab Results  Component Value Date   WBC 8.4 10/11/2022   HGB 11.2 (L) 10/11/2022   HCT 33.2 (L) 10/11/2022   MCV 79.8 (L) 10/11/2022   PLT 288 10/11/2022   NEUTROABS 6.3 10/11/2022    ASSESSMENT & PLAN:  No problem-specific Assessment & Plan notes found for this encounter.    No orders of the defined types were placed in this encounter.  The patient has a good understanding of the overall plan. she agrees with it. she will call with any problems that may  develop before the next visit here. Total time spent: 30 mins including face to face time and time spent for planning, charting and co-ordination of care   Suzzette Righter, [place] [facility] 11/20/22    I Gardiner Coins am acting as a Education administrator for [employer]  ***

## 2022-11-22 ENCOUNTER — Inpatient Hospital Stay (HOSPITAL_BASED_OUTPATIENT_CLINIC_OR_DEPARTMENT_OTHER): Payer: Medicare Other | Admitting: Hematology and Oncology

## 2022-11-22 ENCOUNTER — Inpatient Hospital Stay: Payer: Medicare Other | Attending: Hematology and Oncology

## 2022-11-22 ENCOUNTER — Other Ambulatory Visit: Payer: Self-pay

## 2022-11-22 VITALS — BP 167/77 | HR 67 | Temp 97.8°F | Resp 18

## 2022-11-22 VITALS — BP 164/74 | HR 73 | Temp 97.5°F | Resp 18 | Ht 66.0 in | Wt 145.5 lb

## 2022-11-22 DIAGNOSIS — Z171 Estrogen receptor negative status [ER-]: Secondary | ICD-10-CM

## 2022-11-22 DIAGNOSIS — Z9012 Acquired absence of left breast and nipple: Secondary | ICD-10-CM | POA: Diagnosis not present

## 2022-11-22 DIAGNOSIS — C7951 Secondary malignant neoplasm of bone: Secondary | ICD-10-CM | POA: Insufficient documentation

## 2022-11-22 DIAGNOSIS — C50412 Malignant neoplasm of upper-outer quadrant of left female breast: Secondary | ICD-10-CM | POA: Diagnosis not present

## 2022-11-22 DIAGNOSIS — Z923 Personal history of irradiation: Secondary | ICD-10-CM | POA: Insufficient documentation

## 2022-11-22 DIAGNOSIS — Z5112 Encounter for antineoplastic immunotherapy: Secondary | ICD-10-CM | POA: Insufficient documentation

## 2022-11-22 MED ORDER — TRASTUZUMAB-DKST CHEMO 150 MG IV SOLR
6.0000 mg/kg | Freq: Once | INTRAVENOUS | Status: AC
  Administered 2022-11-22: 420 mg via INTRAVENOUS
  Filled 2022-11-22: qty 20

## 2022-11-22 MED ORDER — DIPHENHYDRAMINE HCL 25 MG PO CAPS
25.0000 mg | ORAL_CAPSULE | Freq: Once | ORAL | Status: AC
  Administered 2022-11-22: 25 mg via ORAL
  Filled 2022-11-22: qty 1

## 2022-11-22 MED ORDER — SODIUM CHLORIDE 0.9 % IV SOLN: Freq: Once | INTRAVENOUS | Status: AC

## 2022-11-22 MED ORDER — ACETAMINOPHEN 325 MG PO TABS
650.0000 mg | ORAL_TABLET | Freq: Once | ORAL | Status: AC
  Administered 2022-11-22: 650 mg via ORAL
  Filled 2022-11-22: qty 2

## 2022-11-22 MED ORDER — HEPARIN SOD (PORK) LOCK FLUSH 100 UNIT/ML IV SOLN: 500.0000 [IU] | Freq: Once | INTRAVENOUS | Status: DC | PRN

## 2022-11-22 MED ORDER — SODIUM CHLORIDE 0.9% FLUSH: 10.0000 mL | INTRAVENOUS | Status: DC | PRN

## 2022-11-22 MED ORDER — SODIUM CHLORIDE 0.9 % IV SOLN
420.0000 mg | Freq: Once | INTRAVENOUS | Status: AC
  Administered 2022-11-22: 420 mg via INTRAVENOUS
  Filled 2022-11-22: qty 14

## 2022-11-22 NOTE — Assessment & Plan Note (Addendum)
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  T4 vertebral body Bone scan 04/25/2022: Abnormal uptake in the sternum, right parietal region of calvarium, right posterior seventh rib, T5, left sternoclavicular joint   Treatment: Palliative chemotherapy with Taxotere [name] and Perjeta every 3 weeks x1 cycle given 05/05/2022 (discontinued for severe toxicities and hospitalization)   ---------------------------------------------------------------------------------------------------------------------------------------------- Chemotoxicities: Hospitalization 05/11/2022-05/18/2022: Convulsive syncope, chemo induced neutropenia, oral mucositis, chronic CHF, severe diarrhea with dehydration ECHO 06/26/22: EF 55-60% (follows with [facility])   Treatment plan:05/26/22 [name]. added Perjeta 06/21/22.   07/30/2022: Bone scan: Interval resolution/decreasing accumulation of multiple bone metastases 10/30/2022: CT CAP: Mildly prominent mediastinal lymph nodes are similar, no pulmonary metastasis, skeletal lesions increasing sclerosis, no visceral metastases   Radiology review: Patient is continuing to respond well to treatment. Continue the same treatment plan. Return every 3 weeks for [name] and Perjeta.

## 2022-11-27 ENCOUNTER — Other Ambulatory Visit: Payer: Self-pay

## 2022-11-27 ENCOUNTER — Telehealth: Payer: Self-pay

## 2022-11-27 ENCOUNTER — Inpatient Hospital Stay: Payer: Medicare Other

## 2022-11-27 ENCOUNTER — Other Ambulatory Visit (HOSPITAL_COMMUNITY): Payer: Self-pay

## 2022-11-27 ENCOUNTER — Inpatient Hospital Stay (HOSPITAL_BASED_OUTPATIENT_CLINIC_OR_DEPARTMENT_OTHER): Payer: Medicare Other | Admitting: Physician Assistant

## 2022-11-27 ENCOUNTER — Encounter: Payer: Self-pay | Admitting: Hematology and Oncology

## 2022-11-27 ENCOUNTER — Ambulatory Visit (HOSPITAL_COMMUNITY)
Admission: RE | Admit: 2022-11-27 | Discharge: 2022-11-27 | Disposition: A | Discharge status: Home / Self Care | Payer: Medicare Other | Source: Ambulatory Visit | Attending: Physician Assistant | Admitting: Physician Assistant

## 2022-11-27 VITALS — BP 171/82 | HR 67 | Temp 97.9°F | Resp 16 | Wt 148.4 lb

## 2022-11-27 DIAGNOSIS — Z95828 Presence of other vascular implants and grafts: Secondary | ICD-10-CM | POA: Diagnosis not present

## 2022-11-27 DIAGNOSIS — Z171 Estrogen receptor negative status [ER-]: Secondary | ICD-10-CM | POA: Diagnosis not present

## 2022-11-27 DIAGNOSIS — J0100 Acute maxillary sinusitis, unspecified: Secondary | ICD-10-CM

## 2022-11-27 DIAGNOSIS — C7951 Secondary malignant neoplasm of bone: Secondary | ICD-10-CM | POA: Diagnosis not present

## 2022-11-27 DIAGNOSIS — C50412 Malignant neoplasm of upper-outer quadrant of left female breast: Secondary | ICD-10-CM

## 2022-11-27 DIAGNOSIS — R051 Acute cough: Secondary | ICD-10-CM | POA: Diagnosis not present

## 2022-11-27 DIAGNOSIS — R059 Cough, unspecified: Secondary | ICD-10-CM | POA: Diagnosis not present

## 2022-11-27 DIAGNOSIS — Z9012 Acquired absence of left breast and nipple: Secondary | ICD-10-CM | POA: Diagnosis not present

## 2022-11-27 DIAGNOSIS — Z923 Personal history of irradiation: Secondary | ICD-10-CM | POA: Diagnosis not present

## 2022-11-27 DIAGNOSIS — Z5112 Encounter for antineoplastic immunotherapy: Secondary | ICD-10-CM | POA: Diagnosis not present

## 2022-11-27 LAB — CBC WITH DIFFERENTIAL (CANCER CENTER ONLY)
Abs Immature Granulocytes: 0.02 10*3/uL (ref 0.00–0.07)
Basophils Absolute: 0.1 10*3/uL (ref 0.0–0.1)
Basophils Relative: 1 %
Eosinophils Absolute: 0.2 10*3/uL (ref 0.0–0.5)
Eosinophils Relative: 3 %
HCT: 33.2 % — ABNORMAL LOW (ref 36.0–46.0)
Hemoglobin: 11.3 g/dL — ABNORMAL LOW (ref 12.0–15.0)
Immature Granulocytes: 0 %
Lymphocytes Relative: 14 %
Lymphs Abs: 1 10*3/uL (ref 0.7–4.0)
MCH: 27.6 pg (ref 26.0–34.0)
MCHC: 34 g/dL (ref 30.0–36.0)
MCV: 81.2 fL (ref 80.0–100.0)
Monocytes Absolute: 0.5 10*3/uL (ref 0.1–1.0)
Monocytes Relative: 8 %
Neutro Abs: 5.2 10*3/uL (ref 1.7–7.7)
Neutrophils Relative %: 74 %
Platelet Count: 295 10*3/uL (ref 150–400)
RBC: 4.09 MIL/uL (ref 3.87–5.11)
RDW: 14.2 % (ref 11.5–15.5)
WBC Count: 7 10*3/uL (ref 4.0–10.5)
nRBC: 0 % (ref 0.0–0.2)

## 2022-11-27 LAB — CMP (CANCER CENTER ONLY)
ALT: 17 U/L (ref 0–44)
AST: 17 U/L (ref 15–41)
Albumin: 3.9 g/dL (ref 3.5–5.0)
Alkaline Phosphatase: 92 U/L (ref 38–126)
Anion gap: 5 (ref 5–15)
BUN: 18 mg/dL (ref 8–23)
CO2: 28 mmol/L (ref 22–32)
Calcium: 8.5 mg/dL — ABNORMAL LOW (ref 8.9–10.3)
Chloride: 98 mmol/L (ref 98–111)
Creatinine: 0.91 mg/dL (ref 0.44–1.00)
GFR, Estimated: >60 mL/min (ref >60)
Glucose, Bld: 97 mg/dL (ref 70–99)
Potassium: 4.3 mmol/L (ref 3.5–5.1)
Sodium: 131 mmol/L — ABNORMAL LOW (ref 135–145)
Total Bilirubin: 0.4 mg/dL (ref 0.3–1.2)
Total Protein: 6.2 g/dL — ABNORMAL LOW (ref 6.5–8.1)

## 2022-11-27 MED ORDER — SODIUM CHLORIDE 0.9% FLUSH
10.0000 mL | Freq: Once | INTRAVENOUS | Status: AC
  Administered 2022-11-27: 10 mL

## 2022-11-27 MED ORDER — LIDOCAINE VISCOUS HCL 2 % MT SOLN
5.0000 mL | Freq: Three times a day (TID) | OROMUCOSAL | 1 refills | Status: DC | PRN
  Filled 2022-11-27: qty 360, 24d supply, fill #0
  Filled 2023-02-15: qty 360, 24d supply, fill #1

## 2022-11-27 MED ORDER — HEPARIN SOD (PORK) LOCK FLUSH 100 UNIT/ML IV SOLN
500.0000 [IU] | Freq: Once | INTRAVENOUS | Status: AC
  Administered 2022-11-27: 500 [IU]

## 2022-11-27 MED ORDER — PROMETHAZINE-DM 6.25-15 MG/5ML PO SYRP
2.5000 mL | ORAL_SOLUTION | Freq: Four times a day (QID) | ORAL | 0 refills | Status: DC | PRN
  Filled 2022-11-27: qty 118, 12d supply, fill #0

## 2022-11-27 MED ORDER — AMOXICILLIN-POT CLAVULANATE 875-125 MG PO TABS
1.0000 | ORAL_TABLET | Freq: Two times a day (BID) | ORAL | 0 refills | Status: DC
  Filled 2022-11-27: qty 7, 4d supply, fill #0

## 2022-11-27 NOTE — Progress Notes (Signed)
Symptom Management Consult note [place]    Patient Care Team: [name], MD as PCP - General (Family Medicine) Cameron Sprang, MD as Consulting Physician (Neurology) Mauro Kaufmann, RN as Oncology Nurse Navigator Rockwell Germany, RN as Oncology Nurse Navigator Coralie Keens, MD as Consulting Physician (General Surgery) Nicholas Lose, MD as Consulting Physician (Hematology and Oncology) Eppie Gibson, MD as Attending Physician (Radiation Oncology)    Name / MRN / DOB: Allison Pierce  [id number]  16-Sep-1946   Date of visit: 11/27/2022   Chief Complaint/Reason for visit: cough, congestion, sore throat   Current Therapy: Pertuzumab and trastuzumab-dkst  Last treatment:  Day 1   Cycle 9 on 11/22/22   ASSESSMENT & PLAN: Patient is a 77 y.o. female  with oncologic history of malignant neoplasm of upper-outer quadrant of left breast in female, estrogen receptor negative followed by Dr. Lindi Adie.  I have viewed most recent oncology note and lab work.    #Malignant neoplasm of upper-outer quadrant of left breast in female, estrogen receptor negative  - Next appointment with oncologist is 12/14/22   #Mucositis - Secondary to treatment. Grade 2 on exam. Prescription sent to pharmacy for magic mouth wash.   #URI - Patient non toxic appearing, afebrile. No hypoxia.  - Exam with maxillary sinus tenderness, clear lungs.  - Chest xray viewed by me without acute infectious processes. Does show possible bronchitis. Agree with radiologist impression. CBC overall unremarkable, stable anemia. - As patient's symptoms of sinusitis have been ongoing x 10 days will treat with antibiotic. Augmentin sent to pharmacy. - Patient with cough, keeping her awake at night, therefore will prescribe promethazine-dm for symptom relief as it has worked for her in the past.  Strict ED precautions discussed should symptoms worsen.    Heme/Onc History: Oncology History   Malignant neoplasm of upper-outer quadrant of left breast in female, estrogen receptor negative ([place])  02/10/2022 Initial Diagnosis   Palpable left breast masses and calcifications: 1.8 cm at 1:00 and 1.7 cm at 9:00, calcifications not biopsy.  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 Report date is 03/05/2022.  The CustomNext-Cancer+RNAinsight panel offered by [facility] includes sequencing and rearrangement analysis for the following 48 genes:  APC, ATM, AXIN2, BARD1, BMPR1A, BRCA1, BRCA2, BRIP1, CDH1, CDK4, CDKN2A, CHEK2, CTNNA1, DICER1, EGFR, EPCAM, GREM1, HOXB13, KIT, MEN1, MLH1, MSH2, MSH3, MSH6, MUTYH, NBN, NF1, NTHL1, PALB2, PDGFRA, PMS2, POLD1, POLE, PTEN, RAD50, RAD51C, RAD51D, SDHA, SDHB, SDHC, SDHD, SMAD4, SMARCA4, STK11, TP53, TSC1, TSC2, and VHL.  RNA data is routinely analyzed for use in variant interpretation for all genes.   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, LCIS, 5/5 lymph nodes positive with extranodal extension, margins negative ER 0%, PR 0%, HER2 3+, Ki-67 20%   04/27/2022 Cancer Staging   Staging form: Breast, AJCC 8th Edition - Pathologic: Stage IIIA (pT2, pN2, cM0, G2, ER-, PR-, HER2+) - Signed by Nicholas Lose, MD on 04/27/2022 Histologic grading system: 3 grade system   05/05/2022 - 05/26/2022 Chemotherapy   Patient is  on Treatment Plan : BREAST DOCEtaxel + Trastuzumab + Pertuzumab (THP) q21d x 8 cycles / Trastuzumab + Pertuzumab q21d x 4 cycles     05/26/2022 -  Chemotherapy   Patient is on Treatment Plan : BREAST  MAINTENANCE Trastuzumab IV (6) or SQ (600) D1 q21d X 11 Cycles         Interval history-: Allison Pierce is a 77 y.o. female with oncologic history as above presenting [facility] today with chief complaint of cough, congestion and sore throat x 2 weeks.  She is accompanied today by her spouse who provides additional history.  Patient states she had similar symptoms back in early January that resolved after a course of antibiotics. Patient had been feeling healthy until these symptoms recently started. She report symptoms have been progressively worsening. Patient states her cough is productive and keeps her awake at night.  She is experiencing nasal congestion and her throat feels as if she is swallowing knives. She has tried multiple OTC medications without symptom improvement. She is staying well hydrated, drinks at least 50 ounces of fluid daily. Swallowing makes her throat pain worsen yet she is tolerating PO intake. She denies any sick contacts. She has not had fever, chills, shortness of breath or wheezing.      ROS  All other systems are reviewed and are negative for acute change except as noted in the HPI.    Allergies  Allergen Reactions   Gadolinium Derivatives Itching and Other (See Comments)    Redness and itching, skins feels like it was burning. In the future pt will need pre med.   Atorvastatin Calcium Other (See Comments)    Unknown per Pt     Azelaic Acid Other (See Comments)    Unknown per Pt    Oxybutynin Other (See Comments)    Unknown per Pt    Prozac [Fluoxetine Hcl] Other (See Comments)    "Could not lift head up or get out of bed"    Ritalin [Methylphenidate] Other (See Comments)    Unknown per Pt    Rosuvastatin Other (See Comments)    Unknown per Pt    Lyrica [Pregabalin] Anxiety and Other (See Comments)    "Felt weird"     Past Medical History:  Diagnosis Date   Anxiety    Cancer ([place])    Depression    GERD (gastroesophageal reflux  disease) OCCASIONALLY  TAKE TUMS   Human papilloma virus    Hyperlipidemia    Hypertension    UTI (urinary tract infection)    Vulvar lesion      Past Surgical History:  Procedure Laterality Date   ABDOMINAL HYSTERECTOMY  1992   W/ SALPINGO-OOPHORECTOMY   ANTERIOR CERVICAL DECOMP/DISCECTOMY FUSION  01-18-2009  DR POOLE   C4  - C6   AUGMENTATION MAMMAPLASTY Bilateral    BREAST BIOPSY Right    BREAST BIOPSY Left 02/10/2022   x2   BREAST ENHANCEMENT SURGERY  2011   BREAST EXCISIONAL BIOPSY Left    BREAST RECONSTRUCTION WITH PLACEMENT OF TISSUE EXPANDER AND FLEX HD (ACELLULAR HYDRATED DERMIS) Left 04/07/2022   Procedure: BREAST RECONSTRUCTION WITH PLACEMENT OF TISSUE EXPANDER AND FLEX HD (ACELLULAR HYDRATED DERMIS);  Surgeon: [name], MD;  Location: [place];  Service: Plastics;  Laterality: Left;   BREAST SURGERY  02-04-2011  dr Margot Chimes   EXCISION LEFT BREAST MASS--  CALCIFICATION   EYE SURGERY Bilateral    cataract removal   MASTECTOMY W/ SENTINEL NODE BIOPSY Left 04/07/2022  Procedure: LEFT MASTECTOMY WITH SENTINEL NODE BIOPSY;  Surgeon: Coralie Keens, MD;  Location: [place];  Service: General;  Laterality: Left;  LMA   PORTACATH PLACEMENT N/A 04/07/2022   Procedure: PORT-A-CATH INSERTION WITH ULTRASOUND GUIDANCE;  Surgeon: Coralie Keens, MD;  Location: [place];  Service: General;  Laterality: N/A;   VULVAR LESION REMOVAL  09/10/2012   Procedure: VULVAR LESION;  Surgeon: Selinda Orion, MD;  Location[facility];  Service: Gynecology;  Laterality: N/A;  WIDE EXCISION OF VULVAR LESION    Social History   Socioeconomic History   Marital status: Married    Spouse name: [name]   Number of children: 1   Years of education: 12   Highest education level: Not on file  Occupational History   Not on file  Tobacco Use   Smoking status: Never   Smokeless tobacco: Never  Vaping Use   Vaping Use: Never used  Substance and Sexual Activity   Alcohol use: Yes     Alcohol/week: 7.0 standard drinks of alcohol    Types: 7 Cans of beer per week   Drug use: Never   Sexual activity: Not Currently  Other Topics Concern   Not on file  Social History Narrative   Graduated HS      Right handed      Lives with husband   Social Determinants of Health   Financial Resource Strain: Low Risk  (02/22/2022)   Overall Financial Resource Strain (CARDIA)    Difficulty of Paying Living Expenses: Not hard at all  Food Insecurity: No Food Insecurity (02/22/2022)   Hunger Vital Sign    Worried About Running Out of Food in the Last Year: Never true    Ran Out of Food in the Last Year: Never true  Transportation Needs: No Transportation Needs (02/22/2022)   PRAPARE - Hydrologist (Medical): No    Lack of Transportation (Non-Medical): No  Physical Activity: Not on file  Stress: Not on file  Social Connections: Not on file  Intimate Partner Violence: Not on file    Family History  Problem Relation Age of Onset   Lung cancer Mother 53       she smoked   Prostate cancer Brother 33   Breast cancer Cousin        paternal first cousin   Breast cancer Cousin        paternal first cousin     Current Outpatient Medications:    amoxicillin-clavulanate (AUGMENTIN) 875-125 MG tablet, Take 1 tablet by mouth 2 (two) times daily., Disp: 7 tablet, Rfl: 0   magic mouthwash (lidocaine, diphenhydrAMINE, alum & mag hydroxide) suspension, Swish and swallow 5 mLs by mouth 3 times daily as needed for mouth pain., Disp: 360 mL, Rfl: 1   promethazine-dextromethorphan (PROMETHAZINE-DM) 6.25-15 MG/5ML syrup, Take 2.5 mLs by mouth 4 (four) times daily as needed for cough., Disp: 118 mL, Rfl: 0   B Complex-C (B COMPLEX-VITAMIN C) CAPS, Take 1 capsule by mouth daily., Disp: , Rfl:    buPROPion (WELLBUTRIN XL) 300 MG 24 hr tablet, Take 300 mg by mouth every morning., Disp: , Rfl:    calcium carbonate (TUMS - DOSED IN MG ELEMENTAL CALCIUM) 500 MG chewable  tablet, Chew 2 tablets by mouth daily as needed for indigestion or heartburn., Disp: , Rfl:    cholecalciferol (VITAMIN D3) 25 MCG (1000 UNIT) tablet, Take 1,000 Units by mouth daily., Disp: , Rfl:    clonazePAM (KLONOPIN) 1  MG tablet, Take 1 mg by mouth at bedtime., Disp: , Rfl:    Cyanocobalamin (VITAMIN B-12) 5000 MCG TBDP, Take 5,000 Units by mouth daily., Disp: , Rfl:    diphenhydrAMINE (BENADRYL) 25 MG tablet, Take 25 mg by mouth in the morning., Disp: , Rfl:    fluticasone (FLONASE) 50 MCG/ACT nasal spray, Place 1 spray into both nostrils daily as needed for allergies., Disp: , Rfl:    losartan (COZAAR) 25 MG tablet, Take 50 mg by mouth daily., Disp: , Rfl:    meloxicam (MOBIC) 15 MG tablet, Take 15 mg by mouth daily., Disp: , Rfl:    Potassium 99 MG TABS, Take 99 mg by mouth at bedtime., Disp: , Rfl:    traZODone (DESYREL) 150 MG tablet, Take 225 mg by mouth at bedtime., Disp: , Rfl:    venlafaxine XR (EFFEXOR-XR) 75 MG 24 hr capsule, Take 75 mg by mouth daily with breakfast., Disp: , Rfl:   PHYSICAL EXAM: ECOG FS:1 - Symptomatic but completely ambulatory    Vitals:   11/27/22 1349  BP: (!) 171/82  Pulse: 67  Resp: 16  Temp: 97.9 F (36.6 C)  TempSrc: Oral  SpO2: 100%  Weight: 148 lb 6.4 oz (67.3 kg)   Physical Exam Vitals and nursing note reviewed.  Constitutional:      Appearance: She is well-developed. She is not ill-appearing or toxic-appearing.  HENT:     Head: Normocephalic.     Nose:     Right Sinus: Maxillary sinus tenderness present.     Left Sinus: Maxillary sinus tenderness present.     Mouth/Throat:     Comments: Erythema and ulcers on oral mucosa Eyes:     Conjunctiva/sclera: Conjunctivae normal.  Neck:     Vascular: No JVD.  Cardiovascular:     Rate and Rhythm: Normal rate and regular rhythm.     Pulses: Normal pulses.     Heart sounds: Normal heart sounds.  Pulmonary:     Effort: Pulmonary effort is normal. No respiratory distress.     Breath  sounds: Normal breath sounds. No stridor. No wheezing, rhonchi or rales.     Comments: Coughing during exam Chest:     Chest wall: No tenderness.  Abdominal:     General: There is no distension.  Musculoskeletal:     Cervical back: Normal range of motion.  Skin:    General: Skin is warm and dry.     Findings: No rash.  Neurological:     Mental Status: She is oriented to person, place, and time.        LABORATORY DATA: I have reviewed the data as listed    Latest Ref Rng & Units 11/27/2022    1:19 PM 10/11/2022    2:12 PM 10/04/2022    8:48 AM  CBC  WBC 4.0 - 10.5 K/uL 7.0  8.4  6.5   Hemoglobin 12.0 - 15.0 g/dL 11.3  11.2  10.7   Hematocrit 36.0 - 46.0 % 33.2  33.2  32.0   Platelets 150 - 400 K/uL 295  288  212         Latest Ref Rng & Units 11/27/2022    1:19 PM 10/11/2022    2:12 PM 10/04/2022    8:48 AM  CMP  Glucose 70 - 99 mg/dL 97  108  101   BUN 8 - 23 mg/dL '18  9  6   '$ Creatinine 0.44 - 1.00 mg/dL 0.91  0.91  0.81  Sodium 135 - 145 mmol/L 131  138  132   Potassium 3.5 - 5.1 mmol/L 4.3  3.7  3.6   Chloride 98 - 111 mmol/L 98  106  99   CO2 22 - 32 mmol/L '28  26  28   '$ Calcium 8.9 - 10.3 mg/dL 8.5  8.5  8.7   Total Protein 6.5 - 8.1 g/dL 6.2  6.0  5.7   Total Bilirubin 0.3 - 1.2 mg/dL 0.4  0.5  0.5   Alkaline Phos 38 - 126 U/L 92  96  102   AST 15 - 41 U/L '17  25  27   '$ ALT 0 - 44 U/L 17  35  28        RADIOGRAPHIC STUDIES (from last 24 hours if applicable) I have personally reviewed the radiological images as listed and agreed with the findings in the report. DG Chest 2 View  Result Date: 11/27/2022 CLINICAL DATA:  Persistent cough EXAM: CHEST - 2 VIEW COMPARISON:  None Available. FINDINGS: The heart size and mediastinal contours are within normal limits. Prominence of the bronchial markings suggesting bronchitis no focal consolidation or pleural effusion. Right IJ access MediPort with distal tip in the SVC. Partially imaged cervical spine hardware. Mild  multilevel degenerate disc disease of the thoracic spine with mild kyphosis. IMPRESSION: Prominence of the bronchial markings suggesting bronchitis. No focal consolidation or pleural effusion. Electronically Signed   By: Keane Police D.O.   On: 11/27/2022 12:42        Visit Diagnosis: 1. Port-A-Cath in place   2. Malignant neoplasm of upper-outer quadrant of left breast in female, estrogen receptor negative ([place])   3. Acute cough   4. Subacute maxillary sinusitis      No orders of the defined types were placed in this encounter.   All questions were answered. The patient knows to call the clinic with any problems, questions or concerns. No barriers to learning was detected.  I have spent a total of 30 minutes minutes of face-to-face and non-face-to-face time, preparing to see the patient, obtaining and/or reviewing separately obtained history, performing a medically appropriate examination, counseling and educating the patient, ordering tests, documenting clinical information in the electronic health record, and care coordination (communications with other health care professionals or caregivers).    Thank you for allowing me to participate in the care of this patient.    Barrie Folk, PA-C Department of Hematology/Oncology [facility] at [facility] Phone: [phone]  [phone]    11/27/2022 3:43 PM

## 2022-11-27 NOTE — Telephone Encounter (Signed)
Left BRCA pt currently on herceptin/perjeta (last tx 11/22/22) reports upper resp sx X2 weeks. Sinus pressure in her nose/head, sore throat, productive cough. Denies fever/chills. Has not taken covid test. Pt was offered appt with [facility] today and she accepted. She understands she will need a CXR and labs prior to appt per PA. All orders placed and pt is scheduled. She knows to arrive at [facility] main entrance for check in for walk in CXR then come to [facility] for labs and PA visit.

## 2022-12-06 ENCOUNTER — Ambulatory Visit (INDEPENDENT_AMBULATORY_CARE_PROVIDER_SITE_OTHER): Payer: Medicare Other | Admitting: Plastic Surgery

## 2022-12-06 DIAGNOSIS — Z9889 Other specified postprocedural states: Secondary | ICD-10-CM

## 2022-12-06 DIAGNOSIS — C50412 Malignant neoplasm of upper-outer quadrant of left female breast: Secondary | ICD-10-CM

## 2022-12-06 NOTE — Progress Notes (Signed)
Allison Pierce returns today for follow-up.  The patient had a left tissue expander placed after mastectomy for breast cancer and would like to have the tissue expander removed and an implant placed.  The patient is still on maintenance chemotherapy due to widespread metastatic disease.  She is unlikely to come off of chemotherapy.  On examination she has a well-healed incision and has a tissue expander easily palpable in place on the left side.  We discussed her options at length.  She can leave the tissue expander in place and this will not cause any problems.  She can also have the tissue expander removed and a silicone implant placed.  Because of her chemotherapy which will affect wound healing and her overall immunologic status she is at a higher risk for wound complications and infection.  I am willing though to remove the tissue expander and place the implant if that is what she would like.  She would like to speak with her oncologist about this.  If she does decide to move forward with placement of the implant we may be able to use the midportion of her time between treatments to place the implant to lower the risk of infection and wound complications to as low as possible.  She understands that if we do proceed with replacement of the tissue expander and she has a complication that she is unlikely to be a candidate for any further reconstruction.  Call me next week and let me know how she would like to proceed.  She was given a handout that outlines strategies for improving wound healing through nutrition.

## 2022-12-08 NOTE — Progress Notes (Signed)
Patient Care Team: [name], MD as PCP - General (Family Medicine) Cameron Sprang, MD as Consulting Physician (Neurology) Mauro Kaufmann, RN as Oncology Nurse Navigator Rockwell Germany, RN as Oncology Nurse Navigator Coralie Keens, MD as Consulting Physician (General Surgery) Nicholas Lose, MD as Consulting Physician (Hematology and Oncology) Eppie Gibson, MD as Attending Physician (Radiation Oncology)  DIAGNOSIS: No diagnosis found.  SUMMARY OF ONCOLOGIC HISTORY: Oncology History  Malignant neoplasm of upper-outer quadrant of left breast in female, estrogen receptor negative ([place])  02/10/2022 Initial Diagnosis   Palpable left breast masses and calcifications: 1.8 cm at 1:00 and 1.7 cm at 9:00, calcifications not biopsy.  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 Report date is 03/05/2022.  The CustomNext-Cancer+RNAinsight panel offered by [facility] includes sequencing and rearrangement analysis for the following 48 genes:  APC, ATM, AXIN2, BARD1, BMPR1A, BRCA1, BRCA2, BRIP1, CDH1, CDK4, CDKN2A, CHEK2, CTNNA1, DICER1, EGFR, EPCAM, GREM1, HOXB13, KIT, MEN1, MLH1, MSH2, MSH3, MSH6, MUTYH, NBN, NF1, NTHL1, PALB2, PDGFRA, PMS2, POLD1, POLE, PTEN, RAD50, RAD51C, RAD51D, SDHA, SDHB, SDHC, SDHD, SMAD4, SMARCA4, STK11, TP53, TSC1, TSC2, and VHL.  RNA data is routinely analyzed for use in variant interpretation for all genes.   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, LCIS, 5/5 lymph nodes positive with extranodal extension, margins  negative ER 0%, PR 0%, HER2 3+, Ki-67 20%   04/27/2022 Cancer Staging   Staging form: Breast, AJCC 8th Edition - Pathologic: Stage IIIA (pT2, pN2, cM0, G2, ER-, PR-, HER2+) - Signed by Nicholas Lose, MD on 04/27/2022 Histologic grading system: 3 grade system   05/05/2022 - 05/26/2022 Chemotherapy   Patient is on Treatment Plan : BREAST DOCEtaxel + Trastuzumab + Pertuzumab (THP) q21d x 8 cycles / Trastuzumab + Pertuzumab q21d x 4 cycles     05/26/2022 -  Chemotherapy   Patient is on Treatment Plan : BREAST MAINTENANCE Trastuzumab IV (6) or SQ (600) D1 q21d X 11 Cycles       CHIEF COMPLIANT: Follow up herceptin and Perjeta     INTERVAL HISTORY: Allison Pierce is a  77 y.o with the above-mentioned metastatic breast cancer currently on herceptin perjeta. She presents to the clinic for a follow-up.    ALLERGIES:  is allergic to gadolinium derivatives, atorvastatin calcium, azelaic acid, oxybutynin, prozac [fluoxetine hcl], ritalin [methylphenidate], rosuvastatin, and lyrica [pregabalin].  MEDICATIONS:  Current Outpatient Medications  Medication Sig Dispense Refill   amoxicillin-clavulanate (AUGMENTIN) 875-125 MG tablet Take 1 tablet by mouth 2 (two) times daily. 7 tablet 0   B Complex-C (B COMPLEX-VITAMIN C) CAPS Take 1 capsule by mouth daily.     buPROPion (WELLBUTRIN XL) 300 MG 24 hr tablet Take 300 mg by mouth every morning.     calcium carbonate (TUMS - DOSED IN MG ELEMENTAL CALCIUM) 500 MG chewable tablet Chew 2 tablets by mouth daily as needed for indigestion or heartburn.     cholecalciferol (VITAMIN D3) 25 MCG (1000 UNIT) tablet Take 1,000 Units by mouth daily.     clonazePAM (KLONOPIN) 1 MG tablet Take  1 mg by mouth at bedtime.     Cyanocobalamin (VITAMIN B-12) 5000 MCG TBDP Take 5,000 Units by mouth daily.     diphenhydrAMINE (BENADRYL) 25 MG tablet Take 25 mg by mouth in the morning.     fluticasone (FLONASE) 50 MCG/ACT nasal spray Place 1 spray into both nostrils daily as  needed for allergies.     losartan (COZAAR) 25 MG tablet Take 50 mg by mouth daily.     magic mouthwash (lidocaine, diphenhydrAMINE, alum & mag hydroxide) suspension Swish and swallow 5 mLs by mouth 3 times daily as needed for mouth pain. 360 mL 1   meloxicam (MOBIC) 15 MG tablet Take 15 mg by mouth daily.     Potassium 99 MG TABS Take 99 mg by mouth at bedtime.     promethazine-dextromethorphan (PROMETHAZINE-DM) 6.25-15 MG/5ML syrup Take 2.5 mLs by mouth 4 (four) times daily as needed for cough. 118 mL 0   traZODone (DESYREL) 150 MG tablet Take 225 mg by mouth at bedtime.     venlafaxine XR (EFFEXOR-XR) 75 MG 24 hr capsule Take 75 mg by mouth daily with breakfast.     No current facility-administered medications for this visit.    PHYSICAL EXAMINATION: ECOG PERFORMANCE STATUS: {CHL ONC ECOG PS:[id number]}  There were no vitals filed for this visit. There were no vitals filed for this visit.  BREAST:*** No palpable masses or nodules in either right or left breasts. No palpable axillary supraclavicular or infraclavicular adenopathy no breast tenderness or nipple discharge. (exam performed in the presence of a chaperone)  LABORATORY DATA:  I have reviewed the data as listed    Latest Ref Rng & Units 11/27/2022    1:19 PM 10/11/2022    2:12 PM 10/04/2022    8:48 AM  CMP  Glucose 70 - 99 mg/dL 97  108  101   BUN 8 - 23 mg/dL '18  9  6   '$ Creatinine 0.44 - 1.00 mg/dL 0.91  0.91  0.81   Sodium 135 - 145 mmol/L 131  138  132   Potassium 3.5 - 5.1 mmol/L 4.3  3.7  3.6   Chloride 98 - 111 mmol/L 98  106  99   CO2 22 - 32 mmol/L '28  26  28   '$ Calcium 8.9 - 10.3 mg/dL 8.5  8.5  8.7   Total Protein 6.5 - 8.1 g/dL 6.2  6.0  5.7   Total Bilirubin 0.3 - 1.2 mg/dL 0.4  0.5  0.5   Alkaline Phos 38 - 126 U/L 92  96  102   AST 15 - 41 U/L '17  25  27   '$ ALT 0 - 44 U/L 17  35  28     Lab Results  Component Value Date   WBC 7.0 11/27/2022   HGB 11.3 (L) 11/27/2022   HCT 33.2 (L) 11/27/2022   MCV  81.2 11/27/2022   PLT 295 11/27/2022   NEUTROABS 5.2 11/27/2022    ASSESSMENT & PLAN:  No problem-specific Assessment & Plan notes found for this encounter.    No orders of the defined types were placed in this encounter.  The patient has a good understanding of the overall plan. she agrees with it. she will call with any problems that may develop before the next visit here. Total time spent: 30 mins including face to face time and time spent for planning, charting and co-ordination of care   Suzzette Righter, [place] 12/08/22    I Jerusalem Brownstein,  Geraldyne Barraclough am acting as a Education administrator for [employer]  ***

## 2022-12-11 ENCOUNTER — Other Ambulatory Visit: Payer: Self-pay

## 2022-12-11 MED ORDER — LIDOCAINE-PRILOCAINE 2.5-2.5 % EX CREA: 1.0000 | TOPICAL_CREAM | CUTANEOUS | 3 refills | Status: DC | PRN

## 2022-12-12 ENCOUNTER — Other Ambulatory Visit: Payer: Self-pay | Admitting: *Deleted

## 2022-12-12 MED ORDER — LIDOCAINE-PRILOCAINE 2.5-2.5 % EX CREA: 1.0000 | TOPICAL_CREAM | CUTANEOUS | 3 refills | Status: DC | PRN

## 2022-12-14 ENCOUNTER — Inpatient Hospital Stay: Payer: Medicare Other

## 2022-12-14 ENCOUNTER — Inpatient Hospital Stay (HOSPITAL_BASED_OUTPATIENT_CLINIC_OR_DEPARTMENT_OTHER): Payer: Medicare Other | Admitting: Hematology and Oncology

## 2022-12-14 ENCOUNTER — Inpatient Hospital Stay: Payer: Medicare Other | Attending: Hematology and Oncology

## 2022-12-14 VITALS — BP 164/74 | HR 69 | Resp 18

## 2022-12-14 VITALS — BP 157/81 | HR 79 | Temp 97.8°F | Resp 18 | Ht 66.0 in | Wt 149.2 lb

## 2022-12-14 DIAGNOSIS — Z923 Personal history of irradiation: Secondary | ICD-10-CM | POA: Diagnosis not present

## 2022-12-14 DIAGNOSIS — Z5112 Encounter for antineoplastic immunotherapy: Secondary | ICD-10-CM | POA: Diagnosis not present

## 2022-12-14 DIAGNOSIS — Z9012 Acquired absence of left breast and nipple: Secondary | ICD-10-CM | POA: Diagnosis not present

## 2022-12-14 DIAGNOSIS — C50412 Malignant neoplasm of upper-outer quadrant of left female breast: Secondary | ICD-10-CM

## 2022-12-14 DIAGNOSIS — Z171 Estrogen receptor negative status [ER-]: Secondary | ICD-10-CM | POA: Insufficient documentation

## 2022-12-14 DIAGNOSIS — R197 Diarrhea, unspecified: Secondary | ICD-10-CM | POA: Diagnosis not present

## 2022-12-14 DIAGNOSIS — Z95828 Presence of other vascular implants and grafts: Secondary | ICD-10-CM

## 2022-12-14 DIAGNOSIS — C7951 Secondary malignant neoplasm of bone: Secondary | ICD-10-CM | POA: Insufficient documentation

## 2022-12-14 LAB — CMP (CANCER CENTER ONLY)
ALT: 15 U/L (ref 0–44)
AST: 18 U/L (ref 15–41)
Albumin: 3.7 g/dL (ref 3.5–5.0)
Alkaline Phosphatase: 79 U/L (ref 38–126)
Anion gap: 5 (ref 5–15)
BUN: 12 mg/dL (ref 8–23)
CO2: 27 mmol/L (ref 22–32)
Calcium: 8.6 mg/dL — ABNORMAL LOW (ref 8.9–10.3)
Chloride: 103 mmol/L (ref 98–111)
Creatinine: 0.93 mg/dL (ref 0.44–1.00)
GFR, Estimated: >60 mL/min (ref >60)
Glucose, Bld: 104 mg/dL — ABNORMAL HIGH (ref 70–99)
Potassium: 4.3 mmol/L (ref 3.5–5.1)
Sodium: 135 mmol/L (ref 135–145)
Total Bilirubin: 0.4 mg/dL (ref 0.3–1.2)
Total Protein: 5.8 g/dL — ABNORMAL LOW (ref 6.5–8.1)

## 2022-12-14 LAB — CBC WITH DIFFERENTIAL (CANCER CENTER ONLY)
Abs Immature Granulocytes: 0.02 10*3/uL (ref 0.00–0.07)
Basophils Absolute: 0.1 10*3/uL (ref 0.0–0.1)
Basophils Relative: 1 %
Eosinophils Absolute: 0.5 10*3/uL (ref 0.0–0.5)
Eosinophils Relative: 7 %
HCT: 31.5 % — ABNORMAL LOW (ref 36.0–46.0)
Hemoglobin: 10.6 g/dL — ABNORMAL LOW (ref 12.0–15.0)
Immature Granulocytes: 0 %
Lymphocytes Relative: 15 %
Lymphs Abs: 1.1 10*3/uL (ref 0.7–4.0)
MCH: 29.2 pg (ref 26.0–34.0)
MCHC: 33.7 g/dL (ref 30.0–36.0)
MCV: 86.8 fL (ref 80.0–100.0)
Monocytes Absolute: 0.6 10*3/uL (ref 0.1–1.0)
Monocytes Relative: 7 %
Neutro Abs: 5.3 10*3/uL (ref 1.7–7.7)
Neutrophils Relative %: 70 %
Platelet Count: 305 10*3/uL (ref 150–400)
RBC: 3.63 MIL/uL — ABNORMAL LOW (ref 3.87–5.11)
RDW: 14.6 % (ref 11.5–15.5)
WBC Count: 7.7 10*3/uL (ref 4.0–10.5)
nRBC: 0 % (ref 0.0–0.2)

## 2022-12-14 MED ORDER — HEPARIN SOD (PORK) LOCK FLUSH 100 UNIT/ML IV SOLN
500.0000 [IU] | Freq: Once | INTRAVENOUS | Status: AC | PRN
  Administered 2022-12-14: 500 [IU]

## 2022-12-14 MED ORDER — ACETAMINOPHEN 325 MG PO TABS
650.0000 mg | ORAL_TABLET | Freq: Once | ORAL | Status: AC
  Administered 2022-12-14: 650 mg via ORAL
  Filled 2022-12-14: qty 2

## 2022-12-14 MED ORDER — SODIUM CHLORIDE 0.9 % IV SOLN: Freq: Once | INTRAVENOUS | Status: AC

## 2022-12-14 MED ORDER — SODIUM CHLORIDE 0.9 % IV SOLN
420.0000 mg | Freq: Once | INTRAVENOUS | Status: AC
  Administered 2022-12-14: 420 mg via INTRAVENOUS
  Filled 2022-12-14: qty 14

## 2022-12-14 MED ORDER — DIPHENHYDRAMINE HCL 25 MG PO CAPS
25.0000 mg | ORAL_CAPSULE | Freq: Once | ORAL | Status: AC
  Administered 2022-12-14: 25 mg via ORAL
  Filled 2022-12-14: qty 1

## 2022-12-14 MED ORDER — SODIUM CHLORIDE 0.9% FLUSH
10.0000 mL | INTRAVENOUS | Status: DC | PRN
  Administered 2022-12-14: 10 mL

## 2022-12-14 MED ORDER — TRASTUZUMAB-DKST CHEMO 150 MG IV SOLR
6.0000 mg/kg | Freq: Once | INTRAVENOUS | Status: AC
  Administered 2022-12-14: 420 mg via INTRAVENOUS
  Filled 2022-12-14: qty 20

## 2022-12-14 MED ORDER — SODIUM CHLORIDE 0.9% FLUSH
10.0000 mL | Freq: Once | INTRAVENOUS | Status: AC
  Administered 2022-12-14: 10 mL

## 2022-12-14 NOTE — Assessment & Plan Note (Addendum)
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  T4 vertebral body Bone scan 04/25/2022: Abnormal uptake in the sternum, right parietal region of calvarium, right posterior seventh rib, T5, left sternoclavicular joint   Treatment: Palliative chemotherapy with Taxotere [name] and Perjeta every 3 weeks x1 cycle given 05/05/2022 (discontinued for severe toxicities and hospitalization)   ---------------------------------------------------------------------------------------------------------------------------------------------- Chemotoxicities: Hospitalization 05/11/2022-05/18/2022: Convulsive syncope, chemo induced neutropenia, oral mucositis, chronic CHF, severe diarrhea with dehydration ECHO 06/26/22: EF 55-60% (follows with [facility])   Treatment plan:05/26/22 [name]. added Perjeta 06/21/22.   07/30/2022: Bone scan: Interval resolution/decreasing accumulation of multiple bone metastases 10/30/2022: CT CAP: Mildly prominent mediastinal lymph nodes are similar, no pulmonary metastasis, skeletal lesions increasing sclerosis, no visceral metastases   Radiology review: Patient is continuing to respond well to treatment. Continue the same treatment plan. Return every 3 weeks for [name] and Perjeta. Our plan is to obtain scans in 6 weeks

## 2022-12-14 NOTE — Progress Notes (Signed)
Pt declined staying for Perjeta 30 minute post-observation. Pt d/c to lobby and stable.

## 2022-12-15 ENCOUNTER — Telehealth: Payer: Self-pay | Admitting: Plastic Surgery

## 2022-12-15 NOTE — Telephone Encounter (Signed)
Pt called and stated that according to her oncologist she can move forward with sx, she mentioned that Dr Lovena Le said she needed to be off cemo, if Dr Lovena Le had any questions to please call her.

## 2022-12-18 ENCOUNTER — Telehealth: Payer: Self-pay | Admitting: Plastic Surgery

## 2022-12-18 NOTE — Telephone Encounter (Signed)
Pt called to inform provider that oncologist says it is ok to proceed with surgery.  She is not on chemo any longer but doing preventative medication.  Would like a call back from Dr. Lovena Le.

## 2022-12-21 NOTE — Telephone Encounter (Signed)
Spoke with Mrs Manfull today

## 2022-12-26 ENCOUNTER — Telehealth: Payer: Self-pay | Admitting: *Deleted

## 2022-12-26 NOTE — Telephone Encounter (Signed)
Received call from pt requesting MD visit be added to next appt.  Pt states she would like to further discuss reconstruction surgery and having to stop tx 3 weeks prior and 3 weeks post.  Appt scheduled.

## 2022-12-27 NOTE — Progress Notes (Signed)
Patient Care Team: [name], MD as PCP - General (Family Medicine) Cameron Sprang, MD as Consulting Physician (Neurology) Mauro Kaufmann, RN as Oncology Nurse Navigator Rockwell Germany, RN as Oncology Nurse Navigator Coralie Keens, MD as Consulting Physician (General Surgery) Nicholas Lose, MD as Consulting Physician (Hematology and Oncology) Eppie Gibson, MD as Attending Physician (Radiation Oncology)  DIAGNOSIS: No diagnosis found.  SUMMARY OF ONCOLOGIC HISTORY: Oncology History  Malignant neoplasm of upper-outer quadrant of left breast in female, estrogen receptor negative ([place])  02/10/2022 Initial Diagnosis   Palpable left breast masses and calcifications: 1.8 cm at 1:00 and 1.7 cm at 9:00, calcifications not biopsy.  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 Report date is 03/05/2022.  The CustomNext-Cancer+RNAinsight panel offered by [facility] includes sequencing and rearrangement analysis for the following 48 genes:  APC, ATM, AXIN2, BARD1, BMPR1A, BRCA1, BRCA2, BRIP1, CDH1, CDK4, CDKN2A, CHEK2, CTNNA1, DICER1, EGFR, EPCAM, GREM1, HOXB13, KIT, MEN1, MLH1, MSH2, MSH3, MSH6, MUTYH, NBN, NF1, NTHL1, PALB2, PDGFRA, PMS2, POLD1, POLE, PTEN, RAD50, RAD51C, RAD51D, SDHA, SDHB, SDHC, SDHD, SMAD4, SMARCA4, STK11, TP53, TSC1, TSC2, and VHL.  RNA data is routinely analyzed for use in variant interpretation for all genes.   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, LCIS, 5/5 lymph nodes positive with extranodal extension, margins  negative ER 0%, PR 0%, HER2 3+, Ki-67 20%   04/27/2022 Cancer Staging   Staging form: Breast, AJCC 8th Edition - Pathologic: Stage IIIA (pT2, pN2, cM0, G2, ER-, PR-, HER2+) - Signed by Nicholas Lose, MD on 04/27/2022 Histologic grading system: 3 grade system   05/05/2022 - 05/26/2022 Chemotherapy   Patient is on Treatment Plan : BREAST DOCEtaxel + Trastuzumab + Pertuzumab (THP) q21d x 8 cycles / Trastuzumab + Pertuzumab q21d x 4 cycles     05/26/2022 -  Chemotherapy   Patient is on Treatment Plan : BREAST MAINTENANCE Trastuzumab IV (6) or SQ (600) D1 q21d X 11 Cycles       CHIEF COMPLIANT: Follow up herceptin and Perjeta     INTERVAL HISTORY: Allison Pierce is a 77 y.o with the above-mentioned metastatic breast cancer currently on herceptin perjeta. She presents to the clinic for a follow-up.  ALLERGIES:  is allergic to gadolinium derivatives, atorvastatin calcium, azelaic acid, oxybutynin, prozac [fluoxetine hcl], ritalin [methylphenidate], rosuvastatin, and lyrica [pregabalin].  MEDICATIONS:  Current Outpatient Medications  Medication Sig Dispense Refill   amoxicillin-clavulanate (AUGMENTIN) 875-125 MG tablet Take 1 tablet by mouth 2 (two) times daily. 7 tablet 0   B Complex-C (B COMPLEX-VITAMIN C) CAPS Take 1 capsule by mouth daily.     buPROPion (WELLBUTRIN XL) 300 MG 24 hr tablet Take 300 mg by mouth every morning.     calcium carbonate (TUMS - DOSED IN MG ELEMENTAL CALCIUM) 500 MG chewable tablet Chew 2 tablets by mouth daily as needed for indigestion or heartburn.     cholecalciferol (VITAMIN D3) 25 MCG (1000 UNIT) tablet Take 1,000 Units by mouth daily.     clonazePAM (KLONOPIN) 1 MG tablet Take 1 mg by  mouth at bedtime.     Cyanocobalamin (VITAMIN B-12) 5000 MCG TBDP Take 5,000 Units by mouth daily.     diphenhydrAMINE (BENADRYL) 25 MG tablet Take 25 mg by mouth in the morning.     fluticasone (FLONASE) 50 MCG/ACT nasal spray Place 1 spray into both nostrils daily as  needed for allergies.     lidocaine-prilocaine (EMLA) cream Apply 1 Application topically as needed. Apply one application daily as needed to port a cath site. 30 g 3   losartan (COZAAR) 25 MG tablet Take 50 mg by mouth daily.     magic mouthwash (lidocaine, diphenhydrAMINE, alum & mag hydroxide) suspension Swish and swallow 5 mLs by mouth 3 times daily as needed for mouth pain. 360 mL 1   meloxicam (MOBIC) 15 MG tablet Take 15 mg by mouth daily.     Potassium 99 MG TABS Take 99 mg by mouth at bedtime.     promethazine-dextromethorphan (PROMETHAZINE-DM) 6.25-15 MG/5ML syrup Take 2.5 mLs by mouth 4 (four) times daily as needed for cough. 118 mL 0   traZODone (DESYREL) 150 MG tablet Take 225 mg by mouth at bedtime.     venlafaxine XR (EFFEXOR-XR) 75 MG 24 hr capsule Take 75 mg by mouth daily with breakfast.     No current facility-administered medications for this visit.    PHYSICAL EXAMINATION: ECOG PERFORMANCE STATUS: {CHL ONC ECOG PS:[phone]}  There were no vitals filed for this visit. There were no vitals filed for this visit.  BREAST:*** No palpable masses or nodules in either right or left breasts. No palpable axillary supraclavicular or infraclavicular adenopathy no breast tenderness or nipple discharge. (exam performed in the presence of a chaperone)  LABORATORY DATA:  I have reviewed the data as listed    Latest Ref Rng & Units 12/14/2022   10:59 AM 11/27/2022    1:19 PM 10/11/2022    2:12 PM  CMP  Glucose 70 - 99 mg/dL 104  97  108   BUN 8 - 23 mg/dL 12  18  9    Creatinine 0.44 - 1.00 mg/dL 0.93  0.91  0.91   Sodium 135 - 145 mmol/L 135  131  138   Potassium 3.5 - 5.1 mmol/L 4.3  4.3  3.7   Chloride 98 - 111 mmol/L 103  98  106   CO2 22 - 32 mmol/L 27  28  26    Calcium 8.9 - 10.3 mg/dL 8.6  8.5  8.5   Total Protein 6.5 - 8.1 g/dL 5.8  6.2  6.0   Total Bilirubin 0.3 - 1.2 mg/dL 0.4  0.4  0.5   Alkaline Phos 38 - 126 U/L 79  92  96   AST 15 - 41 U/L 18  17  25    ALT 0 -  44 U/L 15  17  35     Lab Results  Component Value Date   WBC 7.7 12/14/2022   HGB 10.6 (L) 12/14/2022   HCT 31.5 (L) 12/14/2022   MCV 86.8 12/14/2022   PLT 305 12/14/2022   NEUTROABS 5.3 12/14/2022    ASSESSMENT & PLAN:  No problem-specific Assessment & Plan notes found for this encounter.    No orders of the defined types were placed in this encounter.  The patient has a good understanding of the overall plan. she agrees with it. she will call with any problems that may develop before the next visit here. Total time spent: 30 mins including face to face time and  time spent for planning, charting and co-ordination of care   Suzzette Righter, [facility] 12/27/22    I Gardiner Coins am acting as a Education administrator for [employer]  ***

## 2022-12-28 ENCOUNTER — Other Ambulatory Visit (HOSPITAL_COMMUNITY): Payer: Self-pay | Admitting: Cardiology

## 2022-12-28 DIAGNOSIS — I5032 Chronic diastolic (congestive) heart failure: Secondary | ICD-10-CM

## 2022-12-28 NOTE — Progress Notes (Signed)
Echo order placed.

## 2023-01-02 ENCOUNTER — Telehealth: Payer: Self-pay | Admitting: *Deleted

## 2023-01-02 ENCOUNTER — Other Ambulatory Visit: Payer: Self-pay

## 2023-01-02 NOTE — Telephone Encounter (Signed)
Received VM from pt.  RN attempt x1 to return call.  No answer, LVM for pt to return call to the office.  °

## 2023-01-03 ENCOUNTER — Ambulatory Visit: Payer: Medicare Other | Admitting: Hematology and Oncology

## 2023-01-03 ENCOUNTER — Ambulatory Visit: Payer: Medicare Other

## 2023-01-04 ENCOUNTER — Telehealth: Payer: Self-pay | Admitting: Hematology and Oncology

## 2023-01-04 ENCOUNTER — Other Ambulatory Visit: Payer: Self-pay

## 2023-01-04 ENCOUNTER — Inpatient Hospital Stay: Payer: Medicare Other | Attending: Hematology and Oncology

## 2023-01-04 ENCOUNTER — Inpatient Hospital Stay (HOSPITAL_BASED_OUTPATIENT_CLINIC_OR_DEPARTMENT_OTHER): Payer: Medicare Other | Admitting: Hematology and Oncology

## 2023-01-04 ENCOUNTER — Encounter: Payer: Self-pay | Admitting: *Deleted

## 2023-01-04 VITALS — BP 163/91 | HR 73 | Resp 19

## 2023-01-04 VITALS — BP 103/89 | HR 85 | Temp 97.3°F | Resp 18 | Ht 66.0 in | Wt 145.5 lb

## 2023-01-04 DIAGNOSIS — C50412 Malignant neoplasm of upper-outer quadrant of left female breast: Secondary | ICD-10-CM

## 2023-01-04 DIAGNOSIS — Z171 Estrogen receptor negative status [ER-]: Secondary | ICD-10-CM

## 2023-01-04 DIAGNOSIS — C7951 Secondary malignant neoplasm of bone: Secondary | ICD-10-CM | POA: Insufficient documentation

## 2023-01-04 DIAGNOSIS — Z923 Personal history of irradiation: Secondary | ICD-10-CM | POA: Diagnosis not present

## 2023-01-04 DIAGNOSIS — Z9012 Acquired absence of left breast and nipple: Secondary | ICD-10-CM | POA: Insufficient documentation

## 2023-01-04 DIAGNOSIS — Z5112 Encounter for antineoplastic immunotherapy: Secondary | ICD-10-CM | POA: Diagnosis not present

## 2023-01-04 MED ORDER — ACETAMINOPHEN 325 MG PO TABS
650.0000 mg | ORAL_TABLET | Freq: Once | ORAL | Status: AC
  Administered 2023-01-04: 650 mg via ORAL
  Filled 2023-01-04: qty 2

## 2023-01-04 MED ORDER — TRASTUZUMAB-DKST CHEMO 150 MG IV SOLR
6.0000 mg/kg | Freq: Once | INTRAVENOUS | Status: AC
  Administered 2023-01-04: 420 mg via INTRAVENOUS
  Filled 2023-01-04: qty 20

## 2023-01-04 MED ORDER — SODIUM CHLORIDE 0.9% FLUSH
10.0000 mL | INTRAVENOUS | Status: DC | PRN
  Administered 2023-01-04: 10 mL

## 2023-01-04 MED ORDER — DIPHENHYDRAMINE HCL 25 MG PO CAPS
25.0000 mg | ORAL_CAPSULE | Freq: Once | ORAL | Status: AC
  Administered 2023-01-04: 25 mg via ORAL
  Filled 2023-01-04: qty 1

## 2023-01-04 MED ORDER — SODIUM CHLORIDE 0.9 % IV SOLN: Freq: Once | INTRAVENOUS | Status: AC

## 2023-01-04 MED ORDER — HEPARIN SOD (PORK) LOCK FLUSH 100 UNIT/ML IV SOLN
500.0000 [IU] | Freq: Once | INTRAVENOUS | Status: AC | PRN
  Administered 2023-01-04: 500 [IU]

## 2023-01-04 MED ORDER — SODIUM CHLORIDE 0.9 % IV SOLN
420.0000 mg | Freq: Once | INTRAVENOUS | Status: AC
  Administered 2023-01-04: 420 mg via INTRAVENOUS
  Filled 2023-01-04: qty 14

## 2023-01-04 NOTE — Assessment & Plan Note (Signed)
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  T4 vertebral body Bone scan 04/25/2022: Abnormal uptake in the sternum, right parietal region of calvarium, right posterior seventh rib, T5, left sternoclavicular joint   Treatment: Palliative chemotherapy with Taxotere [name] and Perjeta every 3 weeks x1 cycle given 05/05/2022 (discontinued for severe toxicities and hospitalization)   ---------------------------------------------------------------------------------------------------------------------------------------------- Chemotoxicities: Hospitalization 05/11/2022-05/18/2022: Convulsive syncope, chemo induced neutropenia, oral mucositis, chronic CHF, severe diarrhea with dehydration ECHO 06/26/22: EF 55-60% (follows with [facility])   Treatment plan:05/26/22 [name]. added Perjeta 06/21/22.   07/30/2022: Bone scan: Interval resolution/decreasing accumulation of multiple bone metastases 10/30/2022: CT CAP: Mildly prominent mediastinal lymph nodes are similar, no pulmonary metastasis, skeletal lesions increasing sclerosis, no visceral metastases    CT scans have been scheduled for 01/19/2023 Patient is interested in discussing reconstruction options.  Return every 3 weeks for [name] and Perjeta.

## 2023-01-04 NOTE — Progress Notes (Signed)
Per MD okay to treat with echo from 09/21/22.  Pt is scheduled for repeat echo 02/12/23.

## 2023-01-04 NOTE — Telephone Encounter (Signed)
Scheduled appointment per 4/4 los. Patient is aware of the made appointment.

## 2023-01-04 NOTE — Patient Instructions (Signed)
[facility]  Discharge Instructions: Thank you for choosing [facility] to provide your oncology and hematology care.   If you have a lab appointment with the [facility], please go directly to the [facility] and check in at the registration area.   Wear comfortable clothing and clothing appropriate for easy access to any Portacath or PICC line.   We strive to give you quality time with your provider. You may need to reschedule your appointment if you arrive late (15 or more minutes).  Arriving late affects you and other patients whose appointments are after yours.  Also, if you miss three or more appointments without notifying the office, you may be dismissed from the clinic at the provider's discretion.      For prescription refill requests, have your pharmacy contact our office and allow 72 hours for refills to be completed.    Today you received the following chemotherapy and/or immunotherapy agents Perjeta and Trastuzumab.    To help prevent nausea and vomiting after your treatment, we encourage you to take your nausea medication as directed.  BELOW ARE SYMPTOMS THAT SHOULD BE REPORTED IMMEDIATELY: *FEVER GREATER THAN 100.4 F (38 C) OR HIGHER *CHILLS OR SWEATING *NAUSEA AND VOMITING THAT IS NOT CONTROLLED WITH YOUR NAUSEA MEDICATION *UNUSUAL SHORTNESS OF BREATH *UNUSUAL BRUISING OR BLEEDING *URINARY PROBLEMS (pain or burning when urinating, or frequent urination) *BOWEL PROBLEMS (unusual diarrhea, constipation, pain near the anus) TENDERNESS IN MOUTH AND THROAT WITH OR WITHOUT PRESENCE OF ULCERS (sore throat, sores in mouth, or a toothache) UNUSUAL RASH, SWELLING OR PAIN  UNUSUAL VAGINAL DISCHARGE OR ITCHING   Items with * indicate a potential emergency and should be followed up as soon as possible or go to the Emergency Department if any problems should occur.  Please show the CHEMOTHERAPY ALERT CARD or IMMUNOTHERAPY ALERT  CARD at check-in to the Emergency Department and triage nurse.  Should you have questions after your visit or need to cancel or reschedule your appointment, please contact [facility]  Dept: [phone]  and follow the prompts.  Office hours are 8:00 a.m. to 4:30 p.m. Monday - Friday. Please note that voicemails left after 4:00 p.m. may not be returned until the following business day.  We are closed weekends and major holidays. You have access to a nurse at all times for urgent questions. Please call the main number to the clinic Dept: [phone] and follow the prompts.   For any non-urgent questions, you may also contact your provider using MyChart. We now offer e-Visits for anyone 13 and older to request care online for non-urgent symptoms. For details visit [email or web address].   Also download the MyChart app! Go to the app store, search "MyChart", open the app, select [facility], and log in with your MyChart username and password.

## 2023-01-04 NOTE — Progress Notes (Signed)
Pt declined staying for 30 minute post-observation following Perjeta infusion. Pt in stable condition and d/c to lobby.

## 2023-01-08 ENCOUNTER — Telehealth: Payer: Self-pay | Admitting: Hematology and Oncology

## 2023-01-08 NOTE — Telephone Encounter (Signed)
Rescheduled appointment per room/resource. Patient is aware of the changes made to her upcoming appointment. 

## 2023-01-10 ENCOUNTER — Encounter: Payer: Self-pay | Admitting: Hematology and Oncology

## 2023-01-12 ENCOUNTER — Ambulatory Visit (HOSPITAL_COMMUNITY)
Admission: RE | Admit: 2023-01-12 | Discharge: 2023-01-12 | Disposition: A | Discharge status: Home / Self Care | Payer: Medicare Other | Source: Ambulatory Visit | Attending: Hematology and Oncology | Admitting: Hematology and Oncology

## 2023-01-12 DIAGNOSIS — Z171 Estrogen receptor negative status [ER-]: Secondary | ICD-10-CM

## 2023-01-12 DIAGNOSIS — C50919 Malignant neoplasm of unspecified site of unspecified female breast: Secondary | ICD-10-CM | POA: Diagnosis not present

## 2023-01-12 DIAGNOSIS — C50412 Malignant neoplasm of upper-outer quadrant of left female breast: Secondary | ICD-10-CM | POA: Diagnosis not present

## 2023-01-12 DIAGNOSIS — J929 Pleural plaque without asbestos: Secondary | ICD-10-CM | POA: Diagnosis not present

## 2023-01-12 DIAGNOSIS — K7689 Other specified diseases of liver: Secondary | ICD-10-CM | POA: Diagnosis not present

## 2023-01-12 MED ORDER — IOHEXOL 300 MG/ML  SOLN
100.0000 mL | Freq: Once | INTRAMUSCULAR | Status: AC | PRN
  Administered 2023-01-12: 100 mL via INTRAVENOUS

## 2023-01-16 NOTE — Progress Notes (Signed)
Patient Care Team: Lupita Raider, MD as PCP - General (Family Medicine) Van Clines, MD as Consulting Physician (Neurology) Pershing Proud, RN as Oncology Nurse Navigator Donnelly Angelica, RN as Oncology Nurse Navigator Abigail Miyamoto, MD as Consulting Physician (General Surgery) Serena Croissant, MD as Consulting Physician (Hematology and Oncology) Lonie Peak, MD as Attending Physician (Radiation Oncology)  DIAGNOSIS: No diagnosis found.  SUMMARY OF ONCOLOGIC HISTORY: Oncology History  Malignant neoplasm of upper-outer quadrant of left breast in female, estrogen receptor negative  02/10/2022 Initial Diagnosis   Palpable left breast masses and calcifications: 1.8 cm at 1:00 and 1.7 cm at 9:00, calcifications not biopsy.  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 Report date is 03/05/2022.  The CustomNext-Cancer+RNAinsight panel offered by [facility] includes sequencing and rearrangement analysis for the following 48 genes:  APC, ATM, AXIN2, BARD1, BMPR1A, BRCA1, BRCA2, BRIP1, CDH1, CDK4, CDKN2A, CHEK2, CTNNA1, DICER1, EGFR, EPCAM, GREM1, HOXB13, KIT, MEN1, MLH1, MSH2, MSH3, MSH6, MUTYH, NBN, NF1, NTHL1, PALB2, PDGFRA, PMS2, POLD1, POLE, PTEN, RAD50, RAD51C, RAD51D, SDHA, SDHB, SDHC, SDHD, SMAD4, SMARCA4, STK11, TP53, TSC1, TSC2, and VHL.  RNA data is routinely analyzed for use in variant interpretation for all genes.   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, LCIS, 5/5 lymph nodes positive with extranodal extension, margins negative ER  0%, PR 0%, HER2 3+, Ki-67 20%   04/27/2022 Cancer Staging   Staging form: Breast, AJCC 8th Edition - Pathologic: Stage IIIA (pT2, pN2, cM0, G2, ER-, PR-, HER2+) - Signed by Serena Croissant, MD on 04/27/2022 Histologic grading system: 3 grade system   05/05/2022 - 05/26/2022 Chemotherapy   Patient is on Treatment Plan : BREAST DOCEtaxel + Trastuzumab + Pertuzumab (THP) q21d x 8 cycles / Trastuzumab + Pertuzumab q21d x 4 cycles     05/26/2022 -  Chemotherapy   Patient is on Treatment Plan : BREAST MAINTENANCE Trastuzumab IV (6) or SQ (600) D1 q21d X 11 Cycles       CHIEF COMPLIANT:   INTERVAL HISTORY: Allison Pierce is a   ALLERGIES:  is allergic to gadolinium derivatives, atorvastatin calcium, azelaic acid, oxybutynin, prozac [fluoxetine hcl], ritalin [methylphenidate], rosuvastatin, and lyrica [pregabalin].  MEDICATIONS:  Current Outpatient Medications  Medication Sig Dispense Refill   amoxicillin-clavulanate (AUGMENTIN) 875-125 MG tablet Take 1 tablet by mouth 2 (two) times daily. 7 tablet 0   B Complex-C (B COMPLEX-VITAMIN C) CAPS Take 1 capsule by mouth daily.     buPROPion (WELLBUTRIN XL) 300 MG 24 hr tablet Take 300 mg by mouth every morning.     calcium carbonate (TUMS - DOSED IN MG ELEMENTAL CALCIUM) 500 MG chewable tablet Chew 2 tablets by mouth daily as needed for indigestion or heartburn.     cholecalciferol (VITAMIN D3) 25 MCG (1000 UNIT) tablet Take 1,000 Units by mouth daily.     clonazePAM (KLONOPIN) 1 MG tablet Take 1 mg by mouth at bedtime.     Cyanocobalamin (VITAMIN B-12) 5000 MCG TBDP Take 5,000 Units by mouth daily.     diphenhydrAMINE (BENADRYL) 25 MG  tablet Take 25 mg by mouth in the morning.     fluticasone (FLONASE) 50 MCG/ACT nasal spray Place 1 spray into both nostrils daily as needed for allergies.     lidocaine-prilocaine (EMLA) cream Apply 1 Application topically as needed. Apply one application daily as needed to port a cath site. 30 g 3   losartan  (COZAAR) 25 MG tablet Take 50 mg by mouth daily.     magic mouthwash (lidocaine, diphenhydrAMINE, alum & mag hydroxide) suspension Swish and swallow 5 mLs by mouth 3 times daily as needed for mouth pain. 360 mL 1   meloxicam (MOBIC) 15 MG tablet Take 15 mg by mouth daily.     Potassium 99 MG TABS Take 99 mg by mouth at bedtime.     promethazine-dextromethorphan (PROMETHAZINE-DM) 6.25-15 MG/5ML syrup Take 2.5 mLs by mouth 4 (four) times daily as needed for cough. 118 mL 0   traZODone (DESYREL) 150 MG tablet Take 225 mg by mouth at bedtime.     venlafaxine XR (EFFEXOR-XR) 75 MG 24 hr capsule Take 75 mg by mouth daily with breakfast.     No current facility-administered medications for this visit.    PHYSICAL EXAMINATION: ECOG PERFORMANCE STATUS: {CHL ONC ECOG [phone]}  There were no vitals filed for this visit. There were no vitals filed for this visit.  BREAST:*** No palpable masses or nodules in either right or left breasts. No palpable axillary supraclavicular or infraclavicular adenopathy no breast tenderness or nipple discharge. (exam performed in the presence of a chaperone)  LABORATORY DATA:  I have reviewed the data as listed    Latest Ref Rng & Units 12/14/2022   10:59 AM 11/27/2022    1:19 PM 10/11/2022    2:12 PM  CMP  Glucose 70 - 99 mg/dL 867  97  619   BUN 8 - 23 mg/dL 12  18  9    Creatinine 0.44 - 1.00 mg/dL 5.09  3.26  7.12   Sodium 135 - 145 mmol/L 135  131  138   Potassium 3.5 - 5.1 mmol/L 4.3  4.3  3.7   Chloride 98 - 111 mmol/L 103  98  106   CO2 22 - 32 mmol/L 27  28  26    Calcium 8.9 - 10.3 mg/dL 8.6  8.5  8.5   Total Protein 6.5 - 8.1 g/dL 5.8  6.2  6.0   Total Bilirubin 0.3 - 1.2 mg/dL 0.4  0.4  0.5   Alkaline Phos 38 - 126 U/L 79  92  96   AST 15 - 41 U/L 18  17  25    ALT 0 - 44 U/L 15  17  35     Lab Results  Component Value Date   WBC 7.7 12/14/2022   HGB 10.6 (L) 12/14/2022   HCT 31.5 (L) 12/14/2022   MCV 86.8 12/14/2022   PLT 305  12/14/2022   NEUTROABS 5.3 12/14/2022    ASSESSMENT & PLAN:  No problem-specific Assessment & Plan notes found for this encounter.    No orders of the defined types were placed in this encounter.  The patient has a good understanding of the overall plan. she agrees with it. she will call with any problems that may develop before the next visit here. Total time spent: 30 mins including face to face time and time spent for planning, charting and co-ordination of care   Sherlyn Lick, CMA 01/16/23    I Linsey Hirota, Nasri Boakye am acting as a Neurosurgeon  for Dr.   ***

## 2023-01-18 ENCOUNTER — Inpatient Hospital Stay (HOSPITAL_BASED_OUTPATIENT_CLINIC_OR_DEPARTMENT_OTHER): Payer: Medicare Other | Admitting: Hematology and Oncology

## 2023-01-18 DIAGNOSIS — C50412 Malignant neoplasm of upper-outer quadrant of left female breast: Secondary | ICD-10-CM | POA: Diagnosis not present

## 2023-01-18 DIAGNOSIS — Z171 Estrogen receptor negative status [ER-]: Secondary | ICD-10-CM | POA: Diagnosis not present

## 2023-01-18 NOTE — Assessment & Plan Note (Signed)
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  T4 vertebral body Bone scan 04/25/2022: Abnormal uptake in the sternum, right parietal region of calvarium, right posterior seventh rib, T5, left sternoclavicular joint   Treatment: Palliative chemotherapy with Taxotere [name] and Perjeta every 3 weeks x1 cycle given 05/05/2022 (discontinued for severe toxicities and hospitalization)   ---------------------------------------------------------------------------------------------------------------------------------------------- Chemotoxicities: Hospitalization 05/11/2022-05/18/2022: Convulsive syncope, chemo induced neutropenia, oral mucositis, chronic CHF, severe diarrhea with dehydration ECHO 06/26/22: EF 55-60% (follows with [facility])   Treatment plan:05/26/22 [name]. added Perjeta 06/21/22.   07/30/2022: Bone scan: Interval resolution/decreasing accumulation of multiple bone metastases 10/30/2022: CT CAP: Mildly prominent mediastinal lymph nodes are similar, no pulmonary metastasis, skeletal lesions increasing sclerosis, no visceral metastases 01/16/2023: CT CAP: Stable prominent mediastinal lymph nodes, slight increase in the small nodes in the mesentery could be reactive, scattered bone metastases stable  Continue with the current treatment

## 2023-01-19 ENCOUNTER — Ambulatory Visit (HOSPITAL_COMMUNITY): Payer: Medicare Other

## 2023-01-23 ENCOUNTER — Telehealth: Payer: Self-pay | Admitting: *Deleted

## 2023-01-23 NOTE — Telephone Encounter (Signed)
Per MD, Dr. Ladona Ridgel requesting pt to hold treatment prior to reconstruction surgery.  MD states one round of treatment will need to be held and pt needing to notify our office once surgery is scheduled.  RN contacted pt to reach out to Dr. Bruna Potter office to set up surgery schedule and alert US of the date so we can hold one round of Trastuzumab prior.  Pt verbalized understanding.

## 2023-01-23 NOTE — Telephone Encounter (Signed)
Received call from pt stating her plastic surgeon Dr. Weyman Croon is requesting she stop Trastuzumab for several weeks prior to proceeding with reconstruction surgery.  Pt requesting MD to discuss in further detail with surgeon to formulate a plan for pt to undergo surgery.  RN will review with MD.

## 2023-01-24 ENCOUNTER — Telehealth: Payer: Self-pay | Admitting: *Deleted

## 2023-01-24 ENCOUNTER — Telehealth: Payer: Self-pay | Admitting: Plastic Surgery

## 2023-01-24 NOTE — Telephone Encounter (Signed)
Pt would like to know if provider and Dr. Pamelia Hoit have spoke and questions on when she can have surgery.

## 2023-01-24 NOTE — Telephone Encounter (Signed)
[name],  Yes, I have been in communication with Dr Pamelia Hoit several times. He is going to hold her medication for one round as soon as we have surgery scheduled.  Ree Kida

## 2023-01-24 NOTE — Telephone Encounter (Signed)
Spoke with pt to schedule sx and related appts 

## 2023-01-25 ENCOUNTER — Encounter: Payer: Self-pay | Admitting: Hematology and Oncology

## 2023-01-25 ENCOUNTER — Ambulatory Visit: Payer: Medicare Other

## 2023-01-25 ENCOUNTER — Ambulatory Visit: Payer: Medicare Other | Admitting: Hematology and Oncology

## 2023-01-25 ENCOUNTER — Inpatient Hospital Stay: Payer: Medicare Other

## 2023-01-25 ENCOUNTER — Other Ambulatory Visit: Payer: Self-pay

## 2023-01-25 VITALS — BP 145/76 | HR 67 | Temp 98.9°F | Resp 17 | Wt 144.0 lb

## 2023-01-25 DIAGNOSIS — Z923 Personal history of irradiation: Secondary | ICD-10-CM | POA: Diagnosis not present

## 2023-01-25 DIAGNOSIS — Z171 Estrogen receptor negative status [ER-]: Secondary | ICD-10-CM | POA: Diagnosis not present

## 2023-01-25 DIAGNOSIS — C50412 Malignant neoplasm of upper-outer quadrant of left female breast: Secondary | ICD-10-CM | POA: Diagnosis not present

## 2023-01-25 DIAGNOSIS — Z9012 Acquired absence of left breast and nipple: Secondary | ICD-10-CM | POA: Diagnosis not present

## 2023-01-25 DIAGNOSIS — Z5112 Encounter for antineoplastic immunotherapy: Secondary | ICD-10-CM | POA: Diagnosis not present

## 2023-01-25 DIAGNOSIS — C7951 Secondary malignant neoplasm of bone: Secondary | ICD-10-CM | POA: Diagnosis not present

## 2023-01-25 MED ORDER — SODIUM CHLORIDE 0.9 % IV SOLN
420.0000 mg | Freq: Once | INTRAVENOUS | Status: AC
  Administered 2023-01-25: 420 mg via INTRAVENOUS
  Filled 2023-01-25: qty 14

## 2023-01-25 MED ORDER — TRASTUZUMAB-DKST CHEMO 150 MG IV SOLR
6.0000 mg/kg | Freq: Once | INTRAVENOUS | Status: AC
  Administered 2023-01-25: 420 mg via INTRAVENOUS
  Filled 2023-01-25: qty 20

## 2023-01-25 MED ORDER — SODIUM CHLORIDE 0.9% FLUSH
10.0000 mL | INTRAVENOUS | Status: DC | PRN
  Administered 2023-01-25: 10 mL

## 2023-01-25 MED ORDER — SODIUM CHLORIDE 0.9 % IV SOLN: Freq: Once | INTRAVENOUS | Status: AC

## 2023-01-25 MED ORDER — DIPHENHYDRAMINE HCL 25 MG PO CAPS
25.0000 mg | ORAL_CAPSULE | Freq: Once | ORAL | Status: AC
  Administered 2023-01-25: 25 mg via ORAL
  Filled 2023-01-25: qty 1

## 2023-01-25 MED ORDER — HEPARIN SOD (PORK) LOCK FLUSH 100 UNIT/ML IV SOLN
500.0000 [IU] | Freq: Once | INTRAVENOUS | Status: AC | PRN
  Administered 2023-01-25: 500 [IU]

## 2023-01-25 MED ORDER — ACETAMINOPHEN 325 MG PO TABS
650.0000 mg | ORAL_TABLET | Freq: Once | ORAL | Status: AC
  Administered 2023-01-25: 650 mg via ORAL
  Filled 2023-01-25: qty 2

## 2023-01-25 NOTE — Patient Instructions (Signed)
[facility]  Discharge Instructions: Thank you for choosing [facility] to provide your oncology and hematology care.   If you have a lab appointment with the Cancer Center, please go directly to the Cancer Center and check in at the registration area.   Wear comfortable clothing and clothing appropriate for easy access to any Portacath or PICC line.   We strive to give you quality time with your provider. You may need to reschedule your appointment if you arrive late (15 or more minutes).  Arriving late affects you and other patients whose appointments are after yours.  Also, if you miss three or more appointments without notifying the office, you may be dismissed from the clinic at the provider's discretion.      For prescription refill requests, have your pharmacy contact our office and allow 72 hours for refills to be completed.    Today you received the following chemotherapy and/or immunotherapy agents: Pertuzumab and Trastuzumab      To help prevent nausea and vomiting after your treatment, we encourage you to take your nausea medication as directed.  BELOW ARE SYMPTOMS THAT SHOULD BE REPORTED IMMEDIATELY: *FEVER GREATER THAN 100.4 F (38 C) OR HIGHER *CHILLS OR SWEATING *NAUSEA AND VOMITING THAT IS NOT CONTROLLED WITH YOUR NAUSEA MEDICATION *UNUSUAL SHORTNESS OF BREATH *UNUSUAL BRUISING OR BLEEDING *URINARY PROBLEMS (pain or burning when urinating, or frequent urination) *BOWEL PROBLEMS (unusual diarrhea, constipation, pain near the anus) TENDERNESS IN MOUTH AND THROAT WITH OR WITHOUT PRESENCE OF ULCERS (sore throat, sores in mouth, or a toothache) UNUSUAL RASH, SWELLING OR PAIN  UNUSUAL VAGINAL DISCHARGE OR ITCHING   Items with * indicate a potential emergency and should be followed up as soon as possible or go to the Emergency Department if any problems should occur.  Please show the CHEMOTHERAPY ALERT CARD or IMMUNOTHERAPY  ALERT CARD at check-in to the Emergency Department and triage nurse.  Should you have questions after your visit or need to cancel or reschedule your appointment, please contact [facility]  Dept: [phone]  and follow the prompts.  Office hours are 8:00 a.m. to 4:30 p.m. Monday - Friday. Please note that voicemails left after 4:00 p.m. may not be returned until the following business day.  We are closed weekends and major holidays. You have access to a nurse at all times for urgent questions. Please call the main number to the clinic Dept: [phone] and follow the prompts.   For any non-urgent questions, you may also contact your provider using MyChart. We now offer e-Visits for anyone 63 and older to request care online for non-urgent symptoms. For details visit [email or web address].   Also download the MyChart app! Go to the app store, search "MyChart", open the app, select [place], and log in with your MyChart username and password.

## 2023-01-26 ENCOUNTER — Telehealth: Payer: Self-pay | Admitting: *Deleted

## 2023-01-26 ENCOUNTER — Encounter: Payer: Self-pay | Admitting: Hematology and Oncology

## 2023-01-26 NOTE — Telephone Encounter (Signed)
LVM to reschedule surgery per patient request

## 2023-01-29 NOTE — Telephone Encounter (Signed)
Thank you :)

## 2023-02-01 ENCOUNTER — Encounter: Payer: Medicare Other | Admitting: Surgical

## 2023-02-12 ENCOUNTER — Ambulatory Visit (HOSPITAL_COMMUNITY)
Admission: RE | Admit: 2023-02-12 | Discharge: 2023-02-12 | Disposition: A | Discharge status: Home / Self Care | Payer: Medicare Other | Source: Ambulatory Visit | Attending: Internal Medicine | Admitting: Internal Medicine

## 2023-02-12 ENCOUNTER — Encounter: Payer: Medicare Other | Admitting: Internal Medicine

## 2023-02-12 DIAGNOSIS — I5032 Chronic diastolic (congestive) heart failure: Secondary | ICD-10-CM | POA: Diagnosis not present

## 2023-02-12 DIAGNOSIS — Z09 Encounter for follow-up examination after completed treatment for conditions other than malignant neoplasm: Secondary | ICD-10-CM | POA: Insufficient documentation

## 2023-02-12 LAB — ECHOCARDIOGRAM COMPLETE
Area-P 1/2: 4.49 cm2
S' Lateral: 2.3 cm

## 2023-02-12 NOTE — Progress Notes (Signed)
  Echocardiogram 2D Echocardiogram has been performed.  Allison Pierce 02/12/2023, 1:45 PM

## 2023-02-15 ENCOUNTER — Encounter (HOSPITAL_COMMUNITY): Payer: Self-pay | Admitting: Internal Medicine

## 2023-02-15 ENCOUNTER — Inpatient Hospital Stay: Payer: Medicare Other | Attending: Hematology and Oncology

## 2023-02-15 ENCOUNTER — Ambulatory Visit (HOSPITAL_BASED_OUTPATIENT_CLINIC_OR_DEPARTMENT_OTHER)
Admission: RE | Admit: 2023-02-15 | Discharge: 2023-02-15 | Disposition: A | Discharge status: Home / Self Care | Payer: Medicare Other | Source: Ambulatory Visit | Attending: Internal Medicine | Admitting: Internal Medicine

## 2023-02-15 ENCOUNTER — Other Ambulatory Visit (HOSPITAL_COMMUNITY): Payer: Self-pay

## 2023-02-15 VITALS — BP 144/92 | HR 64 | Temp 98.1°F | Resp 18 | Wt 146.8 lb

## 2023-02-15 VITALS — BP 160/80 | HR 78 | Wt 147.6 lb

## 2023-02-15 DIAGNOSIS — T451X5A Adverse effect of antineoplastic and immunosuppressive drugs, initial encounter: Secondary | ICD-10-CM

## 2023-02-15 DIAGNOSIS — I110 Hypertensive heart disease with heart failure: Secondary | ICD-10-CM | POA: Insufficient documentation

## 2023-02-15 DIAGNOSIS — Z79899 Other long term (current) drug therapy: Secondary | ICD-10-CM | POA: Insufficient documentation

## 2023-02-15 DIAGNOSIS — Z803 Family history of malignant neoplasm of breast: Secondary | ICD-10-CM | POA: Insufficient documentation

## 2023-02-15 DIAGNOSIS — Z801 Family history of malignant neoplasm of trachea, bronchus and lung: Secondary | ICD-10-CM | POA: Insufficient documentation

## 2023-02-15 DIAGNOSIS — C50412 Malignant neoplasm of upper-outer quadrant of left female breast: Secondary | ICD-10-CM | POA: Diagnosis not present

## 2023-02-15 DIAGNOSIS — C50212 Malignant neoplasm of upper-inner quadrant of left female breast: Secondary | ICD-10-CM | POA: Diagnosis not present

## 2023-02-15 DIAGNOSIS — Z9012 Acquired absence of left breast and nipple: Secondary | ICD-10-CM | POA: Insufficient documentation

## 2023-02-15 DIAGNOSIS — R931 Abnormal findings on diagnostic imaging of heart and coronary circulation: Secondary | ICD-10-CM | POA: Diagnosis not present

## 2023-02-15 DIAGNOSIS — Z171 Estrogen receptor negative status [ER-]: Secondary | ICD-10-CM | POA: Insufficient documentation

## 2023-02-15 DIAGNOSIS — C7951 Secondary malignant neoplasm of bone: Secondary | ICD-10-CM | POA: Insufficient documentation

## 2023-02-15 DIAGNOSIS — E785 Hyperlipidemia, unspecified: Secondary | ICD-10-CM | POA: Insufficient documentation

## 2023-02-15 DIAGNOSIS — I427 Cardiomyopathy due to drug and external agent: Secondary | ICD-10-CM

## 2023-02-15 DIAGNOSIS — F419 Anxiety disorder, unspecified: Secondary | ICD-10-CM | POA: Insufficient documentation

## 2023-02-15 DIAGNOSIS — C50912 Malignant neoplasm of unspecified site of left female breast: Secondary | ICD-10-CM | POA: Insufficient documentation

## 2023-02-15 DIAGNOSIS — I10 Essential (primary) hypertension: Secondary | ICD-10-CM

## 2023-02-15 DIAGNOSIS — I5032 Chronic diastolic (congestive) heart failure: Secondary | ICD-10-CM | POA: Diagnosis not present

## 2023-02-15 DIAGNOSIS — Z5112 Encounter for antineoplastic immunotherapy: Secondary | ICD-10-CM | POA: Diagnosis not present

## 2023-02-15 MED ORDER — LOSARTAN POTASSIUM 100 MG PO TABS: 100.0000 mg | ORAL_TABLET | Freq: Every day | ORAL | 3 refills | Status: DC

## 2023-02-15 MED ORDER — DIPHENHYDRAMINE HCL 25 MG PO CAPS
25.0000 mg | ORAL_CAPSULE | Freq: Once | ORAL | Status: AC
  Administered 2023-02-15: 25 mg via ORAL
  Filled 2023-02-15: qty 1

## 2023-02-15 MED ORDER — ACETAMINOPHEN 325 MG PO TABS
650.0000 mg | ORAL_TABLET | Freq: Once | ORAL | Status: AC
  Administered 2023-02-15: 650 mg via ORAL
  Filled 2023-02-15: qty 2

## 2023-02-15 MED ORDER — TRASTUZUMAB-DKST CHEMO 150 MG IV SOLR
6.0000 mg/kg | Freq: Once | INTRAVENOUS | Status: AC
  Administered 2023-02-15: 420 mg via INTRAVENOUS
  Filled 2023-02-15: qty 20

## 2023-02-15 MED ORDER — SODIUM CHLORIDE 0.9 % IV SOLN
420.0000 mg | Freq: Once | INTRAVENOUS | Status: AC
  Administered 2023-02-15: 420 mg via INTRAVENOUS
  Filled 2023-02-15: qty 14

## 2023-02-15 MED ORDER — HEPARIN SOD (PORK) LOCK FLUSH 100 UNIT/ML IV SOLN
500.0000 [IU] | Freq: Once | INTRAVENOUS | Status: AC | PRN
  Administered 2023-02-15: 500 [IU]

## 2023-02-15 MED ORDER — SODIUM CHLORIDE 0.9 % IV SOLN: Freq: Once | INTRAVENOUS | Status: AC

## 2023-02-15 MED ORDER — SODIUM CHLORIDE 0.9% FLUSH
10.0000 mL | INTRAVENOUS | Status: DC | PRN
  Administered 2023-02-15: 10 mL

## 2023-02-15 NOTE — Progress Notes (Signed)
CARDIO-ONCOLOGY CLINIC NOTE  Referring Physician: Dr. Pamelia Hoit Primary Care: Lupita Raider, MD Primary Cardiologist: None   HPI:  Allison Pierce is a 77 y.o. female with HTN, HL, anxiety and left breast cancer referred by Dr. Pamelia Hoit for further evaluation of reduced EF on echocardiogram.   No previous cardiac history. Was diagnosed with left breast CA in 5/23. Stage 1A (ER/PR -, HER2+). Underwent left mastectomy 7/23.   Chemo: 8/23: Docetaxel + Trastuzumab + Pertuzumab (THP) q21d x 8 cycles / Trastuzumab + Pertuzumab q21d x 4 cycles  Maintenance: Trastuzumab IV (6) or SQ (600) D1 q21d X 11 Cycles   Echos: 03/01/22: EF 60-65% G2DD GLs -18.6% 06/26/22:  EF 55-60% GLS -14.1% 09/21/22 EF 60-65%   Echo 02/12/23 EF 60-65% Personally reviewed  Here for routine f/u with her husband. Remains on [name] and Perjeta. Doing well. No CP or SOB. Pending breast reconstruction in June.    Tolerating losartan and Toprol  Past Medical History:  Diagnosis Date   Anxiety    Cancer (HCC)    Depression    GERD (gastroesophageal reflux disease) OCCASIONALLY  TAKE TUMS   Human papilloma virus    Hyperlipidemia    Hypertension    UTI (urinary tract infection)    Vulvar lesion     Current Outpatient Medications  Medication Sig Dispense Refill   B Complex-C (B COMPLEX-VITAMIN C) CAPS Take 1 capsule by mouth daily.     buPROPion (WELLBUTRIN XL) 300 MG 24 hr tablet Take 300 mg by mouth every morning.     calcium carbonate (TUMS - DOSED IN MG ELEMENTAL CALCIUM) 500 MG chewable tablet Chew 2 tablets by mouth daily as needed for indigestion or heartburn.     cholecalciferol (VITAMIN D3) 25 MCG (1000 UNIT) tablet Take 1,000 Units by mouth daily.     clonazePAM (KLONOPIN) 1 MG tablet Take 1 mg by mouth at bedtime.     Cyanocobalamin (VITAMIN B-12) 5000 MCG TBDP Take 5,000 Units by mouth daily.     diphenhydrAMINE (BENADRYL) 25 MG tablet Take 25 mg by mouth in the morning.     fluticasone (FLONASE) 50  MCG/ACT nasal spray Place 1 spray into both nostrils daily as needed for allergies.     lidocaine-prilocaine (EMLA) cream Apply 1 Application topically as needed. Apply one application daily as needed to port a cath site. 30 g 3   losartan (COZAAR) 25 MG tablet Take 50 mg by mouth daily.     magic mouthwash (lidocaine, diphenhydrAMINE, alum & mag hydroxide) suspension Swish and swallow 5 mLs by mouth 3 times daily as needed for mouth pain. 360 mL 1   meloxicam (MOBIC) 15 MG tablet Take 15 mg by mouth daily.     Potassium 99 MG TABS Take 99 mg by mouth at bedtime.     promethazine-dextromethorphan (PROMETHAZINE-DM) 6.25-15 MG/5ML syrup Take 2.5 mLs by mouth 4 (four) times daily as needed for cough. 118 mL 0   traZODone (DESYREL) 150 MG tablet Take 225 mg by mouth at bedtime.     venlafaxine XR (EFFEXOR-XR) 75 MG 24 hr capsule Take 75 mg by mouth daily with breakfast.     No current facility-administered medications for this encounter.    Allergies  Allergen Reactions   Gadolinium Derivatives Itching and Other (See Comments)    Redness and itching, skins feels like it was burning. In the future pt will need pre med.   Atorvastatin Calcium Other (See Comments)    Unknown per Pt  Azelaic Acid Other (See Comments)    Unknown per Pt    Oxybutynin Other (See Comments)    Unknown per Pt    Prozac [Fluoxetine Hcl] Other (See Comments)    "Could not lift head up or get out of bed"    Ritalin [Methylphenidate] Other (See Comments)    Unknown per Pt    Rosuvastatin Other (See Comments)    Unknown per Pt    Lyrica [Pregabalin] Anxiety and Other (See Comments)    "Felt weird"      Social History   Socioeconomic History   Marital status: Married    Spouse name: Fayrene Fearing   Number of children: 1   Years of education: 12   Highest education level: Not on file  Occupational History   Not on file  Tobacco Use   Smoking status: Never   Smokeless tobacco: Never  Vaping Use   Vaping Use:  Never used  Substance and Sexual Activity   Alcohol use: Yes    Alcohol/week: 7.0 standard drinks of alcohol    Types: 7 Cans of beer per week   Drug use: Never   Sexual activity: Not Currently  Other Topics Concern   Not on file  Social History Narrative   Graduated HS      Right handed      Lives with husband   Social Determinants of Health   Financial Resource Strain: Low Risk  (02/22/2022)   Overall Financial Resource Strain (CARDIA)    Difficulty of Paying Living Expenses: Not hard at all  Food Insecurity: No Food Insecurity (02/22/2022)   Hunger Vital Sign    Worried About Running Out of Food in the Last Year: Never true    Ran Out of Food in the Last Year: Never true  Transportation Needs: No Transportation Needs (02/22/2022)   PRAPARE - Administrator, Civil Service (Medical): No    Lack of Transportation (Non-Medical): No  Physical Activity: Not on file  Stress: Not on file  Social Connections: Not on file  Intimate Partner Violence: Not on file      Family History  Problem Relation Age of Onset   Lung cancer Mother 49       she smoked   Prostate cancer Brother 4   Breast cancer Cousin        paternal first cousin   Breast cancer Cousin        paternal first cousin    Vitals:   02/15/23 1540  BP: (!) 160/80  Pulse: 78  SpO2: 97%  Weight: 67 kg (147 lb 9.6 oz)    PHYSICAL EXAM: General:  Sitting in chair  No resp difficulty HEENT: normal Neck: supple. no JVD. Prominent port-a-cath Carotids 2+ bilat; no bruits. No lymphadenopathy or thryomegaly appreciated. Cor: PMI nondisplaced. Regular rate & rhythm. No rubs, gallops or murmurs. Lungs: clear Abdomen: soft, nontender, nondistended. No hepatosplenomegaly. No bruits or masses. Good bowel sounds. Extremities: no cyanosis, clubbing, rash, edema Neuro: alert & orientedx3, cranial nerves grossly intact. moves all 4 extremities w/o difficulty. Affect pleasant   ASSESSMENT & PLAN:  1.  Abnormal echocardiogram, potential herceptin cardiotoxicity - Echo 03/01/22: EF 60-65% G2DD GLs -18.6% - Echo 06/26/22: EF 55-60% GLS -14.1% - I have reviewed both her pre-chemo echocardiogram and her most recent echo side-by-side. Although EF on the most recent one may be slightly less vigorous than her pre-chemo echo it is still well within the normal range. Strain imaging is not ideal  due to poor endocardial tracking on most recent echo but also suggests a possible slight decrease in contractile force - Although changes are minor it does fit the time course for potential herceptin cardiotoxicity.  - Echo 09/21/22 EF 60-65% (completely normal)  - Echo 09/21/22 EF 60-65%  - Echo 02/12/23 EF 60-65% Personally reviewed - Continue losartan and Toprol  - Continue echos q3 months with on H/P. After 1 year can switch surveillance to q4 months if EF is stable   2. Left Breast Cancer, stage IV with bony mets - diagnosed 5/23. Stage 1A (ER/PR -, HER2+).  - s/p left mastectomy 7/23.  - s/p 8/23: Docetaxel + Trastuzumab + Pertuzumab (THP) q21d x 8 cycles / Trastuzumab + Pertuzumab q21d x 4 cycles  - s/pTrastuzumab IV (6) or SQ (600) D1 q21d X 11 Cycles  - Pending breast reconstruction (ok to proceed from cardiac standpoint) - Echo stable today (reviewed images personally) - Continue [name]/Perjeta indefinitely  3. Hypertension - BP elevated  - Increase losartan to 100 daily  Arvilla Meres, MD  3:56 PM

## 2023-02-15 NOTE — Patient Instructions (Signed)
[facility]  Discharge Instructions: Thank you for choosing [facility] to provide your oncology and hematology care.   If you have a lab appointment with the Cancer Center, please go directly to the Cancer Center and check in at the registration area.   Wear comfortable clothing and clothing appropriate for easy access to any Portacath or PICC line.   We strive to give you quality time with your provider. You may need to reschedule your appointment if you arrive late (15 or more minutes).  Arriving late affects you and other patients whose appointments are after yours.  Also, if you miss three or more appointments without notifying the office, you may be dismissed from the clinic at the provider's discretion.      For prescription refill requests, have your pharmacy contact our office and allow 72 hours for refills to be completed.    Today you received the following chemotherapy and/or immunotherapy agents: Trastuzumab, Pertuzumab.      To help prevent nausea and vomiting after your treatment, we encourage you to take your nausea medication as directed.  BELOW ARE SYMPTOMS THAT SHOULD BE REPORTED IMMEDIATELY: *FEVER GREATER THAN 100.4 F (38 C) OR HIGHER *CHILLS OR SWEATING *NAUSEA AND VOMITING THAT IS NOT CONTROLLED WITH YOUR NAUSEA MEDICATION *UNUSUAL SHORTNESS OF BREATH *UNUSUAL BRUISING OR BLEEDING *URINARY PROBLEMS (pain or burning when urinating, or frequent urination) *BOWEL PROBLEMS (unusual diarrhea, constipation, pain near the anus) TENDERNESS IN MOUTH AND THROAT WITH OR WITHOUT PRESENCE OF ULCERS (sore throat, sores in mouth, or a toothache) UNUSUAL RASH, SWELLING OR PAIN  UNUSUAL VAGINAL DISCHARGE OR ITCHING   Items with * indicate a potential emergency and should be followed up as soon as possible or go to the Emergency Department if any problems should occur.  Please show the CHEMOTHERAPY ALERT CARD or IMMUNOTHERAPY  ALERT CARD at check-in to the Emergency Department and triage nurse.  Should you have questions after your visit or need to cancel or reschedule your appointment, please contact [facility]  Dept: [phone]  and follow the prompts.  Office hours are 8:00 a.m. to 4:30 p.m. Monday - Friday. Please note that voicemails left after 4:00 p.m. may not be returned until the following business day.  We are closed weekends and major holidays. You have access to a nurse at all times for urgent questions. Please call the main number to the clinic Dept: [phone] and follow the prompts.   For any non-urgent questions, you may also contact your provider using MyChart. We now offer e-Visits for anyone 18 and older to request care online for non-urgent symptoms. For details visit [email or web address].   Also download the MyChart app! Go to the app store, search "MyChart", open the app, select [place], and log in with your MyChart username and password.  

## 2023-02-15 NOTE — Patient Instructions (Signed)
INCREASE Losartan to 100 mg daily.  Your physician has requested that you have an echocardiogram. Echocardiography is a painless test that uses sound waves to create images of your heart. It provides your doctor with information about the size and shape of your heart and how well your heart's chambers and valves are working. This procedure takes approximately one hour. There are no restrictions for this procedure. Please do NOT wear cologne, perfume, aftershave, or lotions (deodorant is allowed). Please arrive 15 minutes prior to your appointment time.  Your physician recommends that you schedule a follow-up appointment in: 3 months  If you have any questions or concerns before your next appointment please send [place] a message through [place] or call our office at [phone].    TO LEAVE A MESSAGE FOR THE NURSE SELECT OPTION 2, PLEASE LEAVE A MESSAGE INCLUDING: YOUR NAME DATE OF BIRTH CALL BACK NUMBER REASON FOR CALL**this is important as we prioritize the call backs  YOU WILL RECEIVE A CALL BACK THE SAME DAY AS LONG AS YOU CALL BEFORE 4:00 PM  At the [facility], you and your health needs are our priority. As part of our continuing mission to provide you with exceptional heart care, we have created designated Provider Care Teams. These Care Teams include your primary Cardiologist (physician) and Advanced Practice Providers (APPs- Physician Assistants and Nurse Practitioners) who all work together to provide you with the care you need, when you need it.   You may see any of the following providers on your designated Care Team at your next follow up: Dr Arvilla Meres Dr Marca Ancona Dr. Marcos Eke, NP Robbie Lis, [place] [facility] [place], [place] Brynda Peon, NP Karle Plumber, PharmD   Please be sure to bring in all your medications bottles to every appointment.    Thank you for choosing [facility]

## 2023-02-16 ENCOUNTER — Other Ambulatory Visit: Payer: Self-pay

## 2023-02-16 ENCOUNTER — Other Ambulatory Visit (HOSPITAL_COMMUNITY): Payer: Self-pay

## 2023-02-16 MED ORDER — LOSARTAN POTASSIUM 100 MG PO TABS: 100.0000 mg | ORAL_TABLET | Freq: Every day | ORAL | 3 refills | Status: DC

## 2023-02-19 ENCOUNTER — Encounter: Payer: Self-pay | Admitting: Student

## 2023-02-19 ENCOUNTER — Ambulatory Visit (INDEPENDENT_AMBULATORY_CARE_PROVIDER_SITE_OTHER): Payer: Medicare Other | Admitting: Student

## 2023-02-19 VITALS — BP 143/78 | HR 87 | Ht 66.0 in | Wt 141.0 lb

## 2023-02-19 DIAGNOSIS — Z9889 Other specified postprocedural states: Secondary | ICD-10-CM

## 2023-02-19 MED ORDER — ONDANSETRON HCL 4 MG PO TABS: 4.0000 mg | ORAL_TABLET | Freq: Three times a day (TID) | ORAL | 0 refills | Status: DC | PRN

## 2023-02-19 MED ORDER — SULFAMETHOXAZOLE-TRIMETHOPRIM 800-160 MG PO TABS: 1.0000 | ORAL_TABLET | Freq: Two times a day (BID) | ORAL | 0 refills | Status: AC

## 2023-02-19 MED ORDER — HYDROCODONE-ACETAMINOPHEN 5-325 MG PO TABS: 1.0000 | ORAL_TABLET | Freq: Four times a day (QID) | ORAL | 0 refills | Status: DC | PRN

## 2023-02-19 NOTE — Progress Notes (Signed)
Patient ID: Allison Pierce, female    DOB: 1945-10-05, 77 y.o.   MRN: [id number]  Chief Complaint  Patient presents with   Pre-op Exam      ICD-10-CM   1. History of breast reconstruction  Z98.890        History of Present Illness: Allison Pierce is a 77 y.o.  female  with a history of breast cancer.  She presents for preoperative evaluation for upcoming procedure, removal of left breast tissue expander and placement of permanent implant, scheduled for 03/16/2023 with Dr. Ladona Ridgel.  The patient has not had problems with anesthesia.  Patient reports she sees a cardiologist.  She states she recently had an echo which was normal.  She denies taking any blood thinners.  She denies smoking.  Patient denies taking any birth control or hormone replacement.  She denies any history of miscarriages.  She denies any personal family history of blood clots or clotting diseases.  She denies any recent surgeries, traumas, infections or hospitalizations.  She denies any history of stroke or heart attack.  Patient does currently have a port in place.  Patient denies any Crohn's disease or ulcerative colitis.  She denies any COPD or asthma.  Patient does have history of breast cancer.  Patient denies any varicosities or lower extremities.  She denies any recent fevers, chills or changes in her health.  Patient states that she receives chemotherapy regularly every 3 weeks.  Patient is receiving [name] and Perjeta.  She reports her oncologist is Dr. Pamelia Hoit.  She denies any history of radiation.    Summary of Previous Visit: Patient was seen by Dr. Ladona Ridgel on 12/06/2022.  Patient reported she had an expander placed after mastectomy for breast cancer and would like to have the tissue expander removed and an implant placed.  Patient at this time reported she was still on maintenance chemotherapy due to widespread metastatic disease.  Patient stated she is unlikely to come off of chemotherapy.  On  exam, patient was noted to have a well-healed incision and tissue expander which was easily palpable on the left side.  Several options were discussed with the patient and the patient opted to move forward with expander removal and implant placement.  Job: Does not work at this time  PMH Significant for: Hypertension, GERD, anxiety/depression, breast cancer   Past Medical History: Allergies: Allergies  Allergen Reactions   Gadolinium Derivatives Itching and Other (See Comments)    Redness and itching, skins feels like it was burning. In the future pt will need pre med.   Atorvastatin Calcium Other (See Comments)    Unknown per Pt     Azelaic Acid Other (See Comments)    Unknown per Pt    Oxybutynin Other (See Comments)    Unknown per Pt    Prozac [Fluoxetine Hcl] Other (See Comments)    "Could not lift head up or get out of bed"    Ritalin [Methylphenidate] Other (See Comments)    Unknown per Pt    Rosuvastatin Other (See Comments)    Unknown per Pt    Lyrica [Pregabalin] Anxiety and Other (See Comments)    "Felt weird"    Current Medications:  Current Outpatient Medications:    B Complex-C (B COMPLEX-VITAMIN C) CAPS, Take 1 capsule by mouth daily., Disp: , Rfl:    buPROPion (WELLBUTRIN XL) 300 MG 24 hr tablet, Take 300 mg by mouth every morning., Disp: , Rfl:    calcium carbonate (  TUMS - DOSED IN MG ELEMENTAL CALCIUM) 500 MG chewable tablet, Chew 2 tablets by mouth daily as needed for indigestion or heartburn., Disp: , Rfl:    cholecalciferol (VITAMIN D3) 25 MCG (1000 UNIT) tablet, Take 1,000 Units by mouth daily., Disp: , Rfl:    clonazePAM (KLONOPIN) 1 MG tablet, Take 1 mg by mouth at bedtime., Disp: , Rfl:    Cyanocobalamin (VITAMIN B-12) 5000 MCG TBDP, Take 5,000 Units by mouth daily., Disp: , Rfl:    diphenhydrAMINE (BENADRYL) 25 MG tablet, Take 25 mg by mouth in the morning., Disp: , Rfl:    fluticasone (FLONASE) 50 MCG/ACT nasal spray, Place 1 spray into both nostrils  daily as needed for allergies., Disp: , Rfl:    lidocaine-prilocaine (EMLA) cream, Apply 1 Application topically as needed. Apply one application daily as needed to port a cath site., Disp: 30 g, Rfl: 3   losartan (COZAAR) 100 MG tablet, Take 1 tablet (100 mg total) by mouth daily., Disp: 90 tablet, Rfl: 3   magic mouthwash (lidocaine, diphenhydrAMINE, alum & mag hydroxide) suspension, Swish and swallow 5 mLs by mouth 3 times daily as needed for mouth pain., Disp: 360 mL, Rfl: 1   meloxicam (MOBIC) 15 MG tablet, Take 15 mg by mouth daily., Disp: , Rfl:    Potassium 99 MG TABS, Take 99 mg by mouth at bedtime., Disp: , Rfl:    promethazine-dextromethorphan (PROMETHAZINE-DM) 6.25-15 MG/5ML syrup, Take 2.5 mLs by mouth 4 (four) times daily as needed for cough., Disp: 118 mL, Rfl: 0   traZODone (DESYREL) 150 MG tablet, Take 225 mg by mouth at bedtime., Disp: , Rfl:    venlafaxine XR (EFFEXOR-XR) 75 MG 24 hr capsule, Take 75 mg by mouth daily with breakfast., Disp: , Rfl:   Past Medical Problems: Past Medical History:  Diagnosis Date   Anxiety    Cancer (HCC)    Depression    GERD (gastroesophageal reflux disease) OCCASIONALLY  TAKE TUMS   Human papilloma virus    Hyperlipidemia    Hypertension    UTI (urinary tract infection)    Vulvar lesion     Past Surgical History: Past Surgical History:  Procedure Laterality Date   ABDOMINAL HYSTERECTOMY  1992   W/ SALPINGO-OOPHORECTOMY   ANTERIOR CERVICAL DECOMP/DISCECTOMY FUSION  01-18-2009  DR POOLE   C4  - C6   AUGMENTATION MAMMAPLASTY Bilateral    BREAST BIOPSY Right    BREAST BIOPSY Left 02/10/2022   x2   BREAST ENHANCEMENT SURGERY  2011   BREAST EXCISIONAL BIOPSY Left    BREAST RECONSTRUCTION WITH PLACEMENT OF TISSUE EXPANDER AND FLEX HD (ACELLULAR HYDRATED DERMIS) Left 04/07/2022   Procedure: BREAST RECONSTRUCTION WITH PLACEMENT OF TISSUE EXPANDER AND FLEX HD (ACELLULAR HYDRATED DERMIS);  Surgeon: Allena Napoleon, MD;  Location: [facility] OR;   Service: Plastics;  Laterality: Left;   BREAST SURGERY  02-04-2011  dr Jamey Ripa   EXCISION LEFT BREAST MASS--  CALCIFICATION   EYE SURGERY Bilateral    cataract removal   MASTECTOMY W/ SENTINEL NODE BIOPSY Left 04/07/2022   Procedure: LEFT MASTECTOMY WITH SENTINEL NODE BIOPSY;  Surgeon: Abigail Miyamoto, MD;  Location: [facility] OR;  Service: General;  Laterality: Left;  LMA   PORTACATH PLACEMENT N/A 04/07/2022   Procedure: PORT-A-CATH INSERTION WITH ULTRASOUND GUIDANCE;  Surgeon: Abigail Miyamoto, MD;  Location: [facility] OR;  Service: General;  Laterality: N/A;   VULVAR LESION REMOVAL  09/10/2012   Procedure: VULVAR LESION;  Surgeon: Gretta Cool, MD;  Location: [facility];  Service: Gynecology;  Laterality: N/A;  WIDE EXCISION OF VULVAR LESION    Social History: Social History   Socioeconomic History   Marital status: Married    Spouse name: Fayrene Fearing   Number of children: 1   Years of education: 12   Highest education level: Not on file  Occupational History   Not on file  Tobacco Use   Smoking status: Never   Smokeless tobacco: Never  Vaping Use   Vaping Use: Never used  Substance and Sexual Activity   Alcohol use: Yes    Alcohol/week: 7.0 standard drinks of alcohol    Types: 7 Cans of beer per week   Drug use: Never   Sexual activity: Not Currently  Other Topics Concern   Not on file  Social History Narrative   Graduated HS      Right handed      Lives with husband   Social Determinants of Health   Financial Resource Strain: Low Risk  (02/22/2022)   Overall Financial Resource Strain (CARDIA)    Difficulty of Paying Living Expenses: Not hard at all  Food Insecurity: No Food Insecurity (02/22/2022)   Hunger Vital Sign    Worried About Running Out of Food in the Last Year: Never true    Ran Out of Food in the Last Year: Never true  Transportation Needs: No Transportation Needs (02/22/2022)   PRAPARE - Administrator, Civil Service (Medical): No    Lack  of Transportation (Non-Medical): No  Physical Activity: Not on file  Stress: Not on file  Social Connections: Not on file  Intimate Partner Violence: Not on file    Family History: Family History  Problem Relation Age of Onset   Lung cancer Mother 46       she smoked   Prostate cancer Brother 56   Breast cancer Cousin        paternal first cousin   Breast cancer Cousin        paternal first cousin    Review of Systems: Denies any fevers, chills or changes in her health  Physical Exam: Vital Signs BP (!) 143/78 (BP Location: Left Arm, Patient Position: Sitting, Cuff Size: Normal)   Pulse 87   Ht 5\' 6"  (1.676 m)   Wt 141 lb (64 kg)   SpO2 96%   BMI 22.76 kg/m   Physical Exam  Constitutional:      General: Not in acute distress.    Appearance: Normal appearance. Not ill-appearing.  HENT:     Head: Normocephalic and atraumatic.  Eyes:     Pupils: Pupils are equal, round Neck:     Musculoskeletal: Normal range of motion.  Cardiovascular:     Rate and Rhythm: Normal rate Pulmonary:     Effort: Pulmonary effort is normal. No respiratory distress.  Musculoskeletal: Normal range of motion.  Skin:    General: Skin is warm and dry.     Findings: No erythema or rash.  Neurological:     Mental Status: Alert and oriented to person, place, and time. Mental status is at baseline.  Psychiatric:        Mood and Affect: Mood normal.        Behavior: Behavior normal.    Assessment/Plan: The patient is scheduled for removal of left breast tissue expander and placement of silicone implant with Dr. Ladona Ridgel.  Risks, benefits, and alternatives of procedure discussed, questions answered and consent obtained.    Smoking Status:  Non-smoker; Counseling Given?  N/A Last Mammogram: 02/24/2022; Results: BI-RADS Category 1 negative  Caprini Score: 9; Risk Factors include: Age, Port-A-Cath in place, history of cancer, and length of planned surgery. Recommendation for mechanical and  possible pharmacologic prophylaxis. Encourage early ambulation.  Will discuss the possibility of postoperative Lovenox with Dr. Ladona Ridgel.  Pictures obtained: @consult   Post-op Rx sent to pharmacy: Hydrocodone, Zofran, Bactrim   I instructed the patient to hold her losartan the morning of surgery.  I also instructed her to hold her meloxicam 1 week prior to surgery.  I discussed with the patient that she cannot take ibuprofen and meloxicam at the same time after surgery for pain control.  I also discussed with the patient that she should hold any multivitamins and supplements at least 1 week prior to surgery.  Patient expressed understanding.  Patient states she is taken hydrocodone in the past without any issues.  Will send clearance to patient's cardiologist for upcoming surgery and we will also send clearance to patient's oncologist to hold [name] and Perjeta 3 weeks before and 3 weeks after surgery.  Patient was provided with the [facility] breast implant registry checklist, Mentor implant checklist, and General Surgical Risk consent document and Pain Medication Agreement prior to their appointment.  They had adequate time to read through the risk consent documents and Pain Medication Agreement. We also discussed them in person together during this preop appointment. All of their questions were answered to their satisfaction.  Recommended calling if they have any further questions.  Risk consent form and Pain Medication Agreement to be scanned into patient's chart.  The consent was obtained with risks and complications reviewed which included bleeding, pain, scar, infection and the risk of anesthesia.  The patients questions were answered to the patients expressed satisfaction.   I discussed with the patient that given that she receives chemotherapy fairly regularly, this may put her at increased risks of delayed wound healing and complications with healing.  Patient expressed  understanding.   Electronically signed by: Laurena Spies, PA-C 02/19/2023 4:59 PM

## 2023-02-20 ENCOUNTER — Telehealth: Payer: Self-pay | Admitting: *Deleted

## 2023-02-20 ENCOUNTER — Encounter: Payer: Self-pay | Admitting: *Deleted

## 2023-02-20 NOTE — Telephone Encounter (Signed)
Surgical clearance form received from Dr. Pamelia Hoit. Pt is medically cleared for surgery and indicated: "yes, treatment will be held" Re: [name] and Perjeta. Copy forwarded to Caroline More, Pa-c and to batch scan

## 2023-02-20 NOTE — Progress Notes (Signed)
Received surgery clearance from Dr. Trenton Founds office requesting pt [name]/ Perjeta to be held 3 weeks prior to surgery on 03/16/23 and resume 3 weeks post surgery.  RN notified MD who verbalized understanding and signed/ successfully faxed form to [phone].  Message sent to scheduling team to push out 03/12/23 appt to be the week of 04/09/23.  Pt notified as well and verbalized understanding.

## 2023-02-20 NOTE — Telephone Encounter (Signed)
Stat request for surgical clearance and for Allison Pierce to hold [name] and Perjeta for 3 weeks pre and post surgery (starting 02/23/23) faxed to Dr. Serena Croissant with confirmation received [phone]

## 2023-02-21 ENCOUNTER — Telehealth: Payer: Self-pay | Admitting: Hematology and Oncology

## 2023-02-21 ENCOUNTER — Telehealth: Payer: Self-pay | Admitting: *Deleted

## 2023-02-21 NOTE — Telephone Encounter (Signed)
Rescheduled and scheduled appointments per WQ. Patient is aware of the changes made to her upcoming appointments as well as the made appointments.

## 2023-02-21 NOTE — Telephone Encounter (Signed)
Received call from pt with complaint of bright red blood with bowel movement this morning.  Pt denies abdominal pain or fatigue at this time.  Per MD needing to monitor and contact our office if symptoms continue.  Pt notified and verbalized understanding.

## 2023-02-27 ENCOUNTER — Encounter: Payer: Self-pay | Admitting: Student

## 2023-02-27 NOTE — Progress Notes (Signed)
Clearance to hold [name] and Perjeta for 3 weeks before and 3 weeks after surgery was received from Dr. Pamelia Hoit.  Per Dr. Pamelia Hoit, patient is medically cleared for surgery.

## 2023-02-27 NOTE — H&P (View-Only) (Signed)
Clearance to hold [name] and Perjeta for 3 weeks before and 3 weeks after surgery was received from Dr. Gudena.  Per Dr. Gudena, patient is medically cleared for surgery. 

## 2023-03-01 ENCOUNTER — Other Ambulatory Visit: Payer: Self-pay

## 2023-03-07 ENCOUNTER — Other Ambulatory Visit: Payer: Self-pay

## 2023-03-07 ENCOUNTER — Encounter (HOSPITAL_BASED_OUTPATIENT_CLINIC_OR_DEPARTMENT_OTHER): Payer: Self-pay | Admitting: Plastic Surgery

## 2023-03-07 ENCOUNTER — Encounter: Payer: Medicare Other | Admitting: Plastic Surgery

## 2023-03-12 ENCOUNTER — Ambulatory Visit: Payer: Medicare Other

## 2023-03-12 ENCOUNTER — Ambulatory Visit: Payer: Medicare Other | Admitting: Hematology and Oncology

## 2023-03-12 DIAGNOSIS — C50412 Malignant neoplasm of upper-outer quadrant of left female breast: Secondary | ICD-10-CM

## 2023-03-16 ENCOUNTER — Ambulatory Visit (HOSPITAL_BASED_OUTPATIENT_CLINIC_OR_DEPARTMENT_OTHER): Payer: Medicare Other | Admitting: Certified Registered Nurse Anesthetist

## 2023-03-16 ENCOUNTER — Encounter (HOSPITAL_BASED_OUTPATIENT_CLINIC_OR_DEPARTMENT_OTHER)
Admission: RE | Disposition: A | Discharge status: Home / Self Care | Payer: Self-pay | Source: Home / Self Care | Attending: Plastic Surgery

## 2023-03-16 ENCOUNTER — Encounter (HOSPITAL_BASED_OUTPATIENT_CLINIC_OR_DEPARTMENT_OTHER): Payer: Self-pay | Admitting: Plastic Surgery

## 2023-03-16 ENCOUNTER — Other Ambulatory Visit: Payer: Self-pay

## 2023-03-16 ENCOUNTER — Ambulatory Visit (HOSPITAL_BASED_OUTPATIENT_CLINIC_OR_DEPARTMENT_OTHER)
Admission: RE | Admit: 2023-03-16 | Discharge: 2023-03-16 | Disposition: A | Discharge status: Home / Self Care | Payer: Medicare Other | Attending: Plastic Surgery | Admitting: Plastic Surgery

## 2023-03-16 ENCOUNTER — Other Ambulatory Visit: Payer: Self-pay | Admitting: Physician Assistant

## 2023-03-16 DIAGNOSIS — I509 Heart failure, unspecified: Secondary | ICD-10-CM

## 2023-03-16 DIAGNOSIS — I110 Hypertensive heart disease with heart failure: Secondary | ICD-10-CM | POA: Insufficient documentation

## 2023-03-16 DIAGNOSIS — C799 Secondary malignant neoplasm of unspecified site: Secondary | ICD-10-CM | POA: Diagnosis not present

## 2023-03-16 DIAGNOSIS — F32A Depression, unspecified: Secondary | ICD-10-CM | POA: Diagnosis not present

## 2023-03-16 DIAGNOSIS — Z79899 Other long term (current) drug therapy: Secondary | ICD-10-CM | POA: Diagnosis not present

## 2023-03-16 DIAGNOSIS — R569 Unspecified convulsions: Secondary | ICD-10-CM | POA: Diagnosis not present

## 2023-03-16 DIAGNOSIS — Z853 Personal history of malignant neoplasm of breast: Secondary | ICD-10-CM

## 2023-03-16 DIAGNOSIS — Z421 Encounter for breast reconstruction following mastectomy: Secondary | ICD-10-CM | POA: Diagnosis not present

## 2023-03-16 DIAGNOSIS — F419 Anxiety disorder, unspecified: Secondary | ICD-10-CM | POA: Diagnosis not present

## 2023-03-16 DIAGNOSIS — Z01818 Encounter for other preprocedural examination: Secondary | ICD-10-CM

## 2023-03-16 DIAGNOSIS — C50912 Malignant neoplasm of unspecified site of left female breast: Secondary | ICD-10-CM | POA: Diagnosis not present

## 2023-03-16 DIAGNOSIS — K219 Gastro-esophageal reflux disease without esophagitis: Secondary | ICD-10-CM | POA: Insufficient documentation

## 2023-03-16 DIAGNOSIS — F418 Other specified anxiety disorders: Secondary | ICD-10-CM | POA: Diagnosis not present

## 2023-03-16 HISTORY — PX: REMOVAL OF TISSUE EXPANDER AND PLACEMENT OF IMPLANT: SHX6457

## 2023-03-16 SURGERY — REMOVAL, TISSUE EXPANDER, BREAST, WITH IMPLANT INSERTION
Anesthesia: General | Site: Breast | Laterality: Left

## 2023-03-16 MED ORDER — CEFAZOLIN SODIUM-DEXTROSE 2-4 GM/100ML-% IV SOLN
INTRAVENOUS | Status: AC
  Filled 2023-03-16: qty 100

## 2023-03-16 MED ORDER — DEXAMETHASONE SODIUM PHOSPHATE 10 MG/ML IJ SOLN
INTRAMUSCULAR | Status: DC | PRN
  Administered 2023-03-16: 4 mg via INTRAVENOUS

## 2023-03-16 MED ORDER — DEXAMETHASONE SODIUM PHOSPHATE 10 MG/ML IJ SOLN
INTRAMUSCULAR | Status: AC
  Filled 2023-03-16: qty 1

## 2023-03-16 MED ORDER — 0.9 % SODIUM CHLORIDE (POUR BTL) OPTIME
TOPICAL | Status: DC | PRN
  Administered 2023-03-16: 1000 mL

## 2023-03-16 MED ORDER — LIDOCAINE 2% (20 MG/ML) 5 ML SYRINGE
INTRAMUSCULAR | Status: AC
  Filled 2023-03-16: qty 5

## 2023-03-16 MED ORDER — ARTIFICIAL TEARS OPHTHALMIC OINT
TOPICAL_OINTMENT | OPHTHALMIC | Status: AC
  Filled 2023-03-16: qty 3.5

## 2023-03-16 MED ORDER — CEFAZOLIN SODIUM-DEXTROSE 2-4 GM/100ML-% IV SOLN
2.0000 g | INTRAVENOUS | Status: AC
  Administered 2023-03-16: 2 g via INTRAVENOUS

## 2023-03-16 MED ORDER — LACTATED RINGERS IV SOLN: INTRAVENOUS | Status: DC

## 2023-03-16 MED ORDER — LIDOCAINE 2% (20 MG/ML) 5 ML SYRINGE
INTRAMUSCULAR | Status: DC | PRN
  Administered 2023-03-16: 60 mg via INTRAVENOUS

## 2023-03-16 MED ORDER — PHENYLEPHRINE 80 MCG/ML (10ML) SYRINGE FOR IV PUSH (FOR BLOOD PRESSURE SUPPORT)
PREFILLED_SYRINGE | INTRAVENOUS | Status: DC | PRN
  Administered 2023-03-16: 80 ug via INTRAVENOUS

## 2023-03-16 MED ORDER — PROPOFOL 10 MG/ML IV BOLUS
INTRAVENOUS | Status: AC
  Filled 2023-03-16: qty 20

## 2023-03-16 MED ORDER — ONDANSETRON HCL 4 MG/2ML IJ SOLN
INTRAMUSCULAR | Status: DC | PRN
  Administered 2023-03-16: 4 mg via INTRAVENOUS

## 2023-03-16 MED ORDER — FENTANYL CITRATE (PF) 250 MCG/5ML IJ SOLN
INTRAMUSCULAR | Status: DC | PRN
  Administered 2023-03-16 (×2): 50 ug via INTRAVENOUS

## 2023-03-16 MED ORDER — PROPOFOL 10 MG/ML IV BOLUS
INTRAVENOUS | Status: DC | PRN
  Administered 2023-03-16: 120 mg via INTRAVENOUS

## 2023-03-16 MED ORDER — EPHEDRINE SULFATE-NACL 50-0.9 MG/10ML-% IV SOSY
PREFILLED_SYRINGE | INTRAVENOUS | Status: DC | PRN
  Administered 2023-03-16: 5 mg via INTRAVENOUS
  Administered 2023-03-16 (×2): 10 mg via INTRAVENOUS

## 2023-03-16 MED ORDER — CHLORHEXIDINE GLUCONATE CLOTH 2 % EX PADS: 6.0000 | MEDICATED_PAD | Freq: Once | CUTANEOUS | Status: DC

## 2023-03-16 MED ORDER — OXYCODONE HCL 5 MG PO TABS: 5.0000 mg | ORAL_TABLET | Freq: Once | ORAL | Status: DC | PRN

## 2023-03-16 MED ORDER — ACETAMINOPHEN 10 MG/ML IV SOLN: 1000.0000 mg | Freq: Once | INTRAVENOUS | Status: DC | PRN

## 2023-03-16 MED ORDER — FENTANYL CITRATE (PF) 100 MCG/2ML IJ SOLN: 25.0000 ug | INTRAMUSCULAR | Status: DC | PRN

## 2023-03-16 MED ORDER — BUPIVACAINE-EPINEPHRINE 0.25% -1:200000 IJ SOLN
INTRAMUSCULAR | Status: DC | PRN
  Administered 2023-03-16: 6 mL

## 2023-03-16 MED ORDER — OXYCODONE HCL 5 MG/5ML PO SOLN: 5.0000 mg | Freq: Once | ORAL | Status: DC | PRN

## 2023-03-16 MED ORDER — FENTANYL CITRATE (PF) 100 MCG/2ML IJ SOLN
INTRAMUSCULAR | Status: AC
  Filled 2023-03-16: qty 2

## 2023-03-16 MED ORDER — ONDANSETRON HCL 4 MG/2ML IJ SOLN
INTRAMUSCULAR | Status: AC
  Filled 2023-03-16: qty 2

## 2023-03-16 MED ORDER — SODIUM CHLORIDE 0.9 % IV SOLN
INTRAVENOUS | Status: DC | PRN
  Administered 2023-03-16: 500 mL

## 2023-03-16 MED ORDER — POVIDONE-IODINE 10 % EX SOLN
CUTANEOUS | Status: DC | PRN
  Administered 2023-03-16: 1 via TOPICAL

## 2023-03-16 SURGICAL SUPPLY — 63 items
ADH SKN CLS APL DERMABOND .7 (GAUZE/BANDAGES/DRESSINGS) ×1
BAG DECANTER FOR FLEXI CONT (MISCELLANEOUS) ×1 IMPLANT
BINDER BREAST LRG (GAUZE/BANDAGES/DRESSINGS) IMPLANT
BINDER BREAST XLRG (GAUZE/BANDAGES/DRESSINGS) IMPLANT
BINDER BREAST XXLRG (GAUZE/BANDAGES/DRESSINGS) IMPLANT
BIOPATCH RED 1 DISK 7.0 (GAUZE/BANDAGES/DRESSINGS) IMPLANT
BLADE SURG 10 STRL SS (BLADE) IMPLANT
BLADE SURG 15 STRL LF DISP TIS (BLADE) ×1 IMPLANT
BLADE SURG 15 STRL SS (BLADE) ×1
CANISTER SUCT 1200ML W/VALVE (MISCELLANEOUS) ×1 IMPLANT
DERMABOND ADVANCED .7 DNX12 (GAUZE/BANDAGES/DRESSINGS) IMPLANT
DRAIN CHANNEL 15F RND FF W/TCR (WOUND CARE) IMPLANT
ELECT BLADE 4.0 EZ CLEAN MEGAD (MISCELLANEOUS) ×1
ELECT COATED BLADE 2.86 ST (ELECTRODE) ×1 IMPLANT
ELECT REM PT RETURN 9FT ADLT (ELECTROSURGICAL) ×1
ELECTRODE BLDE 4.0 EZ CLN MEGD (MISCELLANEOUS) IMPLANT
ELECTRODE REM PT RTRN 9FT ADLT (ELECTROSURGICAL) ×1 IMPLANT
EVACUATOR SILICONE 100CC (DRAIN) IMPLANT
FUNNEL KELLER 2 DISP (MISCELLANEOUS) IMPLANT
GAUZE PAD ABD 8X10 STRL (GAUZE/BANDAGES/DRESSINGS) ×2 IMPLANT
GLOVE BIO SURGEON STRL SZ8 (GLOVE) ×1 IMPLANT
GLOVE BIOGEL M STRL SZ7.5 (GLOVE) ×1 IMPLANT
GLOVE BIOGEL PI IND STRL 7.5 (GLOVE) ×1 IMPLANT
GLOVE BIOGEL PI IND STRL 8 (GLOVE) ×1 IMPLANT
GOWN STRL REUS W/ TWL LRG LVL3 (GOWN DISPOSABLE) ×2 IMPLANT
GOWN STRL REUS W/ TWL XL LVL3 (GOWN DISPOSABLE) ×1 IMPLANT
GOWN STRL REUS W/TWL LRG LVL3 (GOWN DISPOSABLE) ×2
GOWN STRL REUS W/TWL XL LVL3 (GOWN DISPOSABLE) ×2
HIBICLENS CHG 4% 4OZ BTL (MISCELLANEOUS) ×1 IMPLANT
IMPL BREAST HP GEL 400CC (Breast) IMPLANT
IMPLANT BREAST HP GEL 400CC (Breast) ×1 IMPLANT
IV NS 500ML (IV SOLUTION)
IV NS 500ML BAXH (IV SOLUTION) IMPLANT
KIT FILL ASEPTIC TRANSFER (MISCELLANEOUS) IMPLANT
MARKER SKIN DUAL TIP RULER LAB (MISCELLANEOUS) IMPLANT
NDL HYPO 25X1 1.5 SAFETY (NEEDLE) ×1 IMPLANT
NEEDLE HYPO 25X1 1.5 SAFETY (NEEDLE) ×1 IMPLANT
PACK BASIN DAY SURGERY FS (CUSTOM PROCEDURE TRAY) ×1 IMPLANT
PACK UNIVERSAL I (CUSTOM PROCEDURE TRAY) ×1 IMPLANT
PENCIL SMOKE EVACUATOR (MISCELLANEOUS) ×1 IMPLANT
PIN SAFETY STERILE (MISCELLANEOUS) IMPLANT
SIZER BREAST REUSE 375CC (SIZER) ×1
SIZER BREAST REUSE 400CC (SIZER) ×1
SIZER BRST REUSE 12.2 400CC (SIZER) IMPLANT
SIZER BRST REUSE P4.8 12 375CC (SIZER) IMPLANT
SLEEVE SCD COMPRESS KNEE MED (STOCKING) ×1 IMPLANT
SPIKE FLUID TRANSFER (MISCELLANEOUS) IMPLANT
SPONGE T-LAP 18X18 ~~LOC~~+RFID (SPONGE) ×1 IMPLANT
STAPLER INSORB 30 2030 C-SECTI (MISCELLANEOUS) IMPLANT
STAPLER VISISTAT 35W (STAPLE) ×1 IMPLANT
STRIP SUTURE WOUND CLOSURE 1/2 (MISCELLANEOUS) ×2 IMPLANT
SUT MNCRL AB 3-0 PS2 27 (SUTURE) ×2 IMPLANT
SUT MNCRL AB 4-0 PS2 18 (SUTURE) ×2 IMPLANT
SUT SILK 2 0 SH (SUTURE) IMPLANT
SUT VIC AB 3-0 SH 27 (SUTURE) ×4
SUT VIC AB 3-0 SH 27X BRD (SUTURE) ×2 IMPLANT
SYR BULB IRRIG 60ML STRL (SYRINGE) ×1 IMPLANT
SYR CONTROL 10ML LL (SYRINGE) ×1 IMPLANT
TOWEL GREEN STERILE FF (TOWEL DISPOSABLE) ×2 IMPLANT
TRAY DSU PREP LF (CUSTOM PROCEDURE TRAY) IMPLANT
TUBE CONNECTING 20X1/4 (TUBING) ×1 IMPLANT
UNDERPAD 30X36 HEAVY ABSORB (UNDERPADS AND DIAPERS) ×2 IMPLANT
YANKAUER SUCT BULB TIP NO VENT (SUCTIONS) ×1 IMPLANT

## 2023-03-16 NOTE — Anesthesia Procedure Notes (Signed)
Procedure Name: LMA Insertion Date/Time: 03/16/2023 9:07 AM  Performed by: Demetrio Lapping, CRNAPre-anesthesia Checklist: Patient identified, Emergency Drugs available, Suction available and Patient being monitored Patient Re-evaluated:Patient Re-evaluated prior to induction Oxygen Delivery Method: Circle System Utilized Preoxygenation: Pre-oxygenation with 100% oxygen Induction Type: IV induction Ventilation: Mask ventilation without difficulty LMA: LMA inserted LMA Size: 4.0 Number of attempts: 1 Airway Equipment and Method: Bite block Placement Confirmation: positive ETCO2 Tube secured with: Tape Dental Injury: Teeth and Oropharynx as per pre-operative assessment

## 2023-03-16 NOTE — Discharge Instructions (Addendum)
Activity: Avoid strenuous activity.  No lifting, pushing, or pulling greater than 15 pounds.  Diet: No restrictions.  Try to optimize nutrition with plenty of proteins, fruits, and vegetables to improve healing.   Wound Care: After implant was placed, the incision was closed with sutures and skin glue.  ABD pad placed for comfort and any incisional drainage.  Leave breast binder on for the first week and then you may transition to a front-clipping or front-zipping compression bra.  Sponge bathe x 2 days and then shower normally.  Follow-Up: As scheduled.  Things to watch for:  Call the office if you experience fever, chills, intractable vomiting, severe discomfort/swelling, or significant bleeding/drainage.  Post Anesthesia Home Care Instructions  Activity: Get plenty of rest for the remainder of the day. A responsible individual must stay with you for 24 hours following the procedure.  For the next 24 hours, DO NOT: -Drive a car -Advertising copywriter -Drink alcoholic beverages -Take any medication unless instructed by your physician -Make any legal decisions or sign important papers.  Meals: Start with liquid foods such as gelatin or soup. Progress to regular foods as tolerated. Avoid greasy, spicy, heavy foods. If nausea and/or vomiting occur, drink only clear liquids until the nausea and/or vomiting subsides. Call your physician if vomiting continues.  Special Instructions/Symptoms: Your throat may feel dry or sore from the anesthesia or the breathing tube placed in your throat during surgery. If this causes discomfort, gargle with warm salt water. The discomfort should disappear within 24 hours.

## 2023-03-16 NOTE — Interval H&P Note (Signed)
History and Physical Interval Note: Pt met in the pre op area, no change in physical exam or indication for surgery. Surgical site marked and procedure discussed with Allison Pierce. All questions answered to her satisfaction. Will proceed with te to implant exchange at her request  03/16/2023 8:45 AM  Allison Pierce  has presented today for surgery, with the diagnosis of History of breast reconstruction.  The various methods of treatment have been discussed with the patient and family. After consideration of risks, benefits and other options for treatment, the patient has consented to  Procedure(s): Removal of left breast tissue expander and placement of permanent implant (Left) as a surgical intervention.  The patient's history has been reviewed, patient examined, no change in status, stable for surgery.  I have reviewed the patient's chart and labs.  Questions were answered to the patient's satisfaction.     Allison Pierce

## 2023-03-16 NOTE — Anesthesia Preprocedure Evaluation (Signed)
Anesthesia Evaluation  Patient identified by MRN, date of birth, ID band Patient awake    Reviewed: Allergy & Precautions, NPO status , Patient's Chart, lab work & pertinent test results  Airway Mallampati: II  TM Distance: >3 FB Neck ROM: Full    Dental  (+) Partial Upper, Partial Lower   Pulmonary neg pulmonary ROS   Pulmonary exam normal        Cardiovascular hypertension, +CHF   Rhythm:Regular Rate:Normal     Neuro/Psych Seizures -, Well Controlled,   Anxiety Depression       GI/Hepatic Neg liver ROS,GERD  ,,  Endo/Other  negative endocrine ROS    Renal/GU negative Renal ROS  negative genitourinary   Musculoskeletal negative musculoskeletal ROS (+)    Abdominal Normal abdominal exam  (+)   Peds  Hematology negative hematology ROS (+)   Anesthesia Other Findings   Reproductive/Obstetrics                             Anesthesia Physical Anesthesia Plan  ASA: 3  Anesthesia Plan: General   Post-op Pain Management:    Induction: Intravenous  PONV Risk Score and Plan: 3 and Ondansetron, Dexamethasone and Treatment may vary due to age or medical condition  Airway Management Planned: Mask and LMA  Additional Equipment: None  Intra-op Plan:   Post-operative Plan: Extubation in OR  Informed Consent: I have reviewed the patients History and Physical, chart, labs and discussed the procedure including the risks, benefits and alternatives for the proposed anesthesia with the patient or authorized representative who has indicated his/her understanding and acceptance.     Dental advisory given  Plan Discussed with: CRNA  Anesthesia Plan Comments:        Anesthesia Quick Evaluation

## 2023-03-16 NOTE — Op Note (Signed)
DATE OF OPERATION: 03/16/2023  LOCATION: [facility] operating Room  PREOPERATIVE DIAGNOSIS: Left breast cancer, status post for stage implant-based reconstruction  POSTOPERATIVE DIAGNOSIS: Same  PROCEDURE: Removal of left breast tissue expander and placement of permanent silicone implant  SURGEON: Loren Racer, MD  ASSISTANT: Evelena Leyden  EBL: 10 cc  CONDITION: Stable  COMPLICATIONS: None  INDICATION: The patient, Allison Pierce, is a 77 y.o. female born on [birth year], is here for second stage of her implant-based left breast reconstruction.  The patient deviously had a tissue expander placed and she is now scheduled for removal of the tissue expander and placement of her permanent silicone implant.   PROCEDURE DETAILS:  The patient was seen prior to surgery and marked.   IV antibiotics were given. The patient was taken to the operating room and given a general anesthetic. A standard time out was performed and all information was confirmed by those in the room. SCDs were placed.   The chest was prepped and draped in usual sterile manner.  The previous scar was incised and dissection carried out down to the level of the acellular dermal matrix.  The skin was then elevated on the surface of the ADM for approximately 2 cm.  The ADM was incised and the tissue expander removed without difficulty.  A minimal capsulorrhaphy was performed to release a scar contracture on the medial aspect of the breast.  Once this was completed the pocket was irrigated with a dilute Betadine solution and sizers were placed to identify the most aesthetically acceptable implant.  I elected to place a 400 cc Mentor gel high profile implant.  The skin was reprepped with Betadine.  The entire surgical team changed gloves.  Using fresh instruments a 400 cc high-profile Mentor gel implant was placed.  The ADM was closed with interrupted 3-0 Vicryl sutures.  The dermis was closed with interrupted 3-0 Monocryl sutures.   The skin was closed with a running 4-0 Monocryl subcuticular stitch.  The incision was sealed with Dermabond.  The patient was placed in a supportive garment and awakened from anesthesia without incident.  She was transferred to the recovery room in good condition.  All instrument needle and sponge counts were reported as correct. The patient was allowed to wake up and taken to recovery room in stable condition at the end of the case. The family was notified at the end of the case.   The advanced practice practitioner (APP) assisted throughout the case.  The APP was essential in retraction and counter traction when needed to make the case progress smoothly.  This retraction and assistance made it possible to see the tissue plans for the procedure.  The assistance was needed for blood control, tissue re-approximation and assisted with closure of the incision site.

## 2023-03-16 NOTE — Transfer of Care (Signed)
Immediate Anesthesia Transfer of Care Note  Patient: Allison Pierce  Procedure(s) Performed: Removal of left breast tissue expander and placement of permanent implant (Left: Breast)  Patient Location: PACU  Anesthesia Type:General  Level of Consciousness: awake and patient cooperative  Airway & Oxygen Therapy: Patient Spontanous Breathing and Patient connected to face mask oxygen  Post-op Assessment: Report given to RN and Post -op Vital signs reviewed and stable  Post vital signs: Reviewed and stable  Last Vitals:  Vitals Value Taken Time  BP 154/71   Temp    Pulse 84 03/16/23 1020  Resp    SpO2 100 % 03/16/23 1020  Vitals shown include unvalidated device data.  Last Pain:  Vitals:   03/16/23 0706  TempSrc: Temporal  PainSc: 0-No pain      Patients Stated Pain Goal: 3 (03/16/23 0706)  Complications: No notable events documented.

## 2023-03-16 NOTE — Anesthesia Postprocedure Evaluation (Signed)
Anesthesia Post Note  Patient: Lakechia Rogg  Procedure(s) Performed: Removal of left breast tissue expander and placement of permanent implant (Left: Breast)     Patient location during evaluation: PACU Anesthesia Type: General Level of consciousness: awake and alert Pain management: pain level controlled Vital Signs Assessment: post-procedure vital signs reviewed and stable Respiratory status: spontaneous breathing, nonlabored ventilation, respiratory function stable and patient connected to nasal cannula oxygen Cardiovascular status: blood pressure returned to baseline and stable Postop Assessment: no apparent nausea or vomiting Anesthetic complications: no   No notable events documented.  Last Vitals:  Vitals:   03/16/23 1100 03/16/23 1115  BP: (!) 152/69 (!) 168/72  Pulse: 75 76  Resp: 14 16  Temp:  36.6 C  SpO2: 95% 98%    Last Pain:  Vitals:   03/16/23 1115  TempSrc:   PainSc: 0-No pain                 Earl Lites P Eulanda Dorion

## 2023-03-17 ENCOUNTER — Other Ambulatory Visit (HOSPITAL_COMMUNITY): Payer: Self-pay

## 2023-03-17 ENCOUNTER — Other Ambulatory Visit: Payer: Self-pay | Admitting: Hematology and Oncology

## 2023-03-19 ENCOUNTER — Encounter (HOSPITAL_BASED_OUTPATIENT_CLINIC_OR_DEPARTMENT_OTHER): Payer: Self-pay | Admitting: Plastic Surgery

## 2023-03-19 ENCOUNTER — Encounter: Payer: Self-pay | Admitting: Hematology and Oncology

## 2023-03-19 ENCOUNTER — Other Ambulatory Visit: Payer: Self-pay | Admitting: *Deleted

## 2023-03-19 ENCOUNTER — Other Ambulatory Visit (HOSPITAL_COMMUNITY): Payer: Self-pay

## 2023-03-19 MED ORDER — LIDOCAINE VISCOUS HCL 2 % MT SOLN
5.0000 mL | Freq: Three times a day (TID) | OROMUCOSAL | 1 refills | Status: DC | PRN
  Filled 2023-03-19: qty 360, 14d supply, fill #0

## 2023-03-19 MED ORDER — LIDOCAINE VISCOUS HCL 2 % MT SOLN
5.0000 mL | Freq: Four times a day (QID) | OROMUCOSAL | 1 refills | Status: DC | PRN
  Filled 2023-03-19: qty 240, 12d supply, fill #0

## 2023-03-19 NOTE — Progress Notes (Signed)
Received message from [facility] pharmacy team stating the after hours provider called in [facility] Magic Mouthwash for pt and pt is requesting script for Magic Mouthwash with Lidocaine.  RN reveiwed with MD and verbal orders received for Magic Mouthwash with 1 part benadry, 1 part maalox, and 1 part lidocaine in a 240 mL bottle with instructions to swish and spit 4 times a day PRN.  Prescription sent to pharmacy on file.

## 2023-03-21 ENCOUNTER — Ambulatory Visit (INDEPENDENT_AMBULATORY_CARE_PROVIDER_SITE_OTHER): Payer: Medicare Other | Admitting: Plastic Surgery

## 2023-03-21 DIAGNOSIS — Z9889 Other specified postprocedural states: Secondary | ICD-10-CM

## 2023-03-21 NOTE — Progress Notes (Signed)
[name] returns today for evaluation approximately 1 week after tissue expander to implant exchange.  She is doing well with no complaints.  She is happy with the  size and appearance of her breast.  She denies any significant pain  .  On evaluation she has a very nice result with marked improvement of the contour defect on the medial aspect of the breast.  The incisions are clean dry and intact.  There is no erythema or drainage.  She is encouraged to slowly begin increasing her activity.  She will begin scar massage next week.  Keep follow-up appointments.  Call for any concerns.

## 2023-03-22 ENCOUNTER — Encounter: Payer: Medicare Other | Admitting: Surgical

## 2023-03-26 ENCOUNTER — Telehealth: Payer: Self-pay | Admitting: Plastic Surgery

## 2023-03-26 NOTE — Telephone Encounter (Signed)
I spoke with [name]. She tells me she had a sudden episode on vomiting and diarrhea on Friday. States "It went down my windpipe". She reports "feeling bad" all weekend and this has not improved. She has a scheduled appointment with Dr. Clelia Croft tomorrow. I advised her to contact her PCP office now for directions and to see if they wished to see her today. She voiced understanding and stated she would do this.

## 2023-03-26 NOTE — Telephone Encounter (Signed)
Pt called and stated she is feeling sick and wants to know if she can take any meds

## 2023-03-27 ENCOUNTER — Other Ambulatory Visit: Payer: Self-pay | Admitting: Family Medicine

## 2023-03-27 DIAGNOSIS — Z1231 Encounter for screening mammogram for malignant neoplasm of breast: Secondary | ICD-10-CM

## 2023-03-27 DIAGNOSIS — R7301 Impaired fasting glucose: Secondary | ICD-10-CM | POA: Diagnosis not present

## 2023-03-27 DIAGNOSIS — I10 Essential (primary) hypertension: Secondary | ICD-10-CM | POA: Diagnosis not present

## 2023-03-27 DIAGNOSIS — E782 Mixed hyperlipidemia: Secondary | ICD-10-CM | POA: Diagnosis not present

## 2023-03-27 NOTE — Telephone Encounter (Signed)
Thank you :)

## 2023-04-01 NOTE — Progress Notes (Signed)
Patient Care Team: Lupita Raider, MD as PCP - General (Family Medicine) Van Clines, MD as Consulting Physician (Neurology) Pershing Proud, RN as Oncology Nurse Navigator Donnelly Angelica, RN as Oncology Nurse Navigator Abigail Miyamoto, MD as Consulting Physician (General Surgery) Serena Croissant, MD as Consulting Physician (Hematology and Oncology) Lonie Peak, MD as Attending Physician (Radiation Oncology)  DIAGNOSIS:  Encounter Diagnosis  Name Primary?   Malignant neoplasm of upper-outer quadrant of left breast in female, estrogen receptor negative (HCC) Yes    SUMMARY OF ONCOLOGIC HISTORY: Oncology History  Malignant neoplasm of upper-outer quadrant of left breast in female, estrogen receptor negative (HCC)  02/10/2022 Initial Diagnosis   Palpable left breast masses and calcifications: 1.8 cm at 1:00 and 1.7 cm at 9:00, calcifications not biopsy.  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 Report date is 03/05/2022.  The CustomNext-Cancer+RNAinsight panel offered by [facility] includes sequencing and rearrangement analysis for the following 48 genes:  APC, ATM, AXIN2, BARD1, BMPR1A, BRCA1, BRCA2, BRIP1, CDH1, CDK4, CDKN2A, CHEK2, CTNNA1, DICER1, EGFR, EPCAM, GREM1, HOXB13, KIT, MEN1, MLH1, MSH2, MSH3, MSH6, MUTYH, NBN, NF1, NTHL1, PALB2, PDGFRA, PMS2, POLD1, POLE, PTEN, RAD50, RAD51C, RAD51D, SDHA, SDHB, SDHC, SDHD, SMAD4, SMARCA4, STK11, TP53, TSC1, TSC2, and VHL.  RNA data is routinely analyzed for use in variant interpretation for all genes.   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, LCIS, 5/5 lymph nodes positive with extranodal extension, margins negative ER 0%, PR 0%, HER2 3+, Ki-67 20%   04/27/2022 Cancer Staging   Staging form: Breast, AJCC 8th Edition - Pathologic: Stage IIIA (pT2, pN2, cM0, G2, ER-, PR-, HER2+) - Signed by Serena Croissant, MD on 04/27/2022 Histologic grading system: 3 grade system   05/05/2022 - 05/26/2022 Chemotherapy   Patient is on Treatment Plan : BREAST DOCEtaxel + Trastuzumab + Pertuzumab (THP) q21d x 8 cycles / Trastuzumab + Pertuzumab q21d x 4 cycles     05/26/2022 -  Chemotherapy   Patient is on Treatment Plan : BREAST MAINTENANCE Trastuzumab IV (6) or SQ (600) D1 q21d X 11 Cycles       CHIEF COMPLIANT:  Follow up herceptin and Perjeta     INTERVAL HISTORY: Allison Pierce is a 77 y.o with the above-mentioned metastatic breast cancer currently on herceptin perjeta. She presents to the clinic for a follow-up. Pt reports that she is doing good. She says the lack of energy is about the same. Appetite has been good. She denies any pain. She does have sores in the mouth and complains of legs tingling an numbness.   ALLERGIES:  is allergic to oxybutynin, prozac [fluoxetine hcl], ritalin [methylphenidate], rosuvastatin, and lyrica [pregabalin].  MEDICATIONS:  Current Outpatient Medications  Medication Sig Dispense Refill   acyclovir (ZOVIRAX) 400 MG tablet Take 1 tablet (400 mg total) by mouth daily for 7 days. 7 tablet 0   B Complex-C (B COMPLEX-VITAMIN C) CAPS Take 1 capsule by mouth daily.     buPROPion (WELLBUTRIN XL) 300 MG 24 hr tablet Take 300 mg by mouth every morning.  cholecalciferol (VITAMIN D3) 25 MCG (1000 UNIT) tablet Take 1,000 Units by mouth daily.     clonazePAM (KLONOPIN) 1 MG tablet Take 1 mg by mouth at bedtime.     Cyanocobalamin (VITAMIN B-12) 5000 MCG TBDP Take 5,000 Units by mouth daily.     diphenhydrAMINE (BENADRYL) 25 MG tablet Take 25 mg by  mouth in the morning.     fluticasone (FLONASE) 50 MCG/ACT nasal spray Place 1 spray into both nostrils daily as needed for allergies.     lidocaine-prilocaine (EMLA) cream Apply 1 Application topically as needed. Apply one application daily as needed to port a cath site. 30 g 3   losartan (COZAAR) 100 MG tablet Take 1 tablet (100 mg total) by mouth daily. 90 tablet 3   magic mouthwash (lidocaine, diphenhydrAMINE, alum & mag hydroxide) suspension Swish and swallow 5 mLs by mouth 3 times daily as needed for mouth pain. 360 mL 1   magic mouthwash (lidocaine, diphenhydrAMINE, alum & mag hydroxide) suspension Swish and spit 5 mLs by mouth 4 (four) times daily as needed for mouth pain. 240 mL 1   meloxicam (MOBIC) 15 MG tablet Take 15 mg by mouth daily.     methylphenidate (RITALIN) 5 MG tablet Take 5 mg by mouth 2 (two) times daily as needed.     Metoprolol Succinate (TOPROL XL PO) Take by mouth. Per 02/15/23 note by Dr. Clarise Cruz, cardiologist; pt does not know dosage and this is not listed in med list     Potassium 99 MG TABS Take 99 mg by mouth at bedtime.     promethazine-dextromethorphan (PROMETHAZINE-DM) 6.25-15 MG/5ML syrup Take 2.5 mLs by mouth 4 (four) times daily as needed for cough. 118 mL 0   traZODone (DESYREL) 150 MG tablet Take 225 mg by mouth at bedtime.     venlafaxine XR (EFFEXOR-XR) 75 MG 24 hr capsule Take 75 mg by mouth daily with breakfast.     No current facility-administered medications for this visit.    PHYSICAL EXAMINATION: ECOG PERFORMANCE STATUS: 1 - Symptomatic but completely ambulatory  Vitals:   04/09/23 1053  BP: 137/78  Pulse: 81  Resp: 18  Temp: (!) 97.5 F (36.4 C)  SpO2: 97%   Filed Weights   04/09/23 1053  Weight: 146 lb (66.2 kg)     LABORATORY DATA:  I have reviewed the data as listed    Latest Ref Rng & Units 12/14/2022   10:59 AM 11/27/2022    1:19 PM 10/11/2022    2:12 PM  CMP  Glucose 70 - 99 mg/dL 161  97  096   BUN 8 - 23 mg/dL 12  18   9    Creatinine 0.44 - 1.00 mg/dL 0.45  4.09  8.11   Sodium 135 - 145 mmol/L 135  131  138   Potassium 3.5 - 5.1 mmol/L 4.3  4.3  3.7   Chloride 98 - 111 mmol/L 103  98  106   CO2 22 - 32 mmol/L 27  28  26    Calcium 8.9 - 10.3 mg/dL 8.6  8.5  8.5   Total Protein 6.5 - 8.1 g/dL 5.8  6.2  6.0   Total Bilirubin 0.3 - 1.2 mg/dL 0.4  0.4  0.5   Alkaline Phos 38 - 126 U/L 79  92  96   AST 15 - 41 U/L 18  17  25    ALT 0 - 44 U/L 15  17  35     Lab Results  Component Value Date   WBC 7.7 12/14/2022   HGB 10.6 (L) 12/14/2022   HCT 31.5 (L) 12/14/2022   MCV 86.8 12/14/2022   PLT 305 12/14/2022   NEUTROABS 5.3 12/14/2022    ASSESSMENT & PLAN:  Malignant neoplasm of upper-outer quadrant of left breast in female, estrogen receptor negative (HCC) 04/07/2022:Left mastectomy: 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  T4 vertebral body Bone scan 04/25/2022: Abnormal uptake in the sternum, right parietal region of calvarium, right posterior seventh rib, T5, left sternoclavicular joint   Treatment: Palliative chemotherapy with Taxotere [name] and Perjeta every 3 weeks x1 cycle given 05/05/2022 (discontinued for severe toxicities and hospitalization)   ---------------------------------------------------------------------------------------------------------------------------------------------- Chemotoxicities: Hospitalization 05/11/2022-05/18/2022: Convulsive syncope, chemo induced neutropenia, oral mucositis, chronic CHF, severe diarrhea with dehydration ECHO 06/26/22: EF 55-60% (follows with [facility])   Treatment plan:05/26/22 [name]. added Perjeta 06/21/22.   07/30/2022: Bone scan: Interval resolution/decreasing accumulation of multiple bone  metastases 10/30/2022: CT CAP: Mildly prominent mediastinal lymph nodes are similar, no pulmonary metastasis, skeletal lesions increasing sclerosis, no visceral metastases 01/16/2023: CT CAP: Stable prominent mediastinal lymph nodes, slight increase in the small nodes in the mesentery could be reactive, scattered bone metastases stable  Prior to her treatment in August we will obtain scans. Will also obtain labs today. Do not wait for the lab results to proceed with her treatment. She can cancel the mammogram appointment.   Orders Placed This Encounter  Procedures   CT CHEST ABDOMEN PELVIS W CONTRAST    Standing Status:   Future    Standing Expiration Date:   04/08/2024    Order Specific Question:   If indicated for the ordered procedure, I authorize the administration of contrast media per Radiology protocol    Answer:   Yes    Order Specific Question:   Does the patient have a contrast media/X-ray dye allergy?    Answer:   No    Order Specific Question:   Preferred imaging location?    Answer:   [facility]    Order Specific Question:   If indicated for the ordered procedure, I authorize the administration of oral contrast media per Radiology protocol    Answer:   Yes   CBC with Differential (Cancer Center Only)    Standing Status:   Future    Standing Expiration Date:   04/08/2024   CMP (Cancer Center only)    Standing Status:   Future    Standing Expiration Date:   04/08/2024   The patient has a good understanding of the overall plan. she agrees with it. she will call with any problems that may develop before the next visit here. Total time spent: 30 mins including face to face time and time spent for planning, charting and co-ordination of care   Tamsen Meek, MD 04/09/23    I Janan Ridge am acting as a Neurosurgeon for [employer]  I have reviewed the above documentation for accuracy and completeness, and I agree with the above.

## 2023-04-03 ENCOUNTER — Ambulatory Visit: Payer: Medicare Other

## 2023-04-04 ENCOUNTER — Encounter: Payer: Self-pay | Admitting: Surgical

## 2023-04-04 ENCOUNTER — Ambulatory Visit (INDEPENDENT_AMBULATORY_CARE_PROVIDER_SITE_OTHER): Payer: Medicare Other | Admitting: Surgical

## 2023-04-04 ENCOUNTER — Encounter: Payer: Medicare Other | Admitting: Surgical

## 2023-04-04 VITALS — BP 155/75 | HR 91 | Ht 66.0 in | Wt 144.2 lb

## 2023-04-04 DIAGNOSIS — C50412 Malignant neoplasm of upper-outer quadrant of left female breast: Secondary | ICD-10-CM

## 2023-04-04 DIAGNOSIS — Z9889 Other specified postprocedural states: Secondary | ICD-10-CM

## 2023-04-04 NOTE — Progress Notes (Signed)
Patient is a 77 year old female here for follow-up after removal of left breast tissue expander and placement of left breast implant with Dr. Ladona Ridgel on 03/16/2023.  A 400 cc high-profile Mentor gel implant was placed. She is here today with her husband, she reports overall she is doing well after surgery, she does feel as if she has asymmetry and reports the right breast is larger than the left.  She reports that she has had episodes of passing out prior to surgery, she reports that this has been ongoing for quite some time.  But she does report that this past weekend she passed out while sitting on the toilet.  She denies any cardiac or pulmonary symptoms, denies any dizziness prior to the episodes of passing out.  She reports she has been drinking plenty of water and even using IV hydration packets in her water.  She reports that she did not hit her head, she is not having any active symptoms at this time.  She reports that she does not notice any precipitating events.  On exam left breast incision is intact and healing well, there is no erythema or cellulitic changes.  No subcutaneous fluid collection noted.  She does have asymmetry with the left breast being smaller than the right.  A/P:  Encouraged patient that she needs to call her primary care provider today to notify them of her symptoms, she has had this ongoing for many months and reports that she has had cardiac evaluation, most recently had an echo on 02/12/2023 with EF 60 to 65%.  Her blood pressure is not low today.  Patient and her husband were agreeable to reach out to PCP today.  Recommend following up for reevaluation of breast reconstruction in 3 weeks, discussed ongoing restrictions and compressive garments.

## 2023-04-06 ENCOUNTER — Encounter (INDEPENDENT_AMBULATORY_CARE_PROVIDER_SITE_OTHER): Payer: Self-pay

## 2023-04-09 ENCOUNTER — Other Ambulatory Visit: Payer: Self-pay

## 2023-04-09 ENCOUNTER — Other Ambulatory Visit (HOSPITAL_COMMUNITY): Payer: Self-pay

## 2023-04-09 ENCOUNTER — Inpatient Hospital Stay: Payer: Medicare Other

## 2023-04-09 ENCOUNTER — Inpatient Hospital Stay: Payer: Medicare Other | Attending: Hematology and Oncology | Admitting: Hematology and Oncology

## 2023-04-09 VITALS — BP 137/78 | HR 81 | Temp 97.5°F | Resp 18 | Ht 66.0 in | Wt 146.0 lb

## 2023-04-09 VITALS — BP 172/82 | HR 65 | Resp 17

## 2023-04-09 DIAGNOSIS — Z95828 Presence of other vascular implants and grafts: Secondary | ICD-10-CM

## 2023-04-09 DIAGNOSIS — C7951 Secondary malignant neoplasm of bone: Secondary | ICD-10-CM | POA: Insufficient documentation

## 2023-04-09 DIAGNOSIS — Z9012 Acquired absence of left breast and nipple: Secondary | ICD-10-CM | POA: Diagnosis not present

## 2023-04-09 DIAGNOSIS — Z171 Estrogen receptor negative status [ER-]: Secondary | ICD-10-CM | POA: Insufficient documentation

## 2023-04-09 DIAGNOSIS — Z803 Family history of malignant neoplasm of breast: Secondary | ICD-10-CM | POA: Diagnosis not present

## 2023-04-09 DIAGNOSIS — Z801 Family history of malignant neoplasm of trachea, bronchus and lung: Secondary | ICD-10-CM | POA: Diagnosis not present

## 2023-04-09 DIAGNOSIS — Z5112 Encounter for antineoplastic immunotherapy: Secondary | ICD-10-CM | POA: Insufficient documentation

## 2023-04-09 DIAGNOSIS — C50412 Malignant neoplasm of upper-outer quadrant of left female breast: Secondary | ICD-10-CM | POA: Insufficient documentation

## 2023-04-09 LAB — CBC WITH DIFFERENTIAL (CANCER CENTER ONLY)
Abs Immature Granulocytes: 0.01 10*3/uL (ref 0.00–0.07)
Basophils Absolute: 0.1 10*3/uL (ref 0.0–0.1)
Basophils Relative: 2 %
Eosinophils Absolute: 0.2 10*3/uL (ref 0.0–0.5)
Eosinophils Relative: 3 %
HCT: 33.3 % — ABNORMAL LOW (ref 36.0–46.0)
Hemoglobin: 10.7 g/dL — ABNORMAL LOW (ref 12.0–15.0)
Immature Granulocytes: 0 %
Lymphocytes Relative: 17 %
Lymphs Abs: 1 10*3/uL (ref 0.7–4.0)
MCH: 27 pg (ref 26.0–34.0)
MCHC: 32.1 g/dL (ref 30.0–36.0)
MCV: 83.9 fL (ref 80.0–100.0)
Monocytes Absolute: 0.5 10*3/uL (ref 0.1–1.0)
Monocytes Relative: 8 %
Neutro Abs: 4.1 10*3/uL (ref 1.7–7.7)
Neutrophils Relative %: 70 %
Platelet Count: 279 10*3/uL (ref 150–400)
RBC: 3.97 MIL/uL (ref 3.87–5.11)
RDW: 13.2 % (ref 11.5–15.5)
WBC Count: 5.9 10*3/uL (ref 4.0–10.5)
nRBC: 0 % (ref 0.0–0.2)

## 2023-04-09 LAB — CMP (CANCER CENTER ONLY)
ALT: 17 U/L (ref 0–44)
AST: 18 U/L (ref 15–41)
Albumin: 3.6 g/dL (ref 3.5–5.0)
Alkaline Phosphatase: 88 U/L (ref 38–126)
Anion gap: 5 (ref 5–15)
BUN: 13 mg/dL (ref 8–23)
CO2: 27 mmol/L (ref 22–32)
Calcium: 8.9 mg/dL (ref 8.9–10.3)
Chloride: 102 mmol/L (ref 98–111)
Creatinine: 1.01 mg/dL — ABNORMAL HIGH (ref 0.44–1.00)
GFR, Estimated: 57 mL/min — ABNORMAL LOW (ref >60)
Glucose, Bld: 98 mg/dL (ref 70–99)
Potassium: 4.1 mmol/L (ref 3.5–5.1)
Sodium: 134 mmol/L — ABNORMAL LOW (ref 135–145)
Total Bilirubin: 0.5 mg/dL (ref 0.3–1.2)
Total Protein: 6 g/dL — ABNORMAL LOW (ref 6.5–8.1)

## 2023-04-09 MED ORDER — ACETAMINOPHEN 325 MG PO TABS
650.0000 mg | ORAL_TABLET | Freq: Once | ORAL | Status: AC
  Administered 2023-04-09: 650 mg via ORAL
  Filled 2023-04-09: qty 2

## 2023-04-09 MED ORDER — SODIUM CHLORIDE 0.9 % IV SOLN
420.0000 mg | Freq: Once | INTRAVENOUS | Status: AC
  Administered 2023-04-09: 420 mg via INTRAVENOUS
  Filled 2023-04-09: qty 14

## 2023-04-09 MED ORDER — DIPHENHYDRAMINE HCL 25 MG PO CAPS
25.0000 mg | ORAL_CAPSULE | Freq: Once | ORAL | Status: AC
  Administered 2023-04-09: 25 mg via ORAL
  Filled 2023-04-09: qty 1

## 2023-04-09 MED ORDER — ACYCLOVIR 400 MG PO TABS
400.0000 mg | ORAL_TABLET | Freq: Every day | ORAL | 0 refills | Status: AC
  Filled 2023-04-09: qty 7, 7d supply, fill #0

## 2023-04-09 MED ORDER — SODIUM CHLORIDE 0.9% FLUSH
10.0000 mL | INTRAVENOUS | Status: DC | PRN
  Administered 2023-04-09: 10 mL

## 2023-04-09 MED ORDER — SODIUM CHLORIDE 0.9 % IV SOLN: Freq: Once | INTRAVENOUS | Status: AC

## 2023-04-09 MED ORDER — TRASTUZUMAB-DKST CHEMO 150 MG IV SOLR
6.0000 mg/kg | Freq: Once | INTRAVENOUS | Status: AC
  Administered 2023-04-09: 420 mg via INTRAVENOUS
  Filled 2023-04-09: qty 20

## 2023-04-09 MED ORDER — SODIUM CHLORIDE 0.9% FLUSH
10.0000 mL | Freq: Once | INTRAVENOUS | Status: AC
  Administered 2023-04-09: 10 mL

## 2023-04-09 MED ORDER — HEPARIN SOD (PORK) LOCK FLUSH 100 UNIT/ML IV SOLN
500.0000 [IU] | Freq: Once | INTRAVENOUS | Status: AC | PRN
  Administered 2023-04-09: 500 [IU]

## 2023-04-09 NOTE — Assessment & Plan Note (Signed)
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  T4 vertebral body Bone scan 04/25/2022: Abnormal uptake in the sternum, right parietal region of calvarium, right posterior seventh rib, T5, left sternoclavicular joint   Treatment: Palliative chemotherapy with Taxotere [name] and Perjeta every 3 weeks x1 cycle given 05/05/2022 (discontinued for severe toxicities and hospitalization)   ---------------------------------------------------------------------------------------------------------------------------------------------- Chemotoxicities: Hospitalization 05/11/2022-05/18/2022: Convulsive syncope, chemo induced neutropenia, oral mucositis, chronic CHF, severe diarrhea with dehydration ECHO 06/26/22: EF 55-60% (follows with [facility])   Treatment plan:05/26/22 [name]. added Perjeta 06/21/22.   07/30/2022: Bone scan: Interval resolution/decreasing accumulation of multiple bone metastases 10/30/2022: CT CAP: Mildly prominent mediastinal lymph nodes are similar, no pulmonary metastasis, skeletal lesions increasing sclerosis, no visceral metastases 01/16/2023: CT CAP: Stable prominent mediastinal lymph nodes, slight increase in the small nodes in the mesentery could be reactive, scattered bone metastases stable  Prior to the next treatment will obtain scans

## 2023-04-09 NOTE — Patient Instructions (Signed)
[facility]  Discharge Instructions: Thank you for choosing [facility] to provide your oncology and hematology care.   If you have a lab appointment with the Cancer Center, please go directly to the Cancer Center and check in at the registration area.   Wear comfortable clothing and clothing appropriate for easy access to any Portacath or PICC line.   We strive to give you quality time with your provider. You may need to reschedule your appointment if you arrive late (15 or more minutes).  Arriving late affects you and other patients whose appointments are after yours.  Also, if you miss three or more appointments without notifying the office, you may be dismissed from the clinic at the provider's discretion.      For prescription refill requests, have your pharmacy contact our office and allow 72 hours for refills to be completed.    Today you received the following chemotherapy and/or immunotherapy agents: Trastuzumab, Pertuzumab.      To help prevent nausea and vomiting after your treatment, we encourage you to take your nausea medication as directed.  BELOW ARE SYMPTOMS THAT SHOULD BE REPORTED IMMEDIATELY: *FEVER GREATER THAN 100.4 F (38 C) OR HIGHER *CHILLS OR SWEATING *NAUSEA AND VOMITING THAT IS NOT CONTROLLED WITH YOUR NAUSEA MEDICATION *UNUSUAL SHORTNESS OF BREATH *UNUSUAL BRUISING OR BLEEDING *URINARY PROBLEMS (pain or burning when urinating, or frequent urination) *BOWEL PROBLEMS (unusual diarrhea, constipation, pain near the anus) TENDERNESS IN MOUTH AND THROAT WITH OR WITHOUT PRESENCE OF ULCERS (sore throat, sores in mouth, or a toothache) UNUSUAL RASH, SWELLING OR PAIN  UNUSUAL VAGINAL DISCHARGE OR ITCHING   Items with * indicate a potential emergency and should be followed up as soon as possible or go to the Emergency Department if any problems should occur.  Please show the CHEMOTHERAPY ALERT CARD or IMMUNOTHERAPY  ALERT CARD at check-in to the Emergency Department and triage nurse.  Should you have questions after your visit or need to cancel or reschedule your appointment, please contact [facility]  Dept: [phone]  and follow the prompts.  Office hours are 8:00 a.m. to 4:30 p.m. Monday - Friday. Please note that voicemails left after 4:00 p.m. may not be returned until the following business day.  We are closed weekends and major holidays. You have access to a nurse at all times for urgent questions. Please call the main number to the clinic Dept: [phone] and follow the prompts.   For any non-urgent questions, you may also contact your provider using MyChart. We now offer e-Visits for anyone 18 and older to request care online for non-urgent symptoms. For details visit [email or web address].   Also download the MyChart app! Go to the app store, search "MyChart", open the app, select [place], and log in with your MyChart username and password.  

## 2023-04-11 ENCOUNTER — Ambulatory Visit: Payer: Medicare Other

## 2023-04-25 ENCOUNTER — Ambulatory Visit (INDEPENDENT_AMBULATORY_CARE_PROVIDER_SITE_OTHER): Payer: Medicare Other | Admitting: Plastic Surgery

## 2023-04-25 DIAGNOSIS — Z9889 Other specified postprocedural states: Secondary | ICD-10-CM

## 2023-04-25 NOTE — Progress Notes (Signed)
Allison Pierce returns today approximately 5 weeks postop from removal of her tissue expander and placement of a permanent implant.  She is happy with the results though she does note that the right side is still larger than the left side.  She still has some mild discomfort especially in the lateral aspect of the breast.  On examination the incisions are clean dry and intact her incisions are well-healed.  She has a very nice shape and size on the left side.  I have encouraged her to continue scar massage and to add massage of any areas that are causing her discomfort.  She is encouraged to increase her activity.  She will follow-up with me in 6 months and may return sooner for any questions or concerns.

## 2023-04-26 ENCOUNTER — Other Ambulatory Visit: Payer: Self-pay

## 2023-04-30 ENCOUNTER — Other Ambulatory Visit: Payer: Self-pay

## 2023-04-30 ENCOUNTER — Inpatient Hospital Stay: Payer: Medicare Other

## 2023-04-30 VITALS — BP 182/68 | HR 64 | Temp 98.2°F | Resp 18 | Ht 66.0 in | Wt 146.4 lb

## 2023-04-30 DIAGNOSIS — C50412 Malignant neoplasm of upper-outer quadrant of left female breast: Secondary | ICD-10-CM

## 2023-04-30 DIAGNOSIS — Z171 Estrogen receptor negative status [ER-]: Secondary | ICD-10-CM | POA: Diagnosis not present

## 2023-04-30 DIAGNOSIS — Z5112 Encounter for antineoplastic immunotherapy: Secondary | ICD-10-CM | POA: Diagnosis not present

## 2023-04-30 DIAGNOSIS — C7951 Secondary malignant neoplasm of bone: Secondary | ICD-10-CM | POA: Diagnosis not present

## 2023-04-30 DIAGNOSIS — Z9012 Acquired absence of left breast and nipple: Secondary | ICD-10-CM | POA: Diagnosis not present

## 2023-04-30 DIAGNOSIS — Z803 Family history of malignant neoplasm of breast: Secondary | ICD-10-CM | POA: Diagnosis not present

## 2023-04-30 MED ORDER — SODIUM CHLORIDE 0.9 % IV SOLN
420.0000 mg | Freq: Once | INTRAVENOUS | Status: AC
  Administered 2023-04-30: 420 mg via INTRAVENOUS
  Filled 2023-04-30: qty 14

## 2023-04-30 MED ORDER — SODIUM CHLORIDE 0.9% FLUSH
10.0000 mL | INTRAVENOUS | Status: DC | PRN
  Administered 2023-04-30: 10 mL

## 2023-04-30 MED ORDER — DIPHENHYDRAMINE HCL 25 MG PO CAPS
25.0000 mg | ORAL_CAPSULE | Freq: Once | ORAL | Status: AC
  Administered 2023-04-30: 25 mg via ORAL
  Filled 2023-04-30: qty 1

## 2023-04-30 MED ORDER — ACETAMINOPHEN 325 MG PO TABS
650.0000 mg | ORAL_TABLET | Freq: Once | ORAL | Status: AC
  Administered 2023-04-30: 650 mg via ORAL
  Filled 2023-04-30: qty 2

## 2023-04-30 MED ORDER — SODIUM CHLORIDE 0.9 % IV SOLN: Freq: Once | INTRAVENOUS | Status: AC

## 2023-04-30 MED ORDER — HEPARIN SOD (PORK) LOCK FLUSH 100 UNIT/ML IV SOLN
500.0000 [IU] | Freq: Once | INTRAVENOUS | Status: AC | PRN
  Administered 2023-04-30: 500 [IU]

## 2023-04-30 MED ORDER — TRASTUZUMAB-DKST CHEMO 150 MG IV SOLR
6.0000 mg/kg | Freq: Once | INTRAVENOUS | Status: AC
  Administered 2023-04-30: 420 mg via INTRAVENOUS
  Filled 2023-04-30: qty 20

## 2023-04-30 NOTE — Patient Instructions (Signed)
[facility]   Discharge Instructions: Thank you for choosing [facility] to provide your oncology and hematology care.   If you have a lab appointment with the Cancer Center, please go directly to the Cancer Center and check in at the registration area.   Wear comfortable clothing and clothing appropriate for easy access to any Portacath or PICC line.   We strive to give you quality time with your provider. You may need to reschedule your appointment if you arrive late (15 or more minutes).  Arriving late affects you and other patients whose appointments are after yours.  Also, if you miss three or more appointments without notifying the office, you may be dismissed from the clinic at the provider's discretion.      For prescription refill requests, have your pharmacy contact our office and allow 72 hours for refills to be completed.    Today you received the following chemotherapy and/or immunotherapy agents: Trastuzumab ([name]) and Pertuzumab (Perjeta)       To help prevent nausea and vomiting after your treatment, we encourage you to take your nausea medication as directed.  BELOW ARE SYMPTOMS THAT SHOULD BE REPORTED IMMEDIATELY: *FEVER GREATER THAN 100.4 F (38 C) OR HIGHER *CHILLS OR SWEATING *NAUSEA AND VOMITING THAT IS NOT CONTROLLED WITH YOUR NAUSEA MEDICATION *UNUSUAL SHORTNESS OF BREATH *UNUSUAL BRUISING OR BLEEDING *URINARY PROBLEMS (pain or burning when urinating, or frequent urination) *BOWEL PROBLEMS (unusual diarrhea, constipation, pain near the anus) TENDERNESS IN MOUTH AND THROAT WITH OR WITHOUT PRESENCE OF ULCERS (sore throat, sores in mouth, or a toothache) UNUSUAL RASH, SWELLING OR PAIN  UNUSUAL VAGINAL DISCHARGE OR ITCHING   Items with * indicate a potential emergency and should be followed up as soon as possible or go to the Emergency Department if any problems should occur.  Please show the CHEMOTHERAPY ALERT  CARD or IMMUNOTHERAPY ALERT CARD at check-in to the Emergency Department and triage nurse.  Should you have questions after your visit or need to cancel or reschedule your appointment, please contact [facility]  Dept: [phone]  and follow the prompts.  Office hours are 8:00 a.m. to 4:30 p.m. Monday - Friday. Please note that voicemails left after 4:00 p.m. may not be returned until the following business day.  We are closed weekends and major holidays. You have access to a nurse at all times for urgent questions. Please call the main number to the clinic Dept: [phone] and follow the prompts.   For any non-urgent questions, you may also contact your provider using MyChart. We now offer e-Visits for anyone 18 and older to request care online for non-urgent symptoms. For details visit [email or web address].   Also download the MyChart app! Go to the app store, search "MyChart", open the app, select [place], and log in with your MyChart username and password.   

## 2023-04-30 NOTE — Progress Notes (Signed)
Patient declined to stay for 30 minute post-observation period following Perjeta infusion.  Patient's vital signs retaken prior to discharge. Received okay to d/c from Dr. Pamelia Hoit for elevated BP. Patient discharged in stable condition. No complaints of headache or chest pain.

## 2023-05-04 ENCOUNTER — Encounter: Payer: Self-pay | Admitting: Hematology and Oncology

## 2023-05-04 NOTE — Progress Notes (Signed)
This encounter was created in error - please disregard.

## 2023-05-09 ENCOUNTER — Telehealth: Payer: Self-pay

## 2023-05-09 DIAGNOSIS — C50412 Malignant neoplasm of upper-outer quadrant of left female breast: Secondary | ICD-10-CM

## 2023-05-09 NOTE — Telephone Encounter (Signed)
Pt called c/o BLE worsening neuropathy causing cramps, as well as worsening mouth sores. Magic Mouthwash not helping. 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 Last tx 04/30/23. Last MD note indicated pt reporting neuropathy but she reports it is worse since then. Pt was offered appt with [facility] Jae Dire, PA for 8/8 at 1100 and 1030 port flush w/lab. Pt accepted this appt. Labs entered per PA for CBC, CMP and mag.

## 2023-05-10 ENCOUNTER — Inpatient Hospital Stay (HOSPITAL_BASED_OUTPATIENT_CLINIC_OR_DEPARTMENT_OTHER): Payer: Medicare Other | Admitting: Physician Assistant

## 2023-05-10 ENCOUNTER — Other Ambulatory Visit: Payer: Self-pay

## 2023-05-10 ENCOUNTER — Inpatient Hospital Stay: Payer: Medicare Other | Attending: Hematology and Oncology

## 2023-05-10 ENCOUNTER — Encounter: Payer: Self-pay | Admitting: Hematology and Oncology

## 2023-05-10 ENCOUNTER — Other Ambulatory Visit (HOSPITAL_COMMUNITY): Payer: Self-pay

## 2023-05-10 VITALS — BP 173/80 | HR 70 | Temp 97.4°F | Resp 18 | Ht 66.0 in | Wt 147.7 lb

## 2023-05-10 DIAGNOSIS — Z9012 Acquired absence of left breast and nipple: Secondary | ICD-10-CM | POA: Insufficient documentation

## 2023-05-10 DIAGNOSIS — C7951 Secondary malignant neoplasm of bone: Secondary | ICD-10-CM | POA: Diagnosis not present

## 2023-05-10 DIAGNOSIS — Z95828 Presence of other vascular implants and grafts: Secondary | ICD-10-CM

## 2023-05-10 DIAGNOSIS — C50412 Malignant neoplasm of upper-outer quadrant of left female breast: Secondary | ICD-10-CM | POA: Insufficient documentation

## 2023-05-10 DIAGNOSIS — Z171 Estrogen receptor negative status [ER-]: Secondary | ICD-10-CM

## 2023-05-10 DIAGNOSIS — K1230 Oral mucositis (ulcerative), unspecified: Secondary | ICD-10-CM | POA: Diagnosis not present

## 2023-05-10 DIAGNOSIS — G629 Polyneuropathy, unspecified: Secondary | ICD-10-CM

## 2023-05-10 DIAGNOSIS — Z5112 Encounter for antineoplastic immunotherapy: Secondary | ICD-10-CM | POA: Diagnosis not present

## 2023-05-10 DIAGNOSIS — Z79899 Other long term (current) drug therapy: Secondary | ICD-10-CM | POA: Diagnosis not present

## 2023-05-10 LAB — MAGNESIUM: Magnesium: 1.7 mg/dL (ref 1.7–2.4)

## 2023-05-10 LAB — CBC WITH DIFFERENTIAL (CANCER CENTER ONLY)
Abs Immature Granulocytes: 0.03 10*3/uL (ref 0.00–0.07)
Basophils Absolute: 0.1 10*3/uL (ref 0.0–0.1)
Basophils Relative: 1 %
Eosinophils Absolute: 0.2 10*3/uL (ref 0.0–0.5)
Eosinophils Relative: 3 %
HCT: 31.7 % — ABNORMAL LOW (ref 36.0–46.0)
Hemoglobin: 10.8 g/dL — ABNORMAL LOW (ref 12.0–15.0)
Immature Granulocytes: 1 %
Lymphocytes Relative: 22 %
Lymphs Abs: 1.2 10*3/uL (ref 0.7–4.0)
MCH: 27.9 pg (ref 26.0–34.0)
MCHC: 34.1 g/dL (ref 30.0–36.0)
MCV: 81.9 fL (ref 80.0–100.0)
Monocytes Absolute: 0.5 10*3/uL (ref 0.1–1.0)
Monocytes Relative: 9 %
Neutro Abs: 3.4 10*3/uL (ref 1.7–7.7)
Neutrophils Relative %: 64 %
Platelet Count: 240 10*3/uL (ref 150–400)
RBC: 3.87 MIL/uL (ref 3.87–5.11)
RDW: 13.2 % (ref 11.5–15.5)
WBC Count: 5.3 10*3/uL (ref 4.0–10.5)
nRBC: 0 % (ref 0.0–0.2)

## 2023-05-10 LAB — CMP (CANCER CENTER ONLY)
ALT: 15 U/L (ref 0–44)
AST: 19 U/L (ref 15–41)
Albumin: 3.9 g/dL (ref 3.5–5.0)
Alkaline Phosphatase: 83 U/L (ref 38–126)
Anion gap: 5 (ref 5–15)
BUN: 7 mg/dL — ABNORMAL LOW (ref 8–23)
CO2: 29 mmol/L (ref 22–32)
Calcium: 8.7 mg/dL — ABNORMAL LOW (ref 8.9–10.3)
Chloride: 100 mmol/L (ref 98–111)
Creatinine: 0.94 mg/dL (ref 0.44–1.00)
GFR, Estimated: >60 mL/min (ref >60)
Glucose, Bld: 100 mg/dL — ABNORMAL HIGH (ref 70–99)
Potassium: 3.8 mmol/L (ref 3.5–5.1)
Sodium: 134 mmol/L — ABNORMAL LOW (ref 135–145)
Total Bilirubin: 0.5 mg/dL (ref 0.3–1.2)
Total Protein: 6.1 g/dL — ABNORMAL LOW (ref 6.5–8.1)

## 2023-05-10 MED ORDER — GABAPENTIN 100 MG PO CAPS
100.0000 mg | ORAL_CAPSULE | Freq: Every day | ORAL | 0 refills | Status: DC
  Filled 2023-05-10: qty 14, 14d supply, fill #0

## 2023-05-10 MED ORDER — SODIUM CHLORIDE 0.9% FLUSH
10.0000 mL | Freq: Once | INTRAVENOUS | Status: AC
  Administered 2023-05-10: 10 mL

## 2023-05-10 MED ORDER — ALUM & MAG HYDROXIDE-SIMETH 200-200-20 MG/5ML PO SUSP
5.0000 mL | Freq: Four times a day (QID) | ORAL | 1 refills | Status: AC | PRN
Start: 2023-05-10 — End: ?
  Filled 2023-05-10: qty 240, 12d supply, fill #0

## 2023-05-10 NOTE — Progress Notes (Signed)
Symptom Management Consult Note [facility]    Patient Care Team: Lupita Raider, MD as PCP - General (Family Medicine) Van Clines, MD as Consulting Physician (Neurology) Pershing Proud, RN as Oncology Nurse Navigator Donnelly Angelica, RN as Oncology Nurse Navigator Abigail Miyamoto, MD as Consulting Physician (General Surgery) Serena Croissant, MD as Consulting Physician (Hematology and Oncology) Lonie Peak, MD as Attending Physician (Radiation Oncology)    Name / MRN / DOB: Allison Pierce  [id number]  11/08/1945   Date of visit: 05/10/2023   Chief Complaint/Reason for visit: mouth sores, cramps   Current Therapy: herceptin/perjeta   Last treatment:  Day 1   Cycle 15 on 04/30/23   ASSESSMENT & PLAN: Patient is a 77 y.o. female with oncologic history of malignant neoplasm of upper-outer quadrant of left breast in female, estrogen receptor negative followed by Dr. Pamelia Hoit.  I have viewed most recent oncology note and lab work.    #Malignant neoplasm of upper-outer quadrant of left breast in female, estrogen receptor negative  - Next appointment with oncologist is 05/21/23   #Mouth sores -Grade 1. Prescription for magic mouthwash sent to pharmacy -Tolerating PO intake and declines need for IVF today   #Neuropathy -Neuro exam without weakness. -Labs without significant electrolyte derangement.  -Discussed with Dr. Pamelia Hoit who agrees with starting low dose gabapentin at bedtime. Will send 14 day prescription as trial. If tolerating well she can request refills at upcoming appointment. Also sent referral for PT eval as patient reporting foot drop x several months.   Heme/Onc History: Oncology History  Malignant neoplasm of upper-outer quadrant of left breast in female, estrogen receptor negative (HCC)  02/10/2022 Initial Diagnosis   Palpable left breast masses and calcifications: 1.8 cm at 1:00 and 1.7 cm at 9:00, calcifications not biopsy.  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 Report date is 03/05/2022.  The CustomNext-Cancer+RNAinsight panel offered by [facility] includes sequencing and rearrangement analysis for the following 48 genes:  APC, ATM, AXIN2, BARD1, BMPR1A, BRCA1, BRCA2, BRIP1, CDH1, CDK4, CDKN2A, CHEK2, CTNNA1, DICER1, EGFR, EPCAM, GREM1, HOXB13, KIT, MEN1, MLH1, MSH2, MSH3, MSH6, MUTYH, NBN, NF1, NTHL1, PALB2, PDGFRA, PMS2, POLD1, POLE, PTEN, RAD50, RAD51C, RAD51D, SDHA, SDHB, SDHC, SDHD, SMAD4, SMARCA4, STK11, TP53, TSC1, TSC2, and VHL.  RNA data is routinely analyzed for use in variant interpretation for all genes.   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, LCIS, 5/5 lymph nodes positive with extranodal extension, margins negative ER 0%, PR 0%, HER2 3+, Ki-67 20%   04/27/2022 Cancer Staging   Staging form: Breast, AJCC 8th Edition - Pathologic: Stage IIIA (pT2, pN2, cM0, G2, ER-, PR-, HER2+) - Signed by Serena Croissant, MD on 04/27/2022 Histologic grading system: 3 grade system   05/05/2022 - 05/26/2022 Chemotherapy   Patient is on Treatment Plan : BREAST DOCEtaxel + Trastuzumab + Pertuzumab (THP) q21d x 8 cycles / Trastuzumab + Pertuzumab q21d x 4 cycles     05/26/2022 -  Chemotherapy   Patient is on Treatment Plan : BREAST MAINTENANCE  Trastuzumab IV (6) or SQ (600) D1 q21d X 11 Cycles         Interval history-: Allison Pierce is a 77 y.o. female with oncologic history as above presenting to [facility] today with chief complaint of mouth sores and cramps.  She is  accompanied by spouse who provides additional history.   Patient states ever since she started treatment she has struggled with recurrent mouth sores and cramping in her legs.  She previously has used Magic mouthwash with symptom resolution however has been out for several weeks now.  Patient is tolerating p.o. intake, just has to avoid acidic foods as that causes burning pain in her mouth.  She is drinking at least 40 ounces of fluids daily.  Patient states she first noticed tingling sensation in her legs after starting chemo and it is progressively worsening.  Tingling is constant.  She is also reporting it causes right foot drop and several near falls.  She denies any swelling in her legs.  Denies any pain in her back.  Denies any urinary retention or incontinence, no constipation.  The tingling is keeping her awake at night.  She reports discussing this with oncologist at recent visits however has not yet started any medical intervention.  Denies any fever or chills.   ROS  All other systems are reviewed and are negative for acute change except as noted in the HPI.    Allergies  Allergen Reactions   Oxybutynin Other (See Comments)    Unknown per Pt    Prozac [Fluoxetine Hcl] Other (See Comments)    "Could not lift head up or get out of bed"    Ritalin [Methylphenidate] Other (See Comments)    Unknown per Pt    Rosuvastatin Other (See Comments)    Unknown per Pt    Lyrica [Pregabalin] Anxiety and Other (See Comments)    "Felt weird"     Past Medical History:  Diagnosis Date   Anxiety    Cancer (HCC)    Depression    GERD (gastroesophageal reflux disease) OCCASIONALLY  TAKE TUMS   Human papilloma virus    Hyperlipidemia    Hypertension    UTI (urinary tract infection)    Vulvar lesion      Past Surgical History:  Procedure Laterality Date   ABDOMINAL HYSTERECTOMY  1992   W/ SALPINGO-OOPHORECTOMY   ANTERIOR CERVICAL DECOMP/DISCECTOMY FUSION  01-18-2009  DR POOLE   C4  -  C6   AUGMENTATION MAMMAPLASTY Bilateral    BREAST BIOPSY Right    BREAST BIOPSY Left 02/10/2022   x2   BREAST ENHANCEMENT SURGERY  2011   BREAST EXCISIONAL BIOPSY Left    BREAST RECONSTRUCTION WITH PLACEMENT OF TISSUE EXPANDER AND FLEX HD (ACELLULAR HYDRATED DERMIS) Left 04/07/2022   Procedure: BREAST RECONSTRUCTION WITH PLACEMENT OF TISSUE EXPANDER AND FLEX HD (ACELLULAR HYDRATED DERMIS);  Surgeon: Allena Napoleon, MD;  Location: [facility] OR;  Service: Plastics;  Laterality: Left;   BREAST SURGERY  02-04-2011  dr Jamey Ripa   EXCISION LEFT BREAST MASS--  CALCIFICATION   EYE SURGERY Bilateral    cataract removal   MASTECTOMY W/ SENTINEL NODE BIOPSY Left 04/07/2022   Procedure: LEFT MASTECTOMY WITH SENTINEL NODE BIOPSY;  Surgeon: Abigail Miyamoto, MD;  Location: [facility] OR;  Service: General;  Laterality: Left;  LMA   PORTACATH PLACEMENT N/A 04/07/2022   Procedure: PORT-A-CATH INSERTION WITH ULTRASOUND GUIDANCE;  Surgeon: Abigail Miyamoto, MD;  Location: [facility] OR;  Service: General;  Laterality: N/A;  REMOVAL OF TISSUE EXPANDER AND PLACEMENT OF IMPLANT Left 03/16/2023   Procedure: Removal of left breast tissue expander and placement of permanent implant;  Surgeon: [name], MD;  Location: [facility];  Service: Plastics;  Laterality: Left;   VULVAR LESION REMOVAL  09/10/2012   Procedure: VULVAR LESION;  Surgeon: Gretta Cool, MD;  Location: [facility];  Service: Gynecology;  Laterality: N/A;  WIDE EXCISION OF VULVAR LESION    Social History   Socioeconomic History   Marital status: Married    Spouse name: Fayrene Fearing   Number of children: 1   Years of education: 12   Highest education level: Not on file  Occupational History   Not on file  Tobacco Use   Smoking status: Never   Smokeless tobacco: Never  Vaping Use   Vaping status: Never Used  Substance and Sexual Activity   Alcohol use: Yes    Alcohol/week: 7.0 standard drinks of alcohol    Types: 7 Cans of beer  per week   Drug use: Never   Sexual activity: Not Currently  Other Topics Concern   Not on file  Social History Narrative   Graduated HS      Right handed      Lives with husband   Social Determinants of Health   Financial Resource Strain: Low Risk  (02/22/2022)   Overall Financial Resource Strain (CARDIA)    Difficulty of Paying Living Expenses: Not hard at all  Food Insecurity: No Food Insecurity (02/22/2022)   Hunger Vital Sign    Worried About Running Out of Food in the Last Year: Never true    Ran Out of Food in the Last Year: Never true  Transportation Needs: No Transportation Needs (02/22/2022)   PRAPARE - Administrator, Civil Service (Medical): No    Lack of Transportation (Non-Medical): No  Physical Activity: Not on file  Stress: Not on file  Social Connections: Not on file  Intimate Partner Violence: Not on file    Family History  Problem Relation Age of Onset   Lung cancer Mother 62       she smoked   Prostate cancer Brother 27   Breast cancer Cousin        paternal first cousin   Breast cancer Cousin        paternal first cousin     Current Outpatient Medications:    gabapentin (NEURONTIN) 100 MG capsule, Take 1 capsule (100 mg total) by mouth at bedtime., Disp: 14 capsule, Rfl: 0   B Complex-C (B COMPLEX-VITAMIN C) CAPS, Take 1 capsule by mouth daily., Disp: , Rfl:    buPROPion (WELLBUTRIN XL) 300 MG 24 hr tablet, Take 300 mg by mouth every morning., Disp: , Rfl:    cholecalciferol (VITAMIN D3) 25 MCG (1000 UNIT) tablet, Take 1,000 Units by mouth daily., Disp: , Rfl:    clonazePAM (KLONOPIN) 1 MG tablet, Take 1 mg by mouth at bedtime., Disp: , Rfl:    Cyanocobalamin (VITAMIN B-12) 5000 MCG TBDP, Take 5,000 Units by mouth daily., Disp: , Rfl:    diphenhydrAMINE (BENADRYL) 25 MG tablet, Take 25 mg by mouth in the morning., Disp: , Rfl:    fluticasone (FLONASE) 50 MCG/ACT nasal spray, Place 1 spray into both nostrils daily as needed for  allergies., Disp: , Rfl:    lidocaine-prilocaine (EMLA) cream, Apply 1 Application topically as needed. Apply one application daily as needed to port a cath site., Disp: 30  g, Rfl: 3   losartan (COZAAR) 100 MG tablet, Take 1 tablet (100 mg total) by mouth daily., Disp: 90 tablet, Rfl: 3   magic mouthwash (lidocaine, diphenhydrAMINE, alum & mag hydroxide) suspension, Swish and spit 5 mLs by mouth 4 (four) times daily as needed for mouth pain., Disp: 240 mL, Rfl: 1   meloxicam (MOBIC) 15 MG tablet, Take 15 mg by mouth daily., Disp: , Rfl:    methylphenidate (RITALIN) 5 MG tablet, Take 5 mg by mouth 2 (two) times daily as needed., Disp: , Rfl:    Metoprolol Succinate (TOPROL XL PO), Take by mouth. Per 02/15/23 note by Dr. Clarise Cruz, cardiologist; pt does not know dosage and this is not listed in med list, Disp: , Rfl:    Potassium 99 MG TABS, Take 99 mg by mouth at bedtime., Disp: , Rfl:    traZODone (DESYREL) 150 MG tablet, Take 225 mg by mouth at bedtime., Disp: , Rfl:    venlafaxine XR (EFFEXOR-XR) 75 MG 24 hr capsule, Take 75 mg by mouth daily with breakfast., Disp: , Rfl:   PHYSICAL EXAM: ECOG FS:1 - Symptomatic but completely ambulatory    Vitals:   05/10/23 1050  BP: (!) 173/80  Pulse: 70  Resp: 18  Temp: (!) 97.4 F (36.3 C)  TempSrc: Oral  SpO2: 99%  Weight: 147 lb 11.2 oz (67 kg)  Height: 5\' 6"  (1.676 m)   Physical Exam Vitals and nursing note reviewed.  Constitutional:      Appearance: She is not ill-appearing or toxic-appearing.  HENT:     Head: Normocephalic.     Comments: Faint erythema to buccal mucosa Eyes:     Conjunctiva/sclera: Conjunctivae normal.  Cardiovascular:     Rate and Rhythm: Normal rate.  Pulmonary:     Effort: Pulmonary effort is normal.  Abdominal:     General: There is no distension.  Musculoskeletal:     Cervical back: Normal range of motion.     Right lower leg: No edema.     Left lower leg: No edema.  Skin:    General: Skin is warm and  dry.     Comments: Equal tactile temperature to BLE  Neurological:     Mental Status: She is alert.     Comments: Sensation grossly intact to light touch in the lower extremities bilaterally. No saddle anesthesias. Strength 5/5 with flexion and extension at the bilateral hips, knees, and ankles. No noted gait deficit. Coordination intact with heel to shin testing.        LABORATORY DATA: I have reviewed the data as listed    Latest Ref Rng & Units 05/10/2023   10:15 AM 04/09/2023   11:30 AM 12/14/2022   10:59 AM  CBC  WBC 4.0 - 10.5 K/uL 5.3  5.9  7.7   Hemoglobin 12.0 - 15.0 g/dL 16.1  09.6  04.5   Hematocrit 36.0 - 46.0 % 31.7  33.3  31.5   Platelets 150 - 400 K/uL 240  279  305         Latest Ref Rng & Units 05/10/2023   10:15 AM 04/09/2023   11:30 AM 12/14/2022   10:59 AM  CMP  Glucose 70 - 99 mg/dL 409  98  811   BUN 8 - 23 mg/dL 7  13  12    Creatinine 0.44 - 1.00 mg/dL 9.14  7.82  9.56   Sodium 135 - 145 mmol/L 134  134  135   Potassium 3.5 - 5.1  mmol/L 3.8  4.1  4.3   Chloride 98 - 111 mmol/L 100  102  103   CO2 22 - 32 mmol/L 29  27  27    Calcium 8.9 - 10.3 mg/dL 8.7  8.9  8.6   Total Protein 6.5 - 8.1 g/dL 6.1  6.0  5.8   Total Bilirubin 0.3 - 1.2 mg/dL 0.5  0.5  0.4   Alkaline Phos 38 - 126 U/L 83  88  79   AST 15 - 41 U/L 19  18  18    ALT 0 - 44 U/L 15  17  15         RADIOGRAPHIC STUDIES (from last 24 hours if applicable) I have personally reviewed the radiological images as listed and agreed with the findings in the report. No results found.      Visit Diagnosis: 1. Malignant neoplasm of upper-outer quadrant of left breast in female, estrogen receptor negative (HCC)   2. Mucositis   3. Neuropathy      Orders Placed This Encounter  Procedures   Ambulatory referral to Home Health    Referral Priority:   Routine    Referral Type:   Home Health Care    Referral Reason:   Specialty Services Required    Referred to Provider:   [facility]    Requested Specialty:   Home Health Services    Number of Visits Requested:   1    All questions were answered. The patient knows to call the clinic with any problems, questions or concerns. No barriers to learning was detected.  A total of more than 30 minutes were spent on this encounter with face-to-face time and non-face-to-face time, including preparing to see the patient, ordering tests and/or medications, counseling the patient and coordination of care as outlined above.    Thank you for allowing me to participate in the care of this patient.    Shanon Ace, PA-C Department of Hematology/Oncology [facility] at [facility] Phone: [phone]  [phone]    05/10/2023 12:43 PM

## 2023-05-14 ENCOUNTER — Telehealth: Payer: Self-pay | Admitting: Hematology and Oncology

## 2023-05-14 NOTE — Telephone Encounter (Signed)
Scheduled appointments per WQ. Patient is aware of the made appointments.  

## 2023-05-15 ENCOUNTER — Encounter (HOSPITAL_COMMUNITY): Payer: Self-pay

## 2023-05-15 ENCOUNTER — Ambulatory Visit (HOSPITAL_COMMUNITY)
Admission: RE | Admit: 2023-05-15 | Discharge: 2023-05-15 | Disposition: A | Discharge status: Home / Self Care | Payer: Medicare Other | Source: Ambulatory Visit | Attending: Hematology and Oncology | Admitting: Hematology and Oncology

## 2023-05-15 DIAGNOSIS — C50412 Malignant neoplasm of upper-outer quadrant of left female breast: Secondary | ICD-10-CM | POA: Insufficient documentation

## 2023-05-15 DIAGNOSIS — R935 Abnormal findings on diagnostic imaging of other abdominal regions, including retroperitoneum: Secondary | ICD-10-CM | POA: Diagnosis not present

## 2023-05-15 DIAGNOSIS — C50912 Malignant neoplasm of unspecified site of left female breast: Secondary | ICD-10-CM | POA: Diagnosis not present

## 2023-05-15 DIAGNOSIS — C7951 Secondary malignant neoplasm of bone: Secondary | ICD-10-CM | POA: Diagnosis not present

## 2023-05-15 DIAGNOSIS — Z171 Estrogen receptor negative status [ER-]: Secondary | ICD-10-CM | POA: Diagnosis not present

## 2023-05-15 DIAGNOSIS — K5730 Diverticulosis of large intestine without perforation or abscess without bleeding: Secondary | ICD-10-CM | POA: Diagnosis not present

## 2023-05-15 MED ORDER — IOHEXOL 300 MG/ML  SOLN
100.0000 mL | Freq: Once | INTRAMUSCULAR | Status: AC | PRN
  Administered 2023-05-15: 100 mL via INTRAVENOUS

## 2023-05-15 MED ORDER — HEPARIN SOD (PORK) LOCK FLUSH 100 UNIT/ML IV SOLN
500.0000 [IU] | Freq: Once | INTRAVENOUS | Status: AC
  Administered 2023-05-15: 500 [IU] via INTRAVENOUS

## 2023-05-15 MED ORDER — SODIUM CHLORIDE (PF) 0.9 % IJ SOLN
INTRAMUSCULAR | Status: AC
  Filled 2023-05-15: qty 50

## 2023-05-15 MED ORDER — HEPARIN SOD (PORK) LOCK FLUSH 100 UNIT/ML IV SOLN
INTRAVENOUS | Status: AC
  Filled 2023-05-15: qty 5

## 2023-05-21 ENCOUNTER — Inpatient Hospital Stay (HOSPITAL_BASED_OUTPATIENT_CLINIC_OR_DEPARTMENT_OTHER): Payer: Medicare Other | Admitting: Hematology and Oncology

## 2023-05-21 ENCOUNTER — Inpatient Hospital Stay: Payer: Medicare Other

## 2023-05-21 VITALS — BP 160/81 | HR 77 | Temp 98.1°F | Resp 18 | Ht 66.0 in | Wt 145.5 lb

## 2023-05-21 DIAGNOSIS — Z5112 Encounter for antineoplastic immunotherapy: Secondary | ICD-10-CM | POA: Diagnosis not present

## 2023-05-21 DIAGNOSIS — C50412 Malignant neoplasm of upper-outer quadrant of left female breast: Secondary | ICD-10-CM

## 2023-05-21 DIAGNOSIS — C7951 Secondary malignant neoplasm of bone: Secondary | ICD-10-CM | POA: Diagnosis not present

## 2023-05-21 DIAGNOSIS — Z79899 Other long term (current) drug therapy: Secondary | ICD-10-CM | POA: Diagnosis not present

## 2023-05-21 DIAGNOSIS — K529 Noninfective gastroenteritis and colitis, unspecified: Secondary | ICD-10-CM | POA: Diagnosis not present

## 2023-05-21 DIAGNOSIS — Z9012 Acquired absence of left breast and nipple: Secondary | ICD-10-CM | POA: Diagnosis not present

## 2023-05-21 DIAGNOSIS — Z171 Estrogen receptor negative status [ER-]: Secondary | ICD-10-CM

## 2023-05-21 MED ORDER — TRASTUZUMAB-DKST CHEMO 150 MG IV SOLR
6.0000 mg/kg | Freq: Once | INTRAVENOUS | Status: AC
  Administered 2023-05-21: 420 mg via INTRAVENOUS
  Filled 2023-05-21: qty 20

## 2023-05-21 MED ORDER — ACETAMINOPHEN 325 MG PO TABS
650.0000 mg | ORAL_TABLET | Freq: Once | ORAL | Status: AC
  Administered 2023-05-21: 650 mg via ORAL
  Filled 2023-05-21: qty 2

## 2023-05-21 MED ORDER — SODIUM CHLORIDE 0.9% FLUSH
10.0000 mL | INTRAVENOUS | Status: DC | PRN
  Administered 2023-05-21: 10 mL

## 2023-05-21 MED ORDER — HEPARIN SOD (PORK) LOCK FLUSH 100 UNIT/ML IV SOLN
500.0000 [IU] | Freq: Once | INTRAVENOUS | Status: AC | PRN
  Administered 2023-05-21: 500 [IU]

## 2023-05-21 MED ORDER — DIPHENHYDRAMINE HCL 25 MG PO CAPS
25.0000 mg | ORAL_CAPSULE | Freq: Once | ORAL | Status: AC
  Administered 2023-05-21: 25 mg via ORAL
  Filled 2023-05-21: qty 1

## 2023-05-21 MED ORDER — SODIUM CHLORIDE 0.9 % IV SOLN: Freq: Once | INTRAVENOUS | Status: AC

## 2023-05-21 NOTE — Progress Notes (Signed)
Patient Care Team: Lupita Raider, MD as PCP - General (Family Medicine) Van Clines, MD as Consulting Physician (Neurology) Pershing Proud, RN as Oncology Nurse Navigator Donnelly Angelica, RN as Oncology Nurse Navigator Abigail Miyamoto, MD as Consulting Physician (General Surgery) Serena Croissant, MD as Consulting Physician (Hematology and Oncology) Lonie Peak, MD as Attending Physician (Radiation Oncology)  DIAGNOSIS:  Encounter Diagnoses  Name Primary?   Malignant neoplasm of upper-outer quadrant of left breast in female, estrogen receptor negative (HCC) Yes   Colitis     SUMMARY OF ONCOLOGIC HISTORY: Oncology History  Malignant neoplasm of upper-outer quadrant of left breast in female, estrogen receptor negative (HCC)  02/10/2022 Initial Diagnosis   Palpable left breast masses and calcifications: 1.8 cm at 1:00 and 1.7 cm at 9:00, calcifications not biopsy.  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 Report date is 03/05/2022.  The CustomNext-Cancer+RNAinsight panel offered by [facility] includes sequencing and rearrangement analysis for the following 48 genes:  APC, ATM, AXIN2, BARD1, BMPR1A, BRCA1, BRCA2, BRIP1, CDH1, CDK4, CDKN2A, CHEK2, CTNNA1, DICER1, EGFR, EPCAM, GREM1, HOXB13, KIT, MEN1, MLH1, MSH2, MSH3, MSH6, MUTYH, NBN, NF1, NTHL1, PALB2, PDGFRA, PMS2, POLD1, POLE, PTEN, RAD50, RAD51C, RAD51D, SDHA, SDHB, SDHC, SDHD, SMAD4, SMARCA4, STK11, TP53, TSC1, TSC2, and VHL.  RNA data is routinely analyzed for use in variant interpretation for all genes.   04/07/2022 Surgery    Left mastectomy: 4.2 cm invasive pleomorphic lobular carcinoma grade 2, LCIS, 5/5 lymph nodes positive with extranodal extension, margins negative ER 0%, PR 0%, HER2 3+, Ki-67 20%   04/27/2022 Cancer Staging   Staging form: Breast, AJCC 8th Edition - Pathologic: Stage IIIA (pT2, pN2, cM0, G2, ER-, PR-, HER2+) - Signed by Serena Croissant, MD on 04/27/2022 Histologic grading system: 3 grade system   05/05/2022 - 05/26/2022 Chemotherapy   Patient is on Treatment Plan : BREAST DOCEtaxel + Trastuzumab + Pertuzumab (THP) q21d x 8 cycles / Trastuzumab + Pertuzumab q21d x 4 cycles     05/26/2022 -  Chemotherapy   Patient is on Treatment Plan : BREAST MAINTENANCE Trastuzumab IV (6) or SQ (600) D1 q21d X 11 Cycles       CHIEF COMPLIANT: Follow up herceptin/ Scans, continues to have diarrhea       INTERVAL HISTORY: Allison Pierce is a 77 y.o with the above-mentioned metastatic breast cancer currently on herceptin perjeta. She presents to the clinic for a follow-up. Patient reports that she is having some diarrhea. She says the neuropathy is still there but it is getting better.  She denies any abdominal pain nausea vomiting.   ALLERGIES:  is allergic to oxybutynin, prozac [fluoxetine hcl], ritalin [methylphenidate], rosuvastatin, and lyrica [pregabalin].  MEDICATIONS:  Current Outpatient Medications  Medication Sig Dispense Refill   B Complex-C (B COMPLEX-VITAMIN C) CAPS Take 1 capsule by mouth daily.     buPROPion (WELLBUTRIN XL) 300 MG 24 hr tablet Take 300 mg by mouth every morning.     cholecalciferol (VITAMIN D3) 25 MCG (1000 UNIT) tablet Take 1,000 Units by mouth daily.     clonazePAM (KLONOPIN) 1  MG tablet Take 1 mg by mouth at bedtime.     Cyanocobalamin (VITAMIN B-12) 5000 MCG TBDP Take 5,000 Units by mouth daily.     diphenhydrAMINE (BENADRYL) 25 MG tablet Take 25 mg by mouth in the morning.     fluticasone (FLONASE) 50 MCG/ACT nasal spray Place 1 spray into both nostrils daily as  needed for allergies.     gabapentin (NEURONTIN) 100 MG capsule Take 1 capsule (100 mg total) by mouth at bedtime. 14 capsule 0   Lidocaine-Prilocaine (EMLA EX) EMLA     lidocaine-prilocaine (EMLA) cream Apply 1 Application topically as needed. Apply one application daily as needed to port a cath site. 30 g 3   losartan (COZAAR) 100 MG tablet Take 1 tablet (100 mg total) by mouth daily. 90 tablet 3   magic mouthwash (lidocaine, diphenhydrAMINE, alum & mag hydroxide) suspension Swish and spit 5 mLs by mouth 4 (four) times daily as needed for mouth pain. 240 mL 1   meloxicam (MOBIC) 15 MG tablet Take 15 mg by mouth daily.     methylphenidate (RITALIN) 5 MG tablet Take 5 mg by mouth 2 (two) times daily as needed.     Metoprolol Succinate (TOPROL XL PO) Take by mouth. Per 02/15/23 note by Dr. Clarise Cruz, cardiologist; pt does not know dosage and this is not listed in med list     Potassium 99 MG TABS Take 99 mg by mouth at bedtime.     traZODone (DESYREL) 150 MG tablet Take 225 mg by mouth at bedtime.     venlafaxine XR (EFFEXOR-XR) 75 MG 24 hr capsule Take 75 mg by mouth daily with breakfast.     No current facility-administered medications for this visit.   Facility-Administered Medications Ordered in Other Visits  Medication Dose Route Frequency Provider Last Rate Last Admin   heparin lock flush 100 unit/mL  500 Units Intracatheter Once PRN Serena Croissant, MD       sodium chloride flush (NS) 0.9 % injection 10 mL  10 mL Intracatheter PRN Serena Croissant, MD       trastuzumab-dkst (OGIVRI) 420 mg in sodium chloride 0.9 % 250 mL chemo infusion  6 mg/kg (Treatment Plan Recorded) Intravenous Once Serena Croissant, MD        PHYSICAL EXAMINATION: ECOG PERFORMANCE STATUS: 1 - Symptomatic but completely ambulatory  Vitals:   05/21/23 1147  BP: (!) 160/81  Pulse: 77  Resp: 18  Temp: 98.1 F (36.7 C)  SpO2: 98%   Filed Weights   05/21/23 1147  Weight: 145 lb 8 oz (66 kg)      LABORATORY DATA:   I have reviewed the data as listed    Latest Ref Rng & Units 05/10/2023   10:15 AM 04/09/2023   11:30 AM 12/14/2022   10:59 AM  CMP  Glucose 70 - 99 mg/dL 161  98  096   BUN 8 - 23 mg/dL 7  13  12    Creatinine 0.44 - 1.00 mg/dL 0.45  4.09  8.11   Sodium 135 - 145 mmol/L 134  134  135   Potassium 3.5 - 5.1 mmol/L 3.8  4.1  4.3   Chloride 98 - 111 mmol/L 100  102  103   CO2 22 - 32 mmol/L 29  27  27    Calcium 8.9 - 10.3 mg/dL 8.7  8.9  8.6   Total Protein 6.5 - 8.1 g/dL 6.1  6.0  5.8   Total Bilirubin 0.3 - 1.2 mg/dL 0.5  0.5  0.4   Alkaline Phos 38 - 126 U/L 83  88  79   AST 15 - 41 U/L 19  18  18    ALT 0 - 44 U/L 15  17  15      Lab Results  Component Value Date   WBC 5.3 05/10/2023   HGB 10.8 (L) 05/10/2023   HCT 31.7 (L) 05/10/2023   MCV 81.9 05/10/2023   PLT 240 05/10/2023   NEUTROABS 3.4 05/10/2023    ASSESSMENT & PLAN:  Malignant neoplasm of upper-outer quadrant of left breast in female, estrogen receptor negative (HCC) 04/07/2022:Left mastectomy: 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  T4 vertebral body Bone scan 04/25/2022: Abnormal uptake in the sternum, right parietal region of calvarium, right posterior seventh rib, T5, left sternoclavicular joint   Treatment: Palliative chemotherapy with Taxotere [name] and Perjeta every 3 weeks x1 cycle given 05/05/2022 (discontinued for severe toxicities and hospitalization)   ---------------------------------------------------------------------------------------------------------------------------------------------- Chemotoxicities: Hospitalization 05/11/2022-05/18/2022: Convulsive syncope, chemo induced neutropenia, oral mucositis, chronic CHF, severe diarrhea with dehydration ECHO  06/26/22: EF 55-60% (follows with [facility])   Treatment plan:05/26/22 [name]. added Perjeta 06/21/22.   07/30/2022: Bone scan: Interval resolution/decreasing accumulation of multiple bone metastases 10/30/2022: CT CAP: Mildly prominent mediastinal lymph nodes are similar, no pulmonary metastasis, skeletal lesions increasing sclerosis, no visceral metastases 01/16/2023: CT CAP: Stable prominent mediastinal lymph nodes, slight increase in the small nodes in the mesentery could be reactive, scattered bone metastases stable 05/16/2023: CT CAP: Stable bone metastases, unchanged pretracheal lymph nodes.  Previous mesenteric lymph nodes not conspicuous.  Diffuse colonic mucosal thickening: Colitis   Recommendation: Referral to gastroenterology, I will discontinue Perjeta based on excellent scans in the presence of colitis. Do not wait for the lab results to proceed with her treatment.    Orders Placed This Encounter  Procedures   Ambulatory referral to Gastroenterology    Referral Priority:   Routine    Referral Type:   Consultation    Referral Reason:   Specialty Services Required    Referred to Provider:   Imogene Burn, MD    Number of Visits Requested:   1   The patient has a good understanding of the overall plan. she agrees with it. she will call with any problems that may develop before the next visit here. Total time spent: 30 mins including face to face time and time spent for planning, charting and co-ordination of care   Tamsen Meek, MD 05/21/23    I Janan Ridge am acting as a Neurosurgeon for [employer]  I have reviewed the above documentation for accuracy and completeness, and I agree with the above.

## 2023-05-21 NOTE — Assessment & Plan Note (Signed)
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  T4 vertebral body Bone scan 04/25/2022: Abnormal uptake in the sternum, right parietal region of calvarium, right posterior seventh rib, T5, left sternoclavicular joint   Treatment: Palliative chemotherapy with Taxotere [name] and Perjeta every 3 weeks x1 cycle given 05/05/2022 (discontinued for severe toxicities and hospitalization)   ---------------------------------------------------------------------------------------------------------------------------------------------- Chemotoxicities: Hospitalization 05/11/2022-05/18/2022: Convulsive syncope, chemo induced neutropenia, oral mucositis, chronic CHF, severe diarrhea with dehydration ECHO 06/26/22: EF 55-60% (follows with Bensimhon)   Treatment plan:05/26/22 [name]. added Perjeta 06/21/22.   07/30/2022: Bone scan: Interval resolution/decreasing accumulation of multiple bone metastases 10/30/2022: CT CAP: Mildly prominent mediastinal lymph nodes are similar, no pulmonary metastasis, skeletal lesions increasing sclerosis, no visceral metastases 01/16/2023: CT CAP: Stable prominent mediastinal lymph nodes, slight increase in the small nodes in the mesentery could be reactive, scattered bone metastases stable 05/16/2023: CT CAP: Stable bone metastases, unchanged pretracheal lymph nodes.  Previous mesenteric lymph nodes not conspicuous.  Diffuse colonic mucosal thickening: Colitis   Recommendation: Referral to gastroenterology Do not wait for the lab results to proceed with her treatment.

## 2023-05-21 NOTE — Patient Instructions (Addendum)
[facility]   Discharge Instructions: Thank you for choosing [facility] to provide your oncology and hematology care.   If you have a lab appointment with the Cancer Center, please go directly to the Cancer Center and check in at the registration area.   Wear comfortable clothing and clothing appropriate for easy access to any Portacath or PICC line.   We strive to give you quality time with your provider. You may need to reschedule your appointment if you arrive late (15 or more minutes).  Arriving late affects you and other patients whose appointments are after yours.  Also, if you miss three or more appointments without notifying the office, you may be dismissed from the clinic at the provider's discretion.      For prescription refill requests, have your pharmacy contact our office and allow 72 hours for refills to be completed.    Today you received the following chemotherapy and/or immunotherapy agents: Trastuzumab ([name])      To help prevent nausea and vomiting after your treatment, we encourage you to take your nausea medication as directed.  BELOW ARE SYMPTOMS THAT SHOULD BE REPORTED IMMEDIATELY: *FEVER GREATER THAN 100.4 F (38 C) OR HIGHER *CHILLS OR SWEATING *NAUSEA AND VOMITING THAT IS NOT CONTROLLED WITH YOUR NAUSEA MEDICATION *UNUSUAL SHORTNESS OF BREATH *UNUSUAL BRUISING OR BLEEDING *URINARY PROBLEMS (pain or burning when urinating, or frequent urination) *BOWEL PROBLEMS (unusual diarrhea, constipation, pain near the anus) TENDERNESS IN MOUTH AND THROAT WITH OR WITHOUT PRESENCE OF ULCERS (sore throat, sores in mouth, or a toothache) UNUSUAL RASH, SWELLING OR PAIN  UNUSUAL VAGINAL DISCHARGE OR ITCHING   Items with * indicate a potential emergency and should be followed up as soon as possible or go to the Emergency Department if any problems should occur.  Please show the CHEMOTHERAPY ALERT CARD or IMMUNOTHERAPY  ALERT CARD at check-in to the Emergency Department and triage nurse.  Should you have questions after your visit or need to cancel or reschedule your appointment, please contact [facility]  Dept: [phone]  and follow the prompts.  Office hours are 8:00 a.m. to 4:30 p.m. Monday - Friday. Please note that voicemails left after 4:00 p.m. may not be returned until the following business day.  We are closed weekends and major holidays. You have access to a nurse at all times for urgent questions. Please call the main number to the clinic Dept: [phone] and follow the prompts.   For any non-urgent questions, you may also contact your provider using MyChart. We now offer e-Visits for anyone 18 and older to request care online for non-urgent symptoms. For details visit [email or web address].   Also download the MyChart app! Go to the app store, search "MyChart", open the app, select [place], and log in with your MyChart username and password.   

## 2023-05-29 ENCOUNTER — Ambulatory Visit (HOSPITAL_COMMUNITY)
Admission: RE | Admit: 2023-05-29 | Discharge: 2023-05-29 | Disposition: A | Discharge status: Home / Self Care | Payer: Medicare Other | Source: Ambulatory Visit | Attending: Internal Medicine | Admitting: Internal Medicine

## 2023-05-29 ENCOUNTER — Encounter (HOSPITAL_COMMUNITY): Payer: Self-pay | Admitting: Internal Medicine

## 2023-05-29 ENCOUNTER — Ambulatory Visit (HOSPITAL_COMMUNITY)
Admission: RE | Admit: 2023-05-29 | Discharge: 2023-05-29 | Disposition: A | Discharge status: Home / Self Care | Payer: Medicare Other | Source: Ambulatory Visit | Attending: Family Medicine | Admitting: Family Medicine

## 2023-05-29 VITALS — BP 130/80 | HR 70 | Wt 145.4 lb

## 2023-05-29 DIAGNOSIS — C50412 Malignant neoplasm of upper-outer quadrant of left female breast: Secondary | ICD-10-CM

## 2023-05-29 DIAGNOSIS — I5032 Chronic diastolic (congestive) heart failure: Secondary | ICD-10-CM | POA: Diagnosis not present

## 2023-05-29 DIAGNOSIS — Z171 Estrogen receptor negative status [ER-]: Secondary | ICD-10-CM

## 2023-05-29 DIAGNOSIS — I110 Hypertensive heart disease with heart failure: Secondary | ICD-10-CM | POA: Diagnosis not present

## 2023-05-29 DIAGNOSIS — C50212 Malignant neoplasm of upper-inner quadrant of left female breast: Secondary | ICD-10-CM

## 2023-05-29 DIAGNOSIS — I340 Nonrheumatic mitral (valve) insufficiency: Secondary | ICD-10-CM | POA: Insufficient documentation

## 2023-05-29 DIAGNOSIS — E785 Hyperlipidemia, unspecified: Secondary | ICD-10-CM | POA: Diagnosis not present

## 2023-05-29 DIAGNOSIS — I10 Essential (primary) hypertension: Secondary | ICD-10-CM | POA: Diagnosis not present

## 2023-05-29 LAB — ECHOCARDIOGRAM COMPLETE
Area-P 1/2: 3.17 cm2
Calc EF: 48.3 %
MV M vel: 5.63 m/s
MV Peak grad: 126.8 mmHg
P 1/2 time: 678 msec
Radius: 0.2 cm
S' Lateral: 3.1 cm
Single Plane A2C EF: 51.3 %
Single Plane A4C EF: 49.5 %

## 2023-05-29 NOTE — Patient Instructions (Addendum)
Please call us regarding your Metprolol XL dose  Your physician recommends that you schedule a follow-up appointment in: 4 months with an echocardiogram  At the [facility], you and your health needs are our priority. As part of our continuing mission to provide you with exceptional heart care, we have created designated Provider Care Teams. These Care Teams include your primary Cardiologist (physician) and Advanced Practice Providers (APPs- Physician Assistants and Nurse Practitioners) who all work together to provide you with the care you need, when you need it.   You may see any of the following providers on your designated Care Team at your next follow up: Dr Arvilla Meres Dr Marca Ancona Dr. Marcos Eke, NP Robbie Lis, [place] [facility] [place], [place] Brynda Peon, NP Karle Plumber, PharmD   Please be sure to bring in all your medications bottles to every appointment.    Thank you for choosing [facility]   If you have any questions or concerns before your next appointment please send [place] a message through [place] or call our office at [phone].    TO LEAVE A MESSAGE FOR THE NURSE SELECT OPTION 2, PLEASE LEAVE A MESSAGE INCLUDING: YOUR NAME DATE OF BIRTH CALL BACK NUMBER REASON FOR CALL**this is important as we prioritize the call backs  YOU WILL RECEIVE A CALL BACK THE SAME DAY AS LONG AS YOU CALL BEFORE 4:00 PM

## 2023-05-29 NOTE — Progress Notes (Signed)
CARDIO-ONCOLOGY CLINIC NOTE  Referring Physician: Dr. Pamelia Hoit Primary Care: Lupita Raider, MD Primary Cardiologist: None   HPI:  Allison Pierce is a 77 y.o. female with HTN, HL, anxiety and left breast cancer referred by Dr. Pamelia Hoit for further evaluation of reduced EF on echocardiogram.   No previous cardiac history. Was diagnosed with left breast CA in 5/23. Stage 1A (ER/PR -, HER2+). Underwent left mastectomy 7/23.   Chemo: 8/23: Docetaxel + Trastuzumab + Pertuzumab (THP) q21d x 8 cycles / Trastuzumab + Pertuzumab q21d x 4 cycles  Maintenance: Trastuzumab IV (6) or SQ (600) D1 q21d X 11 Cycles   Echos: 03/01/22: EF 60-65% G2DD GLs -18.6% 06/26/22:  EF 55-60% GLS -14.1% 09/21/22 EF 60-65%   Echo 02/12/23 EF 60-65% Personally reviewed  Here for routine f/u with her husband. Remains on [name]. Perjeta stopped 05/21/23 due to oral ulcer.   Tolerating losartan and Toprol  Echo today 05/29/23 EF 60-65% Personally reviewed  Past Medical History:  Diagnosis Date   Anxiety    Depression    GERD (gastroesophageal reflux disease) OCCASIONALLY  TAKE TUMS   Human papilloma virus    Hyperlipidemia    Hypertension    left breast ca 12/2021   UTI (urinary tract infection)    Vulvar lesion     Current Outpatient Medications  Medication Sig Dispense Refill   B Complex-C (B COMPLEX-VITAMIN C) CAPS Take 1 capsule by mouth daily.     buPROPion (WELLBUTRIN XL) 300 MG 24 hr tablet Take 300 mg by mouth every morning.     cholecalciferol (VITAMIN D3) 25 MCG (1000 UNIT) tablet Take 1,000 Units by mouth daily.     clonazePAM (KLONOPIN) 1 MG tablet Take 1 mg by mouth at bedtime.     Cyanocobalamin (VITAMIN B-12) 5000 MCG TBDP Take 5,000 Units by mouth daily.     diphenhydrAMINE (BENADRYL) 25 MG tablet Take 25 mg by mouth in the morning.     fluticasone (FLONASE) 50 MCG/ACT nasal spray Place 1 spray into both nostrils daily as needed for allergies.     gabapentin (NEURONTIN) 100 MG capsule  Take 1 capsule (100 mg total) by mouth at bedtime. 14 capsule 0   lidocaine-prilocaine (EMLA) cream Apply 1 Application topically as needed. Apply one application daily as needed to port a cath site. 30 g 3   losartan (COZAAR) 100 MG tablet Take 1 tablet (100 mg total) by mouth daily. 90 tablet 3   meloxicam (MOBIC) 15 MG tablet Take 15 mg by mouth daily.     methylphenidate (RITALIN) 5 MG tablet Take 5 mg by mouth 2 (two) times daily as needed.     Metoprolol Succinate (TOPROL XL PO) Take by mouth. Per 02/15/23 note by Dr. Clarise Cruz, cardiologist; pt does not know dosage and this is not listed in med list     Potassium 99 MG TABS Take 99 mg by mouth at bedtime.     traZODone (DESYREL) 150 MG tablet Take 225 mg by mouth at bedtime.     venlafaxine XR (EFFEXOR-XR) 75 MG 24 hr capsule Take 75 mg by mouth daily with breakfast.     magic mouthwash (lidocaine, diphenhydrAMINE, alum & mag hydroxide) suspension Swish and spit 5 mLs by mouth 4 (four) times daily as needed for mouth pain. (Patient not taking: Reported on 05/29/2023) 240 mL 1   No current facility-administered medications for this encounter.    Allergies  Allergen Reactions   Oxybutynin Other (See Comments)    Unknown  per Pt    Prozac [Fluoxetine Hcl] Other (See Comments)    "Could not lift head up or get out of bed"    Ritalin [Methylphenidate] Other (See Comments)    Unknown per Pt    Rosuvastatin Other (See Comments)    Unknown per Pt    Lyrica [Pregabalin] Anxiety and Other (See Comments)    "Felt weird"      Social History   Socioeconomic History   Marital status: Married    Spouse name: Fayrene Fearing   Number of children: 1   Years of education: 12   Highest education level: Not on file  Occupational History   Not on file  Tobacco Use   Smoking status: Never   Smokeless tobacco: Never  Vaping Use   Vaping status: Never Used  Substance and Sexual Activity   Alcohol use: Yes    Alcohol/week: 7.0 standard drinks of  alcohol    Types: 7 Cans of beer per week   Drug use: Never   Sexual activity: Not Currently  Other Topics Concern   Not on file  Social History Narrative   Graduated HS      Right handed      Lives with husband   Social Determinants of Health   Financial Resource Strain: Low Risk  (02/22/2022)   Overall Financial Resource Strain (CARDIA)    Difficulty of Paying Living Expenses: Not hard at all  Food Insecurity: No Food Insecurity (02/22/2022)   Hunger Vital Sign    Worried About Running Out of Food in the Last Year: Never true    Ran Out of Food in the Last Year: Never true  Transportation Needs: No Transportation Needs (02/22/2022)   PRAPARE - Administrator, Civil Service (Medical): No    Lack of Transportation (Non-Medical): No  Physical Activity: Not on file  Stress: Not on file  Social Connections: Not on file  Intimate Partner Violence: Not on file      Family History  Problem Relation Age of Onset   Lung cancer Mother 91       she smoked   Prostate cancer Brother 62   Breast cancer Cousin        paternal first cousin   Breast cancer Cousin        paternal first cousin    Vitals:   05/29/23 1515  BP: 130/80  Pulse: 70  SpO2: 98%  Weight: 66 kg (145 lb 6.4 oz)     PHYSICAL EXAM: General:  Well appearing. No resp difficulty HEENT: normal Neck: supple. no JVD. Carotids 2+ bilat; no bruits. No lymphadenopathy or thryomegaly appreciated. Cor: PMI nondisplaced. Regular rate & rhythm. No rubs, gallops or murmurs. Lungs: clear Abdomen: soft, nontender, nondistended. No hepatosplenomegaly. No bruits or masses. Good bowel sounds. Extremities: no cyanosis, clubbing, rash, edema Neuro: alert & orientedx3, cranial nerves grossly intact. moves all 4 extremities w/o difficulty. Affect pleasant  ASSESSMENT & PLAN:  1. Abnormal echocardiogram, potential herceptin cardiotoxicity - Echo 03/01/22: EF 60-65% G2DD GLs -18.6% - Echo 06/26/22: EF 55-60% GLS  -14.1% - I have reviewed both her pre-chemo echocardiogram and her most recent echo side-by-side. Although EF on the most recent one may be slightly less vigorous than her pre-chemo echo it is still well within the normal range. Strain imaging is not ideal due to poor endocardial tracking on most recent echo but also suggests a possible slight decrease in contractile force - Although changes are minor it does fit  the time course for potential herceptin cardiotoxicity.  - Echo 09/21/22 EF 60-65% (completely normal)  - Echo 09/21/22 EF 60-65%  - Echo 02/12/23 EF 60-65% - Echo today 05/29/23 EF 60-65% Personally reviewed -> ok to continue [name] - Continue losartan and Toprol  - Continue echos q3 months with on [name]. After 1 year can switch surveillance to q4 months if EF is stable   2. Left Breast Cancer, stage IV with bony mets - diagnosed 5/23. Stage 1A (ER/PR -, HER2+).  - s/p left mastectomy 7/23.  - s/p 8/23: Docetaxel + Trastuzumab + Pertuzumab (THP) q21d x 8 cycles / Trastuzumab + Pertuzumab q21d x 4 cycles  - s/pTrastuzumab IV (6) or SQ (600) D1 q21d X 11 Cycles  - Continue [name]. Echo stable  3. Hypertension - Blood pressure well controlled. Continue current regimen.  Arvilla Meres, MD  3:30 PM

## 2023-06-01 ENCOUNTER — Telehealth: Payer: Self-pay

## 2023-06-01 NOTE — Telephone Encounter (Signed)
Pt called regarding scheduling a colonoscopy as it was requested by Dr. Pamelia Hoit. This RN let the pt know that the referral was sent over to Dr. Norwood Levo at [facility]. Informed pt that it may take a couple more business days before they call to schedule as the referral says "PEND" on our end. Phone number to the GI facility provided.  Pt verbalized thanks and understanding.

## 2023-06-12 ENCOUNTER — Inpatient Hospital Stay: Payer: Medicare Other | Attending: Hematology and Oncology

## 2023-06-12 ENCOUNTER — Inpatient Hospital Stay (HOSPITAL_BASED_OUTPATIENT_CLINIC_OR_DEPARTMENT_OTHER): Payer: Medicare Other | Admitting: Adult Health

## 2023-06-12 ENCOUNTER — Encounter: Payer: Self-pay | Admitting: Adult Health

## 2023-06-12 VITALS — BP 153/79 | HR 81 | Temp 98.6°F | Resp 18 | Ht 66.0 in | Wt 148.0 lb

## 2023-06-12 VITALS — BP 159/73 | HR 70 | Resp 18

## 2023-06-12 DIAGNOSIS — C7951 Secondary malignant neoplasm of bone: Secondary | ICD-10-CM | POA: Insufficient documentation

## 2023-06-12 DIAGNOSIS — Z79899 Other long term (current) drug therapy: Secondary | ICD-10-CM | POA: Diagnosis not present

## 2023-06-12 DIAGNOSIS — Z801 Family history of malignant neoplasm of trachea, bronchus and lung: Secondary | ICD-10-CM | POA: Diagnosis not present

## 2023-06-12 DIAGNOSIS — Z8042 Family history of malignant neoplasm of prostate: Secondary | ICD-10-CM | POA: Diagnosis not present

## 2023-06-12 DIAGNOSIS — I10 Essential (primary) hypertension: Secondary | ICD-10-CM | POA: Diagnosis not present

## 2023-06-12 DIAGNOSIS — Z803 Family history of malignant neoplasm of breast: Secondary | ICD-10-CM | POA: Insufficient documentation

## 2023-06-12 DIAGNOSIS — Z9012 Acquired absence of left breast and nipple: Secondary | ICD-10-CM | POA: Diagnosis not present

## 2023-06-12 DIAGNOSIS — Z171 Estrogen receptor negative status [ER-]: Secondary | ICD-10-CM

## 2023-06-12 DIAGNOSIS — Z5112 Encounter for antineoplastic immunotherapy: Secondary | ICD-10-CM | POA: Diagnosis not present

## 2023-06-12 DIAGNOSIS — C50412 Malignant neoplasm of upper-outer quadrant of left female breast: Secondary | ICD-10-CM

## 2023-06-12 DIAGNOSIS — K529 Noninfective gastroenteritis and colitis, unspecified: Secondary | ICD-10-CM | POA: Diagnosis not present

## 2023-06-12 MED ORDER — SODIUM CHLORIDE 0.9% FLUSH
10.0000 mL | INTRAVENOUS | Status: DC | PRN
  Administered 2023-06-12: 10 mL

## 2023-06-12 MED ORDER — DIPHENHYDRAMINE HCL 25 MG PO CAPS
25.0000 mg | ORAL_CAPSULE | Freq: Once | ORAL | Status: AC
  Administered 2023-06-12: 25 mg via ORAL
  Filled 2023-06-12: qty 1

## 2023-06-12 MED ORDER — HEPARIN SOD (PORK) LOCK FLUSH 100 UNIT/ML IV SOLN
500.0000 [IU] | Freq: Once | INTRAVENOUS | Status: AC | PRN
  Administered 2023-06-12: 500 [IU]

## 2023-06-12 MED ORDER — TRASTUZUMAB-DKST CHEMO 150 MG IV SOLR
6.0000 mg/kg | Freq: Once | INTRAVENOUS | Status: AC
  Administered 2023-06-12: 420 mg via INTRAVENOUS
  Filled 2023-06-12: qty 20

## 2023-06-12 MED ORDER — SODIUM CHLORIDE 0.9 % IV SOLN: Freq: Once | INTRAVENOUS | Status: AC

## 2023-06-12 MED ORDER — ACETAMINOPHEN 325 MG PO TABS
650.0000 mg | ORAL_TABLET | Freq: Once | ORAL | Status: AC
  Administered 2023-06-12: 650 mg via ORAL
  Filled 2023-06-12: qty 2

## 2023-06-12 NOTE — Patient Instructions (Addendum)
[facility]   Discharge Instructions: Thank you for choosing [facility] to provide your oncology and hematology care.   If you have a lab appointment with the Cancer Center, please go directly to the Cancer Center and check in at the registration area.   Wear comfortable clothing and clothing appropriate for easy access to any Portacath or PICC line.   We strive to give you quality time with your provider. You may need to reschedule your appointment if you arrive late (15 or more minutes).  Arriving late affects you and other patients whose appointments are after yours.  Also, if you miss three or more appointments without notifying the office, you may be dismissed from the clinic at the provider's discretion.      For prescription refill requests, have your pharmacy contact our office and allow 72 hours for refills to be completed.    Today you received the following chemotherapy and/or immunotherapy agent: trastuzumab      To help prevent nausea and vomiting after your treatment, we encourage you to take your nausea medication as directed.  BELOW ARE SYMPTOMS THAT SHOULD BE REPORTED IMMEDIATELY: *FEVER GREATER THAN 100.4 F (38 C) OR HIGHER *CHILLS OR SWEATING *NAUSEA AND VOMITING THAT IS NOT CONTROLLED WITH YOUR NAUSEA MEDICATION *UNUSUAL SHORTNESS OF BREATH *UNUSUAL BRUISING OR BLEEDING *URINARY PROBLEMS (pain or burning when urinating, or frequent urination) *BOWEL PROBLEMS (unusual diarrhea, constipation, pain near the anus) TENDERNESS IN MOUTH AND THROAT WITH OR WITHOUT PRESENCE OF ULCERS (sore throat, sores in mouth, or a toothache) UNUSUAL RASH, SWELLING OR PAIN  UNUSUAL VAGINAL DISCHARGE OR ITCHING   Items with * indicate a potential emergency and should be followed up as soon as possible or go to the Emergency Department if any problems should occur.  Please show the CHEMOTHERAPY ALERT CARD or IMMUNOTHERAPY ALERT CARD at  check-in to the Emergency Department and triage nurse.  Should you have questions after your visit or need to cancel or reschedule your appointment, please contact [facility]  Dept: [phone]  and follow the prompts.  Office hours are 8:00 a.m. to 4:30 p.m. Monday - Friday. Please note that voicemails left after 4:00 p.m. may not be returned until the following business day.  We are closed weekends and major holidays. You have access to a nurse at all times for urgent questions. Please call the main number to the clinic Dept: [phone] and follow the prompts.   For any non-urgent questions, you may also contact your provider using MyChart. We now offer e-Visits for anyone 74 and older to request care online for non-urgent symptoms. For details visit [email or web address].   Also download the MyChart app! Go to the app store, search "MyChart", open the app, select [place], and log in with your MyChart username and password.

## 2023-06-12 NOTE — Assessment & Plan Note (Addendum)
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  T4 vertebral body Bone scan 04/25/2022: Abnormal uptake in the sternum, right parietal region of calvarium, right posterior seventh rib, T5, left sternoclavicular joint   Treatment: Palliative chemotherapy with Taxotere [name] and Perjeta every 3 weeks x1 cycle given 05/05/2022 (discontinued for severe toxicities and hospitalization)  07/30/2022: Bone scan: Interval resolution/decreasing accumulation of multiple bone metastases 10/30/2022: CT CAP: Mildly prominent mediastinal lymph nodes are similar, no pulmonary metastasis, skeletal lesions increasing sclerosis, no visceral metastases 01/16/2023: CT CAP: Stable prominent mediastinal lymph nodes, slight increase in the small nodes in the mesentery could be reactive, scattered bone metastases stable 05/16/2023: CT CAP: Stable bone metastases, unchanged pretracheal lymph nodes.  Previous mesenteric lymph nodes not conspicuous.  Diffuse colonic mucosal thickening: Colitis   ---------------------------------------------------------------------------------------------------------------------------------------------- Chemotoxicities: Hospitalization 05/11/2022-05/18/2022: Convulsive syncope, chemo induced neutropenia, oral mucositis, chronic CHF, severe diarrhea with dehydration ECHO 05/29/2023: EF 60-65%% (follows with Bensimhon)   Treatment plan:05/26/22 [name]; Perjeta was added 06/21/22, then discontinued August 2024 due to colitis   Metastatic breast cancer: Has no clinical or radiographic signs of breast cancer progression.  Will proceed with [name] alone today.  Her most recent echocardiogram was stable and she continues to follow-up with  Dr. Gala Romney. Colitis and diarrhea: The diarrhea is improving with stopping the Perjeta.  A new patient appointment has not yet been scheduled with GI.  I reviewed the referral information that was placed on August 19 and followed up with [facility] leadership who is working on getting this appointment scheduled. Hypertension: Her blood pressure today is 160 systolic.  I suggested that she check her blood pressure at home if possible.  We will continue to monitor this since other locations such as cardiology her blood pressure has been at goal. Wynelle Link exposure recommendations: I recommended that she continue to wear wide brim hat use SPF 50 or greater and wear long sleeves and stay under an umbrella when she goes to the beach.  I recommended she avoid direct contact with the sun for long periods of time, especially during peak daylight hours.  RTC in 3 weeks for f/u with Dr. Pamelia Hoit and her next treatment.

## 2023-06-12 NOTE — Progress Notes (Signed)
[facility] Cancer Follow up:    Lupita Raider, MD [address] [place] [address]   DIAGNOSIS:  Cancer Staging  Malignant neoplasm of upper-outer quadrant of left breast in female, estrogen receptor negative (HCC) Staging form: Breast, AJCC 8th Edition - Clinical: Stage IA (cT1c, cN0, cM0, G2, ER-, PR-, HER2+) - Signed by Serena Croissant, MD on 02/22/2022 Stage prefix: Initial diagnosis Histologic grading system: 3 grade system - Pathologic: Stage IIIA (pT2, pN2, cM0, G2, ER-, PR-, HER2+) - Signed by Serena Croissant, MD on 04/27/2022 Histologic grading system: 3 grade system - Pathologic: Stage IV (cM1) - Signed by [name], NP on 06/12/2023   SUMMARY OF ONCOLOGIC HISTORY: Oncology History  Malignant neoplasm of upper-outer quadrant of left breast in female, estrogen receptor negative (HCC)  02/10/2022 Initial Diagnosis   Palpable left breast masses and calcifications: 1.8 cm at 1:00 and 1.7 cm at 9:00, calcifications not biopsy.  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 Report date is 03/05/2022.  The CustomNext-Cancer+RNAinsight panel offered by [facility] includes sequencing and rearrangement analysis for the following 48 genes:  APC, ATM, AXIN2, BARD1, BMPR1A, BRCA1, BRCA2, BRIP1, CDH1, CDK4, CDKN2A, CHEK2, CTNNA1, DICER1, EGFR, EPCAM, GREM1, HOXB13, KIT, MEN1, MLH1, MSH2, MSH3, MSH6, MUTYH, NBN, NF1, NTHL1, PALB2, PDGFRA, PMS2, POLD1, POLE, PTEN, RAD50, RAD51C, RAD51D, SDHA, SDHB, SDHC, SDHD, SMAD4, SMARCA4, STK11, TP53, TSC1, TSC2, and VHL.   RNA data is routinely analyzed for use in variant interpretation for all genes.   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, LCIS, 5/5 lymph nodes positive with extranodal extension, margins negative ER 0%, PR 0%, HER2 3+, Ki-67 20%   04/27/2022 Cancer Staging   Staging form: Breast, AJCC 8th Edition - Pathologic: Stage IIIA (pT2, pN2, cM0, G2, ER-, PR-, HER2+) - Signed by Serena Croissant, MD on 04/27/2022 Histologic grading system: 3 grade system   05/05/2022 - 05/26/2022 Chemotherapy   Patient is on Treatment Plan : BREAST DOCEtaxel + Trastuzumab + Pertuzumab (THP) q21d x 8 cycles / Trastuzumab + Pertuzumab q21d x 4 cycles     05/26/2022 -  Chemotherapy   Patient is on Treatment Plan : BREAST MAINTENANCE Trastuzumab IV (6) or SQ (600) D1 q21d X 11 Cycles     06/12/2023 Cancer Staging   Staging form: Breast, AJCC 8th Edition - Pathologic: Stage IV (cM1) - Signed by [name], NP on 06/12/2023     CURRENT THERAPY: [name]  INTERVAL HISTORY: Allison Pierce 77 y.o. female returns for follow-up of her metastatic breast cancer.  She continues on treatment with [name] given every 3 weeks.  Her most recent restaging scans occurred on May 15, 2023 demonstrating unchanged sclerotic metastases, unchanged prominent pretracheal lymph nodes, previous mesenteric lymph nodes not conspicuous, no evidence of soft tissue metastatic disease in the chest abdomen or pelvis.    Her most recent echocardiogram occurred on May 29, 2023 demonstrating a left ventricular ejection fraction of 60 to 65%.  She saw Dr. Gala Romney on May 26, 2023.  Her blood pressure  at that appointment was 130/80 and he noted she is taking losartan and Toprol.  Blood pressure today is elevated at 160 systolic.  She says that she does not have a working blood pressure monitor at home.  Her restaging CT chest abdomen pelvis on May 15, 2023 also demonstrated  diffuse colonic mucosal thickening, hyperenhancement, and vascular coming, particularly conspicuous in the distal colon consistent with nonspecific infectious or inflammatory colitis.  She also was noted to have sigmoid diverticulosis which was not the nidus of the inflammatory findings noted previously.  For this reason Dr. Pamelia Hoit discontinued her Perjeta and she was referred to GI for evaluation by Dr. Leonides Schanz.  Since stopping the Perjeta Jenae notes that her diarrhea is improved.  She notes her appetite is stable.  She is planning an upcoming trip to the beach and wants to know the recommendations for sun exposure.    Patient Active Problem List   Diagnosis Date Noted   Metastasis to bone (HCC) 06/12/2023   Shingles 05/15/2022   Colitis 05/13/2022   Alcohol abuse 05/12/2022   Gastroesophageal reflux disease 05/12/2022   Irritable bowel syndrome 05/12/2022   Mild cognitive impairment 05/12/2022   Urinary incontinence 05/12/2022   Allergic rhinitis 05/12/2022   Recurrent major depression in remission (HCC) 05/12/2022   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HCC) 05/12/2022   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   Acute colitis 05/12/2022   Lactic acidosis 05/12/2022   Chemotherapy induced neutropenia (HCC) 05/12/2022   Chronic diastolic CHF (congestive heart failure) (HCC) 05/12/2022   Oral mucositis 05/12/2022   Port-A-Cath in place 05/05/2022   Genetic testing 03/06/2022   Family history of breast cancer 02/23/2022   Family history of prostate cancer 02/23/2022   Essential hypertension 02/22/2022   Malignant neoplasm of upper-outer quadrant of left breast in female, estrogen receptor negative (HCC) 02/20/2022   Malignant neoplasm of upper-inner quadrant of left female breast (HCC) 02/20/2022   Right-sided epistaxis 07/19/2017   Anxiety state 01/24/2013   Arthropathy 01/24/2013   Depressive disorder, not elsewhere classified 01/24/2013   Mixed hyperlipidemia 01/24/2013    is allergic to  oxybutynin, prozac [fluoxetine hcl], ritalin [methylphenidate], rosuvastatin, and lyrica [pregabalin].  MEDICAL HISTORY: Past Medical History:  Diagnosis Date   Anxiety    Depression    GERD (gastroesophageal reflux disease) OCCASIONALLY  TAKE TUMS   Human papilloma virus    Hyperlipidemia    Hypertension    left breast ca 12/2021   UTI (urinary tract infection)    Vulvar lesion     SURGICAL HISTORY: Past Surgical History:  Procedure Laterality Date   ABDOMINAL HYSTERECTOMY  1992   W/ SALPINGO-OOPHORECTOMY   ANTERIOR CERVICAL DECOMP/DISCECTOMY FUSION  01-18-2009  DR POOLE   C4  - C6   AUGMENTATION MAMMAPLASTY Bilateral    BREAST BIOPSY Right    BREAST BIOPSY Left 02/10/2022   x2   BREAST ENHANCEMENT SURGERY  2011   BREAST EXCISIONAL BIOPSY Left    BREAST RECONSTRUCTION WITH PLACEMENT OF TISSUE EXPANDER AND FLEX HD (ACELLULAR HYDRATED DERMIS) Left 04/07/2022   Procedure: BREAST RECONSTRUCTION WITH PLACEMENT OF TISSUE EXPANDER AND FLEX HD (ACELLULAR HYDRATED DERMIS);  Surgeon: Allena Napoleon, MD;  Location: [facility] OR;  Service: Plastics;  Laterality: Left;   BREAST SURGERY  02-04-2011  dr Jamey Ripa   EXCISION LEFT BREAST MASS--  CALCIFICATION   EYE SURGERY Bilateral    cataract removal   MASTECTOMY W/ SENTINEL NODE BIOPSY Left 04/07/2022   Procedure: LEFT MASTECTOMY WITH SENTINEL NODE BIOPSY;  Surgeon: Abigail Miyamoto, MD;  Location: [facility] OR;  Service: General;  Laterality: Left;  LMA   PORTACATH PLACEMENT N/A 04/07/2022   Procedure: PORT-A-CATH INSERTION WITH ULTRASOUND GUIDANCE;  Surgeon: Abigail Miyamoto, MD;  Location: [facility] OR;  Service: General;  Laterality: N/A;   REMOVAL OF TISSUE EXPANDER AND PLACEMENT OF IMPLANT Left 03/16/2023   Procedure: Removal of left breast tissue expander and placement of permanent implant;  Surgeon: [name], MD;  Location: [facility];  Service: Plastics;  Laterality: Left;   VULVAR LESION REMOVAL  09/10/2012   Procedure: VULVAR  LESION;  Surgeon: Gretta Cool, MD;  Location: [facility];  Service: Gynecology;  Laterality: N/A;  WIDE EXCISION OF VULVAR LESION    SOCIAL HISTORY: Social History   Socioeconomic History   Marital status: Married    Spouse name: Fayrene Fearing   Number of children: 1   Years of education: 12   Highest education level: Not on file  Occupational History   Not on file  Tobacco Use   Smoking status: Never   Smokeless tobacco: Never  Vaping Use   Vaping status: Never Used  Substance and Sexual Activity   Alcohol use: Yes    Alcohol/week: 7.0 standard drinks of alcohol    Types: 7 Cans of beer per week   Drug use: Never   Sexual activity: Not Currently  Other Topics Concern   Not on file  Social History Narrative   Graduated HS      Right handed      Lives with husband   Social Determinants of Health   Financial Resource Strain: Low Risk  (02/22/2022)   Overall Financial Resource Strain (CARDIA)    Difficulty of Paying Living Expenses: Not hard at all  Food Insecurity: No Food Insecurity (02/22/2022)   Hunger Vital Sign    Worried About Running Out of Food in the Last Year: Never true    Ran Out of Food in the Last Year: Never true  Transportation Needs: No Transportation Needs (02/22/2022)   PRAPARE - Administrator, Civil Service (Medical): No    Lack of Transportation (Non-Medical): No  Physical Activity: Not on file  Stress: Not on file  Social Connections: Not on file  Intimate Partner Violence: Not on file    FAMILY HISTORY: Family History  Problem Relation Age of Onset   Lung cancer Mother 3       she smoked   Prostate cancer Brother 18   Breast cancer Cousin        paternal first cousin   Breast cancer Cousin        paternal first cousin    Review of Systems  Constitutional:  Negative for appetite change, chills, fatigue, fever and unexpected weight change.  HENT:   Negative for hearing loss, lump/mass and trouble swallowing.    Eyes:  Negative for eye problems and icterus.  Respiratory:  Negative for chest tightness, cough and shortness of breath.   Cardiovascular:  Negative for chest pain, leg swelling and palpitations.  Gastrointestinal:  Positive for diarrhea. Negative for abdominal distention, abdominal pain, constipation, nausea and vomiting.  Endocrine: Negative for hot flashes.  Genitourinary:  Negative for difficulty urinating.   Musculoskeletal:  Negative for arthralgias.  Skin:  Negative for itching and rash.  Neurological:  Negative for dizziness, extremity weakness, headaches and numbness.  Hematological:  Negative for adenopathy. Does not bruise/bleed easily.  Psychiatric/Behavioral:  Negative for depression. The patient is  not nervous/anxious.       PHYSICAL EXAMINATION    Vitals:   06/12/23 1055  BP: (!) 153/79  Pulse: 81  Resp: 18  Temp: 98.6 F (37 C)  SpO2: 98%    Physical Exam Constitutional:      General: She is not in acute distress.    Appearance: Normal appearance. She is not toxic-appearing.  HENT:     Head: Normocephalic and atraumatic.     Mouth/Throat:     Mouth: Mucous membranes are moist.     Pharynx: Oropharynx is clear. No oropharyngeal exudate or posterior oropharyngeal erythema.  Eyes:     General: No scleral icterus. Cardiovascular:     Rate and Rhythm: Normal rate and regular rhythm.     Pulses: Normal pulses.     Heart sounds: Normal heart sounds.  Pulmonary:     Effort: Pulmonary effort is normal.     Breath sounds: Normal breath sounds.  Abdominal:     General: Abdomen is flat. Bowel sounds are normal. There is no distension.     Palpations: Abdomen is soft.     Tenderness: There is no abdominal tenderness.  Musculoskeletal:        General: No swelling.     Cervical back: Neck supple.  Lymphadenopathy:     Cervical: No cervical adenopathy.  Skin:    General: Skin is warm and dry.     Findings: No rash.  Neurological:     General: No focal  deficit present.     Mental Status: She is alert.  Psychiatric:        Mood and Affect: Mood normal.        Behavior: Behavior normal.      ASSESSMENT and THERAPY PLAN:   Malignant neoplasm of upper-outer quadrant of left breast in female, estrogen receptor negative (HCC) 04/07/2022:Left mastectomy: 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  T4 vertebral body Bone scan 04/25/2022: Abnormal uptake in the sternum, right parietal region of calvarium, right posterior seventh rib, T5, left sternoclavicular joint   Treatment: Palliative chemotherapy with Taxotere [name] and Perjeta every 3 weeks x1 cycle given 05/05/2022 (discontinued for severe toxicities and hospitalization)  07/30/2022: Bone scan: Interval resolution/decreasing accumulation of multiple bone metastases 10/30/2022: CT CAP: Mildly prominent mediastinal lymph nodes are similar, no pulmonary metastasis, skeletal lesions increasing sclerosis, no visceral metastases 01/16/2023: CT CAP: Stable prominent mediastinal lymph nodes, slight increase in the small nodes in the mesentery could be reactive, scattered bone metastases stable 05/16/2023: CT CAP: Stable bone metastases, unchanged pretracheal lymph nodes.  Previous mesenteric lymph nodes not conspicuous.  Diffuse colonic mucosal thickening: Colitis   ---------------------------------------------------------------------------------------------------------------------------------------------- Chemotoxicities: Hospitalization 05/11/2022-05/18/2022: Convulsive syncope, chemo induced neutropenia, oral mucositis, chronic CHF, severe diarrhea with dehydration ECHO 05/29/2023: EF 60-65%% (follows with [facility])   Treatment plan:05/26/22 [name]; Perjeta  was added 06/21/22, then discontinued August 2024 due to colitis   Metastatic breast cancer: Has no clinical or radiographic signs of breast cancer progression.  Will proceed with [name] alone today.  Her most recent echocardiogram was stable and she continues to follow-up with Dr. Gala Romney. Colitis and diarrhea: The diarrhea is improving with stopping the Perjeta.  A new patient appointment has not yet been scheduled with GI.  I reviewed the referral information that was  placed on August 19 and followed up with [facility] leadership who is working on getting this appointment scheduled. Hypertension: Her blood pressure today is 160 systolic.  I suggested that she check her blood pressure at home if possible.  We will continue to monitor this since other locations such as cardiology her blood pressure has been at goal. Allison Pierce exposure recommendations: I recommended that she continue to wear wide brim hat use SPF 50 or greater and wear long sleeves and stay under an umbrella when she goes to the beach.  I recommended she avoid direct contact with the sun for long periods of time, especially during peak daylight hours.  RTC in 3 weeks for f/u with Dr. Pamelia Hoit and her next treatment.   All questions were answered. The patient knows to call the clinic with any problems, questions or concerns. We can certainly see the patient much sooner if necessary.  Total encounter time:30 minutes*in face-to-face visit time, chart review, lab review, care coordination, order entry, and documentation of the encounter time.    Lillard Anes, NP 06/12/23 11:04 AM Medical Oncology and Hematology [facility] [address] [place], [place] [address] Tel. [phone]    Fax. [phone]  *Total Encounter Time as defined by the Centers for Medicare and Medicaid Services includes, in addition to the face-to-face time of a patient visit (documented in the note above) non-face-to-face time: obtaining and  reviewing outside history, ordering and reviewing medications, tests or procedures, care coordination (communications with other health care professionals or caregivers) and documentation in the medical record.

## 2023-06-25 ENCOUNTER — Other Ambulatory Visit (INDEPENDENT_AMBULATORY_CARE_PROVIDER_SITE_OTHER): Payer: Medicare Other

## 2023-06-25 ENCOUNTER — Ambulatory Visit (INDEPENDENT_AMBULATORY_CARE_PROVIDER_SITE_OTHER): Payer: Medicare Other | Admitting: Gastroenterology

## 2023-06-25 ENCOUNTER — Encounter: Payer: Self-pay | Admitting: Gastroenterology

## 2023-06-25 VITALS — BP 140/80 | HR 76 | Ht 66.0 in | Wt 151.0 lb

## 2023-06-25 DIAGNOSIS — D509 Iron deficiency anemia, unspecified: Secondary | ICD-10-CM

## 2023-06-25 DIAGNOSIS — K529 Noninfective gastroenteritis and colitis, unspecified: Secondary | ICD-10-CM

## 2023-06-25 DIAGNOSIS — R9389 Abnormal findings on diagnostic imaging of other specified body structures: Secondary | ICD-10-CM | POA: Diagnosis not present

## 2023-06-25 DIAGNOSIS — R197 Diarrhea, unspecified: Secondary | ICD-10-CM

## 2023-06-25 LAB — CBC WITH DIFFERENTIAL/PLATELET
Basophils Absolute: 0 10*3/uL (ref 0.0–0.1)
Basophils Relative: 0.7 % (ref 0.0–3.0)
Eosinophils Absolute: 0.2 10*3/uL (ref 0.0–0.7)
Eosinophils Relative: 3.2 % (ref 0.0–5.0)
HCT: 35.5 % — ABNORMAL LOW (ref 36.0–46.0)
Hemoglobin: 11.4 g/dL — ABNORMAL LOW (ref 12.0–15.0)
Lymphocytes Relative: 16.7 % (ref 12.0–46.0)
Lymphs Abs: 1.1 10*3/uL (ref 0.7–4.0)
MCHC: 32.1 g/dL (ref 30.0–36.0)
MCV: 84.6 fl (ref 78.0–100.0)
Monocytes Absolute: 0.5 10*3/uL (ref 0.1–1.0)
Monocytes Relative: 8.5 % (ref 3.0–12.0)
Neutro Abs: 4.5 10*3/uL (ref 1.4–7.7)
Neutrophils Relative %: 70.9 % (ref 43.0–77.0)
Platelets: 280 10*3/uL (ref 150.0–400.0)
RBC: 4.19 Mil/uL (ref 3.87–5.11)
RDW: 14.6 % (ref 11.5–15.5)
WBC: 6.4 10*3/uL (ref 4.0–10.5)

## 2023-06-25 LAB — FOLATE: Folate: 15.2 ng/mL (ref >5.9)

## 2023-06-25 LAB — COMPREHENSIVE METABOLIC PANEL
ALT: 16 U/L (ref 0–35)
AST: 19 U/L (ref 0–37)
Albumin: 4 g/dL (ref 3.5–5.2)
Alkaline Phosphatase: 76 U/L (ref 39–117)
BUN: 8 mg/dL (ref 6–23)
CO2: 30 mEq/L (ref 19–32)
Calcium: 9.1 mg/dL (ref 8.4–10.5)
Chloride: 104 mEq/L (ref 96–112)
Creatinine, Ser: 1.02 mg/dL (ref 0.40–1.20)
GFR: 52.76 mL/min — ABNORMAL LOW (ref >60.00)
Glucose, Bld: 93 mg/dL (ref 70–99)
Potassium: 3.7 mEq/L (ref 3.5–5.1)
Sodium: 140 mEq/L (ref 135–145)
Total Bilirubin: 0.7 mg/dL (ref 0.2–1.2)
Total Protein: 6.3 g/dL (ref 6.0–8.3)

## 2023-06-25 LAB — VITAMIN B12: Vitamin B-12: >1501 pg/mL — ABNORMAL HIGH (ref 211–911)

## 2023-06-25 LAB — IBC + FERRITIN
Ferritin: 15.6 ng/mL (ref 10.0–291.0)
Iron: 91 ug/dL (ref 42–145)
Saturation Ratios: 20.5 % (ref 20.0–50.0)
TIBC: 443.8 ug/dL (ref 250.0–450.0)
Transferrin: 317 mg/dL (ref 212.0–360.0)

## 2023-06-25 MED ORDER — NA SULFATE-K SULFATE-MG SULF 17.5-3.13-1.6 GM/177ML PO SOLN: ORAL | 0 refills | Status: DC

## 2023-06-25 NOTE — Progress Notes (Signed)
Chief Complaint: Colitis Primary GI MD: unassigned  HPI: 77 year old female history of breast cancer undergoing chemotherapy presents for evaluation of colitis.  Follows with Dr. Pamelia Hoit, oncology.  Patient is a previous patient of Dr. Dulce Sellar.  No available records to review.  Reports remote colonoscopy sometime within the last 10 years but unsure what it showed.  States she has struggled with diarrhea the majority of her life but notes it has gotten progressively worse since starting chemotherapy about 1 year ago.  States she will have 3-4 runny bowel movements per day.  Denies melena/hematochezia.  Has had 2 significant accidents within the last year in which she had full incontinence of feces.  Reports increased urgency.  Denies abdominal pain though states if her stomach is palpated she will have some discomfort.  CT chest abdomen pelvis with contrast 05/15/2023 showed sigmoid diverticulosis.  Diffuse colonic mucosal thickening, hyperenhancement, and vascular combing particularly in the distal colon.  CT chest abdomen pelvis with contrast 01/12/2023 shows subtle wall thickening along the sigmoid colon with some vascular engorgement concerning for subtle colitis.  Of note, appears patient also has chronic microcytic anemia ongoing since August 2023. Most recent lab work 05/10/2023 show Hgb 10.8, MCV 81.9.  Baseline Hgb is around 10.  Denies overt bleeding.  Has not taken iron supplement/iron infusion.  Echocardiogram 05/2023 with adequate ejection fraction   Past Medical History:  Diagnosis Date   Anxiety    Depression    GERD (gastroesophageal reflux disease) OCCASIONALLY  TAKE TUMS   Human papilloma virus    Hyperlipidemia    Hypertension    left breast ca 12/2021   UC (ulcerative colitis) (HCC)    UTI (urinary tract infection)    Vulvar lesion     Past Surgical History:  Procedure Laterality Date   ABDOMINAL HYSTERECTOMY  1992   W/ SALPINGO-OOPHORECTOMY   ANTERIOR CERVICAL  DECOMP/DISCECTOMY FUSION  01-18-2009  DR POOLE   C4  - C6   AUGMENTATION MAMMAPLASTY Bilateral    BREAST BIOPSY Right    BREAST BIOPSY Left 02/10/2022   x2   BREAST ENHANCEMENT SURGERY  2011   BREAST EXCISIONAL BIOPSY Left    BREAST RECONSTRUCTION WITH PLACEMENT OF TISSUE EXPANDER AND FLEX HD (ACELLULAR HYDRATED DERMIS) Left 04/07/2022   Procedure: BREAST RECONSTRUCTION WITH PLACEMENT OF TISSUE EXPANDER AND FLEX HD (ACELLULAR HYDRATED DERMIS);  Surgeon: Allena Napoleon, MD;  Location: [facility] OR;  Service: Plastics;  Laterality: Left;   BREAST SURGERY  02-04-2011  dr Jamey Ripa   EXCISION LEFT BREAST MASS--  CALCIFICATION   EYE SURGERY Bilateral    cataract removal   MASTECTOMY W/ SENTINEL NODE BIOPSY Left 04/07/2022   Procedure: LEFT MASTECTOMY WITH SENTINEL NODE BIOPSY;  Surgeon: Abigail Miyamoto, MD;  Location: [facility] OR;  Service: General;  Laterality: Left;  LMA   PORTACATH PLACEMENT N/A 04/07/2022   Procedure: PORT-A-CATH INSERTION WITH ULTRASOUND GUIDANCE;  Surgeon: Abigail Miyamoto, MD;  Location: [facility] OR;  Service: General;  Laterality: N/A;   REMOVAL OF TISSUE EXPANDER AND PLACEMENT OF IMPLANT Left 03/16/2023   Procedure: Removal of left breast tissue expander and placement of permanent implant;  Surgeon: [name], MD;  Location: [facility];  Service: Plastics;  Laterality: Left;   VULVAR LESION REMOVAL  09/10/2012   Procedure: VULVAR LESION;  Surgeon: Gretta Cool, MD;  Location: [facility];  Service: Gynecology;  Laterality: N/A;  WIDE EXCISION OF VULVAR LESION    Current Outpatient Medications  Medication  Sig Dispense Refill   B Complex-C (B COMPLEX-VITAMIN C) CAPS Take 1 capsule by mouth daily.     buPROPion (WELLBUTRIN XL) 300 MG 24 hr tablet Take 300 mg by mouth every morning.     cholecalciferol (VITAMIN D3) 25 MCG (1000 UNIT) tablet Take 1,000 Units by mouth daily.     clonazePAM (KLONOPIN) 1 MG tablet Take 1 mg by mouth at bedtime.      Cyanocobalamin (VITAMIN B-12) 5000 MCG TBDP Take 5,000 Units by mouth daily.     diphenhydrAMINE (BENADRYL) 25 MG tablet Take 25 mg by mouth in the morning.     fluticasone (FLONASE) 50 MCG/ACT nasal spray Place 1 spray into both nostrils daily as needed for allergies.     gabapentin (NEURONTIN) 100 MG capsule Take 1 capsule (100 mg total) by mouth at bedtime. 14 capsule 0   lidocaine-prilocaine (EMLA) cream Apply 1 Application topically as needed. Apply one application daily as needed to port a cath site. 30 g 3   losartan (COZAAR) 100 MG tablet Take 1 tablet (100 mg total) by mouth daily. 90 tablet 3   magic mouthwash (lidocaine, diphenhydrAMINE, alum & mag hydroxide) suspension Swish and spit 5 mLs by mouth 4 (four) times daily as needed for mouth pain. 240 mL 1   meloxicam (MOBIC) 15 MG tablet Take 15 mg by mouth daily.     methylphenidate (RITALIN) 5 MG tablet Take 5 mg by mouth 2 (two) times daily as needed.     Metoprolol Succinate (TOPROL XL PO) Take by mouth. Per 02/15/23 note by Dr. Clarise Cruz, cardiologist; pt does not know dosage and this is not listed in med list     Potassium 99 MG TABS Take 99 mg by mouth at bedtime.     traZODone (DESYREL) 150 MG tablet Take 225 mg by mouth at bedtime.     venlafaxine XR (EFFEXOR-XR) 75 MG 24 hr capsule Take 75 mg by mouth daily with breakfast.     No current facility-administered medications for this visit.    Allergies as of 06/25/2023 - Review Complete 06/25/2023  Allergen Reaction Noted   Oxybutynin Other (See Comments) 03/08/2022   Prozac [fluoxetine hcl] Other (See Comments) 09/05/2012   Ritalin [methylphenidate] Other (See Comments) 03/08/2022   Rosuvastatin Other (See Comments) 02/14/2022   Lyrica [pregabalin] Anxiety and Other (See Comments) 09/05/2012    Family History  Problem Relation Age of Onset   Lung cancer Mother 18       she smoked   Prostate cancer Brother 35   Breast cancer Cousin        paternal first cousin    Breast cancer Cousin        paternal first cousin   Liver disease Neg Hx    Esophageal cancer Neg Hx    Colon cancer Neg Hx     Social History   Socioeconomic History   Marital status: Married    Spouse name: Fayrene Fearing   Number of children: 1   Years of education: 12   Highest education level: Not on file  Occupational History   Not on file  Tobacco Use   Smoking status: Never   Smokeless tobacco: Never  Vaping Use   Vaping status: Never Used  Substance and Sexual Activity   Alcohol use: Yes    Alcohol/week: 7.0 standard drinks of alcohol    Types: 7 Cans of beer per week   Drug use: Never   Sexual activity: Not Currently  Other Topics  Concern   Not on file  Social History Narrative   Graduated HS      Right handed      Lives with husband   Social Determinants of Health   Financial Resource Strain: Low Risk  (02/22/2022)   Overall Financial Resource Strain (CARDIA)    Difficulty of Paying Living Expenses: Not hard at all  Food Insecurity: No Food Insecurity (02/22/2022)   Hunger Vital Sign    Worried About Running Out of Food in the Last Year: Never true    Ran Out of Food in the Last Year: Never true  Transportation Needs: No Transportation Needs (02/22/2022)   PRAPARE - Administrator, Civil Service (Medical): No    Lack of Transportation (Non-Medical): No  Physical Activity: Not on file  Stress: Not on file  Social Connections: Not on file  Intimate Partner Violence: Not on file    Review of Systems:    Constitutional: No weight loss, fever, chills, weakness or fatigue HEENT: Eyes: No change in vision               Ears, Nose, Throat:  No change in hearing or congestion Skin: No rash or itching Cardiovascular: No chest pain, chest pressure or palpitations   Respiratory: No SOB or cough Gastrointestinal: See HPI and otherwise negative Genitourinary: No dysuria or change in urinary frequency Neurological: No headache, dizziness or  syncope Musculoskeletal: No new muscle or joint pain Hematologic: No bleeding or bruising Psychiatric: No history of depression or anxiety    Physical Exam:  Vital signs: Ht 5\' 6"  (1.676 m)   Wt 68.5 kg   BMI 24.37 kg/m   Constitutional: NAD, Well developed, Well nourished, alert and cooperative.  Appears significantly younger than stated age and in adequate health. Head:  Normocephalic and atraumatic. Eyes:   PEERL, EOMI. No icterus. Conjunctiva pink. Respiratory: Respirations even and unlabored. Lungs clear to auscultation bilaterally.   No wheezes, crackles, or rhonchi.  Cardiovascular:  Regular rate and rhythm. No peripheral edema, cyanosis or pallor.  Gastrointestinal:  Soft, nondistended, nontender. No rebound or guarding. Normal bowel sounds. No appreciable masses or hepatomegaly. Rectal:  Not performed.  Msk:  Symmetrical without gross deformities. Without edema, no deformity or joint abnormality.  Neurologic:  Alert and  oriented x4;  grossly normal neurologically.  Skin:   Dry and intact without significant lesions or rashes. Psychiatric: Oriented to person, place and time. Demonstrates good judgement and reason without abnormal affect or behaviors.   RELEVANT LABS AND IMAGING: CBC    Component Value Date/Time   WBC 5.3 05/10/2023 1015   WBC 5.8 06/05/2022 1021   RBC 3.87 05/10/2023 1015   HGB 10.8 (L) 05/10/2023 1015   HCT 31.7 (L) 05/10/2023 1015   PLT 240 05/10/2023 1015   MCV 81.9 05/10/2023 1015   MCH 27.9 05/10/2023 1015   MCHC 34.1 05/10/2023 1015   RDW 13.2 05/10/2023 1015   LYMPHSABS 1.2 05/10/2023 1015   MONOABS 0.5 05/10/2023 1015   EOSABS 0.2 05/10/2023 1015   BASOSABS 0.1 05/10/2023 1015    CMP     Component Value Date/Time   NA 134 (L) 05/10/2023 1015   K 3.8 05/10/2023 1015   CL 100 05/10/2023 1015   CO2 29 05/10/2023 1015   GLUCOSE 100 (H) 05/10/2023 1015   BUN 7 (L) 05/10/2023 1015   CREATININE 0.94 05/10/2023 1015   CALCIUM 8.7 (L)  05/10/2023 1015   PROT 6.1 (L) 05/10/2023 1015  ALBUMIN 3.9 05/10/2023 1015   AST 19 05/10/2023 1015   ALT 15 05/10/2023 1015   ALKPHOS 83 05/10/2023 1015   BILITOT 0.5 05/10/2023 1015   GFRNONAA >60 05/10/2023 1015   GFRAA  01/13/2009 1007    >60        The eGFR has been calculated using the MDRD equation. This calculation has not been validated in all clinical situations. eGFR's persistently <60 mL/min signify possible Chronic Kidney Disease.     Assessment/Plan:   77 year old female with breast cancer undergoing chemotherapy, IDA since 05/2022 without overt bleeding, and chronic diarrhea, presents with worsening chronic diarrhea and CT concerning for colitis most prominent in distal colon.  Diarrhea Colitis seen on CT scan Discussed with patient getting colonoscopy for further evaluation of colitis seen on CT scan.  No previous evidence of IBD.  Unknown last colonoscopy.  Will obtain records from [facility].  Worsening diarrhea since starting chemotherapy. Less likely infectious since ongoing for many years and worsening since Chemo initiation one year prior.  - Colonoscopy with biopsies for further evaluation - I thoroughly discussed the procedure with the patient (at bedside) to include nature of the procedure, alternatives, benefits, and risks (including but not limited to bleeding, infection, perforation, anesthesia/cardiac pulmonary complications).  Patient verbalized understanding and gave verbal consent to proceed with procedure. - Fecal calprotectin - CBC, CMP  Iron deficiency anemia Ongoing since 05/2022 without overt bleeding.  Discussed getting EGD/colonoscopy together.  However, next available for double is in November.  Getting colonoscopy by itself is much more manageable to get within a reasonable timeframe so we will pursue colonoscopy for evaluation of colitis and consider EGD once that is complete as hemoglobin is stable and there is no overt bleeding.  - CBC,  CMP, iron studies - Pending colonoscopy findings may do EGD at later date for evaluation of IDA - Follow-up per procedure  [facility] 06/25/2023, 9:18 AM  Cc: Lupita Raider, MD

## 2023-06-25 NOTE — Patient Instructions (Addendum)
Your provider has requested that you go to the basement level for lab work before leaving today. Press "B" on the elevator. The lab is located at the first door on the left as you exit the elevator.  You have been scheduled for a colonoscopy. Please follow written instructions given to you at your visit today.   Please pick up your prep supplies at the pharmacy within the next 1-3 days.  If you use inhalers (even only as needed), please bring them with you on the day of your procedure.  DO NOT TAKE 7 DAYS PRIOR TO TEST- Trulicity (dulaglutide) Ozempic, Wegovy (semaglutide) Mounjaro (tirzepatide) Bydureon Bcise (exanatide extended release)  DO NOT TAKE 1 DAY PRIOR TO YOUR TEST Rybelsus (semaglutide) Adlyxin (lixisenatide) Victoza (liraglutide) Byetta (exanatide)   We will request your record from [facility]  We have sent the following medications to your pharmacy for you to pick up at your convenience: Suprep  _______________________________________________________  If your blood pressure at your visit was 140/90 or greater, please contact your primary care physician to follow up on this.  _______________________________________________________  If you are age 33 or older, your body mass index should be between 23-30. Your Body mass index is 24.37 kg/m. If this is out of the aforementioned range listed, please consider follow up with your Primary Care Provider.  If you are age 57 or younger, your body mass index should be between 19-25. Your Body mass index is 24.37 kg/m. If this is out of the aformentioned range listed, please consider follow up with your Primary Care Provider.   ________________________________________________________  The [facility] providers would like to encourage you to use [facility] to communicate with providers for non-urgent requests or questions.  Due to long hold times on the telephone, sending your provider a message by [facility] may be a  faster and more efficient way to get a response.  Please allow 48 business hours for a response.  Please remember that this is for non-urgent requests.  _______________________________________________________  Due to recent changes in healthcare laws, you may see the results of your imaging and laboratory studies on MyChart before your provider has had a chance to review them.  We understand that in some cases there may be results that are confusing or concerning to you. Not all laboratory results come back in the same time frame and the provider may be waiting for multiple results in order to interpret others.  Please give [place] 48 hours in order for your provider to thoroughly review all the results before contacting the office for clarification of your results.   Thank you for entrusting me with your care and choosing [facility].  Bayley Leanna Sato PA-C

## 2023-06-26 ENCOUNTER — Encounter: Payer: Self-pay | Admitting: *Deleted

## 2023-06-26 NOTE — Addendum Note (Signed)
Addended by: Richardson Chiquito on: 06/26/2023 11:54 AM   Modules accepted: Orders

## 2023-06-26 NOTE — Progress Notes (Signed)
Agree with the assessment and plan as outlined by Cira Servant, PA-C.   Patient with longstanding history of chronic diarrhea, worsening since starting chemotherapy.  Agree that chemotherapy induced diarrhea would be highest in DDx, but CT with colon wall thickening concerning for colitis.  I do think it is reasonable to rule out infectious etiology with GI PCR panel, along with additional labs as outlined below.  Colonoscopy with random and directed biopsies very reasonable.  May need to also consider other medication ADR (i.e. ARB's, etc.) depending on colonoscopy and lab results.  Will additionally evaluate for sources of IDA during colonoscopy.  Ideally would get both EGD and colonoscopy done at the same time.  If unable to get EGD done at the same time, can do labs, iron studies, etc. now, and tentatively scheduled for EGD at later date, also with random and directed biopsies.    Dottie, this patient is scheduled for colonoscopy with me on 10/28.  However, looks like I may have had a cancellation on 10/7 that allows for EGD/colonoscopy to be done in the afternoon.  Can you see if patient is able to modify to 10/7?  Just hoping to expedite this for her and make it more convenient by having both procedures on the same day.  Prynce Jacober, DO, [facility]

## 2023-06-26 NOTE — Progress Notes (Signed)
I have spoken to patient to advise of additional recommendation to add EGD to colon for evaluation of IDA. Patient agreeable. She has changed her appointment to 07/09/23 to accommodate the need for added procedure. Updated prep instructions have been created and made available via mychart.   Patient is also advised of recommendation for GI pathogen panel. Kit placed at front desk and patient is advised to return this ASAP to which she also verbalizes understanding.

## 2023-06-26 NOTE — Progress Notes (Signed)
I have left a message for patient to call back.  I have also created order for [facility] GI pathogen panel with C Diff and will place kit at 3rd floor front desk for pick up.

## 2023-06-26 NOTE — Addendum Note (Signed)
Addended by: Richardson Chiquito on: 06/26/2023 11:09 AM   Modules accepted: Orders

## 2023-06-27 ENCOUNTER — Ambulatory Visit: Payer: Medicare Other

## 2023-06-27 DIAGNOSIS — R197 Diarrhea, unspecified: Secondary | ICD-10-CM

## 2023-06-27 DIAGNOSIS — D509 Iron deficiency anemia, unspecified: Secondary | ICD-10-CM

## 2023-06-27 DIAGNOSIS — R9389 Abnormal findings on diagnostic imaging of other specified body structures: Secondary | ICD-10-CM

## 2023-06-27 DIAGNOSIS — K529 Noninfective gastroenteritis and colitis, unspecified: Secondary | ICD-10-CM | POA: Diagnosis not present

## 2023-06-28 NOTE — Assessment & Plan Note (Signed)
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  T4 vertebral body Bone scan 04/25/2022: Abnormal uptake in the sternum, right parietal region of calvarium, right posterior seventh rib, T5, left sternoclavicular joint   Treatment: Palliative chemotherapy with Taxotere [name] and Perjeta every 3 weeks x1 cycle given 05/05/2022 (discontinued for severe toxicities and hospitalization)   ---------------------------------------------------------------------------------------------------------------------------------------------- Chemotoxicities: Hospitalization 05/11/2022-05/18/2022: Convulsive syncope, chemo induced neutropenia, oral mucositis, chronic CHF, severe diarrhea with dehydration ECHO 06/26/22: EF 55-60% (follows with [facility])   Treatment plan:05/26/22 [name]. added Perjeta 06/21/22.   07/30/2022: Bone scan: Interval resolution/decreasing accumulation of multiple bone metastases 10/30/2022: CT CAP: Mildly prominent mediastinal lymph nodes are similar, no pulmonary metastasis, skeletal lesions increasing sclerosis, no visceral metastases 01/16/2023: CT CAP: Stable prominent mediastinal lymph nodes, slight increase in the small nodes in the mesentery could be reactive, scattered bone metastases stable 05/16/2023: CT CAP: Stable bone metastases, unchanged pretracheal lymph nodes.  Previous mesenteric lymph nodes not conspicuous.  Diffuse colonic mucosal thickening: Colitis   Recommendation: Referral to gastroenterology, I will discontinue Perjeta based on excellent scans in the presence of colitis.

## 2023-06-29 ENCOUNTER — Telehealth: Payer: Self-pay | Admitting: Gastroenterology

## 2023-06-29 LAB — CALPROTECTIN, FECAL: Calprotectin, Fecal: 12 ug/g (ref 0–120)

## 2023-06-29 NOTE — Telephone Encounter (Signed)
PT is returning call. Please advise.  

## 2023-07-02 ENCOUNTER — Other Ambulatory Visit: Payer: Self-pay | Admitting: *Deleted

## 2023-07-02 ENCOUNTER — Encounter: Payer: Self-pay | Admitting: *Deleted

## 2023-07-02 ENCOUNTER — Inpatient Hospital Stay (HOSPITAL_BASED_OUTPATIENT_CLINIC_OR_DEPARTMENT_OTHER): Payer: Medicare Other | Admitting: Hematology and Oncology

## 2023-07-02 ENCOUNTER — Telehealth: Payer: Self-pay | Admitting: Gastroenterology

## 2023-07-02 ENCOUNTER — Inpatient Hospital Stay: Payer: Medicare Other

## 2023-07-02 VITALS — BP 179/70 | HR 70 | Temp 97.2°F | Resp 18 | Ht 66.0 in | Wt 150.3 lb

## 2023-07-02 DIAGNOSIS — Z9012 Acquired absence of left breast and nipple: Secondary | ICD-10-CM | POA: Diagnosis not present

## 2023-07-02 DIAGNOSIS — Z171 Estrogen receptor negative status [ER-]: Secondary | ICD-10-CM

## 2023-07-02 DIAGNOSIS — Z5112 Encounter for antineoplastic immunotherapy: Secondary | ICD-10-CM | POA: Diagnosis not present

## 2023-07-02 DIAGNOSIS — Z79899 Other long term (current) drug therapy: Secondary | ICD-10-CM | POA: Diagnosis not present

## 2023-07-02 DIAGNOSIS — C50412 Malignant neoplasm of upper-outer quadrant of left female breast: Secondary | ICD-10-CM

## 2023-07-02 DIAGNOSIS — C7951 Secondary malignant neoplasm of bone: Secondary | ICD-10-CM | POA: Diagnosis not present

## 2023-07-02 MED ORDER — VANCOMYCIN HCL 125 MG PO CAPS: 125.0000 mg | ORAL_CAPSULE | Freq: Four times a day (QID) | ORAL | 0 refills | Status: AC

## 2023-07-02 NOTE — Telephone Encounter (Signed)
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 I have advised on transmission/prevention of C Diff as well.  Patient advised to begin probiotic (florastor) daily x 4 week course following vancomycin treatment.   Patient has also been advised of need to reschedule endoscopy/colonoscopy until after full treatment has been given for C DIff. She has rescheduled to 08/03/23 at 330 pm. New instructions have been created and made available to her via mychart.  Patient verbalizes understanding of all instructions and denies any questions at [facility] time.

## 2023-07-02 NOTE — Telephone Encounter (Signed)
See phone note dated 07/02/23 for additional information.

## 2023-07-02 NOTE — Progress Notes (Signed)
Patient Care Team: Lupita Raider, MD as PCP - General (Family Medicine) Van Clines, MD as Consulting Physician (Neurology) Pershing Proud, RN as Oncology Nurse Navigator Donnelly Angelica, RN as Oncology Nurse Navigator Abigail Miyamoto, MD as Consulting Physician (General Surgery) Serena Croissant, MD as Consulting Physician (Hematology and Oncology) Lonie Peak, MD as Attending Physician (Radiation Oncology)  DIAGNOSIS:  Encounter Diagnosis  Name Primary?   Malignant neoplasm of upper-outer quadrant of left breast in female, estrogen receptor negative (HCC) Yes    SUMMARY OF ONCOLOGIC HISTORY: Oncology History  Malignant neoplasm of upper-outer quadrant of left breast in female, estrogen receptor negative (HCC)  02/10/2022 Initial Diagnosis   Palpable left breast masses and calcifications: 1.8 cm at 1:00 and 1.7 cm at 9:00, calcifications not biopsy.  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 Report date is 03/05/2022.  The CustomNext-Cancer+RNAinsight panel offered by [facility] includes sequencing and rearrangement analysis for the following 48 genes:  APC, ATM, AXIN2, BARD1, BMPR1A, BRCA1, BRCA2, BRIP1, CDH1, CDK4, CDKN2A, CHEK2, CTNNA1, DICER1, EGFR, EPCAM, GREM1, HOXB13, KIT, MEN1, MLH1, MSH2, MSH3, MSH6, MUTYH, NBN, NF1, NTHL1, PALB2, PDGFRA, PMS2, POLD1, POLE, PTEN, RAD50, RAD51C, RAD51D, SDHA, SDHB, SDHC, SDHD, SMAD4, SMARCA4, STK11, TP53, TSC1, TSC2, and VHL.  RNA data is routinely analyzed for use in variant interpretation for all genes.   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, LCIS, 5/5 lymph nodes positive with extranodal extension, margins negative ER 0%, PR 0%, HER2 3+, Ki-67 20%   04/27/2022 Cancer Staging   Staging form: Breast, AJCC 8th Edition - Pathologic: Stage IIIA (pT2, pN2, cM0, G2, ER-, PR-, HER2+) - Signed by Serena Croissant, MD on 04/27/2022 Histologic grading system: 3 grade system   05/05/2022 - 05/26/2022 Chemotherapy   Patient is on Treatment Plan : BREAST DOCEtaxel + Trastuzumab + Pertuzumab (THP) q21d x 8 cycles / Trastuzumab + Pertuzumab q21d x 4 cycles     05/26/2022 -  Chemotherapy   Patient is on Treatment Plan : BREAST MAINTENANCE Trastuzumab IV (6) or SQ (600) D1 q21d X 11 Cycles     06/12/2023 Cancer Staging   Staging form: Breast, AJCC 8th Edition - Pathologic: Stage IV (cM1) - Signed by [name], NP on 06/12/2023     CHIEF COMPLIANT: Follow-up on [name], C. difficile infection  Discussed the use of AI scribe software for clinical note transcription with the patient, who gave verbal consent to proceed.  History of Present Illness   The patient, with a history of breast cancer, presents with a recent diagnosis of C. Diff, high blood pressure, and balance issues. She reports experiencing pain in the jaw, fatigue, and headaches. The patient's blood pressure is high and she is experiencing balance issues, which could be due to the infection or blood pressure. The patient's potassium levels are also low, which could be due to the patient losing potassium through her bowels  ALLERGIES:  is allergic to oxybutynin, prozac [fluoxetine hcl], ritalin [methylphenidate], rosuvastatin, and lyrica [pregabalin].  MEDICATIONS:  Current Outpatient Medications  Medication Sig  Dispense Refill   B Complex-C (B COMPLEX-VITAMIN C) CAPS Take 1 capsule by mouth daily.     buPROPion (WELLBUTRIN XL) 300 MG 24 hr tablet Take 300 mg by mouth every morning.     cholecalciferol  (VITAMIN D3) 25 MCG (1000 UNIT) tablet Take 1,000 Units by mouth daily.     clonazePAM (KLONOPIN) 1 MG tablet Take 1 mg by mouth at bedtime.     Cyanocobalamin (VITAMIN B-12) 5000 MCG TBDP Take 5,000 Units by mouth daily.     diphenhydrAMINE (BENADRYL) 25 MG tablet Take 25 mg by mouth in the morning.     fluticasone (FLONASE) 50 MCG/ACT nasal spray Place 1 spray into both nostrils daily as needed for allergies.     gabapentin (NEURONTIN) 100 MG capsule Take 1 capsule (100 mg total) by mouth at bedtime. 14 capsule 0   lidocaine-prilocaine (EMLA) cream Apply 1 Application topically as needed. Apply one application daily as needed to port a cath site. 30 g 3   losartan (COZAAR) 100 MG tablet Take 1 tablet (100 mg total) by mouth daily. 90 tablet 3   meloxicam (MOBIC) 15 MG tablet Take 15 mg by mouth daily.     methylphenidate (RITALIN) 5 MG tablet Take 5 mg by mouth 2 (two) times daily as needed.     Na Sulfate-K Sulfate-Mg Sulf 17.5-3.13-1.6 GM/177ML SOLN Use as directed; may use generic; goodrx card if insurance will not cover generic 354 mL 0   traZODone (DESYREL) 150 MG tablet Take 225 mg by mouth at bedtime.     vancomycin (VANCOCIN) 125 MG capsule Take 1 capsule (125 mg total) by mouth 4 (four) times daily for 14 days. 56 capsule 0   venlafaxine XR (EFFEXOR-XR) 75 MG 24 hr capsule Take 75 mg by mouth daily with breakfast.     Potassium 99 MG TABS Take 99 mg by mouth at bedtime.     No current facility-administered medications for this visit.    PHYSICAL EXAMINATION: ECOG PERFORMANCE STATUS: 1 - Symptomatic but completely ambulatory  Vitals:   07/02/23 1146  BP: (!) 179/70  Pulse: 70  Resp: 18  Temp: (!) 97.2 F (36.2 C)  SpO2: 100%   Filed Weights   07/02/23 1146  Weight: 150 lb 4.8 oz (68.2 kg)    LABORATORY DATA:  I have reviewed the data as listed    Latest Ref Rng & Units 06/25/2023   10:20 AM 05/10/2023   10:15 AM 04/09/2023   11:30 AM  CMP  Glucose 70 - 99 mg/dL 93   865  98   BUN 6 - 23 mg/dL 8  7  13    Creatinine 0.40 - 1.20 mg/dL 7.84  6.96  2.95   Sodium 135 - 145 mEq/L 140  134  134   Potassium 3.5 - 5.1 mEq/L 3.7  3.8  4.1   Chloride 96 - 112 mEq/L 104  100  102   CO2 19 - 32 mEq/L 30  29  27    Calcium 8.4 - 10.5 mg/dL 9.1  8.7  8.9   Total Protein 6.0 - 8.3 g/dL 6.3  6.1  6.0   Total Bilirubin 0.2 - 1.2 mg/dL 0.7  0.5  0.5   Alkaline Phos 39 - 117 U/L 76  83  88   AST 0 - 37 U/L 19  19  18    ALT 0 - 35 U/L 16  15  17      Lab Results  Component Value Date  WBC 6.4 06/25/2023   HGB 11.4 (L) 06/25/2023   HCT 35.5 (L) 06/25/2023   MCV 84.6 06/25/2023   PLT 280.0 06/25/2023   NEUTROABS 4.5 06/25/2023    ASSESSMENT & PLAN:  Malignant neoplasm of upper-outer quadrant of left breast in female, estrogen receptor negative (HCC) 04/07/2022:Left mastectomy: 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  T4 vertebral body Bone scan 04/25/2022: Abnormal uptake in the sternum, right parietal region of calvarium, right posterior seventh rib, T5, left sternoclavicular joint   Treatment: Palliative chemotherapy with Taxotere [name] and Perjeta every 3 weeks x1 cycle given 05/05/2022 (discontinued for severe toxicities and hospitalization)   ---------------------------------------------------------------------------------------------------------------------------------------------- Chemotoxicities: Hospitalization 05/11/2022-05/18/2022: Convulsive syncope, chemo induced neutropenia, oral mucositis, chronic CHF, severe diarrhea with dehydration ECHO 06/26/22: EF 55-60% (follows with [facility])   Treatment plan:05/26/22 [name]. added Perjeta 06/21/22.   07/30/2022: Bone scan: Interval resolution/decreasing accumulation  of multiple bone metastases 10/30/2022: CT CAP: Mildly prominent mediastinal lymph nodes are similar, no pulmonary metastasis, skeletal lesions increasing sclerosis, no visceral metastases 01/16/2023: CT CAP: Stable prominent mediastinal lymph nodes, slight increase in the small nodes in the mesentery could be reactive, scattered bone metastases stable 05/16/2023: CT CAP: Stable bone metastases, unchanged pretracheal lymph nodes.  Previous mesenteric lymph nodes not conspicuous.  Diffuse colonic mucosal thickening: Colitis   Recommendation: Referral to gastroenterology, I will discontinue Perjeta based on excellent scans in the presence of colitis.  ------------------------------------- Assessment and Plan    Clostridium difficile infection Patient reports fatigue and imbalance. Currently on Vancomycin and probiotics (Culturelle). -Continue Vancomycin and probiotics. -Plan for colonoscopy to further assess.  Hypertension Patient reports high blood pressure despite taking medication. -Advise patient to follow up with primary care physician for potential adjustment of antihypertensive medication.  Imbalance Patient reports stumbling and imbalance. Possible causes include hypertension and current infection. -Order MRI of the brain to rule out other potential causes.  Lymphedema Patient reports left arm swelling post-lymph node removal. -Advise patient to wear compression sleeve during flight to manage swelling. -Refer to physical therapy for further management.  Hypokalemia Patient's potassium levels are low, potentially due to diarrhea or urinary loss. -Advise patient to consume potassium-rich foods. -Prescribe 10 mcg of potassium supplement to be taken until next infusion.  Breast Cancer Patient is currently on [name] maintenance therapy. Today's treatment is being held because there is no isolation room available. She is starting on vancomycin orally. -Reschedule [name]  infusion due to need for isolation room. -Next chemotherapy session scheduled for October 3    Orders Placed This Encounter  Procedures   MR Brain W Wo Contrast    Standing Status:   Future    Standing Expiration Date:   07/01/2024    Order Specific Question:   If indicated for the ordered procedure, I authorize the administration of contrast media per Radiology protocol    Answer:   Yes    Order Specific Question:   What is the patient's sedation requirement?    Answer:   No Sedation    Order Specific Question:   Does the patient have a pacemaker or implanted devices?    Answer:   No    Order Specific Question:   Use SRS Protocol?    Answer:   No    Order Specific Question:  Preferred imaging location?    Answer:   [facility] (table limit - 550 lbs)    Order Specific Question:   Release to patient    Answer:   Immediate   The patient has a good understanding of the overall plan. she agrees with it. she will call with any problems that may develop before the next visit here. Total time spent: 30 mins including face to face time and time spent for planning, charting and co-ordination of care   Tamsen Meek, MD 07/02/23

## 2023-07-02 NOTE — Telephone Encounter (Signed)
Weekend on-call was notified that the [employer] panel was positive for C. difficile.  I cannot see the results yet in the EMR, but lets move forward with treatment.  Plan for the following:  - Vancomycin 125 mg 4 times daily x 14 days - Start probiotic at the end of vancomycin course and continue for 4 weeks - Currently scheduled for EGD/colonoscopy on 07/09/2023.  Best option would be to delay procedures to allow appropriate treatment and can reschedule to a date later in October

## 2023-07-03 NOTE — Telephone Encounter (Signed)
Left message for patient to call back. I have talked with our Museum/gallery curator. Patient is known as "Allison Pierce" on her licence as well as on her [employer] card. Unfortunately, this is the only way we are able to identify her in our system. We cannot change the way her name is on the order as it is already correct.

## 2023-07-03 NOTE — Telephone Encounter (Signed)
Patient states her insurance says Simar Pothier and our order says Nessie B. And insurance will not cover medication until the name is stated on the order. Patient is requesting for [place] to call pharmacy to clarify order.

## 2023-07-04 ENCOUNTER — Telehealth: Payer: Self-pay | Admitting: Gastroenterology

## 2023-07-04 ENCOUNTER — Telehealth: Payer: Self-pay | Admitting: Hematology and Oncology

## 2023-07-04 ENCOUNTER — Encounter: Payer: Self-pay | Admitting: *Deleted

## 2023-07-04 NOTE — Telephone Encounter (Signed)
I have spoken to patient who states that she was actually able to get 40 capsules of vancomycin today and she will pick up the remainder 16 tablets at the pharmacy in a few days to finish out her full 14 day vancomycin course for C Diff.

## 2023-07-04 NOTE — Telephone Encounter (Signed)
Patient is advised that unfortunately, we are unable to change her name in our system to "Gretta Arab" as both her license and medicare insurance cards identify her as "Allison Pierce." I advised she would need to contact her [employer] insurance regarding getting name change on their end.   Patient asks when she will be able to restart chemo as they would not let her have it last week. I advised that she would first need to have treatment for C Diff but ultimate timing would be deferred to oncology.   Patient also requests to know again about her colonoscopy time/date. I reiterated her information and again referred her to the updated instructions made available via mychart to her. In addition, I explained that she must have been fully treated for her C Diff before we would be able to go forward with procedure on our end.  Patient verbalizes understanding of all information and will call us if we can help further.

## 2023-07-04 NOTE — Telephone Encounter (Signed)
Received phone call from patient who would like to speak to you regarding her upcoming colonoscopy.  Please call.  Thank you.

## 2023-07-04 NOTE — Telephone Encounter (Signed)
Patient is returning your call.  

## 2023-07-05 ENCOUNTER — Encounter: Payer: Self-pay | Admitting: Hematology and Oncology

## 2023-07-05 ENCOUNTER — Inpatient Hospital Stay: Payer: Medicare Other | Attending: Hematology and Oncology

## 2023-07-05 VITALS — BP 159/70 | HR 79 | Temp 97.9°F | Resp 16 | Wt 148.5 lb

## 2023-07-05 DIAGNOSIS — Z5112 Encounter for antineoplastic immunotherapy: Secondary | ICD-10-CM | POA: Insufficient documentation

## 2023-07-05 DIAGNOSIS — Z803 Family history of malignant neoplasm of breast: Secondary | ICD-10-CM | POA: Insufficient documentation

## 2023-07-05 DIAGNOSIS — R5383 Other fatigue: Secondary | ICD-10-CM | POA: Diagnosis not present

## 2023-07-05 DIAGNOSIS — Z9012 Acquired absence of left breast and nipple: Secondary | ICD-10-CM | POA: Diagnosis not present

## 2023-07-05 DIAGNOSIS — Z79899 Other long term (current) drug therapy: Secondary | ICD-10-CM | POA: Diagnosis not present

## 2023-07-05 DIAGNOSIS — Z801 Family history of malignant neoplasm of trachea, bronchus and lung: Secondary | ICD-10-CM | POA: Diagnosis not present

## 2023-07-05 DIAGNOSIS — Z8042 Family history of malignant neoplasm of prostate: Secondary | ICD-10-CM | POA: Insufficient documentation

## 2023-07-05 DIAGNOSIS — C7951 Secondary malignant neoplasm of bone: Secondary | ICD-10-CM | POA: Insufficient documentation

## 2023-07-05 DIAGNOSIS — C50412 Malignant neoplasm of upper-outer quadrant of left female breast: Secondary | ICD-10-CM | POA: Diagnosis not present

## 2023-07-05 DIAGNOSIS — Z171 Estrogen receptor negative status [ER-]: Secondary | ICD-10-CM | POA: Insufficient documentation

## 2023-07-05 MED ORDER — TRASTUZUMAB-DKST CHEMO 150 MG IV SOLR
6.0000 mg/kg | Freq: Once | INTRAVENOUS | Status: AC
  Administered 2023-07-05: 420 mg via INTRAVENOUS
  Filled 2023-07-05: qty 20

## 2023-07-05 MED ORDER — DIPHENHYDRAMINE HCL 25 MG PO CAPS
25.0000 mg | ORAL_CAPSULE | Freq: Once | ORAL | Status: AC
  Administered 2023-07-05: 25 mg via ORAL
  Filled 2023-07-05: qty 1

## 2023-07-05 MED ORDER — ACETAMINOPHEN 325 MG PO TABS
650.0000 mg | ORAL_TABLET | Freq: Once | ORAL | Status: AC
  Administered 2023-07-05: 650 mg via ORAL
  Filled 2023-07-05: qty 2

## 2023-07-05 MED ORDER — HEPARIN SOD (PORK) LOCK FLUSH 100 UNIT/ML IV SOLN
500.0000 [IU] | Freq: Once | INTRAVENOUS | Status: AC | PRN
  Administered 2023-07-05: 500 [IU]

## 2023-07-05 MED ORDER — SODIUM CHLORIDE 0.9 % IV SOLN: Freq: Once | INTRAVENOUS | Status: AC

## 2023-07-05 MED ORDER — SODIUM CHLORIDE 0.9% FLUSH
10.0000 mL | INTRAVENOUS | Status: DC | PRN
  Administered 2023-07-05: 10 mL

## 2023-07-05 NOTE — Patient Instructions (Signed)
[place] [facility]  Discharge Instructions: Thank you for choosing [facility] to provide your oncology and hematology care.   If you have a lab appointment with the Cancer Center, please go directly to the Cancer Center and check in at the registration area.   Wear comfortable clothing and clothing appropriate for easy access to any Portacath or PICC line.   We strive to give you quality time with your provider. You may need to reschedule your appointment if you arrive late (15 or more minutes).  Arriving late affects you and other patients whose appointments are after yours.  Also, if you miss three or more appointments without notifying the office, you may be dismissed from the clinic at the provider's discretion.      For prescription refill requests, have your pharmacy contact our office and allow 72 hours for refills to be completed.    Today you received the following chemotherapy and/or immunotherapy agents: OGIVRI      To help prevent nausea and vomiting after your treatment, we encourage you to take your nausea medication as directed.  BELOW ARE SYMPTOMS THAT SHOULD BE REPORTED IMMEDIATELY: *FEVER GREATER THAN 100.4 F (38 C) OR HIGHER *CHILLS OR SWEATING *NAUSEA AND VOMITING THAT IS NOT CONTROLLED WITH YOUR NAUSEA MEDICATION *UNUSUAL SHORTNESS OF BREATH *UNUSUAL BRUISING OR BLEEDING *URINARY PROBLEMS (pain or burning when urinating, or frequent urination) *BOWEL PROBLEMS (unusual diarrhea, constipation, pain near the anus) TENDERNESS IN MOUTH AND THROAT WITH OR WITHOUT PRESENCE OF ULCERS (sore throat, sores in mouth, or a toothache) UNUSUAL RASH, SWELLING OR PAIN  UNUSUAL VAGINAL DISCHARGE OR ITCHING   Items with * indicate a potential emergency and should be followed up as soon as possible or go to the Emergency Department if any problems should occur.  Please show the CHEMOTHERAPY ALERT CARD or IMMUNOTHERAPY ALERT CARD at check-in  to the Emergency Department and triage nurse.  Should you have questions after your visit or need to cancel or reschedule your appointment, please contact [place] [facility]  Dept: [phone]  and follow the prompts.  Office hours are 8:00 a.m. to 4:30 p.m. Monday - Friday. Please note that voicemails left after 4:00 p.m. may not be returned until the following business day.  We are closed weekends and major holidays. You have access to a nurse at all times for urgent questions. Please call the main number to the clinic Dept: [phone] and follow the prompts.   For any non-urgent questions, you may also contact your provider using MyChart. We now offer e-Visits for anyone 9 and older to request care online for non-urgent symptoms. For details visit [email or web address].   Also download the MyChart app! Go to the app store, search "MyChart", open the app, select [facility], and log in with your MyChart username and password.

## 2023-07-09 ENCOUNTER — Encounter: Payer: Medicare Other | Admitting: Gastroenterology

## 2023-07-10 NOTE — Telephone Encounter (Signed)
Inbound call from patient, would like to discuss vancomycin, states she is having body aches and also to see if she still is contagious. Would like to discuss with a nurse.

## 2023-07-10 NOTE — Telephone Encounter (Signed)
Left message for patient to call back  

## 2023-07-10 NOTE — Telephone Encounter (Addendum)
Patient calls with a couple of concerns. 1-patient would like to know if she is "still contagious." I advised that I would still consider her contagious until she has completed the full course of medication and diarrhea has subsided.  Patient also states that she has been having generalized muscle pain since being on vancomycin. We discussed that her muscle pain was more likely coming from a combination of her current breast cancer treatments and recent discontinuation of meloxicam (she she has been on chronically but stopped 3 days ago) rather than vancomycin. Patient does tell me that her diarrhea has continually improved since beginning the medication 6 days ago. She is asked to continue the vancomycin until 14 day course is complete unless I get orders from physician for discontinuation for some reason.  Also of note, patients' oncology note from Dr Pamelia Hoit 07/02/23 mentions that the patient has hypokalemia and rx would be given for 10 mEq potassium. I am unable to see any recent studies showing hypokalemia and patient says she has not had labs done at [facility] recently nor has she ever been given prescription potassium. Maybe this was an excerpt from an old note?! Not sure if any current hypokalemia would play a part in her general muscle pain.  Please advise if any additional thoughts/recommendations at this time.

## 2023-07-11 NOTE — Telephone Encounter (Signed)
Patient is advised to continue oral vancomycin until full 14 day course has been completed and to continue oral hydration and supportive measures as before. She verbalizes understanding of this information.

## 2023-07-11 NOTE — Telephone Encounter (Signed)
===  View-only below this line=== ----- Message ----- From: Shellia Cleverly, DO Sent: 07/11/2023   8:15 AM EDT To: Richardson Chiquito, RN  Agree, K was normal at 3.7 on 06/25/2023, so maybe that was excerpt from an old note?  Otherwise not much in the way of myalgias with oral vancomycin.  Otherwise, glad to hear the diarrhea is improving.  I recommend continuing vancomycin until completion.  Continue hydration and other supportive care as well. ----- Message -----

## 2023-07-16 NOTE — Telephone Encounter (Signed)
Inbound call from patient in regards to previous note. Please advise.

## 2023-07-17 NOTE — Telephone Encounter (Signed)
Patient calling to confirm how long she is supposed to take the vancomycin script. Advised she needs 4 times daily dosing x 14 days. She verbalizes understanding.

## 2023-07-23 ENCOUNTER — Encounter: Payer: Self-pay | Admitting: Adult Health

## 2023-07-23 ENCOUNTER — Inpatient Hospital Stay: Payer: Medicare Other

## 2023-07-23 ENCOUNTER — Inpatient Hospital Stay (HOSPITAL_BASED_OUTPATIENT_CLINIC_OR_DEPARTMENT_OTHER): Payer: Medicare Other | Admitting: Adult Health

## 2023-07-23 VITALS — BP 149/59 | HR 80 | Temp 98.1°F | Resp 18 | Ht 66.0 in | Wt 150.3 lb

## 2023-07-23 DIAGNOSIS — Z171 Estrogen receptor negative status [ER-]: Secondary | ICD-10-CM

## 2023-07-23 DIAGNOSIS — R5383 Other fatigue: Secondary | ICD-10-CM

## 2023-07-23 DIAGNOSIS — Z5112 Encounter for antineoplastic immunotherapy: Secondary | ICD-10-CM | POA: Diagnosis not present

## 2023-07-23 DIAGNOSIS — E559 Vitamin D deficiency, unspecified: Secondary | ICD-10-CM

## 2023-07-23 DIAGNOSIS — C7951 Secondary malignant neoplasm of bone: Secondary | ICD-10-CM | POA: Diagnosis not present

## 2023-07-23 DIAGNOSIS — Z9012 Acquired absence of left breast and nipple: Secondary | ICD-10-CM | POA: Diagnosis not present

## 2023-07-23 DIAGNOSIS — C50412 Malignant neoplasm of upper-outer quadrant of left female breast: Secondary | ICD-10-CM

## 2023-07-23 LAB — CBC WITH DIFFERENTIAL (CANCER CENTER ONLY)
Abs Immature Granulocytes: 0.01 10*3/uL (ref 0.00–0.07)
Basophils Absolute: 0.1 10*3/uL (ref 0.0–0.1)
Basophils Relative: 1 %
Eosinophils Absolute: 0.2 10*3/uL (ref 0.0–0.5)
Eosinophils Relative: 3 %
HCT: 34.3 % — ABNORMAL LOW (ref 36.0–46.0)
Hemoglobin: 11.2 g/dL — ABNORMAL LOW (ref 12.0–15.0)
Immature Granulocytes: 0 %
Lymphocytes Relative: 14 %
Lymphs Abs: 0.9 10*3/uL (ref 0.7–4.0)
MCH: 27.7 pg (ref 26.0–34.0)
MCHC: 32.7 g/dL (ref 30.0–36.0)
MCV: 84.7 fL (ref 80.0–100.0)
Monocytes Absolute: 0.6 10*3/uL (ref 0.1–1.0)
Monocytes Relative: 9 %
Neutro Abs: 4.7 10*3/uL (ref 1.7–7.7)
Neutrophils Relative %: 73 %
Platelet Count: 276 10*3/uL (ref 150–400)
RBC: 4.05 MIL/uL (ref 3.87–5.11)
RDW: 13.3 % (ref 11.5–15.5)
WBC Count: 6.4 10*3/uL (ref 4.0–10.5)
nRBC: 0 % (ref 0.0–0.2)

## 2023-07-23 LAB — CMP (CANCER CENTER ONLY)
ALT: 15 U/L (ref 0–44)
AST: 18 U/L (ref 15–41)
Albumin: 3.9 g/dL (ref 3.5–5.0)
Alkaline Phosphatase: 73 U/L (ref 38–126)
Anion gap: 4 — ABNORMAL LOW (ref 5–15)
BUN: 10 mg/dL (ref 8–23)
CO2: 29 mmol/L (ref 22–32)
Calcium: 9 mg/dL (ref 8.9–10.3)
Chloride: 105 mmol/L (ref 98–111)
Creatinine: 1.03 mg/dL — ABNORMAL HIGH (ref 0.44–1.00)
GFR, Estimated: 56 mL/min — ABNORMAL LOW (ref >60)
Glucose, Bld: 99 mg/dL (ref 70–99)
Potassium: 4.2 mmol/L (ref 3.5–5.1)
Sodium: 138 mmol/L (ref 135–145)
Total Bilirubin: 0.6 mg/dL (ref 0.3–1.2)
Total Protein: 6.2 g/dL — ABNORMAL LOW (ref 6.5–8.1)

## 2023-07-23 LAB — IRON AND IRON BINDING CAPACITY (CC-WL,HP ONLY)
Iron: 116 ug/dL (ref 28–170)
Saturation Ratios: 29 % (ref 10.4–31.8)
TIBC: 395 ug/dL (ref 250–450)
UIBC: 279 ug/dL (ref 148–442)

## 2023-07-23 LAB — VITAMIN D 25 HYDROXY (VIT D DEFICIENCY, FRACTURES): Vit D, 25-Hydroxy: 95.18 ng/mL (ref 30–100)

## 2023-07-23 LAB — FERRITIN: Ferritin: 15 ng/mL (ref 11–307)

## 2023-07-23 MED ORDER — SODIUM CHLORIDE 0.9 % IV SOLN: Freq: Once | INTRAVENOUS | Status: AC

## 2023-07-23 MED ORDER — ACETAMINOPHEN 325 MG PO TABS
650.0000 mg | ORAL_TABLET | Freq: Once | ORAL | Status: AC
  Administered 2023-07-23: 650 mg via ORAL
  Filled 2023-07-23: qty 2

## 2023-07-23 MED ORDER — DIPHENHYDRAMINE HCL 25 MG PO CAPS
25.0000 mg | ORAL_CAPSULE | Freq: Once | ORAL | Status: AC
  Administered 2023-07-23: 25 mg via ORAL
  Filled 2023-07-23: qty 1

## 2023-07-23 MED ORDER — SODIUM CHLORIDE 0.9% FLUSH
10.0000 mL | INTRAVENOUS | Status: DC | PRN
  Administered 2023-07-23: 10 mL

## 2023-07-23 MED ORDER — HEPARIN SOD (PORK) LOCK FLUSH 100 UNIT/ML IV SOLN
500.0000 [IU] | Freq: Once | INTRAVENOUS | Status: AC | PRN
  Administered 2023-07-23: 500 [IU]

## 2023-07-23 MED ORDER — SODIUM CHLORIDE 0.9 % IV SOLN
6.0000 mg/kg | Freq: Once | INTRAVENOUS | Status: AC
  Administered 2023-07-23: 420 mg via INTRAVENOUS
  Filled 2023-07-23: qty 20

## 2023-07-23 NOTE — Progress Notes (Signed)
[facility] Cancer Follow up:    Allison Raider, MD [address] [place] [address]   DIAGNOSIS:  Cancer Staging  Malignant neoplasm of upper-outer quadrant of left breast in female, estrogen receptor negative (HCC) Staging form: Breast, AJCC 8th Edition - Clinical: Stage IA (cT1c, cN0, cM0, G2, ER-, PR-, HER2+) - Signed by Serena Croissant, MD on 02/22/2022 Stage prefix: Initial diagnosis Histologic grading system: 3 grade system - Pathologic: Stage IIIA (pT2, pN2, cM0, G2, ER-, PR-, HER2+) - Signed by Serena Croissant, MD on 04/27/2022 Histologic grading system: 3 grade system - Pathologic: Stage IV (cM1) - Signed by [name], NP on 06/12/2023   SUMMARY OF ONCOLOGIC HISTORY: Oncology History  Malignant neoplasm of upper-outer quadrant of left breast in female, estrogen receptor negative (HCC)  02/10/2022 Initial Diagnosis   Palpable left breast masses and calcifications: 1.8 cm at 1:00 and 1.7 cm at 9:00, calcifications not biopsy.  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 Report date is 03/05/2022.  The CustomNext-Cancer+RNAinsight panel offered by [facility] includes sequencing and rearrangement analysis for the following 48 genes:  APC, ATM, AXIN2, BARD1, BMPR1A, BRCA1, BRCA2, BRIP1, CDH1, CDK4, CDKN2A, CHEK2, CTNNA1, DICER1, EGFR, EPCAM, GREM1, HOXB13, KIT, MEN1, MLH1, MSH2, MSH3, MSH6, MUTYH, NBN, NF1, NTHL1, PALB2, PDGFRA, PMS2, POLD1, POLE, PTEN, RAD50, RAD51C, RAD51D, SDHA, SDHB, SDHC, SDHD, SMAD4, SMARCA4, STK11, TP53, TSC1, TSC2, and VHL.   RNA data is routinely analyzed for use in variant interpretation for all genes.   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, LCIS, 5/5 lymph nodes positive with extranodal extension, margins negative ER 0%, PR 0%, HER2 3+, Ki-67 20%   04/27/2022 Cancer Staging   Staging form: Breast, AJCC 8th Edition - Pathologic: Stage IIIA (pT2, pN2, cM0, G2, ER-, PR-, HER2+) - Signed by Serena Croissant, MD on 04/27/2022 Histologic grading system: 3 grade system   05/05/2022 - 05/26/2022 Chemotherapy   Patient is on Treatment Plan : BREAST DOCEtaxel + Trastuzumab + Pertuzumab (THP) q21d x 8 cycles / Trastuzumab + Pertuzumab q21d x 4 cycles     05/26/2022 -  Chemotherapy   Patient is on Treatment Plan : BREAST MAINTENANCE Trastuzumab IV (6) or SQ (600) D1 q21d X 11 Cycles     06/12/2023 Cancer Staging   Staging form: Breast, AJCC 8th Edition - Pathologic: Stage IV (cM1) - Signed by [name], NP on 06/12/2023     CURRENT THERAPY: [name]  INTERVAL HISTORY: Allison Pierce 77 y.o. female returns for follow-up and evaluation prior to receiving [name] therapy.  At her last visit with Dr. Pamelia Hoit she had been experiencing imbalance and he ordered MRI brain.  This has not yet been scheduled.    Her most recent echocardiogram occurred on May 29, 2023 demonstrating a left ventricular ejection fraction of 60 to 65%.  She follows with Dr. Nicholes Mango in the heart failure clinic and her next echocardiogram is scheduled for September 17, 2023.  Her most recent restaging scan occurred on May 15, 2023 demonstrating unchanged sclerotic  osseous metastasis, unchanged prominent pretracheal lymph nodes, no evidence of soft tissue metastatic disease in the chest abdomen pelvis, diffuse colonic mucosal thickening hyperenhancement and vascular coming conspicuous in the distal colon consistent with nonspecific infectious or inflammatory colitis and in  keeping with the patient report of diarrhea, sigmoid diverticulosis which does not appear to be the nidus of inflammatory findings described above.  He was seen by GI based on these findings and underwent stool testing that demonstrated C. difficile requiring treatment with vancomycin and probiotics.  She has follow-up this week for colonoscopy.   Allison Pierce reports not having any further diarrhea.  She has also been working hard at home to clean everything extensively to eradicate the c. Diff.  She has been increasingly fatigued and wonders if there is a lab related cause.     Patient Active Problem List   Diagnosis Date Noted   Metastasis to bone (HCC) 06/12/2023   Shingles 05/15/2022   Colitis 05/13/2022   Alcohol abuse 05/12/2022   Gastroesophageal reflux disease 05/12/2022   Irritable bowel syndrome 05/12/2022   Mild cognitive impairment 05/12/2022   Urinary incontinence 05/12/2022   Allergic rhinitis 05/12/2022   Recurrent major depression in remission (HCC) 05/12/2022   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HCC) 05/12/2022   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   Acute colitis 05/12/2022   Lactic acidosis 05/12/2022   Chemotherapy induced neutropenia (HCC) 05/12/2022   Chronic diastolic CHF (congestive heart failure) (HCC) 05/12/2022   Oral mucositis 05/12/2022   Port-A-Cath in place 05/05/2022   Genetic testing 03/06/2022   Family history of breast cancer 02/23/2022   Family history of prostate cancer 02/23/2022   Essential hypertension 02/22/2022   Malignant neoplasm of upper-outer quadrant of left breast in female, estrogen receptor negative (HCC) 02/20/2022   Malignant neoplasm of upper-inner quadrant of left female breast (HCC) 02/20/2022   Right-sided epistaxis 07/19/2017   Anxiety state 01/24/2013   Arthropathy 01/24/2013   Depressive disorder, not elsewhere classified 01/24/2013   Mixed hyperlipidemia 01/24/2013    is allergic to oxybutynin, prozac [fluoxetine hcl], ritalin  [methylphenidate], rosuvastatin, and lyrica [pregabalin].  MEDICAL HISTORY: Past Medical History:  Diagnosis Date   Anxiety    C. difficile diarrhea    Depression    GERD (gastroesophageal reflux disease) OCCASIONALLY  TAKE TUMS   Human papilloma virus    Hyperlipidemia    Hypertension    left breast ca 12/2021   UC (ulcerative colitis) (HCC)    UTI (urinary tract infection)    Vulvar lesion     SURGICAL HISTORY: Past Surgical History:  Procedure Laterality Date   ABDOMINAL HYSTERECTOMY  1992   W/ SALPINGO-OOPHORECTOMY   ANTERIOR CERVICAL DECOMP/DISCECTOMY FUSION  01-18-2009  DR POOLE   C4  - C6   AUGMENTATION MAMMAPLASTY Bilateral    BREAST BIOPSY Right    BREAST BIOPSY Left 02/10/2022   x2   BREAST ENHANCEMENT SURGERY  2011   BREAST EXCISIONAL BIOPSY Left    BREAST RECONSTRUCTION WITH PLACEMENT OF TISSUE EXPANDER AND FLEX HD (ACELLULAR HYDRATED DERMIS) Left 04/07/2022   Procedure: BREAST RECONSTRUCTION WITH PLACEMENT OF TISSUE EXPANDER AND FLEX HD (ACELLULAR HYDRATED DERMIS);  Surgeon: Allena Napoleon, MD;  Location: [facility] OR;  Service: Plastics;  Laterality: Left;   BREAST SURGERY  02-04-2011  dr Jamey Ripa   EXCISION LEFT BREAST MASS--  CALCIFICATION   EYE SURGERY Bilateral    cataract removal   MASTECTOMY W/ SENTINEL NODE BIOPSY Left 04/07/2022   Procedure: LEFT MASTECTOMY WITH SENTINEL NODE BIOPSY;  Surgeon: Abigail Miyamoto, MD;  Location: [facility] OR;  Service: General;  Laterality: Left;  LMA   PORTACATH PLACEMENT N/A 04/07/2022   Procedure: PORT-A-CATH INSERTION WITH ULTRASOUND GUIDANCE;  Surgeon: Abigail Miyamoto, MD;  Location: [facility] OR;  Service: General;  Laterality: N/A;   REMOVAL OF TISSUE EXPANDER AND PLACEMENT OF IMPLANT Left 03/16/2023   Procedure: Removal of left breast tissue expander and placement of permanent implant;  Surgeon: [name], MD;  Location: [facility];  Service: Plastics;  Laterality: Left;   VULVAR LESION REMOVAL  09/10/2012    Procedure: VULVAR LESION;  Surgeon: Gretta Cool, MD;  Location: [facility];  Service: Gynecology;  Laterality: N/A;  WIDE EXCISION OF VULVAR LESION    SOCIAL HISTORY: Social History   Socioeconomic History   Marital status: Married    Spouse name: Fayrene Fearing   Number of children: 1   Years of education: 12   Highest education level: Not on file  Occupational History   Occupation: retired  Tobacco Use   Smoking status: Never   Smokeless tobacco: Never  Vaping Use   Vaping status: Never Used  Substance and Sexual Activity   Alcohol use: Yes    Alcohol/week: 7.0 standard drinks of alcohol    Types: 7 Cans of beer per week   Drug use: Never   Sexual activity: Not Currently  Other Topics Concern   Not on file  Social History Narrative   Graduated HS      Right handed      Lives with husband   Social Determinants of Health   Financial Resource Strain: Low Risk  (02/22/2022)   Overall Financial Resource Strain (CARDIA)    Difficulty of Paying Living Expenses: Not hard at all  Food Insecurity: No Food Insecurity (02/22/2022)   Hunger Vital Sign    Worried About Running Out of Food in the Last Year: Never true    Ran Out of Food in the Last Year: Never true  Transportation Needs: No Transportation Needs (02/22/2022)   PRAPARE - Administrator, Civil Service (Medical): No    Lack of Transportation (Non-Medical): No  Physical Activity: Not on file  Stress: Not on file  Social Connections: Not on file  Intimate Partner Violence: Not on file    FAMILY HISTORY: Family History  Problem Relation Age of Onset   Lung cancer Mother 43       she smoked   Prostate cancer Brother 75   Breast cancer Cousin        paternal first cousin   Breast cancer Cousin        paternal first cousin   Liver disease Neg Hx    Esophageal cancer Neg Hx    Colon cancer Neg Hx     Review of Systems  Constitutional:  Positive for fatigue. Negative for appetite change,  chills, fever and unexpected weight change.  HENT:   Negative for hearing loss, lump/mass and trouble swallowing.   Eyes:  Negative for eye problems and icterus.  Respiratory:  Negative for chest tightness, cough and shortness of breath.   Cardiovascular:  Negative for chest pain, leg swelling and palpitations.  Gastrointestinal:  Negative for abdominal distention, abdominal pain, constipation, diarrhea, nausea and vomiting.  Endocrine: Negative for hot flashes.  Genitourinary:  Negative for difficulty urinating.   Musculoskeletal:  Positive for gait problem. Negative for arthralgias.  Skin:  Negative for itching and rash.  Neurological:  Positive for gait  problem. Negative for dizziness, extremity weakness, headaches and numbness.  Hematological:  Negative for adenopathy. Does not bruise/bleed easily.  Psychiatric/Behavioral:  Negative for depression. The patient is not nervous/anxious.       PHYSICAL EXAMINATION    Vitals:   07/23/23 1032  BP: (!) 149/59  Pulse: 80  Resp: 18  Temp: 98.1 F (36.7 C)  SpO2: 100%    Physical Exam Constitutional:      General: She is not in acute distress.    Appearance: Normal appearance. She is not toxic-appearing.  HENT:     Head: Normocephalic and atraumatic.     Mouth/Throat:     Mouth: Mucous membranes are moist.     Pharynx: Oropharynx is clear. No oropharyngeal exudate or posterior oropharyngeal erythema.  Eyes:     General: No scleral icterus. Cardiovascular:     Rate and Rhythm: Normal rate and regular rhythm.     Pulses: Normal pulses.     Heart sounds: Normal heart sounds.  Pulmonary:     Effort: Pulmonary effort is normal.     Breath sounds: Normal breath sounds.  Abdominal:     General: Abdomen is flat. Bowel sounds are normal. There is no distension.     Palpations: Abdomen is soft.     Tenderness: There is no abdominal tenderness.  Musculoskeletal:        General: No swelling.     Cervical back: Neck supple.   Lymphadenopathy:     Cervical: No cervical adenopathy.  Skin:    General: Skin is warm and dry.     Findings: No rash.  Neurological:     General: No focal deficit present.     Mental Status: She is alert.  Psychiatric:        Mood and Affect: Mood normal.        Behavior: Behavior normal.     LABORATORY DATA:  CBC    Component Value Date/Time   WBC 6.4 06/25/2023 1020   RBC 4.19 06/25/2023 1020   HGB 11.4 (L) 06/25/2023 1020   HGB 10.8 (L) 05/10/2023 1015   HCT 35.5 (L) 06/25/2023 1020   PLT 280.0 06/25/2023 1020   PLT 240 05/10/2023 1015   MCV 84.6 06/25/2023 1020   MCH 27.9 05/10/2023 1015   MCHC 32.1 06/25/2023 1020   RDW 14.6 06/25/2023 1020   LYMPHSABS 1.1 06/25/2023 1020   MONOABS 0.5 06/25/2023 1020   EOSABS 0.2 06/25/2023 1020   BASOSABS 0.0 06/25/2023 1020    CMP     Component Value Date/Time   NA 140 06/25/2023 1020   K 3.7 06/25/2023 1020   CL 104 06/25/2023 1020   CO2 30 06/25/2023 1020   GLUCOSE 93 06/25/2023 1020   BUN 8 06/25/2023 1020   CREATININE 1.02 06/25/2023 1020   CREATININE 0.94 05/10/2023 1015   CALCIUM 9.1 06/25/2023 1020   PROT 6.3 06/25/2023 1020   ALBUMIN 4.0 06/25/2023 1020   AST 19 06/25/2023 1020   AST 19 05/10/2023 1015   ALT 16 06/25/2023 1020   ALT 15 05/10/2023 1015   ALKPHOS 76 06/25/2023 1020   BILITOT 0.7 06/25/2023 1020   BILITOT 0.5 05/10/2023 1015   GFRNONAA >60 05/10/2023 1015   GFRAA  01/13/2009 1007    >60        The eGFR has been calculated using the MDRD equation. This calculation has not been validated in all clinical situations. eGFR's persistently <60 mL/min signify possible Chronic Kidney Disease.  ASSESSMENT and THERAPY PLAN:   Malignant neoplasm of upper-outer quadrant of left breast in female, estrogen receptor negative (HCC) 04/07/2022:Left mastectomy: 4.2 cm invasive pleomorphic lobular carcinoma grade 2, LCIS, 5/5 lymph nodes positive with extranodal extension, margins negative,  ER 0%, PR 0%, HER2 3+, Ki-67 20%   CT CAP 04/26/2022: Lytic osseous lesions throughout body, right seventh rib, multiple thoracic vertebral bodies, sacrum.  T4 vertebral body Bone scan 04/25/2022: Abnormal uptake in the sternum, right parietal region of calvarium, right posterior seventh rib, T5, left sternoclavicular joint 07/30/2022: Bone scan: Interval resolution/decreasing accumulation of multiple bone metastases 10/30/2022: CT CAP: Mildly prominent mediastinal lymph nodes are similar, no pulmonary metastasis, skeletal lesions increasing sclerosis, no visceral metastases 01/16/2023: CT CAP: Stable prominent mediastinal lymph nodes, slight increase in the small nodes in the mesentery could be reactive, scattered bone metastases stable 05/16/2023: CT CAP: Stable bone metastases, unchanged pretracheal lymph nodes.  Previous mesenteric lymph nodes not conspicuous.  Diffuse colonic mucosal thickening: Colitis   Treatment: Palliative chemotherapy with Taxotere [name] and Perjeta every 3 weeks x1 cycle given 05/05/2022 (discontinued for severe toxicities and hospitalization)   ---------------------------------------------------------------------------------------------------------------------------------------------- Chemotoxicities: Hospitalization 05/11/2022-05/18/2022: Convulsive syncope, chemo induced neutropenia, oral mucositis, chronic CHF, severe diarrhea with dehydration ECHO 06/26/22: EF 55-60% (follows with Bensimhon)   Treatment plan:05/26/22 [name]. added Perjeta 06/21/22-discontinued 06/2023 secondary to colitis   Fatigue: Commended management with energy conservation.  I have requested additional labs with CBC, c-Met, vitamin D level, and iron studies to be drawn today. Colitis: She is following up with GI about this.  Previously treated for C. difficile and the diarrhea has resolved. Balance changes: MRI of the brain with and without contrast has been ordered.  I gave the patient the phone  number to schedule this.  She is following with Dr. Gala Romney for repeat echocardiograms.  We will see her back in 3 weeks for follow-up, and her next treatment   All questions were answered. The patient knows to call the clinic with any problems, questions or concerns. We can certainly see the patient much sooner if necessary.  Total encounter time:30 minutes*in face-to-face visit time, chart review, lab review, care coordination, order entry, and documentation of the encounter time.    Lillard Anes, NP 07/23/23 10:59 AM Medical Oncology and Hematology [facility] [address] [place], [place] [address] Tel. [phone]    Fax. [phone]  *Total Encounter Time as defined by the Centers for Medicare and Medicaid Services includes, in addition to the face-to-face time of a patient visit (documented in the note above) non-face-to-face time: obtaining and reviewing outside history, ordering and reviewing medications, tests or procedures, care coordination (communications with other health care professionals or caregivers) and documentation in the medical record.

## 2023-07-23 NOTE — Assessment & Plan Note (Signed)
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  T4 vertebral body Bone scan 04/25/2022: Abnormal uptake in the sternum, right parietal region of calvarium, right posterior seventh rib, T5, left sternoclavicular joint 07/30/2022: Bone scan: Interval resolution/decreasing accumulation of multiple bone metastases 10/30/2022: CT CAP: Mildly prominent mediastinal lymph nodes are similar, no pulmonary metastasis, skeletal lesions increasing sclerosis, no visceral metastases 01/16/2023: CT CAP: Stable prominent mediastinal lymph nodes, slight increase in the small nodes in the mesentery could be reactive, scattered bone metastases stable 05/16/2023: CT CAP: Stable bone metastases, unchanged pretracheal lymph nodes.  Previous mesenteric lymph nodes not conspicuous.  Diffuse colonic mucosal thickening: Colitis   Treatment: Palliative chemotherapy with Taxotere [name] and Perjeta every 3 weeks x1 cycle given 05/05/2022 (discontinued for severe toxicities and hospitalization)   ---------------------------------------------------------------------------------------------------------------------------------------------- Chemotoxicities: Hospitalization 05/11/2022-05/18/2022: Convulsive syncope, chemo induced neutropenia, oral mucositis, chronic CHF, severe diarrhea with dehydration ECHO 06/26/22: EF 55-60% (follows with Bensimhon)   Treatment plan:05/26/22 [name]. added Perjeta 06/21/22-discontinued 06/2023 secondary to colitis   Fatigue: Commended management with energy conservation.  I have requested additional labs with CBC, c-Met, vitamin D level, and iron studies to be drawn today. Colitis: She is following up with GI about this.  Previously treated  for C. difficile and the diarrhea has resolved. Balance changes: MRI of the brain with and without contrast has been ordered.  I gave the patient the phone number to schedule this.  She is following with Dr. Gala Romney for repeat echocardiograms.  We will see her back in 3 weeks for follow-up, and her next treatment

## 2023-07-23 NOTE — Patient Instructions (Signed)
[facility]  Discharge Instructions: Thank you for choosing [facility] to provide your oncology and hematology care.   If you have a lab appointment with the Cancer Center, please go directly to the Cancer Center and check in at the registration area.   Wear comfortable clothing and clothing appropriate for easy access to any Portacath or PICC line.   We strive to give you quality time with your provider. You may need to reschedule your appointment if you arrive late (15 or more minutes).  Arriving late affects you and other patients whose appointments are after yours.  Also, if you miss three or more appointments without notifying the office, you may be dismissed from the clinic at the provider's discretion.      For prescription refill requests, have your pharmacy contact our office and allow 72 hours for refills to be completed.    Today you received the following chemotherapy and/or immunotherapy agents herceptin      To help prevent nausea and vomiting after your treatment, we encourage you to take your nausea medication as directed.  BELOW ARE SYMPTOMS THAT SHOULD BE REPORTED IMMEDIATELY: *FEVER GREATER THAN 100.4 F (38 C) OR HIGHER *CHILLS OR SWEATING *NAUSEA AND VOMITING THAT IS NOT CONTROLLED WITH YOUR NAUSEA MEDICATION *UNUSUAL SHORTNESS OF BREATH *UNUSUAL BRUISING OR BLEEDING *URINARY PROBLEMS (pain or burning when urinating, or frequent urination) *BOWEL PROBLEMS (unusual diarrhea, constipation, pain near the anus) TENDERNESS IN MOUTH AND THROAT WITH OR WITHOUT PRESENCE OF ULCERS (sore throat, sores in mouth, or a toothache) UNUSUAL RASH, SWELLING OR PAIN  UNUSUAL VAGINAL DISCHARGE OR ITCHING   Items with * indicate a potential emergency and should be followed up as soon as possible or go to the Emergency Department if any problems should occur.  Please show the CHEMOTHERAPY ALERT CARD or IMMUNOTHERAPY ALERT CARD at  check-in to the Emergency Department and triage nurse.  Should you have questions after your visit or need to cancel or reschedule your appointment, please contact [facility]  Dept: [phone]  and follow the prompts.  Office hours are 8:00 a.m. to 4:30 p.m. Monday - Friday. Please note that voicemails left after 4:00 p.m. may not be returned until the following business day.  We are closed weekends and major holidays. You have access to a nurse at all times for urgent questions. Please call the main number to the clinic Dept: [phone] and follow the prompts.   For any non-urgent questions, you may also contact your provider using MyChart. We now offer e-Visits for anyone 18 and older to request care online for non-urgent symptoms. For details visit [email or web address].   Also download the MyChart app! Go to the app store, search "MyChart", open the app, select [facility], and log in with your MyChart username and password.   

## 2023-07-23 NOTE — Progress Notes (Signed)
Per Lillard Anes, NP,, no need to wait for lab results, may proceed with treatment

## 2023-07-24 ENCOUNTER — Encounter: Payer: Self-pay | Admitting: Hematology and Oncology

## 2023-07-26 ENCOUNTER — Inpatient Hospital Stay: Payer: Medicare Other | Admitting: Adult Health

## 2023-07-26 ENCOUNTER — Ambulatory Visit (HOSPITAL_COMMUNITY)
Admission: RE | Admit: 2023-07-26 | Discharge: 2023-07-26 | Disposition: A | Discharge status: Home / Self Care | Payer: Medicare Other | Source: Ambulatory Visit | Attending: Hematology and Oncology | Admitting: Hematology and Oncology

## 2023-07-26 ENCOUNTER — Encounter: Payer: Self-pay | Admitting: Adult Health

## 2023-07-26 DIAGNOSIS — Z171 Estrogen receptor negative status [ER-]: Secondary | ICD-10-CM | POA: Diagnosis not present

## 2023-07-26 DIAGNOSIS — E236 Other disorders of pituitary gland: Secondary | ICD-10-CM | POA: Diagnosis not present

## 2023-07-26 DIAGNOSIS — G319 Degenerative disease of nervous system, unspecified: Secondary | ICD-10-CM | POA: Diagnosis not present

## 2023-07-26 DIAGNOSIS — C50412 Malignant neoplasm of upper-outer quadrant of left female breast: Secondary | ICD-10-CM | POA: Diagnosis not present

## 2023-07-26 DIAGNOSIS — I6782 Cerebral ischemia: Secondary | ICD-10-CM | POA: Diagnosis not present

## 2023-07-26 MED ORDER — GADOBUTROL 1 MMOL/ML IV SOLN
7.0000 mL | Freq: Once | INTRAVENOUS | Status: AC | PRN
  Administered 2023-07-26: 7 mL via INTRAVENOUS

## 2023-07-26 NOTE — Progress Notes (Signed)
[facility] Cancer Follow up:    Allison Raider, MD [address] [place] [address]   DIAGNOSIS:  Cancer Staging  Malignant neoplasm of upper-outer quadrant of left breast in female, estrogen receptor negative (HCC) Staging form: Breast, AJCC 8th Edition - Clinical: Stage IA (cT1c, cN0, cM0, G2, ER-, PR-, HER2+) - Signed by Serena Croissant, MD on 02/22/2022 Stage prefix: Initial diagnosis Histologic grading system: 3 grade system - Pathologic: Stage IIIA (pT2, pN2, cM0, G2, ER-, PR-, HER2+) - Signed by Serena Croissant, MD on 04/27/2022 Histologic grading system: 3 grade system - Pathologic: Stage IV (cM1) - Signed by [name], NP on 06/12/2023  I connected with Allison Pierce  on 07/26/23 at  1:30 PM EDT by telephone and verified that I am speaking with the correct person using two identifiers.  I discussed the limitations, risks, security and privacy concerns of performing an evaluation and management service by telephone and the availability of in person appointments.  I also discussed with the patient that there may be a patient responsible charge related to this service. The patient expressed understanding and agreed to proceed.   Patient location: Home Provider location:  cancer Center office  SUMMARY OF ONCOLOGIC HISTORY: Oncology History  Malignant neoplasm of upper-outer quadrant of left breast in female, estrogen receptor negative (HCC)  02/10/2022 Initial Diagnosis   Palpable left breast masses and calcifications: 1.8 cm at 1:00 and 1.7 cm at 9:00, calcifications not biopsy.  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 Report date is 03/05/2022.  The CustomNext-Cancer+RNAinsight panel offered by [facility] includes sequencing and rearrangement analysis for the following 48 genes:  APC, ATM, AXIN2, BARD1, BMPR1A, BRCA1, BRCA2, BRIP1, CDH1, CDK4, CDKN2A, CHEK2, CTNNA1, DICER1, EGFR, EPCAM, GREM1, HOXB13, KIT, MEN1, MLH1, MSH2, MSH3, MSH6, MUTYH, NBN, NF1, NTHL1, PALB2, PDGFRA, PMS2, POLD1, POLE, PTEN, RAD50, RAD51C, RAD51D, SDHA, SDHB, SDHC, SDHD, SMAD4, SMARCA4, STK11, TP53, TSC1, TSC2, and VHL.  RNA data is routinely analyzed for use in variant interpretation for all genes.   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, LCIS, 5/5 lymph nodes positive with extranodal extension, margins negative ER 0%, PR 0%, HER2 3+, Ki-67 20%   04/27/2022 Cancer Staging   Staging form: Breast, AJCC 8th Edition - Pathologic: Stage IIIA (pT2, pN2, cM0, G2, ER-, PR-, HER2+) - Signed by Serena Croissant, MD on 04/27/2022 Histologic grading system: 3 grade system   05/05/2022 - 05/26/2022 Chemotherapy   Patient is on Treatment Plan : BREAST DOCEtaxel + Trastuzumab + Pertuzumab (THP) q21d x 8 cycles / Trastuzumab + Pertuzumab q21d x 4 cycles     05/26/2022 -  Chemotherapy   Patient is on Treatment Plan : BREAST MAINTENANCE Trastuzumab IV (6) or SQ (600) D1 q21d X 11 Cycles     06/12/2023 Cancer Staging   Staging form: Breast, AJCC 8th Edition - Pathologic: Stage IV (cM1) - Signed by [name], NP on 06/12/2023     CURRENT THERAPY: [name]  INTERVAL HISTORY: Allison Pierce 77 y.o.  female returns recently underwent an MRI due to feeling imbalanced. The results revealed a small, 10mm lesion on the pituitary gland. The nature of the lesion is currently unclear, with possibilities ranging from a benign pituitary gland tumor to a metastatic lesion from a primary  breast cancer. The patient expressed concern and confusion about the findings, seeking further clarification and next steps.  The MRI did not demonstrate any metastatic disease elsewhere.   Patient Active Problem List   Diagnosis Date Noted   Metastasis to bone (HCC) 06/12/2023   Shingles 05/15/2022   Colitis 05/13/2022   Alcohol abuse 05/12/2022   Gastroesophageal reflux disease 05/12/2022   Irritable bowel syndrome 05/12/2022   Mild cognitive impairment 05/12/2022   Urinary incontinence 05/12/2022   Allergic rhinitis 05/12/2022   Recurrent major depression in remission (HCC) 05/12/2022   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HCC) 05/12/2022   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   Acute colitis 05/12/2022   Lactic acidosis 05/12/2022   Chemotherapy induced neutropenia (HCC) 05/12/2022   Chronic diastolic CHF (congestive heart failure) (HCC) 05/12/2022   Oral mucositis 05/12/2022   Port-A-Cath in place 05/05/2022   Genetic testing 03/06/2022   Family history of breast cancer 02/23/2022   Family history of prostate cancer 02/23/2022   Essential hypertension 02/22/2022   Malignant neoplasm of upper-outer quadrant of left breast in female, estrogen receptor negative (HCC) 02/20/2022   Malignant neoplasm of upper-inner quadrant of left female breast (HCC) 02/20/2022   Right-sided epistaxis 07/19/2017   Anxiety state 01/24/2013   Arthropathy 01/24/2013   Depressive disorder, not elsewhere classified 01/24/2013   Mixed hyperlipidemia 01/24/2013    is allergic to oxybutynin, prozac [fluoxetine hcl], ritalin [methylphenidate], rosuvastatin, and lyrica [pregabalin].  MEDICAL HISTORY: Past Medical History:  Diagnosis Date   Anxiety    C. difficile diarrhea    Depression    GERD (gastroesophageal reflux disease) OCCASIONALLY  TAKE TUMS   Human papilloma virus    Hyperlipidemia    Hypertension    left breast ca 12/2021   UC (ulcerative colitis) (HCC)    UTI (urinary tract infection)     Vulvar lesion     SURGICAL HISTORY: Past Surgical History:  Procedure Laterality Date   ABDOMINAL HYSTERECTOMY  1992   W/ SALPINGO-OOPHORECTOMY   ANTERIOR CERVICAL DECOMP/DISCECTOMY FUSION  01-18-2009  DR POOLE   C4  - C6   AUGMENTATION MAMMAPLASTY Bilateral    BREAST BIOPSY Right    BREAST BIOPSY Left 02/10/2022   x2   BREAST ENHANCEMENT SURGERY  2011   BREAST EXCISIONAL BIOPSY Left    BREAST RECONSTRUCTION WITH PLACEMENT OF TISSUE EXPANDER AND FLEX HD (ACELLULAR HYDRATED DERMIS) Left 04/07/2022   Procedure: BREAST RECONSTRUCTION WITH PLACEMENT OF TISSUE EXPANDER AND FLEX HD (ACELLULAR HYDRATED DERMIS);  Surgeon: Allena Napoleon, MD;  Location: [facility] OR;  Service: Plastics;  Laterality: Left;   BREAST SURGERY  02-04-2011  dr Jamey Ripa   EXCISION LEFT BREAST MASS--  CALCIFICATION   EYE SURGERY Bilateral    cataract removal   MASTECTOMY W/ SENTINEL NODE BIOPSY Left 04/07/2022   Procedure: LEFT MASTECTOMY WITH SENTINEL NODE BIOPSY;  Surgeon: Abigail Miyamoto, MD;  Location[facility] OR;  Service: General;  Laterality: Left;  LMA   PORTACATH PLACEMENT N/A 04/07/2022   Procedure: PORT-A-CATH INSERTION WITH ULTRASOUND GUIDANCE;  Surgeon: Abigail Miyamoto, MD;  Location: [facility] OR;  Service: General;  Laterality: N/A;   REMOVAL OF TISSUE EXPANDER AND PLACEMENT OF IMPLANT Left 03/16/2023   Procedure: Removal of left breast tissue expander  and placement of permanent implant;  Surgeon: [name], MD;  Location: [facility];  Service: Plastics;  Laterality: Left;   VULVAR LESION REMOVAL  09/10/2012   Procedure: VULVAR LESION;  Surgeon: Gretta Cool, MD;  Location: [facility];  Service: Gynecology;  Laterality: N/A;  WIDE EXCISION OF VULVAR LESION    SOCIAL HISTORY: Social History   Socioeconomic History   Marital status: Married    Spouse name: Fayrene Fearing   Number of children: 1   Years of education: 12   Highest education level: Not on file  Occupational History    Occupation: retired  Tobacco Use   Smoking status: Never   Smokeless tobacco: Never  Vaping Use   Vaping status: Never Used  Substance and Sexual Activity   Alcohol use: Yes    Alcohol/week: 7.0 standard drinks of alcohol    Types: 7 Cans of beer per week   Drug use: Never   Sexual activity: Not Currently  Other Topics Concern   Not on file  Social History Narrative   Graduated HS      Right handed      Lives with husband   Social Determinants of Health   Financial Resource Strain: Low Risk  (02/22/2022)   Overall Financial Resource Strain (CARDIA)    Difficulty of Paying Living Expenses: Not hard at all  Food Insecurity: No Food Insecurity (02/22/2022)   Hunger Vital Sign    Worried About Running Out of Food in the Last Year: Never true    Ran Out of Food in the Last Year: Never true  Transportation Needs: No Transportation Needs (02/22/2022)   PRAPARE - Administrator, Civil Service (Medical): No    Lack of Transportation (Non-Medical): No  Physical Activity: Not on file  Stress: Not on file  Social Connections: Not on file  Intimate Partner Violence: Not on file    FAMILY HISTORY: Family History  Problem Relation Age of Onset   Lung cancer Mother 66       she smoked   Prostate cancer Brother 13   Breast cancer Cousin        paternal first cousin   Breast cancer Cousin        paternal first cousin   Liver disease Neg Hx    Esophageal cancer Neg Hx    Colon cancer Neg Hx     Review of Systems  Constitutional:  Negative for appetite change, chills, fatigue, fever and unexpected weight change.  HENT:   Negative for hearing loss, lump/mass and trouble swallowing.   Eyes:  Negative for eye problems and icterus.  Respiratory:  Negative for chest tightness, cough and shortness of breath.   Cardiovascular:  Negative for chest pain, leg swelling and palpitations.  Gastrointestinal:  Negative for abdominal distention, abdominal pain, constipation,  diarrhea, nausea and vomiting.  Endocrine: Negative for hot flashes.  Genitourinary:  Negative for difficulty urinating.   Musculoskeletal:  Negative for arthralgias.  Skin:  Negative for itching and rash.  Neurological:  Negative for dizziness, extremity weakness, headaches and numbness.  Hematological:  Negative for adenopathy. Does not bruise/bleed easily.  Psychiatric/Behavioral:  Negative for depression. The patient is not nervous/anxious.       PHYSICAL EXAMINATION  Patient sounds well in no apparent distress.    ASSESSMENT and THERAPY PLAN:   Malignant neoplasm of upper-outer quadrant of left breast in female, estrogen receptor negative (HCC) 04/07/2022:Left mastectomy: 4.2 cm invasive pleomorphic lobular  carcinoma grade 2, LCIS, 5/5 lymph nodes positive with extranodal extension, margins negative, ER 0%, PR 0%, HER2 3+, Ki-67 20%   CT CAP 04/26/2022: Lytic osseous lesions throughout body, right seventh rib, multiple thoracic vertebral bodies, sacrum.  T4 vertebral body Bone scan 04/25/2022: Abnormal uptake in the sternum, right parietal region of calvarium, right posterior seventh rib, T5, left sternoclavicular joint 07/30/2022: Bone scan: Interval resolution/decreasing accumulation of multiple bone metastases 10/30/2022: CT CAP: Mildly prominent mediastinal lymph nodes are similar, no pulmonary metastasis, skeletal lesions increasing sclerosis, no visceral metastases 01/16/2023: CT CAP: Stable prominent mediastinal lymph nodes, slight increase in the small nodes in the mesentery could be reactive, scattered bone metastases stable 05/16/2023: CT CAP: Stable bone metastases, unchanged pretracheal lymph nodes.  Previous mesenteric lymph nodes not conspicuous.  Diffuse colonic mucosal thickening: Colitis   Treatment: Palliative chemotherapy with Taxotere [name] and Perjeta every 3 weeks x1 cycle given 05/05/2022 (discontinued for severe toxicities and hospitalization)    ---------------------------------------------------------------------------------------------------------------------------------------------- Chemotoxicities: Hospitalization 05/11/2022-05/18/2022: Convulsive syncope, chemo induced neutropenia, oral mucositis, chronic CHF, severe diarrhea with dehydration ECHO 06/26/22: EF 55-60% (follows with [facility])   Treatment plan:05/26/22 [name]. added Perjeta 06/21/22-discontinued 06/2023 secondary to colitis   Pituitary Lesion 10mm lesion identified on recent MRI. Unclear etiology, could be benign pituitary adenoma or metastatic breast cancer. No other brain lesions identified. -Referred to neuro-oncologist, Dr. Arsenio Loader, for further evaluation and management. -Brain tumor board to discuss imaging and make recommendations. -Expect a call from the nurse with further information and next steps, along with a timeline within the next 1-2 days.  Balance Changes Likely unrelated to pituitary lesion.     The patient's primary oncologist, Dr. Georgiann Mohs, is currently out of town, but has been kept informed of the patient's condition and recent imaging results.  Follow up instructions:    -Results sent to Dr. Barbaraann Cao, awaiting recommendations  The patient was provided an opportunity to ask questions and all were answered. The patient agreed with the plan and demonstrated an understanding of the instructions.   The patient was advised to call back or seek an in-person evaluation if the symptoms worsen or if the condition fails to improve as anticipated.   I provided 10 minutes of non face-to-face telephone visit time during this encounter, and > 50% was spent counseling as documented under my assessment & plan.   Lillard Anes, NP 07/26/23 1:53 PM Medical Oncology and Hematology [facility] [address] [place], [place] [address] Tel. [phone]    Fax. [phone]  *Total Encounter Time as defined by the Centers for Medicare and  Medicaid Services includes, in addition to the face-to-face time of a patient visit (documented in the note above) non-face-to-face time: obtaining and reviewing outside history, ordering and reviewing medications, tests or procedures, care coordination (communications with other health care professionals or caregivers) and documentation in the medical record.

## 2023-07-26 NOTE — Assessment & Plan Note (Signed)
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  T4 vertebral body Bone scan 04/25/2022: Abnormal uptake in the sternum, right parietal region of calvarium, right posterior seventh rib, T5, left sternoclavicular joint 07/30/2022: Bone scan: Interval resolution/decreasing accumulation of multiple bone metastases 10/30/2022: CT CAP: Mildly prominent mediastinal lymph nodes are similar, no pulmonary metastasis, skeletal lesions increasing sclerosis, no visceral metastases 01/16/2023: CT CAP: Stable prominent mediastinal lymph nodes, slight increase in the small nodes in the mesentery could be reactive, scattered bone metastases stable 05/16/2023: CT CAP: Stable bone metastases, unchanged pretracheal lymph nodes.  Previous mesenteric lymph nodes not conspicuous.  Diffuse colonic mucosal thickening: Colitis   Treatment: Palliative chemotherapy with Taxotere [name] and Perjeta every 3 weeks x1 cycle given 05/05/2022 (discontinued for severe toxicities and hospitalization)   ---------------------------------------------------------------------------------------------------------------------------------------------- Chemotoxicities: Hospitalization 05/11/2022-05/18/2022: Convulsive syncope, chemo induced neutropenia, oral mucositis, chronic CHF, severe diarrhea with dehydration ECHO 06/26/22: EF 55-60% (follows with [facility])   Treatment plan:05/26/22 [name]. added Perjeta 06/21/22-discontinued 06/2023 secondary to colitis   Pituitary Lesion 10mm lesion identified on recent MRI. Unclear etiology, could be benign pituitary adenoma or metastatic breast cancer. No other brain lesions identified. -Referred to neuro-oncologist, Dr. Arsenio Loader, for further  evaluation and management. -Brain tumor board to discuss imaging and make recommendations. -Expect a call from the nurse with further information and next steps, along with a timeline within the next 1-2 days.  Balance Changes Likely unrelated to pituitary lesion.     The patient's primary oncologist, Dr. Georgiann Mohs, is currently out of town, but has been kept informed of the patient's condition and recent imaging results.

## 2023-07-27 ENCOUNTER — Telehealth: Payer: Self-pay | Admitting: Adult Health

## 2023-07-27 ENCOUNTER — Inpatient Hospital Stay: Payer: Medicare Other | Admitting: Adult Health

## 2023-07-27 ENCOUNTER — Other Ambulatory Visit: Payer: Self-pay | Admitting: Adult Health

## 2023-07-27 NOTE — Telephone Encounter (Signed)
 Left patient a vm regarding upcoming appointment

## 2023-07-30 ENCOUNTER — Other Ambulatory Visit: Payer: Self-pay | Admitting: Radiation Therapy

## 2023-07-30 ENCOUNTER — Encounter: Payer: Self-pay | Admitting: Gastroenterology

## 2023-07-30 ENCOUNTER — Encounter: Payer: Medicare Other | Admitting: Gastroenterology

## 2023-07-31 ENCOUNTER — Other Ambulatory Visit (HOSPITAL_COMMUNITY): Payer: Self-pay

## 2023-07-31 ENCOUNTER — Inpatient Hospital Stay: Payer: Medicare Other

## 2023-07-31 ENCOUNTER — Telehealth: Payer: Self-pay | Admitting: Pharmacy Technician

## 2023-07-31 ENCOUNTER — Other Ambulatory Visit: Payer: Self-pay | Admitting: *Deleted

## 2023-07-31 DIAGNOSIS — R5383 Other fatigue: Secondary | ICD-10-CM | POA: Diagnosis not present

## 2023-07-31 DIAGNOSIS — Z5112 Encounter for antineoplastic immunotherapy: Secondary | ICD-10-CM | POA: Diagnosis not present

## 2023-07-31 DIAGNOSIS — Z171 Estrogen receptor negative status [ER-]: Secondary | ICD-10-CM | POA: Diagnosis not present

## 2023-07-31 DIAGNOSIS — C50412 Malignant neoplasm of upper-outer quadrant of left female breast: Secondary | ICD-10-CM

## 2023-07-31 DIAGNOSIS — C7951 Secondary malignant neoplasm of bone: Secondary | ICD-10-CM | POA: Diagnosis not present

## 2023-07-31 DIAGNOSIS — Z95828 Presence of other vascular implants and grafts: Secondary | ICD-10-CM

## 2023-07-31 DIAGNOSIS — Z9012 Acquired absence of left breast and nipple: Secondary | ICD-10-CM | POA: Diagnosis not present

## 2023-07-31 LAB — TSH: TSH: 1.844 u[IU]/mL (ref 0.350–4.500)

## 2023-07-31 MED ORDER — SODIUM CHLORIDE 0.9% FLUSH
10.0000 mL | Freq: Once | INTRAVENOUS | Status: AC
  Administered 2023-07-31: 10 mL

## 2023-07-31 MED ORDER — HEPARIN SOD (PORK) LOCK FLUSH 100 UNIT/ML IV SOLN
500.0000 [IU] | Freq: Once | INTRAVENOUS | Status: AC
  Administered 2023-07-31: 500 [IU]

## 2023-07-31 NOTE — Progress Notes (Signed)
Per NP request, lab orders placed for pituitary workup.

## 2023-07-31 NOTE — Telephone Encounter (Signed)
Pharmacy Patient Advocate Encounter   Received notification from CoverMyMeds that prior authorization for VANCOMYCIN 125MG  is required/requested.   Insurance verification completed.   The patient is insured through [employer] .   Per test claim: CANCELLED due to AVAILABLE WITH OUT A PA.  Test billing results with [facility] return a copay of $13.66. Can only fill in #40 count increments.

## 2023-08-01 LAB — FOLLICLE STIMULATING HORMONE: FSH: 55.7 m[IU]/mL (ref 25.8–134.8)

## 2023-08-01 LAB — LUTEINIZING HORMONE: LH: 29.6 m[IU]/mL (ref 7.7–58.5)

## 2023-08-01 LAB — PROLACTIN: Prolactin: 15 ng/mL (ref 3.6–25.2)

## 2023-08-01 LAB — GROWTH HORMONE: Growth Hormone: 0.1 ng/mL (ref 0.0–10.0)

## 2023-08-03 ENCOUNTER — Ambulatory Visit: Payer: Medicare Other | Admitting: Gastroenterology

## 2023-08-03 ENCOUNTER — Encounter: Payer: Self-pay | Admitting: Gastroenterology

## 2023-08-03 ENCOUNTER — Telehealth: Payer: Medicare Other | Admitting: Adult Health

## 2023-08-03 VITALS — BP 143/71 | HR 76 | Temp 98.0°F | Resp 15 | Ht 66.0 in | Wt 150.0 lb

## 2023-08-03 DIAGNOSIS — R197 Diarrhea, unspecified: Secondary | ICD-10-CM

## 2023-08-03 DIAGNOSIS — R9389 Abnormal findings on diagnostic imaging of other specified body structures: Secondary | ICD-10-CM

## 2023-08-03 DIAGNOSIS — K5730 Diverticulosis of large intestine without perforation or abscess without bleeding: Secondary | ICD-10-CM

## 2023-08-03 DIAGNOSIS — B9681 Helicobacter pylori [H. pylori] as the cause of diseases classified elsewhere: Secondary | ICD-10-CM | POA: Diagnosis not present

## 2023-08-03 DIAGNOSIS — K2990 Gastroduodenitis, unspecified, without bleeding: Secondary | ICD-10-CM

## 2023-08-03 DIAGNOSIS — D509 Iron deficiency anemia, unspecified: Secondary | ICD-10-CM | POA: Diagnosis not present

## 2023-08-03 DIAGNOSIS — K2970 Gastritis, unspecified, without bleeding: Secondary | ICD-10-CM | POA: Diagnosis not present

## 2023-08-03 DIAGNOSIS — R933 Abnormal findings on diagnostic imaging of other parts of digestive tract: Secondary | ICD-10-CM | POA: Diagnosis not present

## 2023-08-03 DIAGNOSIS — K449 Diaphragmatic hernia without obstruction or gangrene: Secondary | ICD-10-CM | POA: Diagnosis not present

## 2023-08-03 DIAGNOSIS — K2950 Unspecified chronic gastritis without bleeding: Secondary | ICD-10-CM | POA: Diagnosis not present

## 2023-08-03 DIAGNOSIS — F419 Anxiety disorder, unspecified: Secondary | ICD-10-CM | POA: Diagnosis not present

## 2023-08-03 DIAGNOSIS — F32A Depression, unspecified: Secondary | ICD-10-CM | POA: Diagnosis not present

## 2023-08-03 MED ORDER — PANTOPRAZOLE SODIUM 40 MG PO TBEC: 40.0000 mg | DELAYED_RELEASE_TABLET | Freq: Two times a day (BID) | ORAL | 3 refills | Status: DC

## 2023-08-03 MED ORDER — SODIUM CHLORIDE 0.9 % IV SOLN: 500.0000 mL | INTRAVENOUS | Status: DC

## 2023-08-03 NOTE — Op Note (Signed)
[facility] Patient Name: Allison Pierce Procedure Date: 08/03/2023 3:03 PM MRN: [id number] Endoscopist: Doristine Locks , MD, [id number] Age: 77 Referring MD:  Date of Birth: 12-Jun-1946 Gender: Female Account #: [id number] Procedure:                Upper GI endoscopy Indications:              Iron deficiency anemia, Diarrhea Medicines:                Monitored Anesthesia Care Procedure:                Pre-Anesthesia Assessment:                           - Prior to the procedure, a History and Physical                            was performed, and patient medications and                            allergies were reviewed. The patient's tolerance of                            previous anesthesia was also reviewed. The risks                            and benefits of the procedure and the sedation                            options and risks were discussed with the patient.                            All questions were answered, and informed consent                            was obtained. Prior Anticoagulants: The patient has                            taken no anticoagulant or antiplatelet agents. ASA                            Grade Assessment: II - A patient with mild systemic                            disease. After reviewing the risks and benefits,                            the patient was deemed in satisfactory condition to                            undergo the procedure.                           After obtaining informed consent, the endoscope was  passed under direct vision. Throughout the                            procedure, the patient's blood pressure, pulse, and                            oxygen saturations were monitored continuously. The                            Olympus Scope [id number] was introduced through the                            mouth, and advanced to the third part of duodenum.                            The upper GI  endoscopy was accomplished without                            difficulty. The patient tolerated the procedure                            well. Scope In: Scope Out: Findings:                 The examined esophagus was normal.                           A 1 cm sliding hiatal hernia was present.                           Moderate inflammation characterized by congestion                            (edema) and erythema was found in the gastric                            fundus, in the gastric body and in the gastric                            antrum. Biopsies were taken with a cold forceps for                            Helicobacter pylori testing. Estimated blood loss                            was minimal.                           The examined duodenum was normal. Biopsies were                            taken with a cold forceps for histology. Estimated                            blood loss was minimal. Complications:  No immediate complications. Estimated Blood Loss:     Estimated blood loss was minimal. Impression:               - Normal esophagus.                           - 1 cm hiatal hernia.                           - Gastritis. Biopsied.                           - Normal examined duodenum. Biopsied. Recommendation:           - Patient has a contact number available for                            emergencies. The signs and symptoms of potential                            delayed complications were discussed with the                            patient. Return to normal activities tomorrow.                            Written discharge instructions were provided to the                            patient.                           - Resume previous diet.                           - Await pathology results.                           - Use Protonix (pantoprazole) 40 mg PO BID for 4                            weeks, then reduce to 40 mg daily for mucosal                             healing of gastritis. If no further need for                            chronic acid suppression therapy, can wean off                            pantoprazole completely after 8 weeks. Doristine Locks, MD 08/03/2023 3:43:44 PM

## 2023-08-03 NOTE — Progress Notes (Signed)
Called to room to assist during endoscopic procedure.  Patient ID and intended procedure confirmed with present staff. Received instructions for my participation in the procedure from the performing physician.  

## 2023-08-03 NOTE — Progress Notes (Signed)
Vss nad trans to pacu 

## 2023-08-03 NOTE — Op Note (Signed)
[facility] Patient Name: Allison Pierce Procedure Date: 08/03/2023 3:02 PM MRN: [id number] Endoscopist: Doristine Locks , MD, [id number] Age: 77 Referring MD:  Date of Birth: 11/27/1945 Gender: Female Account #: [id number] Procedure:                Colonoscopy Indications:              Iron deficiency anemia, Change in bowel habits,                            Diarrhea Medicines:                Monitored Anesthesia Care Procedure:                Pre-Anesthesia Assessment:                           - Prior to the procedure, a History and Physical                            was performed, and patient medications and                            allergies were reviewed. The patient's tolerance of                            previous anesthesia was also reviewed. The risks                            and benefits of the procedure and the sedation                            options and risks were discussed with the patient.                            All questions were answered, and informed consent                            was obtained. Prior Anticoagulants: The patient has                            taken no anticoagulant or antiplatelet agents. ASA                            Grade Assessment: II - A patient with mild systemic                            disease. After reviewing the risks and benefits,                            the patient was deemed in satisfactory condition to                            undergo the procedure.  After obtaining informed consent, the colonoscope                            was passed under direct vision. Throughout the                            procedure, the patient's blood pressure, pulse, and                            oxygen saturations were monitored continuously. The                            Olympus CF-HQ190L ([id number]) Colonoscope was                            introduced through the anus and advanced to the the                             terminal ileum. The colonoscopy was performed                            without difficulty. The patient tolerated the                            procedure well. The quality of the bowel                            preparation was good. The terminal ileum, ileocecal                            valve, appendiceal orifice, and rectum were                            photographed. Scope In: 3:19:19 PM Scope Out: 3:34:42 PM Scope Withdrawal Time: 0 hours 11 minutes 21 seconds  Total Procedure Duration: 0 hours 15 minutes 23 seconds  Findings:                 The perianal and digital rectal examinations were                            normal.                           Normal mucosa was found in the entire colon.                            Biopsies for histology were taken with a cold                            forceps from the right colon and left colon for                            evaluation of microscopic colitis. Estimated blood  loss was minimal.                           A few small-mouthed diverticula were found in the                            sigmoid colon.                           The retroflexed view of the distal rectum and anal                            verge was normal and showed no anal or rectal                            abnormalities.                           The terminal ileum appeared normal. Complications:            No immediate complications. Estimated Blood Loss:     Estimated blood loss was minimal. Impression:               - Normal mucosa in the entire examined colon.                            Biopsied.                           - Diverticulosis in the sigmoid colon.                           - The distal rectum and anal verge are normal on                            retroflexion view.                           - The examined portion of the ileum was normal. Recommendation:           - Patient has a contact  number available for                            emergencies. The signs and symptoms of potential                            delayed complications were discussed with the                            patient. Return to normal activities tomorrow.                            Written discharge instructions were provided to the                            patient.                           -  Resume previous diet.                           - Continue present medications.                           - Await pathology results.                           - Repeat colonoscopy is not recommended due to                            current age (43 years or older) for screening                            purposes.                           - Use fiber, for example Citrucel, Fibercon, Konsyl                            or Metamucil. Doristine Locks, MD 08/03/2023 3:46:45 PM

## 2023-08-03 NOTE — Patient Instructions (Signed)
Resume all of your previous medications today as ordered.  Take your new medicine pantoprazole as ordered.  Take it 1/2 hour before eating or else it won't work. Take metamucil, benefiber or citrucel every day for a fiber supplement.   Read all of your discharge papers, and handouts.  YOU HAD AN ENDOSCOPIC PROCEDURE TODAY AT THE [facility]:   Refer to the procedure report that was given to you for any specific questions about what was found during the examination.  If the procedure report does not answer your questions, please call your gastroenterologist to clarify.  If you requested that your care partner not be given the details of your procedure findings, then the procedure report has been included in a sealed envelope for you to review at your convenience later.  YOU SHOULD EXPECT: Some feelings of bloating in the abdomen. Passage of more gas than usual.  Walking can help get rid of the air that was put into your GI tract during the procedure and reduce the bloating. If you had a lower endoscopy (such as a colonoscopy or flexible sigmoidoscopy) you may notice spotting of blood in your stool or on the toilet paper. If you underwent a bowel prep for your procedure, you may not have a normal bowel movement for a few days.  Please Note:  You might notice some irritation and congestion in your nose or some drainage.  This is from the oxygen used during your procedure.  There is no need for concern and it should clear up in a day or so.  SYMPTOMS TO REPORT IMMEDIATELY:  Following lower endoscopy (colonoscopy or flexible sigmoidoscopy):  Excessive amounts of blood in the stool  Significant tenderness or worsening of abdominal pains  Swelling of the abdomen that is new, acute  Fever of 100F or higher  Following upper endoscopy (EGD)  Vomiting of blood or coffee ground material  New chest pain or pain under the shoulder blades  Painful or persistently difficult swallowing  New  shortness of breath  Fever of 100F or higher  Black, tarry-looking stools  For urgent or emergent issues, a gastroenterologist can be reached at any hour by calling [phone]. Do not use MyChart messaging for urgent concerns.    DIET:  We do recommend a small meal at first, but then you may proceed to your regular diet.  Drink plenty of fluids but you should avoid alcoholic beverages for 24 hours.  ACTIVITY:  You should plan to take it easy for the rest of today and you should NOT DRIVE or use heavy machinery until tomorrow (because of the sedation medicines used during the test).    FOLLOW UP: Our staff will call the number listed on your records the next business day following your procedure.  We will call around 7:15- 8:00 am to check on you and address any questions or concerns that you may have regarding the information given to you following your procedure. If we do not reach you, we will leave a message.     If any biopsies were taken you will be contacted by phone or by letter within the next 1-3 weeks.  Please call us at [phone] if you have not heard about the biopsies in 3 weeks.    SIGNATURES/CONFIDENTIALITY: You and/or your care partner have signed paperwork which will be entered into your electronic medical record.  These signatures attest to the fact that that the information above on your After Visit Summary has been  reviewed and is understood.  Full responsibility of the confidentiality of this discharge information lies with you and/or your care-partner.

## 2023-08-03 NOTE — Progress Notes (Signed)
GASTROENTEROLOGY PROCEDURE H&P NOTE   Primary Care Physician: Lupita Raider, MD    Reason for Procedure:  Iron deficiency anemia, diarrhea  Plan:    EGD, colonoscopy  Patient is appropriate for endoscopic procedure(s) in the ambulatory ([facility]) setting.  The nature of the procedure, as well as the risks, benefits, and alternatives were carefully and thoroughly reviewed with the patient. Ample time for discussion and questions allowed. The patient understood, was satisfied, and agreed to proceed.     HPI: Allison Pierce is a 77 y.o. female who presents for EGD and colonoscopy for evaluation of iron deficiency anemia.    Does have a history of chronic diarrhea, but recent exacerbation earlier this year.  Recent CT in 01/2023 with subtle wall thickening in the sigmoid colon, with follow-up CT in 05/2023 with diffuse colonic wall thickening, hyperenhancement, vascular combing particularly in the distal colon.  Subsequently diagnosed with C. difficile and completed course of oral vancomycin last month.  Past Medical History:  Diagnosis Date   Anxiety    C. difficile diarrhea    Depression    GERD (gastroesophageal reflux disease) OCCASIONALLY  TAKE TUMS   Human papilloma virus    Hyperlipidemia    Hypertension    left breast ca 12/2021   UC (ulcerative colitis) (HCC)    UTI (urinary tract infection)    Vulvar lesion     Past Surgical History:  Procedure Laterality Date   ABDOMINAL HYSTERECTOMY  1992   W/ SALPINGO-OOPHORECTOMY   ANTERIOR CERVICAL DECOMP/DISCECTOMY FUSION  01-18-2009  DR POOLE   C4  - C6   AUGMENTATION MAMMAPLASTY Bilateral    BREAST BIOPSY Right    BREAST BIOPSY Left 02/10/2022   x2   BREAST ENHANCEMENT SURGERY  2011   BREAST EXCISIONAL BIOPSY Left    BREAST RECONSTRUCTION WITH PLACEMENT OF TISSUE EXPANDER AND FLEX HD (ACELLULAR HYDRATED DERMIS) Left 04/07/2022   Procedure: BREAST RECONSTRUCTION WITH PLACEMENT OF TISSUE EXPANDER AND FLEX HD  (ACELLULAR HYDRATED DERMIS);  Surgeon: Allena Napoleon, MD;  Location: [facility] OR;  Service: Plastics;  Laterality: Left;   BREAST SURGERY  02-04-2011  dr Jamey Ripa   EXCISION LEFT BREAST MASS--  CALCIFICATION   EYE SURGERY Bilateral    cataract removal   MASTECTOMY W/ SENTINEL NODE BIOPSY Left 04/07/2022   Procedure: LEFT MASTECTOMY WITH SENTINEL NODE BIOPSY;  Surgeon: Abigail Miyamoto, MD;  Location: [facility] OR;  Service: General;  Laterality: Left;  LMA   PORTACATH PLACEMENT N/A 04/07/2022   Procedure: PORT-A-CATH INSERTION WITH ULTRASOUND GUIDANCE;  Surgeon: Abigail Miyamoto, MD;  Location: [facility] OR;  Service: General;  Laterality: N/A;   REMOVAL OF TISSUE EXPANDER AND PLACEMENT OF IMPLANT Left 03/16/2023   Procedure: Removal of left breast tissue expander and placement of permanent implant;  Surgeon: [name], MD;  Location: [facility];  Service: Plastics;  Laterality: Left;   VULVAR LESION REMOVAL  09/10/2012   Procedure: VULVAR LESION;  Surgeon: Gretta Cool, MD;  Location: [facility];  Service: Gynecology;  Laterality: N/A;  WIDE EXCISION OF VULVAR LESION    Prior to Admission medications   Medication Sig Start Date End Date Taking? Authorizing Provider  buPROPion (WELLBUTRIN XL) 300 MG 24 hr tablet Take 300 mg by mouth every morning.   Yes [provider]  clonazePAM (KLONOPIN) 1 MG tablet Take 1 mg by mouth at bedtime.   Yes [provider]  diphenhydrAMINE (BENADRYL) 25 MG tablet Take 25 mg by mouth  in the morning.   Yes [provider]  losartan (COZAAR) 100 MG tablet Take 1 tablet (100 mg total) by mouth daily. 02/16/23  Yes Bensimhon, Bevelyn Buckles, MD  traZODone (DESYREL) 150 MG tablet Take 225 mg by mouth at bedtime.   Yes [provider]  venlafaxine XR (EFFEXOR-XR) 75 MG 24 hr capsule Take 75 mg by mouth daily with breakfast.   Yes [provider]  B Complex-C (B COMPLEX-VITAMIN C) CAPS Take 1 capsule by mouth  daily.    [provider]  cholecalciferol (VITAMIN D3) 25 MCG (1000 UNIT) tablet Take 1,000 Units by mouth daily. Patient not taking: Reported on 08/03/2023    [provider]  Cyanocobalamin (VITAMIN B-12) 5000 MCG TBDP Take 5,000 Units by mouth daily. Patient not taking: Reported on 08/03/2023    [provider]  fluticasone (FLONASE) 50 MCG/ACT nasal spray Place 1 spray into both nostrils daily as needed for allergies.    [provider]  gabapentin (NEURONTIN) 100 MG capsule Take 1 capsule (100 mg total) by mouth at bedtime. Patient not taking: Reported on 08/03/2023 05/10/23   Shanon Ace, PA-C  lidocaine-prilocaine (EMLA) cream Apply 1 Application topically as needed. Apply one application daily as needed to port a cath site. 12/12/22   Serena Croissant, MD  meloxicam (MOBIC) 15 MG tablet Take 15 mg by mouth daily. Patient not taking: Reported on 08/03/2023    [provider]  methylphenidate (RITALIN) 5 MG tablet Take 5 mg by mouth 2 (two) times daily as needed. 03/30/23   [provider]  Potassium 99 MG TABS Take 99 mg by mouth at bedtime. Patient not taking: Reported on 08/03/2023    [provider]  prochlorperazine (COMPAZINE) 10 MG tablet Take 1 tablet (10 mg total) by mouth every 6 (six) hours as needed (Nausea or vomiting). Patient not taking: Reported on 05/12/2022 04/27/22 06/07/22  Serena Croissant, MD    Current Outpatient Medications  Medication Sig Dispense Refill   buPROPion (WELLBUTRIN XL) 300 MG 24 hr tablet Take 300 mg by mouth every morning.     clonazePAM (KLONOPIN) 1 MG tablet Take 1 mg by mouth at bedtime.     diphenhydrAMINE (BENADRYL) 25 MG tablet Take 25 mg by mouth in the morning.     losartan (COZAAR) 100 MG tablet Take 1 tablet (100 mg total) by mouth daily. 90 tablet 3   traZODone (DESYREL) 150 MG tablet Take 225 mg by mouth at bedtime.     venlafaxine XR (EFFEXOR-XR) 75 MG 24 hr capsule Take 75 mg by  mouth daily with breakfast.     B Complex-C (B COMPLEX-VITAMIN C) CAPS Take 1 capsule by mouth daily.     cholecalciferol (VITAMIN D3) 25 MCG (1000 UNIT) tablet Take 1,000 Units by mouth daily. (Patient not taking: Reported on 08/03/2023)     Cyanocobalamin (VITAMIN B-12) 5000 MCG TBDP Take 5,000 Units by mouth daily. (Patient not taking: Reported on 08/03/2023)     fluticasone (FLONASE) 50 MCG/ACT nasal spray Place 1 spray into both nostrils daily as needed for allergies.     gabapentin (NEURONTIN) 100 MG capsule Take 1 capsule (100 mg total) by mouth at bedtime. (Patient not taking: Reported on 08/03/2023) 14 capsule 0   lidocaine-prilocaine (EMLA) cream Apply 1 Application topically as needed. Apply one application daily as needed to port a cath site. 30 g 3   meloxicam (MOBIC) 15 MG tablet Take 15 mg by mouth daily. (Patient not taking:  Reported on 08/03/2023)     methylphenidate (RITALIN) 5 MG tablet Take 5 mg by mouth 2 (two) times daily as needed.     Potassium 99 MG TABS Take 99 mg by mouth at bedtime. (Patient not taking: Reported on 08/03/2023)     Current Facility-Administered Medications  Medication Dose Route Frequency Provider Last Rate Last Admin   0.9 %  sodium chloride infusion  500 mL Intravenous Continuous Dallana Mavity V, DO        Allergies as of 08/03/2023 - Review Complete 08/03/2023  Allergen Reaction Noted   Oxybutynin Other (See Comments) 03/08/2022   Prozac [fluoxetine hcl] Other (See Comments) 09/05/2012   Ritalin [methylphenidate] Other (See Comments) 03/08/2022   Rosuvastatin Other (See Comments) 02/14/2022   Lyrica [pregabalin] Anxiety and Other (See Comments) 09/05/2012    Family History  Problem Relation Age of Onset   Lung cancer Mother 87       she smoked   Prostate cancer Brother 42   Breast cancer Cousin        paternal first cousin   Breast cancer Cousin        paternal first cousin   Liver disease Neg Hx    Esophageal cancer Neg Hx    Colon  cancer Neg Hx     Social History   Socioeconomic History   Marital status: Married    Spouse name: Fayrene Fearing   Number of children: 1   Years of education: 12   Highest education level: Not on file  Occupational History   Occupation: retired  Tobacco Use   Smoking status: Never   Smokeless tobacco: Never  Vaping Use   Vaping status: Never Used  Substance and Sexual Activity   Alcohol use: Yes    Alcohol/week: 7.0 standard drinks of alcohol    Types: 7 Cans of beer per week   Drug use: Never   Sexual activity: Not Currently  Other Topics Concern   Not on file  Social History Narrative   Graduated HS      Right handed      Lives with husband   Social Determinants of Health   Financial Resource Strain: Low Risk  (02/22/2022)   Overall Financial Resource Strain (CARDIA)    Difficulty of Paying Living Expenses: Not hard at all  Food Insecurity: No Food Insecurity (02/22/2022)   Hunger Vital Sign    Worried About Running Out of Food in the Last Year: Never true    Ran Out of Food in the Last Year: Never true  Transportation Needs: No Transportation Needs (02/22/2022)   PRAPARE - Administrator, Civil Service (Medical): No    Lack of Transportation (Non-Medical): No  Physical Activity: Not on file  Stress: Not on file  Social Connections: Not on file  Intimate Partner Violence: Not on file    Physical Exam: Vital signs in last 24 hours: @BP  (!) 144/77   Pulse 81   Temp 98 F (36.7 C)   Ht 5\' 6"  (1.676 m)   Wt 150 lb (68 kg)   SpO2 98%   BMI 24.21 kg/m  GEN: NAD EYE: Sclerae anicteric ENT: MMM CV: Non-tachycardic Pulm: CTA b/l GI: Soft, NT/ND NEURO:  Alert & Oriented x 3   Doristine Locks, DO [facility]   08/03/2023 2:57 PM

## 2023-08-06 ENCOUNTER — Telehealth: Payer: Self-pay | Admitting: Radiation Therapy

## 2023-08-06 ENCOUNTER — Telehealth: Payer: Self-pay | Admitting: *Deleted

## 2023-08-06 ENCOUNTER — Other Ambulatory Visit: Payer: Self-pay | Admitting: Radiation Therapy

## 2023-08-06 DIAGNOSIS — C799 Secondary malignant neoplasm of unspecified site: Secondary | ICD-10-CM

## 2023-08-06 NOTE — Telephone Encounter (Signed)
I spoke with [name] about the recommendation to get another brain MRI in 6 weeks using IV contrast and the 3T [facility] Pituitary Protocol at [facility] followed by a visit with neuro-oncologist, Dr. Barbaraann Cao, to review the scan and order pituitary labs.   The scan and consult have been scheduled, she is aware of the appointments and plans to attend.   Allison Pierce R.T.(R)(T)

## 2023-08-06 NOTE — Telephone Encounter (Signed)
  Follow up Call-     08/03/2023    2:43 PM  Call back number  Post procedure Call Back phone  # [phone]  Permission to leave phone message Yes     Patient questions:  Message left to call if necessary.

## 2023-08-08 LAB — ARGININE VASOPRESSIN HORMONE
ADH: <0.8 pg/mL (ref 0.0–4.7)
Osmolality Meas: 281 mosm/kg (ref 280–301)

## 2023-08-08 LAB — SURGICAL PATHOLOGY

## 2023-08-10 ENCOUNTER — Telehealth: Payer: Self-pay | Admitting: Gastroenterology

## 2023-08-10 NOTE — Telephone Encounter (Signed)
Left message for patient to return call to further discuss results.  Will continue efforts.

## 2023-08-10 NOTE — Telephone Encounter (Signed)
Inbound call from patient, returning Nicole's call in regards to Pathology results.

## 2023-08-13 ENCOUNTER — Inpatient Hospital Stay (HOSPITAL_BASED_OUTPATIENT_CLINIC_OR_DEPARTMENT_OTHER): Payer: Medicare Other | Admitting: Hematology and Oncology

## 2023-08-13 ENCOUNTER — Inpatient Hospital Stay: Payer: Medicare Other | Attending: Hematology and Oncology

## 2023-08-13 VITALS — BP 150/68 | HR 72 | Temp 97.7°F | Resp 20

## 2023-08-13 VITALS — BP 162/89 | HR 72 | Temp 97.3°F | Resp 18 | Ht 66.0 in | Wt 149.2 lb

## 2023-08-13 DIAGNOSIS — C7951 Secondary malignant neoplasm of bone: Secondary | ICD-10-CM

## 2023-08-13 DIAGNOSIS — Z79899 Other long term (current) drug therapy: Secondary | ICD-10-CM | POA: Diagnosis not present

## 2023-08-13 DIAGNOSIS — Z171 Estrogen receptor negative status [ER-]: Secondary | ICD-10-CM | POA: Insufficient documentation

## 2023-08-13 DIAGNOSIS — Z801 Family history of malignant neoplasm of trachea, bronchus and lung: Secondary | ICD-10-CM | POA: Insufficient documentation

## 2023-08-13 DIAGNOSIS — Z8042 Family history of malignant neoplasm of prostate: Secondary | ICD-10-CM | POA: Diagnosis not present

## 2023-08-13 DIAGNOSIS — Z5112 Encounter for antineoplastic immunotherapy: Secondary | ICD-10-CM | POA: Insufficient documentation

## 2023-08-13 DIAGNOSIS — Z9012 Acquired absence of left breast and nipple: Secondary | ICD-10-CM | POA: Insufficient documentation

## 2023-08-13 DIAGNOSIS — C50412 Malignant neoplasm of upper-outer quadrant of left female breast: Secondary | ICD-10-CM

## 2023-08-13 DIAGNOSIS — Z95828 Presence of other vascular implants and grafts: Secondary | ICD-10-CM

## 2023-08-13 DIAGNOSIS — Z803 Family history of malignant neoplasm of breast: Secondary | ICD-10-CM | POA: Diagnosis not present

## 2023-08-13 DIAGNOSIS — R930 Abnormal findings on diagnostic imaging of skull and head, not elsewhere classified: Secondary | ICD-10-CM | POA: Diagnosis not present

## 2023-08-13 DIAGNOSIS — G893 Neoplasm related pain (acute) (chronic): Secondary | ICD-10-CM | POA: Diagnosis not present

## 2023-08-13 MED ORDER — SODIUM CHLORIDE 0.9% FLUSH
10.0000 mL | INTRAVENOUS | Status: DC | PRN
Start: 2023-08-13 — End: 2023-08-13
  Administered 2023-08-13: 10 mL

## 2023-08-13 MED ORDER — ACETAMINOPHEN 325 MG PO TABS
650.0000 mg | ORAL_TABLET | Freq: Once | ORAL | Status: AC
Start: 2023-08-13 — End: 2023-08-13
  Administered 2023-08-13: 650 mg via ORAL
  Filled 2023-08-13: qty 2

## 2023-08-13 MED ORDER — DIPHENHYDRAMINE HCL 25 MG PO CAPS
25.0000 mg | ORAL_CAPSULE | Freq: Once | ORAL | Status: AC
  Administered 2023-08-13: 25 mg via ORAL
  Filled 2023-08-13: qty 1

## 2023-08-13 MED ORDER — TRASTUZUMAB-DKST CHEMO 150 MG IV SOLR
6.0000 mg/kg | Freq: Once | INTRAVENOUS | Status: AC
  Administered 2023-08-13: 420 mg via INTRAVENOUS
  Filled 2023-08-13: qty 20

## 2023-08-13 MED ORDER — HEPARIN SOD (PORK) LOCK FLUSH 100 UNIT/ML IV SOLN
500.0000 [IU] | Freq: Once | INTRAVENOUS | Status: AC | PRN
Start: 2023-08-13 — End: 2023-08-13
  Administered 2023-08-13: 500 [IU]

## 2023-08-13 MED ORDER — SODIUM CHLORIDE 0.9 % IV SOLN: Freq: Once | INTRAVENOUS | Status: AC

## 2023-08-13 NOTE — Progress Notes (Signed)
Patient Care Team: Lupita Raider, MD as PCP - General (Family Medicine) Van Clines, MD as Consulting Physician (Neurology) Pershing Proud, RN as Oncology Nurse Navigator Donnelly Angelica, RN as Oncology Nurse Navigator Abigail Miyamoto, MD as Consulting Physician (General Surgery) Serena Croissant, MD as Consulting Physician (Hematology and Oncology) Lonie Peak, MD as Attending Physician (Radiation Oncology)  DIAGNOSIS:  Encounter Diagnoses  Name Primary?   Malignant neoplasm of upper-outer quadrant of left breast in female, estrogen receptor negative (HCC) Yes   Metastasis to bone (HCC)     SUMMARY OF ONCOLOGIC HISTORY: Oncology History  Malignant neoplasm of upper-outer quadrant of left breast in female, estrogen receptor negative (HCC)  02/10/2022 Initial Diagnosis   Palpable left breast masses and calcifications: 1.8 cm at 1:00 and 1.7 cm at 9:00, calcifications not biopsy.  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 Report date is 03/05/2022.  The CustomNext-Cancer+RNAinsight panel offered by [facility] includes sequencing and rearrangement analysis for the following 48 genes:  APC, ATM, AXIN2, BARD1, BMPR1A, BRCA1, BRCA2, BRIP1, CDH1, CDK4, CDKN2A, CHEK2, CTNNA1, DICER1, EGFR, EPCAM, GREM1, HOXB13, KIT, MEN1, MLH1, MSH2, MSH3, MSH6, MUTYH, NBN, NF1, NTHL1, PALB2, PDGFRA, PMS2, POLD1, POLE, PTEN, RAD50, RAD51C, RAD51D, SDHA, SDHB, SDHC, SDHD, SMAD4, SMARCA4, STK11, TP53, TSC1, TSC2, and VHL.  RNA data is routinely analyzed for use in variant interpretation for all genes.    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, LCIS, 5/5 lymph nodes positive with extranodal extension, margins negative ER 0%, PR 0%, HER2 3+, Ki-67 20%   04/27/2022 Cancer Staging   Staging form: Breast, AJCC 8th Edition - Pathologic: Stage IIIA (pT2, pN2, cM0, G2, ER-, PR-, HER2+) - Signed by Serena Croissant, MD on 04/27/2022 Histologic grading system: 3 grade system   05/05/2022 - 05/26/2022 Chemotherapy   Patient is on Treatment Plan : BREAST DOCEtaxel + Trastuzumab + Pertuzumab (THP) q21d x 8 cycles / Trastuzumab + Pertuzumab q21d x 4 cycles     05/26/2022 -  Chemotherapy   Patient is on Treatment Plan : BREAST MAINTENANCE Trastuzumab IV (6) or SQ (600) D1 q21d X 11 Cycles     06/12/2023 Cancer Staging   Staging form: Breast, AJCC 8th Edition - Pathologic: Stage IV (cM1) - Signed by [name], NP on 06/12/2023     CHIEF COMPLIANT: Follow-up on [name]  HISTORY OF PRESENT ILLNESS:   History of Present Illness   The patient, with a history of cancer, presents with concerns about her pituitary gland and increasing bone pain. She recently underwent testing for a suspected pituitary gland issue, but has not received clear information about the results. She expresses anxiety about the upcoming MRI and the potential for a tumor.  In addition to this, she reports increasing bone pain, particularly in the legs and hips. She describes the pain as significant enough to affect her mobility. She also reports improved bowel function, with no more diarrhea and a maximum of two bowel movements per day.  The patient is currently on [name], an immunotherapy treatment for her cancer.  She reports no significant side effects from this treatment.         ALLERGIES:  is allergic to oxybutynin, prozac [fluoxetine hcl], ritalin [methylphenidate], rosuvastatin, and lyrica [pregabalin].  MEDICATIONS:  Current Outpatient Medications  Medication  Sig Dispense Refill   B Complex-C (B COMPLEX-VITAMIN C) CAPS Take 1 capsule by mouth daily.     buPROPion (WELLBUTRIN XL) 300 MG 24 hr tablet Take 300 mg by mouth every morning.     cholecalciferol (VITAMIN D3) 25 MCG (1000 UNIT) tablet Take 1,000 Units by mouth daily. (Patient not taking: Reported on 08/03/2023)     clonazePAM (KLONOPIN) 1 MG tablet Take 1 mg by mouth at bedtime.     Cyanocobalamin (VITAMIN B-12) 5000 MCG TBDP Take 5,000 Units by mouth daily. (Patient not taking: Reported on 08/03/2023)     diphenhydrAMINE (BENADRYL) 25 MG tablet Take 25 mg by mouth in the morning.     fluticasone (FLONASE) 50 MCG/ACT nasal spray Place 1 spray into both nostrils daily as needed for allergies.     gabapentin (NEURONTIN) 100 MG capsule Take 1 capsule (100 mg total) by mouth at bedtime. (Patient not taking: Reported on 08/03/2023) 14 capsule 0   lidocaine-prilocaine (EMLA) cream Apply 1 Application topically as needed. Apply one application daily as needed to port a cath site. 30 g 3   losartan (COZAAR) 100 MG tablet Take 1 tablet (100 mg total) by mouth daily. 90 tablet 3   meloxicam (MOBIC) 15 MG tablet Take 15 mg by mouth daily. (Patient not taking: Reported on 08/03/2023)     methylphenidate (RITALIN) 5 MG tablet Take 5 mg by mouth 2 (two) times daily as needed.     pantoprazole (PROTONIX) 40 MG tablet Take 1 tablet (40 mg total) by mouth 2 (two) times daily. 90 tablet 3   Potassium 99 MG TABS Take 99 mg by mouth at bedtime. (Patient not taking: Reported on 08/03/2023)     traZODone (DESYREL) 150 MG tablet Take 225 mg by mouth at bedtime.     venlafaxine XR (EFFEXOR-XR) 75 MG 24 hr capsule Take 75 mg by mouth daily with breakfast.     No current facility-administered medications for this visit.    PHYSICAL EXAMINATION: ECOG PERFORMANCE STATUS: 1 - Symptomatic but completely ambulatory  Vitals:   08/13/23 0954  BP: (!) 162/89  Pulse: 72  Resp: 18  Temp: (!) 97.3 F (36.3 C)  SpO2: 99%    Filed Weights   08/13/23 0954  Weight: 149 lb 3.2 oz (67.7 kg)      LABORATORY DATA:  I have reviewed the data as listed    Latest Ref Rng & Units 07/23/2023   11:03 AM 06/25/2023   10:20 AM 05/10/2023   10:15 AM  CMP  Glucose 70 - 99 mg/dL 99  93  409   BUN 8 - 23 mg/dL 10  8  7    Creatinine 0.44 - 1.00 mg/dL 8.11  9.14  7.82   Sodium 135 - 145 mmol/L 138  140  134   Potassium 3.5 - 5.1 mmol/L 4.2  3.7  3.8   Chloride 98 - 111 mmol/L 105  104  100   CO2 22 - 32 mmol/L 29  30  29    Calcium 8.9 - 10.3 mg/dL 9.0  9.1  8.7   Total Protein 6.5 - 8.1 g/dL 6.2  6.3  6.1   Total Bilirubin 0.3 - 1.2 mg/dL 0.6  0.7  0.5   Alkaline Phos  38 - 126 U/L 73  76  83   AST 15 - 41 U/L 18  19  19    ALT 0 - 44 U/L 15  16  15      Lab Results  Component Value Date   WBC 6.4 07/23/2023   HGB 11.2 (L) 07/23/2023   HCT 34.3 (L) 07/23/2023   MCV 84.7 07/23/2023   PLT 276 07/23/2023   NEUTROABS 4.7 07/23/2023    ASSESSMENT & PLAN:  Malignant neoplasm of upper-outer quadrant of left breast in female, estrogen receptor negative (HCC) 04/07/2022:Left mastectomy: 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  T4 vertebral body Bone scan 04/25/2022: Abnormal uptake in the sternum, right parietal region of calvarium, right posterior seventh rib, T5, left sternoclavicular joint 07/30/2022: Bone scan: Interval resolution/decreasing accumulation of multiple bone metastases 10/30/2022: CT CAP: Mildly prominent mediastinal lymph nodes are similar, no pulmonary metastasis, skeletal lesions increasing sclerosis, no visceral metastases 01/16/2023: CT CAP: Stable prominent mediastinal lymph nodes, slight increase in the small nodes in the mesentery could be reactive, scattered  bone metastases stable 05/16/2023: CT CAP: Stable bone metastases, unchanged pretracheal lymph nodes.  Previous mesenteric lymph nodes not conspicuous.  Diffuse colonic mucosal thickening: Colitis   Treatment: Palliative chemotherapy with Taxotere [name] and Perjeta every 3 weeks x1 cycle given 05/05/2022 (discontinued for severe toxicities and hospitalization)   ---------------------------------------------------------------------------------------------------------------------------------------------- Chemotoxicities: Hospitalization 05/11/2022-05/18/2022: Convulsive syncope, chemo induced neutropenia, oral mucositis, chronic CHF, severe diarrhea with dehydration ECHO 06/26/22: EF 55-60% (follows with Bensimhon)   Treatment plan:05/26/22 [name] maintenance.  (Added Perjeta 06/21/22-discontinued 06/2023 secondary to colitis) CT CAP 05/16/2023: Bone metastases unchanged, unchanged pretracheal lymph nodes, colitis  Pituitary Lesion 10mm lesion identified on recent MRI. Unclear etiology, could be benign pituitary adenoma or metastatic breast cancer, follows with Dr. Barbaraann Cao.  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 Hormone levels normal. Awaiting follow-up MRI. -Follow-up with Dr. Barbaraann Cao for further management.  Bone Pain Increased bone pain reported. Last full body scan in August showed stable disease. -Schedule full body scan for December 15th to assess for changes.  Bowel Changes Improvement in bowel habits since last visit. No longer experiencing diarrhea. -Continue current management.     Return to clinic every 3 weeks for [name] and every 6-week for follow-up with me.  CT chest abdomen pelvis to be obtained for 09/17/2023 and will follow-up on 09/24/2023 to discuss results.     Orders Placed This Encounter  Procedures   CT CHEST ABDOMEN PELVIS W CONTRAST    Standing Status:   Future    Standing Expiration Date:   08/12/2024    Order  Specific Question:   If indicated for the ordered procedure, I authorize the administration of contrast media per Radiology protocol    Answer:   Yes    Order Specific Question:   Does the patient have a contrast media/X-ray dye allergy?    Answer:   No    Order Specific Question:   Preferred imaging location?    Answer:   [facility]    Order Specific Question:   Release to patient    Answer:   Immediate    Order Specific Question:   If indicated for the ordered procedure, I authorize the administration of  oral contrast media per Radiology protocol    Answer:   Yes   The patient has a good understanding of the overall plan. she agrees with it. she will call with any problems that may develop before the next visit here. Total time spent: 30 mins including face to face time and time spent for planning, charting and co-ordination of care   Tamsen Meek, MD 08/13/23

## 2023-08-13 NOTE — Assessment & Plan Note (Signed)
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  T4 vertebral body Bone scan 04/25/2022: Abnormal uptake in the sternum, right parietal region of calvarium, right posterior seventh rib, T5, left sternoclavicular joint 07/30/2022: Bone scan: Interval resolution/decreasing accumulation of multiple bone metastases 10/30/2022: CT CAP: Mildly prominent mediastinal lymph nodes are similar, no pulmonary metastasis, skeletal lesions increasing sclerosis, no visceral metastases 01/16/2023: CT CAP: Stable prominent mediastinal lymph nodes, slight increase in the small nodes in the mesentery could be reactive, scattered bone metastases stable 05/16/2023: CT CAP: Stable bone metastases, unchanged pretracheal lymph nodes.  Previous mesenteric lymph nodes not conspicuous.  Diffuse colonic mucosal thickening: Colitis   Treatment: Palliative chemotherapy with Taxotere [name] and Perjeta every 3 weeks x1 cycle given 05/05/2022 (discontinued for severe toxicities and hospitalization)   ---------------------------------------------------------------------------------------------------------------------------------------------- Chemotoxicities: Hospitalization 05/11/2022-05/18/2022: Convulsive syncope, chemo induced neutropenia, oral mucositis, chronic CHF, severe diarrhea with dehydration ECHO 06/26/22: EF 55-60% (follows with Bensimhon)   Treatment plan:05/26/22 [name] maintenance.  (Added Perjeta 06/21/22-discontinued 06/2023 secondary to colitis) CT CAP 05/16/2023: Bone metastases unchanged, unchanged pretracheal lymph nodes, colitis  Pituitary Lesion 10mm lesion identified on recent MRI. Unclear etiology, could be benign pituitary adenoma or metastatic  breast cancer, follows with Dr. Barbaraann Cao.  Another brain MRI coming up in December  Return to clinic every 3 weeks for [name] every 6 weeks of follow-up with [place]

## 2023-08-13 NOTE — Telephone Encounter (Signed)
Inbound call from patient returning phone call. Requesting a call back. Please advise, thank you.  

## 2023-08-13 NOTE — Patient Instructions (Signed)
[facility] - A DEPT OF [facility]  Discharge Instructions: Thank you for choosing [facility] to provide your oncology and hematology care.   If you have a lab appointment with the Cancer Center, please go directly to the Cancer Center and check in at the registration area.   Wear comfortable clothing and clothing appropriate for easy access to any Portacath or PICC line.   We strive to give you quality time with your provider. You may need to reschedule your appointment if you arrive late (15 or more minutes).  Arriving late affects you and other patients whose appointments are after yours.  Also, if you miss three or more appointments without notifying the office, you may be dismissed from the clinic at the provider's discretion.      For prescription refill requests, have your pharmacy contact our office and allow 72 hours for refills to be completed.    Today you received the following chemotherapy and/or immunotherapy agent: Trastuzumab (Ogivri)      To help prevent nausea and vomiting after your treatment, we encourage you to take your nausea medication as directed.  BELOW ARE SYMPTOMS THAT SHOULD BE REPORTED IMMEDIATELY: *FEVER GREATER THAN 100.4 F (38 C) OR HIGHER *CHILLS OR SWEATING *NAUSEA AND VOMITING THAT IS NOT CONTROLLED WITH YOUR NAUSEA MEDICATION *UNUSUAL SHORTNESS OF BREATH *UNUSUAL BRUISING OR BLEEDING *URINARY PROBLEMS (pain or burning when urinating, or frequent urination) *BOWEL PROBLEMS (unusual diarrhea, constipation, pain near the anus) TENDERNESS IN MOUTH AND THROAT WITH OR WITHOUT PRESENCE OF ULCERS (sore throat, sores in mouth, or a toothache) UNUSUAL RASH, SWELLING OR PAIN  UNUSUAL VAGINAL DISCHARGE OR ITCHING   Items with * indicate a potential emergency and should be followed up as soon as possible or go to the Emergency Department if any problems should occur.  Please show the CHEMOTHERAPY ALERT CARD or  IMMUNOTHERAPY ALERT CARD at check-in to the Emergency Department and triage nurse.  Should you have questions after your visit or need to cancel or reschedule your appointment, please contact [facility] - A DEPT OF [facility]  Dept: [phone]  and follow the prompts.  Office hours are 8:00 a.m. to 4:30 p.m. Monday - Friday. Please note that voicemails left after 4:00 p.m. may not be returned until the following business day.  We are closed weekends and major holidays. You have access to a nurse at all times for urgent questions. Please call the main number to the clinic Dept: [phone] and follow the prompts.   For any non-urgent questions, you may also contact your provider using MyChart. We now offer e-Visits for anyone 66 and older to request care online for non-urgent symptoms. For details visit [email or web address].   Also download the MyChart app! Go to the app store, search "MyChart", open the app, select [place], and log in with your MyChart username and password.  Trastuzumab Injection What is this medication? TRASTUZUMAB (tras TOO zoo mab) treats breast cancer and stomach cancer. It works by blocking a protein that causes cancer cells to grow and multiply. This helps to slow or stop the spread of cancer cells. This medicine may be used for other purposes; ask your health care provider or pharmacist if you have questions. COMMON BRAND NAME(S): [name], Marlowe Alt, Ontruzant, Trazimera What should I tell my care team before I take this medication? They need to know if you have any of these conditions: Heart failure  Lung disease An unusual or allergic reaction to trastuzumab, other medications, foods, dyes, or preservatives Pregnant or trying to get pregnant Breast-feeding How should I use this medication? This medication is injected into a vein. It is given by your care team in a hospital or clinic setting. Talk to your  care team about the use of this medication in children. It is not approved for use in children. Overdosage: If you think you have taken too much of this medicine contact a poison control center or emergency room at once. NOTE: This medicine is only for you. Do not share this medicine with others. What if I miss a dose? Keep appointments for follow-up doses. It is important not to miss your dose. Call your care team if you are unable to keep an appointment. What may interact with this medication? Certain types of chemotherapy, such as daunorubicin, doxorubicin, epirubicin, idarubicin This list may not describe all possible interactions. Give your health care provider a list of all the medicines, herbs, non-prescription drugs, or dietary supplements you use. Also tell them if you smoke, drink alcohol, or use illegal drugs. Some items may interact with your medicine. What should I watch for while using this medication? Your condition will be monitored carefully while you are receiving this medication. This medication may make you feel generally unwell. This is not uncommon, as chemotherapy affects healthy cells as well as cancer cells. Report any side effects. Continue your course of treatment even though you feel ill unless your care team tells you to stop. This medication may increase your risk of getting an infection. Call your care team for advice if you get a fever, chills, sore throat, or other symptoms of a cold or flu. Do not treat yourself. Try to avoid being around people who are sick. Avoid taking medications that contain aspirin, acetaminophen, ibuprofen, naproxen, or ketoprofen unless instructed by your care team. These medications can hide a fever. Talk to your care team if you may be pregnant. Serious birth defects can occur if you take this medication during pregnancy and for 7 months after the last dose. You will need a negative pregnancy test before starting this medication. Contraception  is recommended while taking this medication and for 7 months after the last dose. Your care team can help you find the option that works for you. Do not breastfeed while taking this medication and for 7 months after stopping treatment. What side effects may I notice from receiving this medication? Side effects that you should report to your care team as soon as possible: Allergic reactions or angioedema--skin rash, itching or hives, swelling of the face, eyes, lips, tongue, arms, or legs, trouble swallowing or breathing Dry cough, shortness of breath or trouble breathing Heart failure--shortness of breath, swelling of the ankles, feet, or hands, sudden weight gain, unusual weakness or fatigue Infection--fever, chills, cough, or sore throat Infusion reactions--chest pain, shortness of breath or trouble breathing, feeling faint or lightheaded Side effects that usually do not require medical attention (report to your care team if they continue or are bothersome): Diarrhea Dizziness Headache Nausea Trouble sleeping Vomiting This list may not describe all possible side effects. Call your doctor for medical advice about side effects. You may report side effects to FDA at 1-800-FDA-1088. Where should I keep my medication? This medication is given in a hospital or clinic. It will not be stored at home. NOTE: This sheet is a summary. It may not cover all possible information. If you have questions  about this medicine, talk to your doctor, pharmacist, or health care provider.  2024 Elsevier/Gold Standard (2022-01-31 00:00:00)  Rehydration, Older Adult  Rehydration is the replacement of fluids, salts, and minerals in the body (electrolytes) that are lost during dehydration. Dehydration is when there is not enough water or other fluids in the body. This happens when you lose more fluids than you take in. People who are age 81 or older have a higher risk of dehydration than younger adults. This is  because in older age, the body: Is less able to maintain the right amount of water. Does not respond to temperature changes as well. Does not get a sense of thirst as easily or quickly. Other causes include: Not drinking enough fluids. This can occur when you are ill, when you forget to drink, or when you are doing activities that require a lot of energy, especially in hot weather. Conditions that cause loss of water or other fluids. These include diarrhea, vomiting, sweating, or urinating a lot. Other illnesses, such as fever or infection. Certain medicines, such as those that remove excess fluid from the body (diuretics). Symptoms of mild or moderate dehydration may include thirst, dry lips and mouth, and dizziness. Symptoms of severe dehydration may include increased heart rate, confusion, fainting, and not urinating. In severe cases, you may need to get fluids through an IV at the hospital. For mild or moderate cases, you can usually rehydrate at home by drinking certain fluids as told by your health care provider. What are the risks? Rehydration is usually safe. Taking in too much fluid (overhydration) can be a problem but is rare. Overhydration can cause an imbalance of electrolytes in the body, kidney failure, fluid in the lungs, or a decrease in salt (sodium) levels in the body. Supplies needed: You will need an oral rehydration solution (ORS) if your health care provider tells you to use one. This is a drink to treat dehydration. It can be found in pharmacies and retail stores. How to rehydrate Fluids Follow instructions from your health care provider about what to drink. The kind of fluid and the amount you should drink depend on your condition. In general, you should choose drinks that you prefer. If told by your health care provider, drink an ORS. Make an ORS by following instructions on the package. Start by drinking small amounts, about  cup (120 mL) every 5-10 minutes. Slowly  increase how much you drink until you have taken in the amount recommended by your health care provider. Drink enough clear fluids to keep your urine pale yellow. If you were told to drink an ORS, finish it first, then start slowly drinking other clear fluids. Drink fluids such as: Water. This includes sparkling and flavored water. Drinking only water can lead to having too little sodium in your body (hyponatremia). Follow the advice of your health care provider. Water from ice chips you suck on. Fruit juice with water added to it(diluted). Sports drinks. Hot or cold herbal teas. Broth-based soups. Coffee. Milk or milk products. Food Follow instructions from your health care provider about what to eat while you rehydrate. Your health care provider may recommend that you slowly begin eating regular foods in small amounts. Eat foods that contain a healthy balance of electrolytes, such as bananas, oranges, potatoes, tomatoes, and spinach. Avoid foods that are greasy or contain a lot of sugar. In some cases, you may get nutrition through a feeding tube that is passed through your nose and into your  stomach (nasogastric tube, or NG tube). This may be done if you have uncontrolled vomiting or diarrhea. Drinks to avoid  Certain drinks may make dehydration worse. While you rehydrate, avoid drinking alcohol. How to tell if you are recovering from dehydration You may be getting better if: You are urinating more often than before you started rehydrating. Your urine is pale yellow. Your energy level improves. You vomit less often. You have diarrhea less often. Your appetite improves or returns to normal. You feel less dizzy or light-headed. Your skin tone and color start to look more normal. Follow these instructions at home: Take over-the-counter and prescription medicines only as told by your health care provider. Do not take sodium tablets. Doing this can lead to having too much sodium in your  body (hypernatremia). Contact a health care provider if: You continue to have symptoms of mild or moderate dehydration, such as: Thirst. Dry lips. Slightly dry mouth. Dizziness. Dark urine or less urine than usual. Muscle cramps. You continue to vomit or have diarrhea. Get help right away if: You have symptoms of dehydration that get worse. You have a fever. You have a severe headache. You have been vomiting and have problems, such as: Your vomiting gets worse. Your vomit includes blood or green matter (bile). You cannot eat or drink without vomiting. You have problems with urination or bowel movements, such as: Diarrhea that gets worse. Blood in your stool (feces). This may cause stool to look black and tarry. Not urinating, or urinating only a small amount of very dark urine, within 6-8 hours. You have trouble breathing. You have symptoms that get worse with treatment. These symptoms may be an emergency. Get help right away. Call 911. Do not wait to see if the symptoms will go away. Do not drive yourself to the hospital. This information is not intended to replace advice given to you by your health care provider. Make sure you discuss any questions you have with your health care provider. Document Revised: 02/01/2022 Document Reviewed: 01/30/2022 Elsevier Patient Education  2024 [employer].

## 2023-08-14 MED ORDER — BISMUTH SUBSALICYLATE 262 MG PO CHEW: 524.0000 mg | CHEWABLE_TABLET | Freq: Four times a day (QID) | ORAL | 0 refills | Status: AC

## 2023-08-14 MED ORDER — METRONIDAZOLE 250 MG PO TABS
250.0000 mg | ORAL_TABLET | Freq: Four times a day (QID) | ORAL | 0 refills | Status: AC
Start: 2023-08-14 — End: 2023-08-28

## 2023-08-14 MED ORDER — DOXYCYCLINE HYCLATE 100 MG PO TABS: 100.0000 mg | ORAL_TABLET | Freq: Two times a day (BID) | ORAL | 0 refills | Status: AC

## 2023-08-14 NOTE — Telephone Encounter (Signed)
Patient notified of pathology results from colonoscopy and EGD per Dr Barron Alvine.  Patient advised that there was no evidence of microscopic colitis or celiac disease.  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 2) Pepto Bismol 2 tabs (262 mg each) 4 times a day x 14 days 3) Metronidazole 250 mg 4 times a day x 14 days 4) doxycycline 100 mg 2 times a day x 14 days  Patient advised to recheck H. Pylori stool antigen 4 weeks after treatment completed.  Patient agreed to plan and verbalized understanding.  No further questions.

## 2023-08-17 ENCOUNTER — Telehealth: Payer: Self-pay | Admitting: Gastroenterology

## 2023-08-17 NOTE — Telephone Encounter (Signed)
Patient called and stated she was having some issues with her medication she was give for her H Pylori gastritis. Requesting a call back. Please Advise.

## 2023-08-17 NOTE — Telephone Encounter (Signed)
Left message for patient to return call to further discuss issues.  Will continue efforts.

## 2023-08-17 NOTE — Telephone Encounter (Signed)
Spoke with issues regarding medications that she is currently taking.  Patient stated she "was tired all the way to her bones and just feels tired and achy."  Patient denies fever, chills, CP, or SHOB.  Patient questioned would any of the medications cause her to feel this way.  Explained to the patient in depth the names of the medications that we called in and their use.  Patient advised these medications should not make her tired or achy, but if she noticed fever, chills, rash, unexplained swelling or shortness of breath she should report to nearest ED.  Patient agreed to plan and verbalized understanding.  No further questions.

## 2023-08-29 DIAGNOSIS — I10 Essential (primary) hypertension: Secondary | ICD-10-CM | POA: Diagnosis not present

## 2023-08-29 DIAGNOSIS — C50919 Malignant neoplasm of unspecified site of unspecified female breast: Secondary | ICD-10-CM | POA: Diagnosis not present

## 2023-08-29 DIAGNOSIS — C7951 Secondary malignant neoplasm of bone: Secondary | ICD-10-CM | POA: Diagnosis not present

## 2023-08-29 DIAGNOSIS — Z79899 Other long term (current) drug therapy: Secondary | ICD-10-CM | POA: Diagnosis not present

## 2023-08-29 DIAGNOSIS — J989 Respiratory disorder, unspecified: Secondary | ICD-10-CM | POA: Diagnosis not present

## 2023-09-03 ENCOUNTER — Inpatient Hospital Stay: Payer: Medicare Other | Attending: Hematology and Oncology

## 2023-09-03 VITALS — BP 153/76 | HR 71 | Temp 98.3°F | Resp 16 | Wt 154.5 lb

## 2023-09-03 DIAGNOSIS — Z9012 Acquired absence of left breast and nipple: Secondary | ICD-10-CM | POA: Diagnosis not present

## 2023-09-03 DIAGNOSIS — Z171 Estrogen receptor negative status [ER-]: Secondary | ICD-10-CM | POA: Insufficient documentation

## 2023-09-03 DIAGNOSIS — C7951 Secondary malignant neoplasm of bone: Secondary | ICD-10-CM | POA: Insufficient documentation

## 2023-09-03 DIAGNOSIS — D352 Benign neoplasm of pituitary gland: Secondary | ICD-10-CM | POA: Insufficient documentation

## 2023-09-03 DIAGNOSIS — Z5112 Encounter for antineoplastic immunotherapy: Secondary | ICD-10-CM | POA: Insufficient documentation

## 2023-09-03 DIAGNOSIS — C50412 Malignant neoplasm of upper-outer quadrant of left female breast: Secondary | ICD-10-CM | POA: Insufficient documentation

## 2023-09-03 DIAGNOSIS — Z801 Family history of malignant neoplasm of trachea, bronchus and lung: Secondary | ICD-10-CM | POA: Diagnosis not present

## 2023-09-03 DIAGNOSIS — Z79899 Other long term (current) drug therapy: Secondary | ICD-10-CM | POA: Diagnosis not present

## 2023-09-03 DIAGNOSIS — Z8042 Family history of malignant neoplasm of prostate: Secondary | ICD-10-CM | POA: Insufficient documentation

## 2023-09-03 DIAGNOSIS — Z803 Family history of malignant neoplasm of breast: Secondary | ICD-10-CM | POA: Diagnosis not present

## 2023-09-03 MED ORDER — DIPHENHYDRAMINE HCL 25 MG PO CAPS
25.0000 mg | ORAL_CAPSULE | Freq: Once | ORAL | Status: AC
  Administered 2023-09-03: 25 mg via ORAL
  Filled 2023-09-03: qty 1

## 2023-09-03 MED ORDER — HEPARIN SOD (PORK) LOCK FLUSH 100 UNIT/ML IV SOLN
500.0000 [IU] | Freq: Once | INTRAVENOUS | Status: AC | PRN
Start: 2023-09-03 — End: 2023-09-03
  Administered 2023-09-03: 500 [IU]

## 2023-09-03 MED ORDER — SODIUM CHLORIDE 0.9% FLUSH
10.0000 mL | INTRAVENOUS | Status: DC | PRN
  Administered 2023-09-03: 10 mL

## 2023-09-03 MED ORDER — ACETAMINOPHEN 325 MG PO TABS
650.0000 mg | ORAL_TABLET | Freq: Once | ORAL | Status: AC
  Administered 2023-09-03: 650 mg via ORAL
  Filled 2023-09-03: qty 2

## 2023-09-03 MED ORDER — TRASTUZUMAB-DKST CHEMO 150 MG IV SOLR
6.0000 mg/kg | Freq: Once | INTRAVENOUS | Status: AC
  Administered 2023-09-03: 420 mg via INTRAVENOUS
  Filled 2023-09-03: qty 20

## 2023-09-03 MED ORDER — SODIUM CHLORIDE 0.9 % IV SOLN: Freq: Once | INTRAVENOUS | Status: AC

## 2023-09-03 NOTE — Progress Notes (Signed)
Per Pamelia Hoit, MD, okay to proceed with treatment today with ECHO from 05/29/23 with EF of 60-65%. New ECHO scheduled for 09/17/23.

## 2023-09-03 NOTE — Patient Instructions (Signed)
[facility]  Discharge Instructions: Thank you for choosing [facility] to provide your oncology and hematology care.   If you have a lab appointment with the Cancer Center, please go directly to the Cancer Center and check in at the registration area.   Wear comfortable clothing and clothing appropriate for easy access to any Portacath or PICC line.   We strive to give you quality time with your provider. You may need to reschedule your appointment if you arrive late (15 or more minutes).  Arriving late affects you and other patients whose appointments are after yours.  Also, if you miss three or more appointments without notifying the office, you may be dismissed from the clinic at the provider's discretion.      For prescription refill requests, have your pharmacy contact our office and allow 72 hours for refills to be completed.    Today you received the following chemotherapy and/or immunotherapy agents: Ogivri      To help prevent nausea and vomiting after your treatment, we encourage you to take your nausea medication as directed.  BELOW ARE SYMPTOMS THAT SHOULD BE REPORTED IMMEDIATELY: *FEVER GREATER THAN 100.4 F (38 C) OR HIGHER *CHILLS OR SWEATING *NAUSEA AND VOMITING THAT IS NOT CONTROLLED WITH YOUR NAUSEA MEDICATION *UNUSUAL SHORTNESS OF BREATH *UNUSUAL BRUISING OR BLEEDING *URINARY PROBLEMS (pain or burning when urinating, or frequent urination) *BOWEL PROBLEMS (unusual diarrhea, constipation, pain near the anus) TENDERNESS IN MOUTH AND THROAT WITH OR WITHOUT PRESENCE OF ULCERS (sore throat, sores in mouth, or a toothache) UNUSUAL RASH, SWELLING OR PAIN  UNUSUAL VAGINAL DISCHARGE OR ITCHING   Items with * indicate a potential emergency and should be followed up as soon as possible or go to the Emergency Department if any problems should occur.  Please show the CHEMOTHERAPY ALERT CARD or IMMUNOTHERAPY  ALERT CARD at check-in to the Emergency Department and triage nurse.  Should you have questions after your visit or need to cancel or reschedule your appointment, please contact [facility] - [facility]  Dept: [phone]  and follow the prompts.  Office hours are 8:00 a.m. to 4:30 p.m. Monday - Friday. Please note that voicemails left after 4:00 p.m. may not be returned until the following business day.  We are closed weekends and major holidays. You have access to a nurse at all times for urgent questions. Please call the main number to the clinic Dept: [phone] and follow the prompts.   For any non-urgent questions, you may also contact your provider using MyChart. We now offer e-Visits for anyone 65 and older to request care online for non-urgent symptoms. For details visit [email or web address].   Also download the MyChart app! Go to the app store, search "MyChart", open the app, select [place], and log in with your MyChart username and password.

## 2023-09-06 ENCOUNTER — Ambulatory Visit (HOSPITAL_COMMUNITY)
Admission: RE | Admit: 2023-09-06 | Discharge: 2023-09-06 | Disposition: A | Discharge status: Home / Self Care | Payer: Medicare Other | Source: Ambulatory Visit | Attending: Hematology and Oncology | Admitting: Hematology and Oncology

## 2023-09-06 DIAGNOSIS — C799 Secondary malignant neoplasm of unspecified site: Secondary | ICD-10-CM | POA: Insufficient documentation

## 2023-09-06 DIAGNOSIS — I6782 Cerebral ischemia: Secondary | ICD-10-CM | POA: Diagnosis not present

## 2023-09-06 DIAGNOSIS — R9089 Other abnormal findings on diagnostic imaging of central nervous system: Secondary | ICD-10-CM | POA: Diagnosis not present

## 2023-09-06 MED ORDER — GADOBUTROL 1 MMOL/ML IV SOLN
7.0000 mL | Freq: Once | INTRAVENOUS | Status: AC | PRN
  Administered 2023-09-06: 7 mL via INTRAVENOUS

## 2023-09-11 ENCOUNTER — Inpatient Hospital Stay (HOSPITAL_BASED_OUTPATIENT_CLINIC_OR_DEPARTMENT_OTHER): Payer: Medicare Other | Admitting: Internal Medicine

## 2023-09-11 VITALS — BP 172/85 | HR 64 | Temp 97.5°F | Resp 18 | Wt 151.8 lb

## 2023-09-11 DIAGNOSIS — E236 Other disorders of pituitary gland: Secondary | ICD-10-CM | POA: Insufficient documentation

## 2023-09-11 DIAGNOSIS — Z9012 Acquired absence of left breast and nipple: Secondary | ICD-10-CM | POA: Diagnosis not present

## 2023-09-11 DIAGNOSIS — Z171 Estrogen receptor negative status [ER-]: Secondary | ICD-10-CM | POA: Diagnosis not present

## 2023-09-11 DIAGNOSIS — C50412 Malignant neoplasm of upper-outer quadrant of left female breast: Secondary | ICD-10-CM | POA: Diagnosis not present

## 2023-09-11 DIAGNOSIS — Z5112 Encounter for antineoplastic immunotherapy: Secondary | ICD-10-CM | POA: Diagnosis not present

## 2023-09-11 DIAGNOSIS — C7951 Secondary malignant neoplasm of bone: Secondary | ICD-10-CM | POA: Diagnosis not present

## 2023-09-11 DIAGNOSIS — Z79899 Other long term (current) drug therapy: Secondary | ICD-10-CM | POA: Diagnosis not present

## 2023-09-11 NOTE — Progress Notes (Signed)
[facility] [address]  [place], [place] [address] [phone]   New Patient Evaluation  Date of Service: 09/11/23 Patient Name: Allison Pierce Patient MRN: [id number] Patient DOB: [birth year] Provider: Henreitta Leber, MD  Identifying Statement:  Allison Pierce is a 77 y.o. female with Pituitary mass [facility]) who presents for initial consultation and evaluation regarding cancer associated neurologic deficits.    Referring Provider: Lupita Raider, MD [address] [place],  [place] [address]  Primary Cancer:  Oncologic History: Oncology History  Malignant neoplasm of upper-outer quadrant of left breast in female, estrogen receptor negative (HCC)  02/10/2022 Initial Diagnosis   Palpable left breast masses and calcifications: 1.8 cm at 1:00 and 1.7 cm at 9:00, calcifications not biopsy.  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 Report date is 03/05/2022.  The CustomNext-Cancer+RNAinsight panel offered by [facility] includes sequencing and rearrangement analysis for the following 48 genes:  APC, ATM, AXIN2, BARD1, BMPR1A, BRCA1, BRCA2, BRIP1, CDH1, CDK4, CDKN2A, CHEK2, CTNNA1, DICER1, EGFR, EPCAM, GREM1, HOXB13, KIT, MEN1, MLH1, MSH2, MSH3, MSH6, MUTYH, NBN, NF1, NTHL1, PALB2, PDGFRA, PMS2, POLD1, POLE, PTEN, RAD50, RAD51C, RAD51D, SDHA, SDHB, SDHC, SDHD, SMAD4, SMARCA4, STK11, TP53, TSC1, TSC2, and VHL.  RNA data is routinely analyzed for use in variant interpretation for all genes.   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, LCIS, 5/5 lymph nodes positive with extranodal extension, margins negative ER 0%, PR 0%, HER2 3+, Ki-67 20%   04/27/2022 Cancer Staging   Staging form: Breast, AJCC 8th Edition - Pathologic: Stage IIIA (pT2, pN2, cM0, G2, ER-, PR-, HER2+) - Signed by Serena Croissant, MD on 04/27/2022 Histologic grading system: 3 grade system   05/05/2022 - 05/26/2022 Chemotherapy   Patient is on Treatment Plan : BREAST DOCEtaxel + Trastuzumab + Pertuzumab (THP) q21d x 8 cycles / Trastuzumab + Pertuzumab q21d x 4 cycles     05/26/2022 -  Chemotherapy   Patient is on Treatment Plan : BREAST MAINTENANCE Trastuzumab IV (6) or SQ (600) D1 q21d X 11 Cycles     06/12/2023 Cancer Staging   Staging form: Breast, AJCC 8th Edition - Pathologic: Stage IV (cM1) - Signed by [name], NP on 06/12/2023     History of Present Illness: The patient's records from the referring physician were obtained and reviewed and the patient interviewed to confirm this HPI.  Allison Pierce presents today to review recent MRI brain findings.  She underwent MRI study in October given feelings of "imbalance" which started soon after beginning treatment for breast cancer.  This is sporadic, does not limit her ability to walk for the most part.  No falls.  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  No new or progressive complaints.  Medications: Current Outpatient Medications on File Prior to Visit  Medication Sig Dispense Refill   B Complex-C (B COMPLEX-VITAMIN C) CAPS Take 1 capsule by mouth daily.     buPROPion (WELLBUTRIN XL) 300 MG  24 hr tablet Take 300 mg by mouth every morning.     cholecalciferol (VITAMIN D3) 25 MCG (1000 UNIT) tablet Take 1,000 Units by mouth daily. (Patient not taking: Reported on 08/03/2023)     clonazePAM (KLONOPIN) 1 MG tablet Take 1 mg by mouth at bedtime.     Cyanocobalamin (VITAMIN B-12)  5000 MCG TBDP Take 5,000 Units by mouth daily. (Patient not taking: Reported on 08/03/2023)     diphenhydrAMINE (BENADRYL) 25 MG tablet Take 25 mg by mouth in the morning.     fluticasone (FLONASE) 50 MCG/ACT nasal spray Place 1 spray into both nostrils daily as needed for allergies.     gabapentin (NEURONTIN) 100 MG capsule Take 1 capsule (100 mg total) by mouth at bedtime. (Patient not taking: Reported on 08/03/2023) 14 capsule 0   lidocaine-prilocaine (EMLA) cream Apply 1 Application topically as needed. Apply one application daily as needed to port a cath site. 30 g 3   losartan (COZAAR) 100 MG tablet Take 1 tablet (100 mg total) by mouth daily. 90 tablet 3   meloxicam (MOBIC) 15 MG tablet Take 15 mg by mouth daily. (Patient not taking: Reported on 08/03/2023)     methylphenidate (RITALIN) 5 MG tablet Take 5 mg by mouth 2 (two) times daily as needed.     pantoprazole (PROTONIX) 40 MG tablet Take 1 tablet (40 mg total) by mouth 2 (two) times daily. 90 tablet 3   Potassium 99 MG TABS Take 99 mg by mouth at bedtime. (Patient not taking: Reported on 08/03/2023)     traZODone (DESYREL) 150 MG tablet Take 225 mg by mouth at bedtime.     venlafaxine XR (EFFEXOR-XR) 75 MG 24 hr capsule Take 75 mg by mouth daily with breakfast.     [DISCONTINUED] prochlorperazine (COMPAZINE) 10 MG tablet Take 1 tablet (10 mg total) by mouth every 6 (six) hours as needed (Nausea or vomiting). (Patient not taking: Reported on 05/12/2022) 30 tablet 1   No current facility-administered medications on file prior to visit.    Allergies:  Allergies  Allergen Reactions   Oxybutynin Other (See Comments)    Unknown per Pt    Prozac [Fluoxetine Hcl] Other (See Comments)    "Could not lift head up or get out of bed"    Ritalin [Methylphenidate] Other (See Comments)    Unknown per Pt    Rosuvastatin Other (See Comments)    Unknown per Pt    Lyrica [Pregabalin] Anxiety and Other (See Comments)    "Felt weird"   Past Medical  History:  Past Medical History:  Diagnosis Date   Anxiety    C. difficile diarrhea    Depression    GERD (gastroesophageal reflux disease) OCCASIONALLY  TAKE TUMS   Human papilloma virus    Hyperlipidemia    Hypertension    left breast ca 12/2021   UC (ulcerative colitis) (HCC)    UTI (urinary tract infection)    Vulvar lesion    Past Surgical History:  Past Surgical History:  Procedure Laterality Date   ABDOMINAL HYSTERECTOMY  1992   W/ SALPINGO-OOPHORECTOMY   ANTERIOR CERVICAL DECOMP/DISCECTOMY FUSION  01-18-2009  DR POOLE   C4  - C6   AUGMENTATION MAMMAPLASTY Bilateral    BREAST BIOPSY Right    BREAST BIOPSY Left 02/10/2022   x2   BREAST ENHANCEMENT SURGERY  2011   BREAST EXCISIONAL BIOPSY Left    BREAST RECONSTRUCTION WITH PLACEMENT OF TISSUE EXPANDER AND FLEX HD (ACELLULAR HYDRATED DERMIS) Left  04/07/2022   Procedure: BREAST RECONSTRUCTION WITH PLACEMENT OF TISSUE EXPANDER AND FLEX HD (ACELLULAR HYDRATED DERMIS);  Surgeon: Allena Napoleon, MD;  Location: [facility] OR;  Service: Plastics;  Laterality: Left;   BREAST SURGERY  02-04-2011  dr Jamey Ripa   EXCISION LEFT BREAST MASS--  CALCIFICATION   EYE SURGERY Bilateral    cataract removal   MASTECTOMY W/ SENTINEL NODE BIOPSY Left 04/07/2022   Procedure: LEFT MASTECTOMY WITH SENTINEL NODE BIOPSY;  Surgeon: Abigail Miyamoto, MD;  Location: [facility] OR;  Service: General;  Laterality: Left;  LMA   PORTACATH PLACEMENT N/A 04/07/2022   Procedure: PORT-A-CATH INSERTION WITH ULTRASOUND GUIDANCE;  Surgeon: Abigail Miyamoto, MD;  Location: [facility] OR;  Service: General;  Laterality: N/A;   REMOVAL OF TISSUE EXPANDER AND PLACEMENT OF IMPLANT Left 03/16/2023   Procedure: Removal of left breast tissue expander and placement of permanent implant;  Surgeon: [name], MD;  Location: [facility];  Service: Plastics;  Laterality: Left;   VULVAR LESION REMOVAL  09/10/2012   Procedure: VULVAR LESION;  Surgeon: Gretta Cool, MD;  Location:  [facility];  Service: Gynecology;  Laterality: N/A;  WIDE EXCISION OF VULVAR LESION   Social History:  Social History   Socioeconomic History   Marital status: Married    Spouse name: Fayrene Fearing   Number of children: 1   Years of education: 12   Highest education level: Not on file  Occupational History   Occupation: retired  Tobacco Use   Smoking status: Never   Smokeless tobacco: Never  Vaping Use   Vaping status: Never Used  Substance and Sexual Activity   Alcohol use: Yes    Alcohol/week: 7.0 standard drinks of alcohol    Types: 7 Cans of beer per week   Drug use: Never   Sexual activity: Not Currently  Other Topics Concern   Not on file  Social History Narrative   Graduated HS      Right handed      Lives with husband   Social Determinants of Health   Financial Resource Strain: Low Risk  (02/22/2022)   Overall Financial Resource Strain (CARDIA)    Difficulty of Paying Living Expenses: Not hard at all  Food Insecurity: No Food Insecurity (02/22/2022)   Hunger Vital Sign    Worried About Running Out of Food in the Last Year: Never true    Ran Out of Food in the Last Year: Never true  Transportation Needs: No Transportation Needs (02/22/2022)   PRAPARE - Administrator, Civil Service (Medical): No    Lack of Transportation (Non-Medical): No  Physical Activity: Not on file  Stress: Not on file  Social Connections: Not on file  Intimate Partner Violence: Not on file   Family History:  Family History  Problem Relation Age of Onset   Lung cancer Mother 77       she smoked   Prostate cancer Brother 57   Breast cancer Cousin        paternal first cousin   Breast cancer Cousin        paternal first cousin   Liver disease Neg Hx    Esophageal cancer Neg Hx    Colon cancer Neg Hx     Review of Systems: Constitutional: Doesn't report fevers, chills or abnormal weight loss Eyes: Doesn't report blurriness of vision Ears, nose, mouth,  throat, and face: Doesn't report sore throat Respiratory: Doesn't report cough, dyspnea or wheezes Cardiovascular: Doesn't report  palpitation, chest discomfort  Gastrointestinal:  Doesn't report nausea, constipation, diarrhea GU: Doesn't report incontinence Skin: Doesn't report skin rashes Neurological: Per HPI Musculoskeletal: Doesn't report joint pain Behavioral/Psych: Doesn't report anxiety  Physical Exam: Vitals:   09/11/23 1040  BP: (!) 172/85  Pulse: 64  Resp: 18  Temp: (!) 97.5 F (36.4 C)  SpO2: 99%   KPS: 90. General: Alert, cooperative, pleasant, in no acute distress Head: Normal EENT: No conjunctival injection or scleral icterus.  Lungs: Resp effort normal Cardiac: Regular rate Abdomen: Non-distended abdomen Skin: No rashes cyanosis or petechiae. Extremities: No clubbing or edema  Neurologic Exam: Mental Status: Awake, alert, attentive to examiner. Oriented to self and environment. Language is fluent with intact comprehension.  Cranial Nerves: Visual acuity is grossly normal. Visual fields are full. Extra-ocular movements intact. No ptosis. Face is symmetric Motor: Tone and bulk are normal. Power is full in both arms and legs. Reflexes are symmetric, no pathologic reflexes present.  Sensory: Intact to light touch Gait: Normal.   Labs: I have reviewed the data as listed    Component Value Date/Time   NA 138 07/23/2023 1103   K 4.2 07/23/2023 1103   CL 105 07/23/2023 1103   CO2 29 07/23/2023 1103   GLUCOSE 99 07/23/2023 1103   BUN 10 07/23/2023 1103   CREATININE 1.03 (H) 07/23/2023 1103   CALCIUM 9.0 07/23/2023 1103   PROT 6.2 (L) 07/23/2023 1103   ALBUMIN 3.9 07/23/2023 1103   AST 18 07/23/2023 1103   ALT 15 07/23/2023 1103   ALKPHOS 73 07/23/2023 1103   BILITOT 0.6 07/23/2023 1103   GFRNONAA 56 (L) 07/23/2023 1103   GFRAA  01/13/2009 1007    >60        The eGFR has been calculated using the MDRD equation. This calculation has not been validated  in all clinical situations. eGFR's persistently <60 mL/min signify possible Chronic Kidney Disease.   Lab Results  Component Value Date   WBC 6.4 07/23/2023   NEUTROABS 4.7 07/23/2023   HGB 11.2 (L) 07/23/2023   HCT 34.3 (L) 07/23/2023   MCV 84.7 07/23/2023   PLT 276 07/23/2023    Imaging:  No results found.  [facility] Clinician Interpretation: I have personally reviewed the radiological images as listed.  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   One month follow up demonstrates no meaningful growth, pending official read.  This is most consistent with microadenoma.    Serum pituitary labs are within normal limits, so if adenoma this is likely non-secreting.  Visual fields are normal.  Recommended following up in 1 year with new set of imaging, no intervention at this time.  She is agreeable with this.  We spent twenty additional minutes teaching regarding the natural history, biology, and historical experience in the treatment of neurologic complications of cancer.   We appreciate the opportunity to participate in the care of Allison Pierce.   We ask that Allison Pierce return to clinic in 12 months following next brain MRI, or sooner as needed.  All questions were answered. The patient knows to call the clinic with any problems, questions or concerns. No barriers to learning were detected.  The total time spent in the encounter was 40 minutes and more than 50% was on counseling and review of test results  Henreitta Leber, MD Medical Director of Neuro-Oncology [facility] 09/11/23 10:49 AM

## 2023-09-17 ENCOUNTER — Ambulatory Visit (HOSPITAL_COMMUNITY)
Admission: RE | Admit: 2023-09-17 | Discharge: 2023-09-17 | Disposition: A | Discharge status: Home / Self Care | Payer: Medicare Other | Source: Ambulatory Visit | Attending: Hematology and Oncology | Admitting: Hematology and Oncology

## 2023-09-17 ENCOUNTER — Encounter (HOSPITAL_COMMUNITY): Payer: Self-pay | Admitting: Internal Medicine

## 2023-09-17 ENCOUNTER — Ambulatory Visit (HOSPITAL_COMMUNITY)
Admission: RE | Admit: 2023-09-17 | Discharge: 2023-09-17 | Disposition: A | Discharge status: Home / Self Care | Payer: Medicare Other | Source: Ambulatory Visit | Attending: Internal Medicine | Admitting: Internal Medicine

## 2023-09-17 VITALS — BP 164/86 | HR 69 | Ht 66.0 in | Wt 149.6 lb

## 2023-09-17 DIAGNOSIS — K5730 Diverticulosis of large intestine without perforation or abscess without bleeding: Secondary | ICD-10-CM | POA: Diagnosis not present

## 2023-09-17 DIAGNOSIS — Z0189 Encounter for other specified special examinations: Secondary | ICD-10-CM | POA: Diagnosis not present

## 2023-09-17 DIAGNOSIS — I110 Hypertensive heart disease with heart failure: Secondary | ICD-10-CM | POA: Diagnosis not present

## 2023-09-17 DIAGNOSIS — I10 Essential (primary) hypertension: Secondary | ICD-10-CM

## 2023-09-17 DIAGNOSIS — C801 Malignant (primary) neoplasm, unspecified: Secondary | ICD-10-CM | POA: Diagnosis not present

## 2023-09-17 DIAGNOSIS — Z171 Estrogen receptor negative status [ER-]: Secondary | ICD-10-CM

## 2023-09-17 DIAGNOSIS — C50212 Malignant neoplasm of upper-inner quadrant of left female breast: Secondary | ICD-10-CM | POA: Diagnosis not present

## 2023-09-17 DIAGNOSIS — Z9882 Breast implant status: Secondary | ICD-10-CM | POA: Diagnosis not present

## 2023-09-17 DIAGNOSIS — I700 Atherosclerosis of aorta: Secondary | ICD-10-CM | POA: Diagnosis not present

## 2023-09-17 DIAGNOSIS — C7951 Secondary malignant neoplasm of bone: Secondary | ICD-10-CM | POA: Diagnosis not present

## 2023-09-17 DIAGNOSIS — I340 Nonrheumatic mitral (valve) insufficiency: Secondary | ICD-10-CM | POA: Diagnosis not present

## 2023-09-17 DIAGNOSIS — I5032 Chronic diastolic (congestive) heart failure: Secondary | ICD-10-CM

## 2023-09-17 DIAGNOSIS — Z9012 Acquired absence of left breast and nipple: Secondary | ICD-10-CM | POA: Insufficient documentation

## 2023-09-17 DIAGNOSIS — C50412 Malignant neoplasm of upper-outer quadrant of left female breast: Secondary | ICD-10-CM | POA: Diagnosis not present

## 2023-09-17 LAB — ECHOCARDIOGRAM COMPLETE
AR max vel: 2.35 cm2
AV Area VTI: 2.27 cm2
AV Area mean vel: 2.25 cm2
AV Mean grad: 2.5 mm[Hg]
AV Peak grad: 4.8 mm[Hg]
Ao pk vel: 1.09 m/s
Area-P 1/2: 3.68 cm2
S' Lateral: 3.1 cm

## 2023-09-17 MED ORDER — SPIRONOLACTONE 25 MG PO TABS: 12.5000 mg | ORAL_TABLET | Freq: Every day | ORAL | 6 refills | Status: DC

## 2023-09-17 MED ORDER — IOHEXOL 350 MG/ML SOLN
75.0000 mL | Freq: Once | INTRAVENOUS | Status: AC | PRN
  Administered 2023-09-17: 75 mL via INTRAVENOUS

## 2023-09-17 NOTE — Addendum Note (Signed)
Encounter addended by: Noralee Space, RN on: 09/17/2023 2:34 PM  Actions taken: Pharmacy for encounter modified, Order list changed

## 2023-09-17 NOTE — Patient Instructions (Signed)
Medication Changes:  START Spironolactone 12.5 mg (1/2 tab) Daily  Lab Work:  Your physician recommends that you return for lab work in: 2 weeks  Testing/Procedures:  Your physician has requested that you have an echocardiogram. Echocardiography is a painless test that uses sound waves to create images of your heart. It provides your doctor with information about the size and shape of your heart and how well your heart's chambers and valves are working. This procedure takes approximately one hour. There are no restrictions for this procedure. Please do NOT wear cologne, perfume, aftershave, or lotions (deodorant is allowed). Please arrive 15 minutes prior to your appointment time. IN 3 MONTHS  Please note: We ask at that you not bring children with you during ultrasound (echo/ vascular) testing. Due to room size and safety concerns, children are not allowed in the ultrasound rooms during exams. Our front office staff cannot provide observation of children in our lobby area while testing is being conducted. An adult accompanying a patient to their appointment will only be allowed in the ultrasound room at the discretion of the ultrasound technician under special circumstances. We apologize for any inconvenience.   Special Instructions // Education:  Do the following things EVERYDAY: Weigh yourself in the morning before breakfast. Write it down and keep it in a log. Take your medicines as prescribed Eat low salt foods--Limit salt (sodium) to 2000 mg per day.  Stay as active as you can everyday Limit all fluids for the day to less than 2 liters   Follow-Up in: 3 months with an echocardiogram   At the [facility], you and your health needs are our priority. We have a designated team specialized in the treatment of Heart Failure. This Care Team includes your primary Heart Failure Specialized Cardiologist (physician), Advanced Practice Providers (APPs- Physician Assistants  and Nurse Practitioners), and Pharmacist who all work together to provide you with the care you need, when you need it.   You may see any of the following providers on your designated Care Team at your next follow up:  Dr. Arvilla Meres Dr. Marca Ancona Dr. Dorthula Nettles Dr. Theresia Bough Tonye Becket, NP Robbie Lis, [place] [facility] [place], [place] Brynda Peon, NP [place] Lee, NP Karle Plumber, PharmD   Please be sure to bring in all your medications bottles to every appointment.   Need to Contact us:  If you have any questions or concerns before your next appointment please send [place] a message through [employer] or call our office at [phone].    TO LEAVE A MESSAGE FOR THE NURSE SELECT OPTION 2, PLEASE LEAVE A MESSAGE INCLUDING: YOUR NAME DATE OF BIRTH CALL BACK NUMBER REASON FOR CALL**this is important as we prioritize the call backs  YOU WILL RECEIVE A CALL BACK THE SAME DAY AS LONG AS YOU CALL BEFORE 4:00 PM

## 2023-09-17 NOTE — Progress Notes (Signed)
CARDIO-ONCOLOGY CLINIC NOTE  Referring Physician: Dr. Pamelia Hoit Primary Care: Lupita Raider, MD Primary Cardiologist: None   HPI:  Allison Pierce is a 77 y.o. female with HTN, HL, anxiety and left breast cancer referred by Dr. Pamelia Hoit for further evaluation of reduced EF on echocardiogram.   No previous cardiac history. Was diagnosed with left breast CA in 5/23. Stage 1A (ER/PR -, HER2+). Underwent left mastectomy 7/23.   Chemo: 8/23: Docetaxel + Trastuzumab + Pertuzumab (THP) q21d x 8 cycles / Trastuzumab + Pertuzumab q21d x 4 cycles  Maintenance: Trastuzumab IV (6) or SQ (600) D1 q21d X 11 Cycles   Echos: 03/01/22: EF 60-65% G2DD GLs -18.6% 06/26/22:  EF 55-60% GLS -14.1% 09/21/22 EF 60-65%   Echo 02/12/23 EF 60-65% Personally reviewed  Here for routine f/u. Remains on [name] every 3 weeks. Tolerating well.  Perjeta stopped 05/21/23 due to oral ulcer. Tolerating losartan and Toprol. Denies CP, SOB or edema.   Echo today 09/17/23 EF 60-65%   Echo 05/29/23 EF 60-65% Personally reviewed  Past Medical History:  Diagnosis Date   Anxiety    C. difficile diarrhea    Depression    GERD (gastroesophageal reflux disease) OCCASIONALLY  TAKE TUMS   Human papilloma virus    Hyperlipidemia    Hypertension    left breast ca 12/2021   UC (ulcerative colitis) (HCC)    UTI (urinary tract infection)    Vulvar lesion     Current Outpatient Medications  Medication Sig Dispense Refill   B Complex-C (B COMPLEX-VITAMIN C) CAPS Take 1 capsule by mouth daily.     cholecalciferol (VITAMIN D3) 25 MCG (1000 UNIT) tablet Take 1,000 Units by mouth daily.     clonazePAM (KLONOPIN) 1 MG tablet Take 1 mg by mouth at bedtime.     Cyanocobalamin (VITAMIN B-12) 5000 MCG TBDP Take 5,000 Units by mouth daily.     diphenhydrAMINE (BENADRYL) 25 MG tablet Take 25 mg by mouth in the morning.     fluticasone (FLONASE) 50 MCG/ACT nasal spray Place 1 spray into both nostrils daily as needed for allergies.      gabapentin (NEURONTIN) 100 MG capsule Take 1 capsule (100 mg total) by mouth at bedtime. 14 capsule 0   lidocaine-prilocaine (EMLA) cream Apply 1 Application topically as needed. Apply one application daily as needed to port a cath site. 30 g 3   losartan (COZAAR) 100 MG tablet Take 1 tablet (100 mg total) by mouth daily. 90 tablet 3   pantoprazole (PROTONIX) 40 MG tablet Take 1 tablet (40 mg total) by mouth 2 (two) times daily. 90 tablet 3   traZODone (DESYREL) 150 MG tablet Take 225 mg by mouth at bedtime.     venlafaxine XR (EFFEXOR-XR) 75 MG 24 hr capsule Take 75 mg by mouth daily with breakfast.     No current facility-administered medications for this encounter.    Allergies  Allergen Reactions   Oxybutynin Other (See Comments)    Unknown per Pt    Prozac [Fluoxetine Hcl] Other (See Comments)    "Could not lift head up or get out of bed"    Ritalin [Methylphenidate] Other (See Comments)    Unknown per Pt    Rosuvastatin Other (See Comments)    Unknown per Pt    Lyrica [Pregabalin] Anxiety and Other (See Comments)    "Felt weird"      Social History   Socioeconomic History   Marital status: Married    Spouse name: Fayrene Fearing  Number of children: 1   Years of education: 12   Highest education level: Not on file  Occupational History   Occupation: retired  Tobacco Use   Smoking status: Never   Smokeless tobacco: Never  Vaping Use   Vaping status: Never Used  Substance and Sexual Activity   Alcohol use: Yes    Alcohol/week: 7.0 standard drinks of alcohol    Types: 7 Cans of beer per week   Drug use: Never   Sexual activity: Not Currently  Other Topics Concern   Not on file  Social History Narrative   Graduated HS      Right handed      Lives with husband   Social Drivers of Health   Financial Resource Strain: Low Risk  (02/22/2022)   Overall Financial Resource Strain (CARDIA)    Difficulty of Paying Living Expenses: Not hard at all  Food Insecurity: No Food  Insecurity (02/22/2022)   Hunger Vital Sign    Worried About Running Out of Food in the Last Year: Never true    Ran Out of Food in the Last Year: Never true  Transportation Needs: No Transportation Needs (02/22/2022)   PRAPARE - Administrator, Civil Service (Medical): No    Lack of Transportation (Non-Medical): No  Physical Activity: Not on file  Stress: Not on file  Social Connections: Not on file  Intimate Partner Violence: Not on file      Family History  Problem Relation Age of Onset   Lung cancer Mother 50       she smoked   Prostate cancer Brother 12   Breast cancer Cousin        paternal first cousin   Breast cancer Cousin        paternal first cousin   Liver disease Neg Hx    Esophageal cancer Neg Hx    Colon cancer Neg Hx     Vitals:   09/17/23 1347  BP: (!) 164/86  Pulse: 69  SpO2: 99%  Weight: 67.9 kg (149 lb 9.6 oz)  Height: 5\' 6"  (1.676 m)     PHYSICAL EXAM: General:  Well appearing. No resp difficulty HEENT: normal Neck: supple. no JVD. Carotids 2+ bilat; no bruits. No lymphadenopathy or thryomegaly appreciated. Cor: PMI nondisplaced. Regular rate & rhythm. No rubs, gallops or murmurs. Lungs: clear Abdomen: soft, nontender, nondistended. No hepatosplenomegaly. No bruits or masses. Good bowel sounds. Extremities: no cyanosis, clubbing, rash, edema Neuro: alert & orientedx3, cranial nerves grossly intact. moves all 4 extremities w/o difficulty. Affect pleasant  ASSESSMENT & PLAN:  1. Abnormal echocardiogram, potential herceptin cardiotoxicity - Echo 03/01/22: EF 60-65% G2DD GLs -18.6% - Echo 06/26/22: EF 55-60% GLS -14.1% - I have reviewed both her pre-chemo echocardiogram and her most recent echo side-by-side. Although EF on the most recent one may be slightly less vigorous than her pre-chemo echo it is still well within the normal range. Strain imaging is not ideal due to poor endocardial tracking on most recent echo but also suggests a  possible slight decrease in contractile force - Although changes are minor it does fit the time course for potential herceptin cardiotoxicity.  - Echo 09/21/22 EF 60-65% (completely normal)  - Echo 09/21/22 EF 60-65%  - Echo 02/12/23 EF 60-65% - Echo  05/29/23 EF 60-65% Personally reviewed -> ok to continue [name] - Continue losartan and Toprol  - Start spiro for HTN  - Continue echo q3 months while on [name]  2. Left  Breast Cancer, stage IV with bony mets - diagnosed 5/23. Stage 1A (ER/PR -, HER2+).  - s/p left mastectomy 7/23.  - s/p 8/23: Docetaxel + Trastuzumab + Pertuzumab (THP) q21d x 8 cycles / Trastuzumab + Pertuzumab q21d x 4 cycles  - s/pTrastuzumab IV (6) or SQ (600) D1 q21d X 11 Cycles  - Echo today EF 60-65% Continue [name]   3. Hypertension - Blood pressure high - will add spiro 12.5 (breast CA was ER 0% so should be ok). Check BMET in 2 weeks  Allison Meres, MD  2:21 PM

## 2023-09-17 NOTE — Addendum Note (Signed)
Encounter addended by: Noralee Space, RN on: 09/17/2023 2:31 PM  Actions taken: Visit diagnoses modified, Order list changed, Diagnosis association updated, Clinical Note Signed

## 2023-09-18 ENCOUNTER — Other Ambulatory Visit: Payer: Self-pay

## 2023-09-24 ENCOUNTER — Inpatient Hospital Stay (HOSPITAL_BASED_OUTPATIENT_CLINIC_OR_DEPARTMENT_OTHER): Payer: Medicare Other | Admitting: Hematology and Oncology

## 2023-09-24 ENCOUNTER — Inpatient Hospital Stay: Payer: Medicare Other

## 2023-09-24 VITALS — BP 143/72 | HR 79 | Temp 97.7°F | Resp 18 | Ht 66.0 in | Wt 151.0 lb

## 2023-09-24 DIAGNOSIS — F3341 Major depressive disorder, recurrent, in partial remission: Secondary | ICD-10-CM | POA: Diagnosis not present

## 2023-09-24 DIAGNOSIS — C50412 Malignant neoplasm of upper-outer quadrant of left female breast: Secondary | ICD-10-CM

## 2023-09-24 DIAGNOSIS — E782 Mixed hyperlipidemia: Secondary | ICD-10-CM | POA: Diagnosis not present

## 2023-09-24 DIAGNOSIS — Z79899 Other long term (current) drug therapy: Secondary | ICD-10-CM | POA: Diagnosis not present

## 2023-09-24 DIAGNOSIS — Z171 Estrogen receptor negative status [ER-]: Secondary | ICD-10-CM

## 2023-09-24 DIAGNOSIS — C7951 Secondary malignant neoplasm of bone: Secondary | ICD-10-CM | POA: Diagnosis not present

## 2023-09-24 DIAGNOSIS — C50919 Malignant neoplasm of unspecified site of unspecified female breast: Secondary | ICD-10-CM | POA: Diagnosis not present

## 2023-09-24 DIAGNOSIS — Z5112 Encounter for antineoplastic immunotherapy: Secondary | ICD-10-CM | POA: Diagnosis not present

## 2023-09-24 DIAGNOSIS — I10 Essential (primary) hypertension: Secondary | ICD-10-CM | POA: Diagnosis not present

## 2023-09-24 DIAGNOSIS — Z9012 Acquired absence of left breast and nipple: Secondary | ICD-10-CM | POA: Diagnosis not present

## 2023-09-24 DIAGNOSIS — I427 Cardiomyopathy due to drug and external agent: Secondary | ICD-10-CM | POA: Diagnosis not present

## 2023-09-24 DIAGNOSIS — R7301 Impaired fasting glucose: Secondary | ICD-10-CM | POA: Diagnosis not present

## 2023-09-24 MED ORDER — ACETAMINOPHEN 325 MG PO TABS
650.0000 mg | ORAL_TABLET | Freq: Once | ORAL | Status: AC
  Administered 2023-09-24: 650 mg via ORAL
  Filled 2023-09-24: qty 2

## 2023-09-24 MED ORDER — SODIUM CHLORIDE 0.9% FLUSH
10.0000 mL | INTRAVENOUS | Status: DC | PRN
  Administered 2023-09-24: 10 mL

## 2023-09-24 MED ORDER — SODIUM CHLORIDE 0.9 % IV SOLN: Freq: Once | INTRAVENOUS | Status: AC

## 2023-09-24 MED ORDER — TRASTUZUMAB-DKST CHEMO 150 MG IV SOLR
6.0000 mg/kg | Freq: Once | INTRAVENOUS | Status: AC
  Administered 2023-09-24: 420 mg via INTRAVENOUS
  Filled 2023-09-24: qty 20

## 2023-09-24 MED ORDER — DIPHENHYDRAMINE HCL 25 MG PO CAPS
25.0000 mg | ORAL_CAPSULE | Freq: Once | ORAL | Status: AC
  Administered 2023-09-24: 25 mg via ORAL
  Filled 2023-09-24: qty 1

## 2023-09-24 MED ORDER — HEPARIN SOD (PORK) LOCK FLUSH 100 UNIT/ML IV SOLN
500.0000 [IU] | Freq: Once | INTRAVENOUS | Status: AC | PRN
  Administered 2023-09-24: 500 [IU]

## 2023-09-24 NOTE — Progress Notes (Signed)
Patient Care Team: Lupita Raider, MD as PCP - General (Family Medicine) Van Clines, MD as Consulting Physician (Neurology) Pershing Proud, RN as Oncology Nurse Navigator Donnelly Angelica, RN as Oncology Nurse Navigator Abigail Miyamoto, MD as Consulting Physician (General Surgery) Serena Croissant, MD as Consulting Physician (Hematology and Oncology) Lonie Peak, MD as Attending Physician (Radiation Oncology)  DIAGNOSIS:  Encounter Diagnosis  Name Primary?   Malignant neoplasm of upper-outer quadrant of left breast in female, estrogen receptor negative (HCC) Yes    SUMMARY OF ONCOLOGIC HISTORY: Oncology History  Malignant neoplasm of upper-outer quadrant of left breast in female, estrogen receptor negative (HCC)  02/10/2022 Initial Diagnosis   Palpable left breast masses and calcifications: 1.8 cm at 1:00 and 1.7 cm at 9:00, calcifications not biopsy.  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 Report date is 03/05/2022.  The CustomNext-Cancer+RNAinsight panel offered by [facility] includes sequencing and rearrangement analysis for the following 48 genes:  APC, ATM, AXIN2, BARD1, BMPR1A, BRCA1, BRCA2, BRIP1, CDH1, CDK4, CDKN2A, CHEK2, CTNNA1, DICER1, EGFR, EPCAM, GREM1, HOXB13, KIT, MEN1, MLH1, MSH2, MSH3, MSH6, MUTYH, NBN, NF1, NTHL1, PALB2, PDGFRA, PMS2, POLD1, POLE, PTEN, RAD50, RAD51C, RAD51D, SDHA, SDHB, SDHC, SDHD, SMAD4, SMARCA4, STK11, TP53, TSC1, TSC2, and VHL.  RNA data is routinely analyzed for use in variant interpretation for all genes.   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, LCIS, 5/5 lymph nodes positive with extranodal extension, margins negative ER 0%, PR 0%, HER2 3+, Ki-67 20%   04/27/2022 Cancer Staging   Staging form: Breast, AJCC 8th Edition - Pathologic: Stage IIIA (pT2, pN2, cM0, G2, ER-, PR-, HER2+) - Signed by Serena Croissant, MD on 04/27/2022 Histologic grading system: 3 grade system   05/05/2022 - 05/26/2022 Chemotherapy   Patient is on Treatment Plan : BREAST DOCEtaxel + Trastuzumab + Pertuzumab (THP) q21d x 8 cycles / Trastuzumab + Pertuzumab q21d x 4 cycles     05/26/2022 -  Chemotherapy   Patient is on Treatment Plan : BREAST MAINTENANCE Trastuzumab IV (6) or SQ (600) D1 q21d X 11 Cycles     06/12/2023 Cancer Staging   Staging form: Breast, AJCC 8th Edition - Pathologic: Stage IV (cM1) - Signed by [name], NP on 06/12/2023     CHIEF COMPLIANT: Follow-up after recent scans on [name]  HISTORY OF PRESENT ILLNESS:  History of Present Illness   The patient, with a history of cancer, presents for a follow-up visit after recent scans, on herceptin  The patient also discusses her cancer treatment, mentioning that she has spots on her bones where the cancer used to be. She reports experiencing pain in the area of her lower back and tailbone, where one of these spots is located. She is unsure if the pain is due to the spot itself or the muscles around it. She also mentions a spot on her sternum, but reports that it is not active and is scarred.     ALLERGIES:  is allergic to oxybutynin, prozac [fluoxetine hcl], ritalin [methylphenidate], rosuvastatin, and lyrica [pregabalin].  MEDICATIONS:  Current Outpatient Medications  Medication Sig Dispense Refill   B Complex-C (B COMPLEX-VITAMIN C) CAPS Take 1 capsule by mouth daily.     cholecalciferol (VITAMIN D3) 25 MCG (1000 UNIT) tablet Take 1,000 Units by mouth daily.     clonazePAM (KLONOPIN) 1 MG tablet Take 1 mg by  mouth at bedtime.     Cyanocobalamin (VITAMIN B-12) 5000 MCG TBDP Take 5,000 Units by mouth daily.     diphenhydrAMINE (BENADRYL) 25 MG tablet Take 25 mg by mouth in the morning.     fluticasone (FLONASE) 50 MCG/ACT nasal spray Place 1 spray into both nostrils daily as needed for allergies.     gabapentin (NEURONTIN) 100 MG capsule Take 1 capsule (100 mg total) by mouth at bedtime. 14 capsule 0   lidocaine-prilocaine (EMLA) cream Apply 1 Application topically as needed. Apply one application daily as needed to port a cath site. 30 g 3   losartan (COZAAR) 100 MG tablet Take 1 tablet (100 mg total) by mouth daily. 90 tablet 3   pantoprazole (PROTONIX) 40 MG tablet Take 1 tablet (40 mg total) by mouth 2 (two) times daily. 90 tablet 3   spironolactone (ALDACTONE) 25 MG tablet Take 0.5 tablets (12.5 mg total) by mouth daily. 15 tablet 6   traZODone (DESYREL) 150 MG tablet Take 225 mg by mouth at bedtime.     venlafaxine XR (EFFEXOR-XR) 75 MG 24 hr capsule Take 75 mg by mouth daily with breakfast.     No current facility-administered medications for this visit.    PHYSICAL EXAMINATION: ECOG PERFORMANCE STATUS: 1 - Symptomatic but completely ambulatory  Vitals:   09/24/23 0914  BP: (!) 143/72  Pulse: 79  Resp: 18  Temp: 97.7 F (36.5 C)  SpO2: 100%   Filed Weights   09/24/23 0914  Weight: 151 lb (68.5 kg)      LABORATORY DATA:  I have reviewed the data as listed    Latest Ref Rng & Units 07/23/2023   11:03 AM 06/25/2023   10:20 AM 05/10/2023   10:15 AM  CMP  Glucose 70 - 99 mg/dL 99  93  161   BUN 8 - 23 mg/dL 10  8  7    Creatinine 0.44 - 1.00 mg/dL 0.96  0.45  4.09   Sodium 135 - 145 mmol/L 138  140  134   Potassium 3.5 - 5.1 mmol/L 4.2  3.7  3.8   Chloride 98 - 111 mmol/L 105  104  100   CO2 22 - 32 mmol/L 29  30  29    Calcium 8.9 - 10.3 mg/dL 9.0  9.1  8.7   Total Protein 6.5 - 8.1 g/dL 6.2  6.3  6.1   Total Bilirubin 0.3 - 1.2 mg/dL 0.6  0.7  0.5   Alkaline Phos 38 - 126  U/L 73  76  83   AST 15 - 41 U/L 18  19  19    ALT 0 - 44 U/L 15  16  15      Lab Results  Component Value Date   WBC 6.4 07/23/2023   HGB 11.2 (L) 07/23/2023   HCT 34.3 (L) 07/23/2023   MCV 84.7 07/23/2023   PLT 276 07/23/2023   NEUTROABS 4.7 07/23/2023    ASSESSMENT & PLAN:  Malignant neoplasm of upper-outer quadrant of left breast in female, estrogen receptor negative (HCC) 04/07/2022:Left mastectomy: 4.2 cm invasive pleomorphic lobular carcinoma grade 2, LCIS, 5/5 lymph nodes positive with extranodal extension, margins negative,  ER 0%, PR 0%, HER2 3+, Ki-67 20%   CT CAP 04/26/2022: Lytic osseous lesions throughout body, right seventh rib, multiple thoracic vertebral bodies, sacrum.  T4 vertebral body Bone scan 04/25/2022: Abnormal uptake in the sternum, right parietal region of calvarium, right posterior seventh rib, T5, left sternoclavicular joint 07/30/2022: Bone scan: Interval resolution/decreasing accumulation of multiple bone metastases 10/30/2022: CT CAP: Mildly prominent mediastinal lymph nodes are similar, no pulmonary metastasis, skeletal lesions increasing sclerosis, no visceral metastases 01/16/2023: CT CAP: Stable prominent mediastinal lymph nodes, slight increase in the small nodes in the mesentery could be reactive, scattered bone metastases stable 05/16/2023: CT CAP: Stable bone metastases, unchanged pretracheal lymph nodes.  Previous mesenteric lymph nodes not conspicuous.  Diffuse colonic mucosal thickening: Colitis   Treatment: Palliative chemotherapy with Taxotere [name] and Perjeta every 3 weeks x1 cycle given 05/05/2022 (discontinued for severe toxicities and hospitalization)   ---------------------------------------------------------------------------------------------------------------------------------------------- Chemotoxicities: Hospitalization 05/11/2022-05/18/2022: Convulsive syncope, chemo induced neutropenia, oral mucositis, chronic CHF, severe diarrhea with  dehydration ECHO 06/26/22: EF 55-60% (follows with Bensimhon)   Treatment plan:05/26/22 [name] maintenance.  (Added Perjeta 06/21/22-discontinued 06/2023 secondary to colitis) CT CAP 05/16/2023: Bone metastases unchanged, unchanged pretracheal lymph nodes, colitis CT CAP 09/17/2023: Unchanged bone metastases.   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 Barbaraann Cao.  Another brain MRI coming up in December  Return to clinic every 3 months with labs and follow-up with ------------------------------------- Assessment and Plan    Metastatic Breast Cancer Stable sclerotic lesions in the sternum and S1 vertebrae. No new lesions identified. Patient reports intermittent back pain in the area of the S1 lesion, possibly due to muscle tension around the lesion. -Continue current treatment regimen. -Next treatment scheduled for 10/15/2023.  Weight Management Patient reports weight gain over the holiday period and struggles with maintaining a regular exercise routine. -Encouraged to resume regular exercise and healthy eating habits.     No orders of the defined types were placed in this encounter.  The patient has a good understanding of the overall plan. she agrees with it. she will call with any problems that may develop before the next visit here. Total time spent: 30 mins including face to face time and time spent for planning, charting and co-ordination of care   Tamsen Meek, MD 09/24/23

## 2023-09-24 NOTE — Assessment & Plan Note (Signed)
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  T4 vertebral body Bone scan 04/25/2022: Abnormal uptake in the sternum, right parietal region of calvarium, right posterior seventh rib, T5, left sternoclavicular joint 07/30/2022: Bone scan: Interval resolution/decreasing accumulation of multiple bone metastases 10/30/2022: CT CAP: Mildly prominent mediastinal lymph nodes are similar, no pulmonary metastasis, skeletal lesions increasing sclerosis, no visceral metastases 01/16/2023: CT CAP: Stable prominent mediastinal lymph nodes, slight increase in the small nodes in the mesentery could be reactive, scattered bone metastases stable 05/16/2023: CT CAP: Stable bone metastases, unchanged pretracheal lymph nodes.  Previous mesenteric lymph nodes not conspicuous.  Diffuse colonic mucosal thickening: Colitis   Treatment: Palliative chemotherapy with Taxotere [name] and Perjeta every 3 weeks x1 cycle given 05/05/2022 (discontinued for severe toxicities and hospitalization)   ---------------------------------------------------------------------------------------------------------------------------------------------- Chemotoxicities: Hospitalization 05/11/2022-05/18/2022: Convulsive syncope, chemo induced neutropenia, oral mucositis, chronic CHF, severe diarrhea with dehydration ECHO 06/26/22: EF 55-60% (follows with Bensimhon)   Treatment plan:05/26/22 [name] maintenance.  (Added Perjeta 06/21/22-discontinued 06/2023 secondary to colitis) CT CAP 05/16/2023: Bone metastases unchanged, unchanged pretracheal lymph nodes, colitis CT CAP 09/17/2023: Unchanged bone metastases.   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 Barbaraann Cao.  Another brain MRI coming up in December  Return to clinic every 3 months with labs and follow-up with

## 2023-09-24 NOTE — Patient Instructions (Signed)
 [facility] - A DEPT OF [facility]  Discharge Instructions: Thank you for choosing [facility] to provide your oncology and hematology care.   If you have a lab appointment with the Cancer Center, please go directly to the Cancer Center and check in at the registration area.   Wear comfortable clothing and clothing appropriate for easy access to any Portacath or PICC line.   We strive to give you quality time with your provider. You may need to reschedule your appointment if you arrive late (15 or more minutes).  Arriving late affects you and other patients whose appointments are after yours.  Also, if you miss three or more appointments without notifying the office, you may be dismissed from the clinic at the provider's discretion.      For prescription refill requests, have your pharmacy contact our office and allow 72 hours for refills to be completed.    Today you received the following chemotherapy and/or immunotherapy agents: Ogivri      To help prevent nausea and vomiting after your treatment, we encourage you to take your nausea medication as directed.  BELOW ARE SYMPTOMS THAT SHOULD BE REPORTED IMMEDIATELY: *FEVER GREATER THAN 100.4 F (38 C) OR HIGHER *CHILLS OR SWEATING *NAUSEA AND VOMITING THAT IS NOT CONTROLLED WITH YOUR NAUSEA MEDICATION *UNUSUAL SHORTNESS OF BREATH *UNUSUAL BRUISING OR BLEEDING *URINARY PROBLEMS (pain or burning when urinating, or frequent urination) *BOWEL PROBLEMS (unusual diarrhea, constipation, pain near the anus) TENDERNESS IN MOUTH AND THROAT WITH OR WITHOUT PRESENCE OF ULCERS (sore throat, sores in mouth, or a toothache) UNUSUAL RASH, SWELLING OR PAIN  UNUSUAL VAGINAL DISCHARGE OR ITCHING   Items with * indicate a potential emergency and should be followed up as soon as possible or go to the Emergency Department if any problems should occur.  Please show the CHEMOTHERAPY ALERT CARD or IMMUNOTHERAPY  ALERT CARD at check-in to the Emergency Department and triage nurse.  Should you have questions after your visit or need to cancel or reschedule your appointment, please contact [facility] - A DEPT OF [facility]  Dept: [phone]  and follow the prompts.  Office hours are 8:00 a.m. to 4:30 p.m. Monday - Friday. Please note that voicemails left after 4:00 p.m. may not be returned until the following business day.  We are closed weekends and major holidays. You have access to a nurse at all times for urgent questions. Please call the main number to the clinic Dept: [phone] and follow the prompts.   For any non-urgent questions, you may also contact your provider using MyChart. We now offer e-Visits for anyone 65 and older to request care online for non-urgent symptoms. For details visit [email or web address].   Also download the MyChart app! Go to the app store, search "MyChart", open the app, select [place], and log in with your MyChart username and password.

## 2023-10-01 ENCOUNTER — Ambulatory Visit (HOSPITAL_COMMUNITY)
Admission: RE | Admit: 2023-10-01 | Discharge: 2023-10-01 | Disposition: A | Discharge status: Home / Self Care | Payer: Medicare Other | Source: Ambulatory Visit | Attending: Cardiology | Admitting: Cardiology

## 2023-10-01 DIAGNOSIS — I5032 Chronic diastolic (congestive) heart failure: Secondary | ICD-10-CM | POA: Insufficient documentation

## 2023-10-01 DIAGNOSIS — C50212 Malignant neoplasm of upper-inner quadrant of left female breast: Secondary | ICD-10-CM

## 2023-10-01 LAB — BASIC METABOLIC PANEL
Anion gap: 8 (ref 5–15)
BUN: 10 mg/dL (ref 8–23)
CO2: 23 mmol/L (ref 22–32)
Calcium: 9.2 mg/dL (ref 8.9–10.3)
Chloride: 103 mmol/L (ref 98–111)
Creatinine, Ser: 1.1 mg/dL — ABNORMAL HIGH (ref 0.44–1.00)
GFR, Estimated: 51 mL/min — ABNORMAL LOW (ref >60)
Glucose, Bld: 112 mg/dL — ABNORMAL HIGH (ref 70–99)
Potassium: 4.2 mmol/L (ref 3.5–5.1)
Sodium: 134 mmol/L — ABNORMAL LOW (ref 135–145)

## 2023-10-05 ENCOUNTER — Other Ambulatory Visit: Payer: Self-pay

## 2023-10-15 ENCOUNTER — Inpatient Hospital Stay: Payer: Medicare Other | Attending: Hematology and Oncology

## 2023-10-15 VITALS — BP 152/64 | HR 70 | Temp 98.0°F | Resp 16 | Wt 151.5 lb

## 2023-10-15 DIAGNOSIS — Z801 Family history of malignant neoplasm of trachea, bronchus and lung: Secondary | ICD-10-CM | POA: Diagnosis not present

## 2023-10-15 DIAGNOSIS — Z5112 Encounter for antineoplastic immunotherapy: Secondary | ICD-10-CM | POA: Diagnosis not present

## 2023-10-15 DIAGNOSIS — Z803 Family history of malignant neoplasm of breast: Secondary | ICD-10-CM | POA: Insufficient documentation

## 2023-10-15 DIAGNOSIS — Z171 Estrogen receptor negative status [ER-]: Secondary | ICD-10-CM | POA: Insufficient documentation

## 2023-10-15 DIAGNOSIS — C50412 Malignant neoplasm of upper-outer quadrant of left female breast: Secondary | ICD-10-CM | POA: Insufficient documentation

## 2023-10-15 DIAGNOSIS — C7951 Secondary malignant neoplasm of bone: Secondary | ICD-10-CM | POA: Insufficient documentation

## 2023-10-15 DIAGNOSIS — D352 Benign neoplasm of pituitary gland: Secondary | ICD-10-CM | POA: Diagnosis not present

## 2023-10-15 DIAGNOSIS — Z8042 Family history of malignant neoplasm of prostate: Secondary | ICD-10-CM | POA: Insufficient documentation

## 2023-10-15 DIAGNOSIS — Z9012 Acquired absence of left breast and nipple: Secondary | ICD-10-CM | POA: Diagnosis not present

## 2023-10-15 DIAGNOSIS — Z79899 Other long term (current) drug therapy: Secondary | ICD-10-CM | POA: Diagnosis not present

## 2023-10-15 MED ORDER — SODIUM CHLORIDE 0.9% FLUSH
10.0000 mL | INTRAVENOUS | Status: DC | PRN
  Administered 2023-10-15: 10 mL

## 2023-10-15 MED ORDER — TRASTUZUMAB-DKST CHEMO 150 MG IV SOLR
6.0000 mg/kg | Freq: Once | INTRAVENOUS | Status: AC
  Administered 2023-10-15: 420 mg via INTRAVENOUS
  Filled 2023-10-15: qty 20

## 2023-10-15 MED ORDER — ACETAMINOPHEN 325 MG PO TABS
650.0000 mg | ORAL_TABLET | Freq: Once | ORAL | Status: AC
  Administered 2023-10-15: 650 mg via ORAL
  Filled 2023-10-15: qty 2

## 2023-10-15 MED ORDER — DIPHENHYDRAMINE HCL 25 MG PO CAPS
25.0000 mg | ORAL_CAPSULE | Freq: Once | ORAL | Status: AC
  Administered 2023-10-15: 25 mg via ORAL
  Filled 2023-10-15: qty 1

## 2023-10-15 MED ORDER — SODIUM CHLORIDE 0.9 % IV SOLN: Freq: Once | INTRAVENOUS | Status: AC

## 2023-10-15 MED ORDER — HEPARIN SOD (PORK) LOCK FLUSH 100 UNIT/ML IV SOLN
500.0000 [IU] | Freq: Once | INTRAVENOUS | Status: AC | PRN
  Administered 2023-10-15: 500 [IU]

## 2023-10-15 NOTE — Patient Instructions (Signed)
 [facility] - A DEPT OF [facility]  Discharge Instructions: Thank you for choosing [facility] to provide your oncology and hematology care.   If you have a lab appointment with the Cancer Center, please go directly to the Cancer Center and check in at the registration area.   Wear comfortable clothing and clothing appropriate for easy access to any Portacath or PICC line.   We strive to give you quality time with your provider. You may need to reschedule your appointment if you arrive late (15 or more minutes).  Arriving late affects you and other patients whose appointments are after yours.  Also, if you miss three or more appointments without notifying the office, you may be dismissed from the clinic at the provider's discretion.      For prescription refill requests, have your pharmacy contact our office and allow 72 hours for refills to be completed.    Today you received the following chemotherapy and/or immunotherapy agents: Ogivri      To help prevent nausea and vomiting after your treatment, we encourage you to take your nausea medication as directed.  BELOW ARE SYMPTOMS THAT SHOULD BE REPORTED IMMEDIATELY: *FEVER GREATER THAN 100.4 F (38 C) OR HIGHER *CHILLS OR SWEATING *NAUSEA AND VOMITING THAT IS NOT CONTROLLED WITH YOUR NAUSEA MEDICATION *UNUSUAL SHORTNESS OF BREATH *UNUSUAL BRUISING OR BLEEDING *URINARY PROBLEMS (pain or burning when urinating, or frequent urination) *BOWEL PROBLEMS (unusual diarrhea, constipation, pain near the anus) TENDERNESS IN MOUTH AND THROAT WITH OR WITHOUT PRESENCE OF ULCERS (sore throat, sores in mouth, or a toothache) UNUSUAL RASH, SWELLING OR PAIN  UNUSUAL VAGINAL DISCHARGE OR ITCHING   Items with * indicate a potential emergency and should be followed up as soon as possible or go to the Emergency Department if any problems should occur.  Please show the CHEMOTHERAPY ALERT CARD or IMMUNOTHERAPY  ALERT CARD at check-in to the Emergency Department and triage nurse.  Should you have questions after your visit or need to cancel or reschedule your appointment, please contact [facility] - A DEPT OF [facility]  Dept: [phone]  and follow the prompts.  Office hours are 8:00 a.m. to 4:30 p.m. Monday - Friday. Please note that voicemails left after 4:00 p.m. may not be returned until the following business day.  We are closed weekends and major holidays. You have access to a nurse at all times for urgent questions. Please call the main number to the clinic Dept: [phone] and follow the prompts.   For any non-urgent questions, you may also contact your provider using MyChart. We now offer e-Visits for anyone 65 and older to request care online for non-urgent symptoms. For details visit [email or web address].   Also download the MyChart app! Go to the app store, search "MyChart", open the app, select [place], and log in with your MyChart username and password.

## 2023-10-24 ENCOUNTER — Ambulatory Visit: Payer: Medicare Other | Admitting: Plastic Surgery

## 2023-11-05 ENCOUNTER — Inpatient Hospital Stay: Payer: Medicare Other

## 2023-11-05 ENCOUNTER — Inpatient Hospital Stay: Payer: Medicare Other | Attending: Hematology and Oncology | Admitting: Hematology and Oncology

## 2023-11-05 VITALS — BP 130/53 | HR 79 | Temp 98.7°F | Resp 19 | Ht 66.0 in | Wt 155.5 lb

## 2023-11-05 VITALS — BP 135/65 | HR 66 | Resp 18

## 2023-11-05 DIAGNOSIS — C7951 Secondary malignant neoplasm of bone: Secondary | ICD-10-CM | POA: Diagnosis not present

## 2023-11-05 DIAGNOSIS — D352 Benign neoplasm of pituitary gland: Secondary | ICD-10-CM | POA: Insufficient documentation

## 2023-11-05 DIAGNOSIS — Z5112 Encounter for antineoplastic immunotherapy: Secondary | ICD-10-CM | POA: Insufficient documentation

## 2023-11-05 DIAGNOSIS — Z803 Family history of malignant neoplasm of breast: Secondary | ICD-10-CM | POA: Insufficient documentation

## 2023-11-05 DIAGNOSIS — Z9012 Acquired absence of left breast and nipple: Secondary | ICD-10-CM | POA: Insufficient documentation

## 2023-11-05 DIAGNOSIS — Z171 Estrogen receptor negative status [ER-]: Secondary | ICD-10-CM | POA: Insufficient documentation

## 2023-11-05 DIAGNOSIS — Z79899 Other long term (current) drug therapy: Secondary | ICD-10-CM | POA: Diagnosis not present

## 2023-11-05 DIAGNOSIS — C50412 Malignant neoplasm of upper-outer quadrant of left female breast: Secondary | ICD-10-CM | POA: Diagnosis not present

## 2023-11-05 DIAGNOSIS — Z8042 Family history of malignant neoplasm of prostate: Secondary | ICD-10-CM | POA: Diagnosis not present

## 2023-11-05 MED ORDER — DIPHENHYDRAMINE HCL 25 MG PO CAPS
25.0000 mg | ORAL_CAPSULE | Freq: Once | ORAL | Status: AC
  Administered 2023-11-05: 25 mg via ORAL
  Filled 2023-11-05: qty 1

## 2023-11-05 MED ORDER — SODIUM CHLORIDE 0.9% FLUSH
10.0000 mL | INTRAVENOUS | Status: DC | PRN
  Administered 2023-11-05: 10 mL

## 2023-11-05 MED ORDER — ACETAMINOPHEN 325 MG PO TABS
650.0000 mg | ORAL_TABLET | Freq: Once | ORAL | Status: AC
  Administered 2023-11-05: 650 mg via ORAL
  Filled 2023-11-05: qty 2

## 2023-11-05 MED ORDER — SODIUM CHLORIDE 0.9 % IV SOLN: Freq: Once | INTRAVENOUS | Status: AC

## 2023-11-05 MED ORDER — HEPARIN SOD (PORK) LOCK FLUSH 100 UNIT/ML IV SOLN
500.0000 [IU] | Freq: Once | INTRAVENOUS | Status: AC | PRN
  Administered 2023-11-05: 500 [IU]

## 2023-11-05 MED ORDER — TRASTUZUMAB-DKST CHEMO 150 MG IV SOLR
6.0000 mg/kg | Freq: Once | INTRAVENOUS | Status: AC
  Administered 2023-11-05: 420 mg via INTRAVENOUS
  Filled 2023-11-05: qty 20

## 2023-11-05 NOTE — Progress Notes (Signed)
Patient Care Team: Lupita Raider, MD as PCP - General (Family Medicine) Van Clines, MD as Consulting Physician (Neurology) Pershing Proud, RN as Oncology Nurse Navigator Donnelly Angelica, RN as Oncology Nurse Navigator Abigail Miyamoto, MD as Consulting Physician (General Surgery) Serena Croissant, MD as Consulting Physician (Hematology and Oncology) Lonie Peak, MD as Attending Physician (Radiation Oncology)  DIAGNOSIS:  Encounter Diagnosis  Name Primary?   Malignant neoplasm of upper-outer quadrant of left breast in female, estrogen receptor negative (HCC) Yes    SUMMARY OF ONCOLOGIC HISTORY: Oncology History  Malignant neoplasm of upper-outer quadrant of left breast in female, estrogen receptor negative (HCC)  02/10/2022 Initial Diagnosis   Palpable left breast masses and calcifications: 1.8 cm at 1:00 and 1.7 cm at 9:00, calcifications not biopsy.  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 Report date is 03/05/2022.  The CustomNext-Cancer+RNAinsight panel offered by [facility] includes sequencing and rearrangement analysis for the following 48 genes:  APC, ATM, AXIN2, BARD1, BMPR1A, BRCA1, BRCA2, BRIP1, CDH1, CDK4, CDKN2A, CHEK2, CTNNA1, DICER1, EGFR, EPCAM, GREM1, HOXB13, KIT, MEN1, MLH1, MSH2, MSH3, MSH6, MUTYH, NBN, NF1, NTHL1, PALB2, PDGFRA, PMS2, POLD1, POLE, PTEN, RAD50, RAD51C, RAD51D, SDHA, SDHB, SDHC, SDHD, SMAD4, SMARCA4, STK11, TP53, TSC1, TSC2, and VHL.  RNA data is routinely analyzed for use in variant interpretation for all genes.   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, LCIS, 5/5 lymph nodes positive with extranodal extension, margins negative ER 0%, PR 0%, HER2 3+, Ki-67 20%   04/27/2022 Cancer Staging   Staging form: Breast, AJCC 8th Edition - Pathologic: Stage IIIA (pT2, pN2, cM0, G2, ER-, PR-, HER2+) - Signed by Serena Croissant, MD on 04/27/2022 Histologic grading system: 3 grade system   05/05/2022 - 05/26/2022 Chemotherapy   Patient is on Treatment Plan : BREAST DOCEtaxel + Trastuzumab + Pertuzumab (THP) q21d x 8 cycles / Trastuzumab + Pertuzumab q21d x 4 cycles     05/26/2022 -  Chemotherapy   Patient is on Treatment Plan : BREAST MAINTENANCE Trastuzumab IV (6) or SQ (600) D1 q21d X 11 Cycles     06/12/2023 Cancer Staging   Staging form: Breast, AJCC 8th Edition - Pathologic: Stage IV (cM1) - Signed by [name], NP on 06/12/2023     CHIEF COMPLIANT: [name] maintenance for metastatic breast cancer  HISTORY OF PRESENT ILLNESS:   History of Present Illness   The patient, with breast cancer and bone metastasis, presents for a follow-up visit regarding her ongoing treatment and management. She is accompanied by her sister-in-law.  Recent imaging, including a CT and brain scan, shows stable findings with no new lesions. She inquires about the shadows on her bones and is informed that these are stable. She is currently receiving [name] as part of her treatment regimen for breast cancer with bone metastasis.  She expresses concern about her bone health, reporting two falls without injury, indicating her bones are currently strong.  She experiences a persistent runny nose, which she attributes to the dry air in her home. She is considering using  a cold mist humidifier to alleviate this symptom.  She mentions experiencing constipation and has been using a tea recommended by her sister-in-law to help with bowel movements. She notes that she has not been experiencing any  abdominal pain and uses a medication as needed if she does not have a bowel movement for three to four days.  She has a history of dental issues, including receding gums and multiple caps, but no recent extractions.         ALLERGIES:  is allergic to oxybutynin, prozac [fluoxetine hcl], ritalin [methylphenidate], rosuvastatin, and lyrica [pregabalin].  MEDICATIONS:  Current Outpatient Medications  Medication Sig Dispense Refill   B Complex-C (B COMPLEX-VITAMIN C) CAPS Take 1 capsule by mouth daily.     cholecalciferol (VITAMIN D3) 25 MCG (1000 UNIT) tablet Take 1,000 Units by mouth daily.     clonazePAM (KLONOPIN) 1 MG tablet Take 1 mg by mouth at bedtime.     Cyanocobalamin (VITAMIN B-12) 5000 MCG TBDP Take 5,000 Units by mouth daily.     diphenhydrAMINE (BENADRYL) 25 MG tablet Take 25 mg by mouth in the morning.     fluticasone (FLONASE) 50 MCG/ACT nasal spray Place 1 spray into both nostrils daily as needed for allergies.     gabapentin (NEURONTIN) 100 MG capsule Take 1 capsule (100 mg total) by mouth at bedtime. 14 capsule 0   lidocaine-prilocaine (EMLA) cream Apply 1 Application topically as needed. Apply one application daily as needed to port a cath site. 30 g 3   losartan (COZAAR) 100 MG tablet Take 1 tablet (100 mg total) by mouth daily. 90 tablet 3   pantoprazole (PROTONIX) 40 MG tablet Take 1 tablet (40 mg total) by mouth 2 (two) times daily. 90 tablet 3   spironolactone (ALDACTONE) 25 MG tablet Take 0.5 tablets (12.5 mg total) by mouth daily. 15 tablet 6   traZODone (DESYREL) 150 MG tablet Take 225 mg by mouth at bedtime.     venlafaxine XR (EFFEXOR-XR) 75 MG 24 hr capsule Take 75 mg by mouth daily with breakfast.     No current facility-administered medications for this visit.    PHYSICAL EXAMINATION: ECOG PERFORMANCE STATUS: 1 - Symptomatic but completely ambulatory  Vitals:   11/05/23 1024  BP: (!) 130/53  Pulse: 79  Resp: 19  Temp: 98.7 F (37.1 C)  SpO2: 100%    Filed Weights   11/05/23 1024  Weight: 155 lb 8 oz (70.5 kg)    Physical Exam          (exam performed in the presence of a chaperone)  LABORATORY DATA:  I have reviewed the data as listed    Latest Ref Rng & Units 10/01/2023    2:30 PM 07/23/2023   11:03 AM 06/25/2023   10:20 AM  CMP  Glucose 70 - 99 mg/dL 409  99  93   BUN 8 - 23 mg/dL 10  10  8    Creatinine 0.44 - 1.00 mg/dL 8.11  9.14  7.82   Sodium 135 - 145 mmol/L 134  138  140   Potassium 3.5 - 5.1 mmol/L 4.2  4.2  3.7   Chloride 98 - 111 mmol/L 103  105  104   CO2 22 - 32 mmol/L 23  29  30    Calcium 8.9 - 10.3 mg/dL 9.2  9.0  9.1   Total Protein 6.5 - 8.1 g/dL  6.2  6.3   Total Bilirubin 0.3 - 1.2 mg/dL  0.6  0.7  Alkaline Phos 38 - 126 U/L  73  76   AST 15 - 41 U/L  18  19   ALT 0 - 44 U/L  15  16     Lab Results  Component Value Date   WBC 6.4 07/23/2023   HGB 11.2 (L) 07/23/2023   HCT 34.3 (L) 07/23/2023   MCV 84.7 07/23/2023   PLT 276 07/23/2023   NEUTROABS 4.7 07/23/2023    ASSESSMENT & PLAN:  Malignant neoplasm of upper-outer quadrant of left breast in female, estrogen receptor negative (HCC) 04/07/2022:Left mastectomy: 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  T4 vertebral body Bone scan 04/25/2022: Abnormal uptake in the sternum, right parietal region of calvarium, right posterior seventh rib, T5, left sternoclavicular joint 07/30/2022: Bone scan: Interval resolution/decreasing accumulation of multiple bone metastases 10/30/2022: CT CAP: Mildly prominent mediastinal lymph nodes are similar, no pulmonary metastasis, skeletal lesions increasing sclerosis, no visceral metastases 01/16/2023: CT CAP: Stable prominent mediastinal lymph nodes, slight increase in the  small nodes in the mesentery could be reactive, scattered bone metastases stable 05/16/2023: CT CAP: Stable bone metastases, unchanged pretracheal lymph nodes.  Previous mesenteric lymph nodes not conspicuous.  Diffuse colonic mucosal thickening: Colitis   Treatment: Palliative chemotherapy with Taxotere [name] and Perjeta every 3 weeks x1 cycle given 05/05/2022 (discontinued for severe toxicities and hospitalization)   ---------------------------------------------------------------------------------------------------------------------------------------------- Chemotoxicities: Hospitalization 05/11/2022-05/18/2022: Convulsive syncope, chemo induced neutropenia, oral mucositis, chronic CHF, severe diarrhea with dehydration ECHO 06/26/22: EF 55-60% (follows with Bensimhon)   Treatment plan:05/26/22 [name] maintenance.  (Added Perjeta 06/21/22-discontinued 06/2023 secondary to colitis) CT CAP 05/16/2023: Bone metastases unchanged, unchanged pretracheal lymph nodes, colitis CT CAP 09/17/2023: Unchanged bone metastases.   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 Barbaraann Cao.  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 We will change [name] to every 4 weeks. MD visits with every other treatment.  No orders of the defined types were placed in this encounter.  The patient has a good understanding of the overall plan. she agrees with it. she will call with any problems that may develop before the next visit here. Total time spent: 30 mins including face to face time and time spent for planning, charting and co-ordination of care   Tamsen Meek, MD 11/05/23

## 2023-11-05 NOTE — Assessment & Plan Note (Signed)
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  T4 vertebral body Bone scan 04/25/2022: Abnormal uptake in the sternum, right parietal region of calvarium, right posterior seventh rib, T5, left sternoclavicular joint 07/30/2022: Bone scan: Interval resolution/decreasing accumulation of multiple bone metastases 10/30/2022: CT CAP: Mildly prominent mediastinal lymph nodes are similar, no pulmonary metastasis, skeletal lesions increasing sclerosis, no visceral metastases 01/16/2023: CT CAP: Stable prominent mediastinal lymph nodes, slight increase in the small nodes in the mesentery could be reactive, scattered bone metastases stable 05/16/2023: CT CAP: Stable bone metastases, unchanged pretracheal lymph nodes.  Previous mesenteric lymph nodes not conspicuous.  Diffuse colonic mucosal thickening: Colitis   Treatment: Palliative chemotherapy with Taxotere [name] and Perjeta every 3 weeks x1 cycle given 05/05/2022 (discontinued for severe toxicities and hospitalization)   ---------------------------------------------------------------------------------------------------------------------------------------------- Chemotoxicities: Hospitalization 05/11/2022-05/18/2022: Convulsive syncope, chemo induced neutropenia, oral mucositis, chronic CHF, severe diarrhea with dehydration ECHO 06/26/22: EF 55-60% (follows with Bensimhon)   Treatment plan:05/26/22 [name] maintenance.  (Added Perjeta 06/21/22-discontinued 06/2023 secondary to colitis) CT CAP 05/16/2023: Bone metastases unchanged, unchanged pretracheal lymph nodes, colitis CT CAP 09/17/2023: Unchanged bone metastases.   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 Barbaraann Cao.  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

## 2023-11-05 NOTE — Patient Instructions (Signed)
 [facility] - A DEPT OF [facility]  Discharge Instructions: Thank you for choosing [facility] to provide your oncology and hematology care.   If you have a lab appointment with the Cancer Center, please go directly to the Cancer Center and check in at the registration area.   Wear comfortable clothing and clothing appropriate for easy access to any Portacath or PICC line.   We strive to give you quality time with your provider. You may need to reschedule your appointment if you arrive late (15 or more minutes).  Arriving late affects you and other patients whose appointments are after yours.  Also, if you miss three or more appointments without notifying the office, you may be dismissed from the clinic at the provider's discretion.      For prescription refill requests, have your pharmacy contact our office and allow 72 hours for refills to be completed.    Today you received the following chemotherapy and/or immunotherapy agents: Ogivri      To help prevent nausea and vomiting after your treatment, we encourage you to take your nausea medication as directed.  BELOW ARE SYMPTOMS THAT SHOULD BE REPORTED IMMEDIATELY: *FEVER GREATER THAN 100.4 F (38 C) OR HIGHER *CHILLS OR SWEATING *NAUSEA AND VOMITING THAT IS NOT CONTROLLED WITH YOUR NAUSEA MEDICATION *UNUSUAL SHORTNESS OF BREATH *UNUSUAL BRUISING OR BLEEDING *URINARY PROBLEMS (pain or burning when urinating, or frequent urination) *BOWEL PROBLEMS (unusual diarrhea, constipation, pain near the anus) TENDERNESS IN MOUTH AND THROAT WITH OR WITHOUT PRESENCE OF ULCERS (sore throat, sores in mouth, or a toothache) UNUSUAL RASH, SWELLING OR PAIN  UNUSUAL VAGINAL DISCHARGE OR ITCHING   Items with * indicate a potential emergency and should be followed up as soon as possible or go to the Emergency Department if any problems should occur.  Please show the CHEMOTHERAPY ALERT CARD or IMMUNOTHERAPY  ALERT CARD at check-in to the Emergency Department and triage nurse.  Should you have questions after your visit or need to cancel or reschedule your appointment, please contact [facility] - A DEPT OF [facility]  Dept: [phone]  and follow the prompts.  Office hours are 8:00 a.m. to 4:30 p.m. Monday - Friday. Please note that voicemails left after 4:00 p.m. may not be returned until the following business day.  We are closed weekends and major holidays. You have access to a nurse at all times for urgent questions. Please call the main number to the clinic Dept: [phone] and follow the prompts.   For any non-urgent questions, you may also contact your provider using MyChart. We now offer e-Visits for anyone 65 and older to request care online for non-urgent symptoms. For details visit [email or web address].   Also download the MyChart app! Go to the app store, search "MyChart", open the app, select [place], and log in with your MyChart username and password.

## 2023-11-20 ENCOUNTER — Encounter: Payer: Self-pay | Admitting: Hematology and Oncology

## 2023-11-20 ENCOUNTER — Ambulatory Visit
Admission: EM | Admit: 2023-11-20 | Discharge: 2023-11-20 | Disposition: A | Discharge status: Home / Self Care | Payer: Medicare Other | Attending: Family Medicine | Admitting: Family Medicine

## 2023-11-20 DIAGNOSIS — I427 Cardiomyopathy due to drug and external agent: Secondary | ICD-10-CM | POA: Insufficient documentation

## 2023-11-20 DIAGNOSIS — U071 COVID-19: Secondary | ICD-10-CM

## 2023-11-20 DIAGNOSIS — R062 Wheezing: Secondary | ICD-10-CM | POA: Diagnosis not present

## 2023-11-20 LAB — POC COVID19/FLU A&B COMBO
Covid Antigen, POC: POSITIVE — AB
Influenza A Antigen, POC: NEGATIVE
Influenza B Antigen, POC: NEGATIVE

## 2023-11-20 MED ORDER — PROMETHAZINE-DM 6.25-15 MG/5ML PO SYRP
5.0000 mL | ORAL_SOLUTION | Freq: Four times a day (QID) | ORAL | 0 refills | Status: DC | PRN
Start: 2023-11-20 — End: 2023-11-29

## 2023-11-20 MED ORDER — PREDNISONE 10 MG PO TABS: 10.0000 mg | ORAL_TABLET | Freq: Two times a day (BID) | ORAL | 0 refills | Status: DC

## 2023-11-20 MED ORDER — PAXLOVID (150/100) 10 X 150 MG & 10 X 100MG PO TBPK: 2.0000 | ORAL_TABLET | Freq: Two times a day (BID) | ORAL | 0 refills | Status: DC

## 2023-11-20 NOTE — ED Triage Notes (Signed)
Pt presents with URI symptoms that onset Sunday. Pt denies vomiting or fever but says her whole body aches and she just doesn't feel good.

## 2023-11-20 NOTE — ED Provider Notes (Signed)
[facility]    CSN: [id number] Arrival date & time: 11/20/23  1356      History   Chief Complaint Chief Complaint  Patient presents with   Cough   Nasal Congestion    HPI Allison Pierce is a 78 y.o. female.  Patient here with 2 days of generalized bodyaches, cough, significant fatigue, congestion and headache.  Patient reports her husband was sick about 2 weeks ago and he is just getting over his illness.  She reports her husband did not get tested for anything but she fears that she may have gotten sick from him.  She is taking some over the counter medication without any improvement of symptoms.  Denies any history of chronic lower respiratory disease however history of breast cancer left breast. Past Medical History:  Diagnosis Date   Anxiety    C. difficile diarrhea    Depression    GERD (gastroesophageal reflux disease) OCCASIONALLY  TAKE TUMS   Human papilloma virus    Hyperlipidemia    Hypertension    left breast ca 12/2021   UC (ulcerative colitis) (HCC)    UTI (urinary tract infection)    Vulvar lesion     Patient Active Problem List   Diagnosis Date Noted   Cardiotoxicity (HCC) 11/20/2023   Pituitary mass (HCC) 09/11/2023   Metastasis to bone (HCC) 06/12/2023   Shingles 05/15/2022   Colitis 05/13/2022   Alcohol abuse 05/12/2022   Gastroesophageal reflux disease 05/12/2022   Irritable bowel syndrome 05/12/2022   Mild cognitive impairment 05/12/2022   Urinary incontinence 05/12/2022   Allergic rhinitis 05/12/2022   Recurrent major depression in remission (HCC) 05/12/2022   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HCC) 05/12/2022   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   Acute colitis 05/12/2022   Lactic acidosis 05/12/2022   Chemotherapy induced neutropenia (HCC) 05/12/2022   Chronic diastolic CHF (congestive heart failure) (HCC) 05/12/2022   Oral mucositis 05/12/2022   Noninfective gastroenteritis and colitis, unspecified 05/12/2022    Port-A-Cath in place 05/05/2022   Genetic testing 03/06/2022   Family history of breast cancer 02/23/2022   Family history of prostate cancer 02/23/2022   Essential hypertension 02/22/2022   Malignant neoplasm of upper-outer quadrant of left breast in female, estrogen receptor negative (HCC) 02/20/2022   Malignant neoplasm of upper-inner quadrant of left female breast (HCC) 02/20/2022   Right-sided epistaxis 07/19/2017   Anxiety state 01/24/2013   Arthropathy 01/24/2013   Depressive disorder, not elsewhere classified 01/24/2013   Mixed hyperlipidemia 01/24/2013   Hyperlipidemia, unspecified 01/24/2013    Past Surgical History:  Procedure Laterality Date   ABDOMINAL HYSTERECTOMY  1992   W/ SALPINGO-OOPHORECTOMY   ANTERIOR CERVICAL DECOMP/DISCECTOMY FUSION  01-18-2009  DR POOLE   C4  - C6   AUGMENTATION MAMMAPLASTY Bilateral    BREAST BIOPSY Right    BREAST BIOPSY Left 02/10/2022   x2   BREAST ENHANCEMENT SURGERY  2011   BREAST EXCISIONAL BIOPSY Left    BREAST RECONSTRUCTION WITH PLACEMENT OF TISSUE EXPANDER AND FLEX HD (ACELLULAR HYDRATED DERMIS) Left 04/07/2022   Procedure: BREAST RECONSTRUCTION WITH PLACEMENT OF TISSUE EXPANDER AND FLEX HD (ACELLULAR HYDRATED DERMIS);  Surgeon: Allena Napoleon, MD;  Location: [facility] OR;  Service: Plastics;  Laterality: Left;   BREAST SURGERY  02-04-2011  dr Jamey Ripa   EXCISION LEFT BREAST MASS--  CALCIFICATION   EYE SURGERY Bilateral    cataract removal   MASTECTOMY W/ SENTINEL NODE BIOPSY Left 04/07/2022   Procedure: LEFT MASTECTOMY WITH SENTINEL NODE BIOPSY;  Surgeon: Abigail Miyamoto, MD;  Location: [facility] OR;  Service: General;  Laterality: Left;  LMA   PORTACATH PLACEMENT N/A 04/07/2022   Procedure: PORT-A-CATH INSERTION WITH ULTRASOUND GUIDANCE;  Surgeon: Abigail Miyamoto, MD;  Location: [facility] OR;  Service: General;  Laterality: N/A;   REMOVAL OF TISSUE EXPANDER AND PLACEMENT OF IMPLANT Left 03/16/2023   Procedure: Removal of left breast tissue expander  and placement of permanent implant;  Surgeon: [name], MD;  Location: [facility];  Service: Plastics;  Laterality: Left;   VULVAR LESION REMOVAL  09/10/2012   Procedure: VULVAR LESION;  Surgeon: Gretta Cool, MD;  Location: [facility];  Service: Gynecology;  Laterality: N/A;  WIDE EXCISION OF VULVAR LESION    OB History   No obstetric history on file.      Home Medications    Prior to Admission medications   Medication Sig Start Date End Date Taking? Authorizing Provider  meloxicam (MOBIC) 15 MG tablet Take 15 mg by mouth daily. 11/15/23  Yes [provider]  nirmatrelvir/ritonavir, renal dosing, (PAXLOVID, 150/100,) 10 x 150 MG & 10 x 100MG  TBPK Take 2 tablets by mouth 2 (two) times daily for 5 days. Dosage for moderate renal impairment (eGFR >/= 30 to <60 mL/min): 150 mg nirmatrelvir (one 150 mg tablet) with 100 mg ritonavir (one 100 mg tablet), with both tablets taken together twice daily for 5 days. Not recommended if eGFR < 30 mL/min. PAXLOVID is not recommend in patients with severe hepatic impairment (Child-Pugh Class C). 11/20/23 11/25/23 Yes Bing Neighbors, NP  predniSONE (DELTASONE) 10 MG tablet Take 1 tablet (10 mg total) by mouth 2 (two) times daily with a meal for 5 days. 11/20/23 11/25/23 Yes Bing Neighbors, NP  promethazine-dextromethorphan (PROMETHAZINE-DM) 6.25-15 MG/5ML syrup Take 5 mLs by mouth 4 (four) times daily as needed for cough. 11/20/23  Yes Bing Neighbors, NP  valACYclovir (VALTREX) 1000 MG tablet 1 tablet Orally 3 times a day As needed 05/18/22  Yes [provider]  B Complex-C (B COMPLEX-VITAMIN C) CAPS Take 1 capsule by mouth daily.    [provider]  buPROPion (WELLBUTRIN XL) 300 MG 24 hr tablet 1 tablet in the morning Orally Once a day    [provider]  cholecalciferol (VITAMIN D3) 25 MCG (1000 UNIT) tablet Take 1,000 Units by mouth daily.    [provider]   clonazePAM (KLONOPIN) 1 MG tablet Take 1 mg by mouth at bedtime.    [provider]  CLONAZEPAM PO Take by mouth.    [provider]  Cyanocobalamin (VITAMIN B-12) 5000 MCG TBDP Take 5,000 Units by mouth daily.    [provider]  diphenhydrAMINE (BENADRYL) 25 MG tablet Take 25 mg by mouth in the morning.    [provider]  fluticasone (FLONASE) 50 MCG/ACT nasal spray Place 1 spray into both nostrils daily as needed for allergies.    [provider]  gabapentin (NEURONTIN) 100 MG capsule Take 1 capsule (100 mg total) by mouth at bedtime. 05/10/23   Namon Cirri E, PA-C  lidocaine-prilocaine (EMLA) cream Apply 1 Application topically as needed. Apply one application daily as needed to port a cath site. 12/12/22   Serena Croissant, MD  losartan (COZAAR) 100 MG tablet Take 1 tablet (100 mg total) by mouth daily. 02/16/23   Bensimhon, Bevelyn Buckles, MD  pantoprazole (PROTONIX) 40 MG tablet Take 1 tablet (40 mg total) by mouth 2 (two) times daily. 08/03/23  Cirigliano, Vito V, DO  simvastatin (ZOCOR) 40 MG tablet Take by mouth.    [provider]  spironolactone (ALDACTONE) 25 MG tablet Take 0.5 tablets (12.5 mg total) by mouth daily. 09/17/23   Bensimhon, Bevelyn Buckles, MD  traZODone (DESYREL) 150 MG tablet Take 225 mg by mouth at bedtime.    [provider]  venlafaxine XR (EFFEXOR-XR) 75 MG 24 hr capsule Take 75 mg by mouth daily with breakfast.    [provider]  prochlorperazine (COMPAZINE) 10 MG tablet Take 1 tablet (10 mg total) by mouth every 6 (six) hours as needed (Nausea or vomiting). Patient not taking: Reported on 05/12/2022 04/27/22 06/07/22  Serena Croissant, MD    Family History Family History  Problem Relation Age of Onset   Lung cancer Mother 31       she smoked   Prostate cancer Brother 70   Breast cancer Cousin        paternal first cousin   Breast cancer Cousin        paternal first cousin   Liver disease Neg Hx     Esophageal cancer Neg Hx    Colon cancer Neg Hx     Social History Social History   Tobacco Use   Smoking status: Never   Smokeless tobacco: Never  Vaping Use   Vaping status: Never Used  Substance Use Topics   Alcohol use: Yes    Alcohol/week: 7.0 standard drinks of alcohol    Types: 7 Cans of beer per week   Drug use: Never     Allergies   Oxybutynin, Prozac [fluoxetine hcl], Ritalin [methylphenidate], Rosuvastatin, and Lyrica [pregabalin]   Review of Systems Review of Systems  Respiratory:  Positive for cough.      Physical Exam Triage Vital Signs ED Triage Vitals  Encounter Vitals Group     BP 11/20/23 1605 (!) 141/89     Systolic BP Percentile --      Diastolic BP Percentile --      Pulse Rate 11/20/23 1605 85     Resp 11/20/23 1605 16     Temp 11/20/23 1605 98.3 F (36.8 C)     Temp Source 11/20/23 1605 Oral     SpO2 11/20/23 1605 97 %     Weight --      Height --      Head Circumference --      Peak Flow --      Pain Score 11/20/23 1603 8     Pain Loc --      Pain Education --      Exclude from Growth Chart --    No data found.  Updated Vital Signs BP (!) 141/89 (BP Location: Left Arm)   Pulse 85   Temp 98.3 F (36.8 C) (Oral)   Resp 16   SpO2 97%   Visual Acuity Right Eye Distance:   Left Eye Distance:   Bilateral Distance:    Right Eye Near:   Left Eye Near:    Bilateral Near:     Physical Exam Constitutional:      Appearance: She is well-developed. She is ill-appearing.  HENT:     Head: Normocephalic and atraumatic.     Nose: Congestion and rhinorrhea present.     Mouth/Throat:     Pharynx: No oropharyngeal exudate or posterior oropharyngeal erythema.  Eyes:     Conjunctiva/sclera: Conjunctivae normal.     Pupils: Pupils are equal, round, and reactive to light.  Neck:  Thyroid: No thyromegaly.     Trachea: No tracheal deviation.  Cardiovascular:     Rate and Rhythm: Normal rate and regular rhythm.     Heart sounds:  Normal heart sounds.  Pulmonary:     Effort: Pulmonary effort is normal.     Breath sounds: Wheezing and rhonchi present.  Abdominal:     General: Bowel sounds are normal. There is no distension.     Palpations: Abdomen is soft.     Tenderness: There is no abdominal tenderness.  Musculoskeletal:        General: Normal range of motion.     Cervical back: Normal range of motion and neck supple.  Lymphadenopathy:     Cervical: Cervical adenopathy present.  Skin:    General: Skin is warm and dry.  Neurological:     General: No focal deficit present.     Mental Status: She is alert.  Psychiatric:        Behavior: Behavior normal.        Thought Content: Thought content normal.        Judgment: Judgment normal.      UC Treatments / Results  Labs (all labs ordered are listed, but only abnormal results are displayed) Labs Reviewed  POC COVID19/FLU A&B COMBO - Abnormal; Notable for the following components:      Result Value   Covid Antigen, POC Positive (*)    All other components within normal limits    EKG   Radiology No results found.  Procedures Procedures (including critical care time)  Medications Ordered in UC Medications - No data to display  Initial Impression / Assessment and Plan / UC Course  I have reviewed the triage vital signs and the nursing notes.  Pertinent labs & imaging results that were available during my care of the patient were reviewed by me and considered in my medical decision making (see chart for details).    Care viral testing positive for COVID-19.  Given patient's age and comorbidities and symptoms less than 5 days, patient is prescribed Paxlovid regular dose. GFR is 51.  Given active wheezing prescribed prednisone 10 mg twice daily for 5 days.  Promethazine DM for cough.  Return precautions given if symptoms do not worsen but do not improve within 7 to 10 days.  Precautions given if any red flag symptoms develop.  Final Clinical  Impressions(s) / UC Diagnoses   Final diagnoses:  COVID-19  Wheezing     Discharge Instructions      Symptoms will gradually improve.  If any of your symptoms worsen at any point or you develop significant difficulty breathing go immediately to the emergency department.  If your symptoms improve but do not typically improve return for reevaluation within 7 to 10 days.     ED Prescriptions     Medication Sig Dispense Auth. Provider   nirmatrelvir/ritonavir, renal dosing, (PAXLOVID, 150/100,) 10 x 150 MG & 10 x 100MG  TBPK Take 2 tablets by mouth 2 (two) times daily for 5 days. Dosage for moderate renal impairment (eGFR >/= 30 to <60 mL/min): 150 mg nirmatrelvir (one 150 mg tablet) with 100 mg ritonavir (one 100 mg tablet), with both tablets taken together twice daily for 5 days. Not recommended if eGFR < 30 mL/min. PAXLOVID is not recommend in patients with severe hepatic impairment (Child-Pugh Class C). 20 tablet Bing Neighbors, NP   predniSONE (DELTASONE) 10 MG tablet Take 1 tablet (10 mg total) by mouth 2 (two) times daily  with a meal for 5 days. 10 tablet Bing Neighbors, NP   promethazine-dextromethorphan (PROMETHAZINE-DM) 6.25-15 MG/5ML syrup Take 5 mLs by mouth 4 (four) times daily as needed for cough. 180 mL Bing Neighbors, NP      PDMP not reviewed this encounter.   Bing Neighbors, NP 11/20/23 1750

## 2023-11-20 NOTE — Discharge Instructions (Addendum)
Symptoms will gradually improve.  If any of your symptoms worsen at any point or you develop significant difficulty breathing go immediately to the emergency department.  If your symptoms improve but do not typically improve return for reevaluation within 7 to 10 days.

## 2023-11-21 ENCOUNTER — Encounter: Payer: Self-pay | Admitting: Hematology and Oncology

## 2023-11-21 ENCOUNTER — Encounter (HOSPITAL_COMMUNITY): Payer: Self-pay | Admitting: Emergency Medicine

## 2023-11-21 ENCOUNTER — Other Ambulatory Visit (HOSPITAL_COMMUNITY): Payer: Self-pay

## 2023-11-21 ENCOUNTER — Other Ambulatory Visit: Payer: Self-pay

## 2023-11-21 ENCOUNTER — Emergency Department (HOSPITAL_COMMUNITY): Payer: Medicare Other

## 2023-11-21 ENCOUNTER — Inpatient Hospital Stay (HOSPITAL_COMMUNITY)
Admission: EM | Admit: 2023-11-21 | Discharge: 2023-11-23 | DRG: 640 | Disposition: A | Discharge status: Home / Self Care | Payer: Medicare Other | Attending: Internal Medicine | Admitting: Internal Medicine

## 2023-11-21 DIAGNOSIS — E860 Dehydration: Secondary | ICD-10-CM | POA: Diagnosis present

## 2023-11-21 DIAGNOSIS — E861 Hypovolemia: Secondary | ICD-10-CM | POA: Diagnosis present

## 2023-11-21 DIAGNOSIS — E871 Hypo-osmolality and hyponatremia: Principal | ICD-10-CM | POA: Diagnosis present

## 2023-11-21 DIAGNOSIS — E441 Mild protein-calorie malnutrition: Secondary | ICD-10-CM | POA: Diagnosis present

## 2023-11-21 DIAGNOSIS — D649 Anemia, unspecified: Secondary | ICD-10-CM | POA: Diagnosis present

## 2023-11-21 DIAGNOSIS — Z791 Long term (current) use of non-steroidal anti-inflammatories (NSAID): Secondary | ICD-10-CM | POA: Diagnosis not present

## 2023-11-21 DIAGNOSIS — C50412 Malignant neoplasm of upper-outer quadrant of left female breast: Secondary | ICD-10-CM | POA: Diagnosis present

## 2023-11-21 DIAGNOSIS — I110 Hypertensive heart disease with heart failure: Secondary | ICD-10-CM | POA: Diagnosis present

## 2023-11-21 DIAGNOSIS — R5381 Other malaise: Secondary | ICD-10-CM | POA: Diagnosis not present

## 2023-11-21 DIAGNOSIS — K219 Gastro-esophageal reflux disease without esophagitis: Secondary | ICD-10-CM | POA: Diagnosis present

## 2023-11-21 DIAGNOSIS — Z79899 Other long term (current) drug therapy: Secondary | ICD-10-CM | POA: Diagnosis not present

## 2023-11-21 DIAGNOSIS — R059 Cough, unspecified: Secondary | ICD-10-CM | POA: Diagnosis not present

## 2023-11-21 DIAGNOSIS — K5190 Ulcerative colitis, unspecified, without complications: Secondary | ICD-10-CM | POA: Diagnosis present

## 2023-11-21 DIAGNOSIS — Z171 Estrogen receptor negative status [ER-]: Secondary | ICD-10-CM

## 2023-11-21 DIAGNOSIS — I10 Essential (primary) hypertension: Secondary | ICD-10-CM | POA: Diagnosis present

## 2023-11-21 DIAGNOSIS — R627 Adult failure to thrive: Secondary | ICD-10-CM | POA: Diagnosis present

## 2023-11-21 DIAGNOSIS — F3340 Major depressive disorder, recurrent, in remission, unspecified: Secondary | ICD-10-CM | POA: Diagnosis present

## 2023-11-21 DIAGNOSIS — U071 COVID-19: Secondary | ICD-10-CM | POA: Diagnosis not present

## 2023-11-21 DIAGNOSIS — F419 Anxiety disorder, unspecified: Secondary | ICD-10-CM | POA: Diagnosis present

## 2023-11-21 DIAGNOSIS — Z803 Family history of malignant neoplasm of breast: Secondary | ICD-10-CM

## 2023-11-21 DIAGNOSIS — Z6824 Body mass index (BMI) 24.0-24.9, adult: Secondary | ICD-10-CM | POA: Diagnosis not present

## 2023-11-21 DIAGNOSIS — E785 Hyperlipidemia, unspecified: Secondary | ICD-10-CM | POA: Diagnosis present

## 2023-11-21 DIAGNOSIS — E872 Acidosis, unspecified: Secondary | ICD-10-CM | POA: Diagnosis present

## 2023-11-21 DIAGNOSIS — Z79620 Long term (current) use of immunosuppressive biologic: Secondary | ICD-10-CM

## 2023-11-21 DIAGNOSIS — Z981 Arthrodesis status: Secondary | ICD-10-CM | POA: Diagnosis not present

## 2023-11-21 DIAGNOSIS — E876 Hypokalemia: Secondary | ICD-10-CM | POA: Diagnosis present

## 2023-11-21 DIAGNOSIS — R5383 Other fatigue: Secondary | ICD-10-CM | POA: Diagnosis not present

## 2023-11-21 DIAGNOSIS — I5032 Chronic diastolic (congestive) heart failure: Secondary | ICD-10-CM

## 2023-11-21 DIAGNOSIS — Z9012 Acquired absence of left breast and nipple: Secondary | ICD-10-CM

## 2023-11-21 DIAGNOSIS — R531 Weakness: Secondary | ICD-10-CM | POA: Diagnosis not present

## 2023-11-21 DIAGNOSIS — Z801 Family history of malignant neoplasm of trachea, bronchus and lung: Secondary | ICD-10-CM

## 2023-11-21 DIAGNOSIS — Z888 Allergy status to other drugs, medicaments and biological substances status: Secondary | ICD-10-CM | POA: Diagnosis not present

## 2023-11-21 DIAGNOSIS — Z28310 Unvaccinated for covid-19: Secondary | ICD-10-CM

## 2023-11-21 DIAGNOSIS — R509 Fever, unspecified: Secondary | ICD-10-CM | POA: Diagnosis not present

## 2023-11-21 LAB — COMPREHENSIVE METABOLIC PANEL
ALT: 18 U/L (ref 0–44)
AST: 23 U/L (ref 15–41)
Albumin: 3.4 g/dL — ABNORMAL LOW (ref 3.5–5.0)
Alkaline Phosphatase: 66 U/L (ref 38–126)
Anion gap: 10 (ref 5–15)
BUN: 12 mg/dL (ref 8–23)
CO2: 21 mmol/L — ABNORMAL LOW (ref 22–32)
Calcium: 8.3 mg/dL — ABNORMAL LOW (ref 8.9–10.3)
Chloride: 95 mmol/L — ABNORMAL LOW (ref 98–111)
Creatinine, Ser: 0.97 mg/dL (ref 0.44–1.00)
GFR, Estimated: 60 mL/min — ABNORMAL LOW (ref >60)
Glucose, Bld: 178 mg/dL — ABNORMAL HIGH (ref 70–99)
Potassium: 3.2 mmol/L — ABNORMAL LOW (ref 3.5–5.1)
Sodium: 126 mmol/L — ABNORMAL LOW (ref 135–145)
Total Bilirubin: 0.4 mg/dL (ref 0.0–1.2)
Total Protein: 6.1 g/dL — ABNORMAL LOW (ref 6.5–8.1)

## 2023-11-21 LAB — CBC WITH DIFFERENTIAL/PLATELET
Abs Immature Granulocytes: 0.02 10*3/uL (ref 0.00–0.07)
Basophils Absolute: 0 10*3/uL (ref 0.0–0.1)
Basophils Relative: 0 %
Eosinophils Absolute: 0 10*3/uL (ref 0.0–0.5)
Eosinophils Relative: 0 %
HCT: 30.3 % — ABNORMAL LOW (ref 36.0–46.0)
Hemoglobin: 9.9 g/dL — ABNORMAL LOW (ref 12.0–15.0)
Immature Granulocytes: 0 %
Lymphocytes Relative: 5 %
Lymphs Abs: 0.3 10*3/uL — ABNORMAL LOW (ref 0.7–4.0)
MCH: 27.9 pg (ref 26.0–34.0)
MCHC: 32.7 g/dL (ref 30.0–36.0)
MCV: 85.4 fL (ref 80.0–100.0)
Monocytes Absolute: 0.3 10*3/uL (ref 0.1–1.0)
Monocytes Relative: 7 %
Neutro Abs: 4.2 10*3/uL (ref 1.7–7.7)
Neutrophils Relative %: 88 %
Platelets: 171 10*3/uL (ref 150–400)
RBC: 3.55 MIL/uL — ABNORMAL LOW (ref 3.87–5.11)
RDW: 13.1 % (ref 11.5–15.5)
WBC: 4.8 10*3/uL (ref 4.0–10.5)
nRBC: 0 % (ref 0.0–0.2)

## 2023-11-21 LAB — URINALYSIS, ROUTINE W REFLEX MICROSCOPIC
Bilirubin Urine: NEGATIVE
Glucose, UA: NEGATIVE mg/dL
Hgb urine dipstick: NEGATIVE
Ketones, ur: NEGATIVE mg/dL
Leukocytes,Ua: NEGATIVE
Nitrite: NEGATIVE
Protein, ur: NEGATIVE mg/dL
Specific Gravity, Urine: 1.016 (ref 1.005–1.030)
pH: 6 (ref 5.0–8.0)

## 2023-11-21 LAB — PHOSPHORUS: Phosphorus: 3.1 mg/dL (ref 2.5–4.6)

## 2023-11-21 LAB — MAGNESIUM: Magnesium: 1.8 mg/dL (ref 1.7–2.4)

## 2023-11-21 MED ORDER — MAGNESIUM SULFATE 2 GM/50ML IV SOLN
2.0000 g | Freq: Once | INTRAVENOUS | Status: AC
  Administered 2023-11-21: 2 g via INTRAVENOUS
  Filled 2023-11-21: qty 50

## 2023-11-21 MED ORDER — LOSARTAN POTASSIUM 100 MG PO TABS: 100.0000 mg | ORAL_TABLET | Freq: Every day | ORAL | 3 refills | Status: DC

## 2023-11-21 MED ORDER — ONDANSETRON HCL 4 MG PO TABS: 4.0000 mg | ORAL_TABLET | Freq: Four times a day (QID) | ORAL | Status: DC | PRN

## 2023-11-21 MED ORDER — VENLAFAXINE HCL ER 75 MG PO CP24
75.0000 mg | ORAL_CAPSULE | Freq: Every day | ORAL | Status: DC
  Administered 2023-11-22 – 2023-11-23 (×2): 75 mg via ORAL
  Filled 2023-11-21 (×2): qty 1

## 2023-11-21 MED ORDER — ACETAMINOPHEN 650 MG RE SUPP: 650.0000 mg | Freq: Four times a day (QID) | RECTAL | Status: DC | PRN

## 2023-11-21 MED ORDER — NIRMATRELVIR/RITONAVIR (PAXLOVID)TABLET
3.0000 | ORAL_TABLET | Freq: Two times a day (BID) | ORAL | Status: DC
  Administered 2023-11-21 – 2023-11-23 (×4): 3 via ORAL
  Filled 2023-11-21 (×2): qty 30

## 2023-11-21 MED ORDER — IPRATROPIUM-ALBUTEROL 0.5-2.5 (3) MG/3ML IN SOLN
3.0000 mL | Freq: Three times a day (TID) | RESPIRATORY_TRACT | Status: DC
  Administered 2023-11-21 – 2023-11-22 (×2): 3 mL via RESPIRATORY_TRACT
  Filled 2023-11-21 (×2): qty 3

## 2023-11-21 MED ORDER — GABAPENTIN 100 MG PO CAPS
100.0000 mg | ORAL_CAPSULE | Freq: Every day | ORAL | Status: DC
  Administered 2023-11-21 – 2023-11-22 (×2): 100 mg via ORAL
  Filled 2023-11-21 (×2): qty 1

## 2023-11-21 MED ORDER — NIRMATRELVIR&RITONAVIR 150/100 10 X 150 MG & 10 X 100MG PO TBPK: 2.0000 | ORAL_TABLET | Freq: Two times a day (BID) | ORAL | Status: DC

## 2023-11-21 MED ORDER — DIPHENHYDRAMINE HCL 25 MG PO CAPS
25.0000 mg | ORAL_CAPSULE | Freq: Every day | ORAL | Status: DC
  Administered 2023-11-22 – 2023-11-23 (×2): 25 mg via ORAL
  Filled 2023-11-21 (×2): qty 1

## 2023-11-21 MED ORDER — GUAIFENESIN-DM 100-10 MG/5ML PO SYRP
5.0000 mL | ORAL_SOLUTION | ORAL | Status: DC | PRN
  Administered 2023-11-21 – 2023-11-22 (×3): 5 mL via ORAL
  Filled 2023-11-21 (×3): qty 10

## 2023-11-21 MED ORDER — TRAZODONE HCL 50 MG PO TABS
225.0000 mg | ORAL_TABLET | Freq: Every day | ORAL | Status: DC
  Administered 2023-11-21 – 2023-11-22 (×2): 225 mg via ORAL
  Filled 2023-11-21 (×2): qty 1

## 2023-11-21 MED ORDER — SODIUM CHLORIDE 0.9 % IV BOLUS
1000.0000 mL | Freq: Once | INTRAVENOUS | Status: AC
  Administered 2023-11-21: 1000 mL via INTRAVENOUS

## 2023-11-21 MED ORDER — ONDANSETRON HCL 4 MG/2ML IJ SOLN: 4.0000 mg | Freq: Four times a day (QID) | INTRAMUSCULAR | Status: DC | PRN

## 2023-11-21 MED ORDER — SODIUM CHLORIDE 0.9 % IV SOLN: INTRAVENOUS | Status: AC

## 2023-11-21 MED ORDER — PREDNISONE 10 MG PO TABS
10.0000 mg | ORAL_TABLET | Freq: Two times a day (BID) | ORAL | Status: DC
  Administered 2023-11-22 – 2023-11-23 (×3): 10 mg via ORAL
  Filled 2023-11-21 (×3): qty 1

## 2023-11-21 MED ORDER — CHLORHEXIDINE GLUCONATE CLOTH 2 % EX PADS
6.0000 | MEDICATED_PAD | Freq: Every day | CUTANEOUS | Status: DC
  Administered 2023-11-22 – 2023-11-23 (×2): 6 via TOPICAL

## 2023-11-21 MED ORDER — LOSARTAN POTASSIUM 50 MG PO TABS
100.0000 mg | ORAL_TABLET | Freq: Every day | ORAL | Status: DC
  Administered 2023-11-21 – 2023-11-23 (×3): 100 mg via ORAL
  Filled 2023-11-21 (×3): qty 2

## 2023-11-21 MED ORDER — POTASSIUM CHLORIDE CRYS ER 20 MEQ PO TBCR
40.0000 meq | EXTENDED_RELEASE_TABLET | Freq: Once | ORAL | Status: AC
  Administered 2023-11-21: 40 meq via ORAL
  Filled 2023-11-21: qty 4
  Filled 2023-11-21: qty 2

## 2023-11-21 MED ORDER — BUPROPION HCL ER (XL) 300 MG PO TB24
300.0000 mg | ORAL_TABLET | Freq: Every day | ORAL | Status: DC
  Administered 2023-11-21 – 2023-11-23 (×3): 300 mg via ORAL
  Filled 2023-11-21 (×3): qty 1

## 2023-11-21 MED ORDER — CLONAZEPAM 1 MG PO TABS
1.0000 mg | ORAL_TABLET | Freq: Every day | ORAL | Status: DC
  Administered 2023-11-21 – 2023-11-22 (×2): 1 mg via ORAL
  Filled 2023-11-21 (×2): qty 1

## 2023-11-21 MED ORDER — SODIUM CHLORIDE 0.9% FLUSH
10.0000 mL | INTRAVENOUS | Status: DC | PRN
  Administered 2023-11-22: 10 mL

## 2023-11-21 MED ORDER — POTASSIUM CHLORIDE 10 MEQ/100ML IV SOLN
10.0000 meq | Freq: Once | INTRAVENOUS | Status: AC
  Administered 2023-11-21: 10 meq via INTRAVENOUS
  Filled 2023-11-21: qty 100

## 2023-11-21 MED ORDER — ACETAMINOPHEN 325 MG PO TABS
650.0000 mg | ORAL_TABLET | Freq: Four times a day (QID) | ORAL | Status: DC | PRN
  Administered 2023-11-21 (×2): 650 mg via ORAL
  Filled 2023-11-21 (×2): qty 2

## 2023-11-21 NOTE — ED Triage Notes (Signed)
Pt arives via EMS from home with c/o worsening fatigue. Dx with COVID yesterday. Hx breast cancer.

## 2023-11-21 NOTE — ED Provider Notes (Signed)
[place] [facility]ERGENCY DEPARTMENT AT [facility] Provider Note   CSN: [id number] Arrival date & time: 11/21/23  0335     History  Chief Complaint  Patient presents with   Fatigue    Allison Pierce is a 78 y.o. female.  HPI     This is a 78 year old female who presents with generalized fatigue.  Was seen and evaluated yesterday urgent care and found to have COVID.  Husband reports that they got back home and she could barely walk into the front door.  She was unable to get up and go to the bathroom on her own.  Reports that she generally cannot get up and go.  Has a history of breast cancer and is undergoing treatment.  Reports cough but no ongoing fever.  Home Medications Prior to Admission medications   Medication Sig Start Date End Date Taking? Authorizing Provider  B Complex-C (B COMPLEX-VITAMIN C) CAPS Take 1 capsule by mouth daily.    [provider]  buPROPion (WELLBUTRIN XL) 300 MG 24 hr tablet 1 tablet in the morning Orally Once a day    [provider]  cholecalciferol (VITAMIN D3) 25 MCG (1000 UNIT) tablet Take 1,000 Units by mouth daily.    [provider]  clonazePAM (KLONOPIN) 1 MG tablet Take 1 mg by mouth at bedtime.    [provider]  CLONAZEPAM PO Take by mouth.    [provider]  Cyanocobalamin (VITAMIN B-12) 5000 MCG TBDP Take 5,000 Units by mouth daily.    [provider]  diphenhydrAMINE (BENADRYL) 25 MG tablet Take 25 mg by mouth in the morning.    [provider]  fluticasone (FLONASE) 50 MCG/ACT nasal spray Place 1 spray into both nostrils daily as needed for allergies.    [provider]  gabapentin (NEURONTIN) 100 MG capsule Take 1 capsule (100 mg total) by mouth at bedtime. 05/10/23   Namon Cirri E, PA-C  lidocaine-prilocaine (EMLA) cream Apply 1 Application topically as needed. Apply one application daily as needed to port a cath site. 12/12/22   Serena Croissant, MD  losartan (COZAAR) 100 MG tablet Take 1 tablet (100 mg total) by mouth daily. 02/16/23   Bensimhon, Bevelyn Buckles, MD  meloxicam (MOBIC) 15 MG tablet Take 15 mg by mouth daily. 11/15/23   [provider]  nirmatrelvir/ritonavir, renal dosing, (PAXLOVID, 150/100,) 10 x 150 MG & 10 x 100MG  TBPK Take 2 tablets by mouth 2 (two) times daily for 5 days. Dosage for moderate renal impairment (eGFR >/= 30 to <60 mL/min): 150 mg nirmatrelvir (one 150 mg tablet) with 100 mg ritonavir (one 100 mg tablet), with both tablets taken together twice daily for 5 days. Not recommended if eGFR < 30 mL/min. PAXLOVID is not recommend in patients with severe hepatic impairment (Child-Pugh Class C). 11/20/23 11/25/23  Bing Neighbors, NP  pantoprazole (PROTONIX) 40 MG tablet Take 1 tablet (40 mg total) by mouth 2 (two) times daily. 08/03/23   Cirigliano, Vito V, DO  predniSONE (DELTASONE) 10 MG tablet Take 1 tablet (10 mg total) by mouth 2 (two) times daily with a meal for 5 days. 11/20/23 11/25/23  Bing Neighbors, NP  promethazine-dextromethorphan (PROMETHAZINE-DM) 6.25-15 MG/5ML syrup Take 5 mLs by mouth 4 (four) times daily as needed for cough. 11/20/23   Bing Neighbors, NP  simvastatin (ZOCOR) 40 MG tablet Take by mouth.    [provider]  spironolactone (ALDACTONE) 25 MG tablet Take 0.5 tablets (12.5  mg total) by mouth daily. 09/17/23   Bensimhon, Bevelyn Buckles, MD  traZODone (DESYREL) 150 MG tablet Take 225 mg by mouth at bedtime.    [provider]  valACYclovir (VALTREX) 1000 MG tablet 1 tablet Orally 3 times a day As needed 05/18/22   [provider]  venlafaxine XR (EFFEXOR-XR) 75 MG 24 hr capsule Take 75 mg by mouth daily with breakfast.    [provider]  prochlorperazine (COMPAZINE) 10 MG tablet Take 1 tablet (10 mg total) by mouth every 6 (six) hours as needed (Nausea or vomiting). Patient not taking: Reported on 05/12/2022 04/27/22 06/07/22  Serena Croissant, MD       Allergies    Oxybutynin, Prozac [fluoxetine hcl], Ritalin [methylphenidate], Rosuvastatin, and Lyrica [pregabalin]    Review of Systems   Review of Systems  Constitutional:  Positive for fatigue. Negative for fever.  Respiratory:  Positive for cough.   Cardiovascular:  Negative for leg swelling.  Gastrointestinal:  Negative for abdominal pain, nausea and vomiting.  Neurological:  Positive for weakness.  All other systems reviewed and are negative.   Physical Exam Updated Vital Signs BP 129/63   Pulse 68   Temp 98.1 F (36.7 C) (Oral)   Resp 18   Ht 1.676 m (5\' 6" )   Wt 68 kg   SpO2 92%   BMI 24.21 kg/m  Physical Exam Vitals and nursing note reviewed.  Constitutional:      Appearance: She is well-developed. She is not ill-appearing.  HENT:     Head: Normocephalic and atraumatic.  Eyes:     Pupils: Pupils are equal, round, and reactive to light.  Cardiovascular:     Rate and Rhythm: Normal rate and regular rhythm.     Heart sounds: Normal heart sounds.  Pulmonary:     Effort: Pulmonary effort is normal. No respiratory distress.     Breath sounds: No wheezing.  Abdominal:     General: Bowel sounds are normal.     Palpations: Abdomen is soft.     Tenderness: There is no abdominal tenderness. There is no guarding or rebound.  Musculoskeletal:     Cervical back: Neck supple.  Skin:    General: Skin is warm and dry.  Neurological:     Mental Status: She is alert and oriented to person, place, and time.  Psychiatric:        Mood and Affect: Mood normal.     ED Results / Procedures / Treatments   Labs (all labs ordered are listed, but only abnormal results are displayed) Labs Reviewed  CBC WITH DIFFERENTIAL/PLATELET - Abnormal; Notable for the following components:      Result Value   RBC 3.55 (*)    Hemoglobin 9.9 (*)    HCT 30.3 (*)    Lymphs Abs 0.3 (*)    All other components within normal limits  COMPREHENSIVE METABOLIC PANEL - Abnormal; Notable for  the following components:   Sodium 126 (*)    Potassium 3.2 (*)    Chloride 95 (*)    CO2 21 (*)    Glucose, Bld 178 (*)    Calcium 8.3 (*)    Total Protein 6.1 (*)    Albumin 3.4 (*)    GFR, Estimated 60 (*)    All other components within normal limits  URINALYSIS, ROUTINE W REFLEX MICROSCOPIC    EKG None  Radiology No results found.  Procedures Procedures    Medications Ordered in ED Medications  potassium chloride 10  mEq in 100 mL IVPB (10 mEq Intravenous New Bag/Given 11/21/23 0602)  sodium chloride 0.9 % bolus 1,000 mL (1,000 mLs Intravenous New Bag/Given 11/21/23 0540)    ED Course/ Medical Decision Making/ A&P                                 Medical Decision Making Amount and/or Complexity of Data Reviewed Labs: ordered. Radiology: ordered.  Risk Prescription drug management. Decision regarding hospitalization.   This patient presents to the ED for concern of generalized weakness and fatigue, this involves an extensive number of treatment options, and is a complaint that carries with it a high risk of complications and morbidity.  I considered the following differential and admission for this acute, potentially life threatening condition.  The differential diagnosis includes acute viral illness, metabolic derangement, dehydration, UTI  MDM:    This is a 78 year old female who was diagnosed with COVID yesterday.  She had progressive fatigue and weakness at home.  She is overall nontoxic-appearing.  Vital signs are reassuring.  She is afebrile here.  Labs notable for hyponatremia to 126 and hypokalemia to 3.2.  Patient was given a normal saline bolus and IV potassium.  Labs otherwise notable for hemoglobin of 9.9.  Baseline is 10-11.  Urinalysis without evidence of UTI.  Chest x-ray reviewed by myself and does not show any obvious pneumonia.  Suspect that her generalized weakness and fatigue is related to acute COVID and metabolic derangements.  Will plan for  admission for fluid resuscitation and electrolyte replacement as well as PT evaluation.  Husband updated at the bedside.  (Labs, imaging, consults)  Labs: I Ordered, and personally interpreted labs.  The pertinent results include: CBC, CMP, urinalysis  Imaging Studies ordered: I ordered imaging studies including chest x-ray I independently visualized and interpreted imaging. I agree with the radiologist interpretation  Additional history obtained from chart review.  External records from outside source obtained and reviewed including urgent care notes  Cardiac Monitoring: The patient was maintained on a cardiac monitor.  If on the cardiac monitor, I personally viewed and interpreted the cardiac monitored which showed an underlying rhythm of: Sinus  Reevaluation: After the interventions noted above, I reevaluated the patient and found that they have :stayed the same  Social Determinants of Health:  lives independently  Disposition: Discharge  Co morbidities that complicate the patient evaluation  Past Medical History:  Diagnosis Date   Anxiety    C. difficile diarrhea    Depression    GERD (gastroesophageal reflux disease) OCCASIONALLY  TAKE TUMS   Human papilloma virus    Hyperlipidemia    Hypertension    left breast ca 12/2021   UC (ulcerative colitis) (HCC)    UTI (urinary tract infection)    Vulvar lesion      Medicines Meds ordered this encounter  Medications   sodium chloride 0.9 % bolus 1,000 mL   potassium chloride 10 mEq in 100 mL IVPB    I have reviewed the patients home medicines and have made adjustments as needed  Problem List / ED Course: Problem List Items Addressed This Visit   None Visit Diagnoses       Hyponatremia    -  Primary     COVID-19         Hypokalemia  Final Clinical Impression(s) / ED Diagnoses Final diagnoses:  Hyponatremia  COVID-19  Hypokalemia    Rx / DC Orders ED Discharge Orders      None         Shon Baton, MD 11/21/23 [phone]

## 2023-11-21 NOTE — H&P (Signed)
History and Physical    Patient: Allison Pierce BJY:[id number] DOB: 20-Jan-1946 DOA: 11/21/2023 DOS: the patient was seen and examined on 11/21/2023 PCP: Lupita Raider, MD  Patient coming from: Home  Chief Complaint:  Chief Complaint  Patient presents with   Fatigue   HPI: Allison Pierce is a 78 y.o. female with medical history significant of anxiety, C. difficile diarrhea, depression, GERD, hypertension, hyperlipidemia, left breast cancer, ulcerative colitis, UTI, human papilloma virus, vulvar lesion who was diagnosed with COVID-19 yesterday afternoon after presenting to the emergency department with URI symptoms associated with fatigue, malaise, arthralgias and myalgias since Sunday.  She has been so weak that has being unable to do her ADLs.  No wheezing or hemoptysis.  No chest pain, palpitations, diaphoresis, PND, orthopnea or pitting edema of the lower extremities.  No abdominal pain, nausea, emesis, diarrhea, constipation, melena or hematochezia.  No flank pain, dysuria, frequency or hematuria.  No polyuria, polydipsia, polyphagia or blurred vision.   Lab work: Her urinalysis was normal.  CBC showed a white count 4.8, hemoglobin 9.9 g/dL platelets 086.  CMP with a Sainani 26, potassium 3.2, chloride 95 and CO2 21 mmol/L.  Renal function was normal.  Glucose under 70 mg/dL, total protein 6.1 and albumin 3.4 g/dL, the rest of the LFTs and calcium are normal after correction. Magnesium was 1.8 and phosphorus 3.1 mg/dL.  Imaging: Portable 1 view chest radiograph with limited view of the lower lung zones, but no evidence of acute chest disease.   ED course: Initial vital signs were temperature 98.1 F, pulse 75, respiration 16, BP 142/70 mmHg O2 sat 94% on room air.  The patient received KCl 10 mEq IVPB, KCl 40 mEq p.o. x 1 and 1000 mL of normal saline bolus.  Review of Systems: As mentioned in the history of present illness. All other systems reviewed and are negative. Past  Medical History:  Diagnosis Date   Anxiety    C. difficile diarrhea    Depression    GERD (gastroesophageal reflux disease) OCCASIONALLY  TAKE TUMS   Human papilloma virus    Hyperlipidemia    Hypertension    left breast ca 12/2021   UC (ulcerative colitis) (HCC)    UTI (urinary tract infection)    Vulvar lesion    Past Surgical History:  Procedure Laterality Date   ABDOMINAL HYSTERECTOMY  1992   W/ SALPINGO-OOPHORECTOMY   ANTERIOR CERVICAL DECOMP/DISCECTOMY FUSION  01-18-2009  DR POOLE   C4  - C6   AUGMENTATION MAMMAPLASTY Bilateral    BREAST BIOPSY Right    BREAST BIOPSY Left 02/10/2022   x2   BREAST ENHANCEMENT SURGERY  2011   BREAST EXCISIONAL BIOPSY Left    BREAST RECONSTRUCTION WITH PLACEMENT OF TISSUE EXPANDER AND FLEX HD (ACELLULAR HYDRATED DERMIS) Left 04/07/2022   Procedure: BREAST RECONSTRUCTION WITH PLACEMENT OF TISSUE EXPANDER AND FLEX HD (ACELLULAR HYDRATED DERMIS);  Surgeon: Allena Napoleon, MD;  Location: [facility] OR;  Service: Plastics;  Laterality: Left;   BREAST SURGERY  02-04-2011  dr Jamey Ripa   EXCISION LEFT BREAST MASS--  CALCIFICATION   EYE SURGERY Bilateral    cataract removal   MASTECTOMY W/ SENTINEL NODE BIOPSY Left 04/07/2022   Procedure: LEFT MASTECTOMY WITH SENTINEL NODE BIOPSY;  Surgeon: Abigail Miyamoto, MD;  Location: [facility] OR;  Service: General;  Laterality: Left;  LMA   PORTACATH PLACEMENT N/A 04/07/2022   Procedure: PORT-A-CATH INSERTION WITH ULTRASOUND GUIDANCE;  Surgeon: Abigail Miyamoto, MD;  Location: [facility] OR;  Service: General;  Laterality: N/A;   REMOVAL OF TISSUE EXPANDER AND PLACEMENT OF IMPLANT Left 03/16/2023   Procedure: Removal of left breast tissue expander and placement of permanent implant;  Surgeon: [name], MD;  Location: [facility];  Service: Plastics;  Laterality: Left;   VULVAR LESION REMOVAL  09/10/2012   Procedure: VULVAR LESION;  Surgeon: Gretta Cool, MD;  Location: [facility];  Service:  Gynecology;  Laterality: N/A;  WIDE EXCISION OF VULVAR LESION   Social History:  reports that she has never smoked. She has never used smokeless tobacco. She reports current alcohol use of about 7.0 standard drinks of alcohol per week. She reports that she does not use drugs.  Allergies  Allergen Reactions   Oxybutynin Other (See Comments)    Unknown per Pt    Prozac [Fluoxetine Hcl] Other (See Comments)    "Could not lift head up or get out of bed"    Ritalin [Methylphenidate] Other (See Comments)    Unknown per Pt    Rosuvastatin Other (See Comments)    Unknown per Pt    Lyrica [Pregabalin] Anxiety and Other (See Comments)    "Felt weird"    Family History  Problem Relation Age of Onset   Lung cancer Mother 22       she smoked   Prostate cancer Brother 60   Breast cancer Cousin        paternal first cousin   Breast cancer Cousin        paternal first cousin   Liver disease Neg Hx    Esophageal cancer Neg Hx    Colon cancer Neg Hx     Prior to Admission medications   Medication Sig Start Date End Date Taking? Authorizing Provider  B Complex-C (B COMPLEX-VITAMIN C) CAPS Take 1 capsule by mouth daily.    [provider]  buPROPion (WELLBUTRIN XL) 300 MG 24 hr tablet 1 tablet in the morning Orally Once a day    [provider]  cholecalciferol (VITAMIN D3) 25 MCG (1000 UNIT) tablet Take 1,000 Units by mouth daily.    [provider]  clonazePAM (KLONOPIN) 1 MG tablet Take 1 mg by mouth at bedtime.    [provider]  CLONAZEPAM PO Take by mouth.    [provider]  Cyanocobalamin (VITAMIN B-12) 5000 MCG TBDP Take 5,000 Units by mouth daily.    [provider]  diphenhydrAMINE (BENADRYL) 25 MG tablet Take 25 mg by mouth in the morning.    [provider]  fluticasone (FLONASE) 50 MCG/ACT nasal spray Place 1 spray into both nostrils daily as needed for allergies.    [provider]  gabapentin (NEURONTIN)  100 MG capsule Take 1 capsule (100 mg total) by mouth at bedtime. 05/10/23   Namon Cirri E, PA-C  lidocaine-prilocaine (EMLA) cream Apply 1 Application topically as needed. Apply one application daily as needed to port a cath site. 12/12/22   Serena Croissant, MD  losartan (COZAAR) 100 MG tablet Take 1 tablet (100 mg total) by mouth daily. 02/16/23   Bensimhon, Bevelyn Buckles, MD  meloxicam (MOBIC) 15 MG tablet Take 15 mg by mouth daily. 11/15/23   [provider]  nirmatrelvir/ritonavir, renal dosing, (PAXLOVID, 150/100,) 10 x 150 MG & 10 x 100MG  TBPK Take 2 tablets by mouth 2 (two) times daily for 5 days. Dosage for moderate renal impairment (eGFR >/= 30 to <60 mL/min): 150 mg nirmatrelvir (one 150 mg tablet) with 100 mg  ritonavir (one 100 mg tablet), with both tablets taken together twice daily for 5 days. Not recommended if eGFR < 30 mL/min. PAXLOVID is not recommend in patients with severe hepatic impairment (Child-Pugh Class C). 11/20/23 11/25/23  Bing Neighbors, NP  pantoprazole (PROTONIX) 40 MG tablet Take 1 tablet (40 mg total) by mouth 2 (two) times daily. 08/03/23   Cirigliano, Vito V, DO  predniSONE (DELTASONE) 10 MG tablet Take 1 tablet (10 mg total) by mouth 2 (two) times daily with a meal for 5 days. 11/20/23 11/25/23  Bing Neighbors, NP  promethazine-dextromethorphan (PROMETHAZINE-DM) 6.25-15 MG/5ML syrup Take 5 mLs by mouth 4 (four) times daily as needed for cough. 11/20/23   Bing Neighbors, NP  simvastatin (ZOCOR) 40 MG tablet Take by mouth.    [provider]  spironolactone (ALDACTONE) 25 MG tablet Take 0.5 tablets (12.5 mg total) by mouth daily. 09/17/23   Bensimhon, Bevelyn Buckles, MD  traZODone (DESYREL) 150 MG tablet Take 225 mg by mouth at bedtime.    [provider]  valACYclovir (VALTREX) 1000 MG tablet 1 tablet Orally 3 times a day As needed 05/18/22   [provider]  venlafaxine XR (EFFEXOR-XR) 75 MG 24 hr capsule Take 75 mg by mouth daily with  breakfast.    [provider]  prochlorperazine (COMPAZINE) 10 MG tablet Take 1 tablet (10 mg total) by mouth every 6 (six) hours as needed (Nausea or vomiting). Patient not taking: Reported on 05/12/2022 04/27/22 06/07/22  Serena Croissant, MD    Physical Exam: Vitals:   11/21/23 0600 11/21/23 0700 11/21/23 0730 11/21/23 0743  BP: 136/63 138/67 136/82   Pulse: 67 64 78 69  Resp: 19 15 (!) 21 16  Temp:    98.2 F (36.8 C)  TempSrc:    Oral  SpO2: 93% 91% 92% 92%  Weight:      Height:       Physical Exam Vitals and nursing note reviewed.  Constitutional:      Appearance: Normal appearance. She is normal weight. She is ill-appearing.  HENT:     Head: Normocephalic.     Nose: Rhinorrhea present.     Mouth/Throat:     Mouth: Mucous membranes are dry.     Pharynx: Posterior oropharyngeal erythema present.  Eyes:     General: No scleral icterus.    Pupils: Pupils are equal, round, and reactive to light.  Cardiovascular:     Rate and Rhythm: Normal rate and regular rhythm.  Pulmonary:     Effort: Pulmonary effort is normal.     Breath sounds: Normal breath sounds. No wheezing, rhonchi or rales.  Abdominal:     General: Bowel sounds are normal. There is no distension.     Palpations: Abdomen is soft.     Tenderness: There is no abdominal tenderness. There is no guarding.  Musculoskeletal:     Cervical back: Neck supple.     Right lower leg: No edema.     Left lower leg: No edema.     Comments: Moderate generalized weakness.  Skin:    General: Skin is warm and dry.  Neurological:     General: No focal deficit present.     Mental Status: She is alert and oriented to person, place, and time.  Psychiatric:        Mood and Affect: Mood normal.        Behavior: Behavior normal.     Data Reviewed:  Results are pending, will review  when available. 09/17/2023 echocardiogram report. IMPRESSIONS:   1. Left ventricular ejection fraction, by estimation, is 60 to 65%. The   left ventricle has normal function. The left ventricle has no regional  wall motion abnormalities. Left ventricular diastolic parameters are  consistent with Grade I diastolic  dysfunction (impaired relaxation). Elevated left atrial pressure. The  average left ventricular global longitudinal strain is -15.9 %. The global  longitudinal strain is abnormal.   2. Right ventricular systolic function is normal. The right ventricular  size is normal.   3. The mitral valve is normal in structure. Mild mitral valve  regurgitation. No evidence of mitral stenosis.   4. The aortic valve is tricuspid. Aortic valve regurgitation is not  visualized. Aortic valve sclerosis is present, with no evidence of aortic  valve stenosis.   5. The inferior vena cava is normal in size with greater than 50%  respiratory variability, suggesting right atrial pressure of 3 mmHg.   EKG: Vent. rate 75 BPM PR interval 232 ms QRS duration 101 ms QT/QTcB 426/476 ms P-R-T axes 156 66 51 Sinus or ectopic atrial rhythm Prolonged PR interval Borderline repolarization abnormality  Assessment and Plan: Principal Problem:   Hyponatremia Observation/PCU. Free water fluid restriction. Continue normal saline infusion. Follow-up sodium level in AM.  Active Problems:   COVID-19 virus infection  Stated that she is unvaccinated. Continue Paxlovid twice daily. Continue prednisone Milligrams p.o. twice daily. URI symptoms and cough, no hypoxia. -Begin IV steroids if she becomes hypoxic. No GI symptoms at the moment..    Chronic diastolic CHF (congestive heart failure) (HCC) Currently volume depleted.    Essential hypertension Continue losartan 100 mg p.o. daily.    Gastroesophageal reflux disease Antiacid, H2 blocker or PPI as needed.    Malignant neoplasm of upper-outer quadrant of  left breast in female, estrogen receptor negative [facility]) Follow-up with oncology as an outpatient.    Recurrent major depression  in remission (HCC) Continue bupropion 300 mg p.o. daily. Continue trazodone 225 mg p.o. bedtime. Continue venlafaxine 75 mg p.o. in the morning.    Hyperlipidemia, unspecified Statin on hold while taking Paxlovid.     Advance Care Planning:   Code Status: Full Code   Consults:   Family Communication: Her husband was at bedside.  Severity of Illness: The appropriate patient status for this patient is OBSERVATION. Observation status is judged to be reasonable and necessary in order to provide the required intensity of service to ensure the patient's safety. The patient's presenting symptoms, physical exam findings, and initial radiographic and laboratory data in the context of their medical condition is felt to place them at decreased risk for further clinical deterioration. Furthermore, it is anticipated that the patient will be medically stable for discharge from the hospital within 2 midnights of admission.   Author: Bobette Mo, MD 11/21/2023 7:52 AM  For on call review [email or web address].   This document was prepared using Dragon voice recognition software and may contain some unintended transcription errors.

## 2023-11-22 DIAGNOSIS — E441 Mild protein-calorie malnutrition: Secondary | ICD-10-CM | POA: Diagnosis present

## 2023-11-22 DIAGNOSIS — C50412 Malignant neoplasm of upper-outer quadrant of left female breast: Secondary | ICD-10-CM | POA: Diagnosis present

## 2023-11-22 DIAGNOSIS — Z981 Arthrodesis status: Secondary | ICD-10-CM | POA: Diagnosis not present

## 2023-11-22 DIAGNOSIS — Z888 Allergy status to other drugs, medicaments and biological substances status: Secondary | ICD-10-CM | POA: Diagnosis not present

## 2023-11-22 DIAGNOSIS — D649 Anemia, unspecified: Secondary | ICD-10-CM | POA: Diagnosis present

## 2023-11-22 DIAGNOSIS — E872 Acidosis, unspecified: Secondary | ICD-10-CM | POA: Diagnosis present

## 2023-11-22 DIAGNOSIS — Z9012 Acquired absence of left breast and nipple: Secondary | ICD-10-CM | POA: Diagnosis not present

## 2023-11-22 DIAGNOSIS — Z791 Long term (current) use of non-steroidal anti-inflammatories (NSAID): Secondary | ICD-10-CM | POA: Diagnosis not present

## 2023-11-22 DIAGNOSIS — E876 Hypokalemia: Secondary | ICD-10-CM | POA: Diagnosis present

## 2023-11-22 DIAGNOSIS — E860 Dehydration: Secondary | ICD-10-CM | POA: Diagnosis present

## 2023-11-22 DIAGNOSIS — Z28310 Unvaccinated for covid-19: Secondary | ICD-10-CM | POA: Diagnosis not present

## 2023-11-22 DIAGNOSIS — U071 COVID-19: Secondary | ICD-10-CM | POA: Diagnosis present

## 2023-11-22 DIAGNOSIS — E861 Hypovolemia: Secondary | ICD-10-CM | POA: Diagnosis present

## 2023-11-22 DIAGNOSIS — R627 Adult failure to thrive: Secondary | ICD-10-CM | POA: Diagnosis present

## 2023-11-22 DIAGNOSIS — E871 Hypo-osmolality and hyponatremia: Secondary | ICD-10-CM | POA: Diagnosis present

## 2023-11-22 DIAGNOSIS — I5032 Chronic diastolic (congestive) heart failure: Secondary | ICD-10-CM | POA: Diagnosis present

## 2023-11-22 DIAGNOSIS — Z79899 Other long term (current) drug therapy: Secondary | ICD-10-CM | POA: Diagnosis not present

## 2023-11-22 DIAGNOSIS — I110 Hypertensive heart disease with heart failure: Secondary | ICD-10-CM | POA: Diagnosis present

## 2023-11-22 DIAGNOSIS — Z6824 Body mass index (BMI) 24.0-24.9, adult: Secondary | ICD-10-CM | POA: Diagnosis not present

## 2023-11-22 DIAGNOSIS — F3340 Major depressive disorder, recurrent, in remission, unspecified: Secondary | ICD-10-CM | POA: Diagnosis present

## 2023-11-22 DIAGNOSIS — E785 Hyperlipidemia, unspecified: Secondary | ICD-10-CM | POA: Diagnosis present

## 2023-11-22 DIAGNOSIS — K219 Gastro-esophageal reflux disease without esophagitis: Secondary | ICD-10-CM | POA: Diagnosis present

## 2023-11-22 DIAGNOSIS — K5190 Ulcerative colitis, unspecified, without complications: Secondary | ICD-10-CM | POA: Diagnosis present

## 2023-11-22 DIAGNOSIS — F419 Anxiety disorder, unspecified: Secondary | ICD-10-CM | POA: Diagnosis present

## 2023-11-22 LAB — CBC
HCT: 29.4 % — ABNORMAL LOW (ref 36.0–46.0)
Hemoglobin: 9.6 g/dL — ABNORMAL LOW (ref 12.0–15.0)
MCH: 27.9 pg (ref 26.0–34.0)
MCHC: 32.7 g/dL (ref 30.0–36.0)
MCV: 85.5 fL (ref 80.0–100.0)
Platelets: 158 10*3/uL (ref 150–400)
RBC: 3.44 MIL/uL — ABNORMAL LOW (ref 3.87–5.11)
RDW: 13.6 % (ref 11.5–15.5)
WBC: 5.4 10*3/uL (ref 4.0–10.5)
nRBC: 0 % (ref 0.0–0.2)

## 2023-11-22 LAB — COMPREHENSIVE METABOLIC PANEL
ALT: 18 U/L (ref 0–44)
AST: 20 U/L (ref 15–41)
Albumin: 3 g/dL — ABNORMAL LOW (ref 3.5–5.0)
Alkaline Phosphatase: 71 U/L (ref 38–126)
Anion gap: 7 (ref 5–15)
BUN: 9 mg/dL (ref 8–23)
CO2: 23 mmol/L (ref 22–32)
Calcium: 7.8 mg/dL — ABNORMAL LOW (ref 8.9–10.3)
Chloride: 102 mmol/L (ref 98–111)
Creatinine, Ser: 0.85 mg/dL (ref 0.44–1.00)
GFR, Estimated: >60 mL/min (ref >60)
Glucose, Bld: 118 mg/dL — ABNORMAL HIGH (ref 70–99)
Potassium: 3.8 mmol/L (ref 3.5–5.1)
Sodium: 132 mmol/L — ABNORMAL LOW (ref 135–145)
Total Bilirubin: 0.5 mg/dL (ref 0.0–1.2)
Total Protein: 5.4 g/dL — ABNORMAL LOW (ref 6.5–8.1)

## 2023-11-22 MED ORDER — IPRATROPIUM-ALBUTEROL 0.5-2.5 (3) MG/3ML IN SOLN
3.0000 mL | Freq: Two times a day (BID) | RESPIRATORY_TRACT | Status: DC
  Filled 2023-11-22 (×2): qty 3

## 2023-11-22 MED ORDER — ENOXAPARIN SODIUM 40 MG/0.4ML IJ SOSY
40.0000 mg | PREFILLED_SYRINGE | Freq: Every day | INTRAMUSCULAR | Status: DC
Start: 2023-11-22 — End: 2023-11-23
  Administered 2023-11-22 – 2023-11-23 (×2): 40 mg via SUBCUTANEOUS
  Filled 2023-11-22 (×2): qty 0.4

## 2023-11-22 NOTE — Progress Notes (Addendum)
PROGRESS NOTE Allison Pierce  YNW:[id number] DOB: 19-Apr-1946 DOA: 11/21/2023 PCP: Lupita Raider, MD  Brief Narrative/Hospital Course: 61 yof w/ hx of anxiety/depression, C. difficile diarrhea,GERD, hypertension, hyperlipidemia, left breast cancer, ulcerative colitis, human papilloma virus- vulvar lesion who had upper respiratory symptoms-body aches/cough/fatigue congestion headache sicne 11/18/23 and seen at the urgent care and diagnosed with COVID on 2/18-discharged on Paxlovid, prednisone presented to the ED 2/19 with worsening fatigue. In the ED: Vitals are stable, not hypoxic, low-grade temp 100.5, labs with hyponatremia hypokalemia metabolic acidosis, anemia 9.9 g UA unremarkable.  Chest x-ray Limited bilateral lower lung zones but no evidence of acute chest disease.  Patient has not been able to perform ADLs at home.  Patient was given IV fluids potassium chloride and admitted for further management.   Subjective: Patient seen and examined Stable sleeping stronger today able to use walker and go to the restroom this morning.  Husband at the bedside Having some cough denies shortness of breath chest pain or leg swelling Overnight had low-grade temp 100.5, BP 140s-170s, saturating 96% on room air  Assessment and Plan: Principal Problem:   Hyponatremia Active Problems:   Chronic diastolic CHF (congestive heart failure) (HCC)   Essential hypertension   Gastroesophageal reflux disease   Malignant neoplasm of upper-outer quadrant of left breast in female, estrogen receptor negative (HCC)   Recurrent major depression in remission (HCC)   Hyperlipidemia, unspecified   Hypokalemia   Mild protein malnutrition (HCC)   Normocytic anemia   COVID-19 virus infection  COVID-19 virus infection: Patient has been unvaccinated, managing without pneumonia and not hypoxic.  Symptoms onset 2/16 and on Paxlovid and prednisone since 2/18 will continue the same continue symptomatic management, add  DVT prophylaxis.   Hyponatremia Dehydration Metabolic acidosis Hypokalemia: Patient presentation likely in the setting of COVID infection with failure to thrive.  Received bolus IV fluids in the ED setting improving, potassium improved, encourage oral intake, continue supportive care Recent Labs  Lab 11/21/23 0403 11/22/23 0113  K 3.2* 3.8  CALCIUM 8.3* 7.8*  MG 1.8  --   PHOS 3.1  --     Failure to thrive Deconditioning/debility: Due to COVID unable to perform ADL, obtain PT OT evaluation  Chronic diastolic CHF Essential hypertension: Somewhat hypovolemic on admission.BP borderline, continue losartan.  Fluid status.  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  Patient currently getting [name] every 4 weeks last doe [place].  HLD Statin on hold  Anxiety/depression: Continue home bupropion 2000 and venlafaxine  DVT prophylaxis: enoxaparin (LOVENOX) injection 40 mg Start: 11/22/23 1000 SCDs Start: 11/21/23 0750 Code Status:   Code Status: Full Code Family Communication: plan of care discussed with patient at bedside. Patient status is: Remains hospitalized because of severity of illness Level of care: Progressive   Dispo: The patient is from: Home            Anticipated disposition: TBD.  Obtain PT OT today. Objective: Vitals last 24 hrs: Vitals:   11/22/23 0307 11/22/23 0750 11/22/23 0833 11/22/23 0840  BP: (!) 144/68 (!) 171/82    Pulse: 90 88    Resp: 15 18    Temp: 98.8 F (37.1 C) 99.9 F (37.7 C)    TempSrc: Oral Oral    SpO2: 91% 96% 96% 96%  Weight:      Height:       Weight change:   Physical Examination:  General exam: alert awake,at baseline, older than stated age HEENT:Oral mucosa moist, Ear/Nose WNL grossly Respiratory system: Bilaterally clear BS,no use of accessory muscle Cardiovascular system: S1 & S2 +, No  JVD. Gastrointestinal system: Abdomen soft,NT,ND, BS+ Nervous System: Alert, awake, moving all extremities,and following commands. Extremities: LE edema neg,distal peripheral pulses palpable and warm.  Skin: No rashes,no icterus. MSK: Normal muscle bulk,tone, power   Medications reviewed:  Scheduled Meds:  buPROPion  300 mg Oral Daily   Chlorhexidine Gluconate Cloth  6 each Topical Daily   clonazePAM  1 mg Oral QHS   diphenhydrAMINE  25 mg Oral Daily   enoxaparin (LOVENOX) injection  40 mg Subcutaneous Daily   gabapentin  100 mg Oral QHS   ipratropium-albuterol  3 mL Nebulization BID   losartan  100 mg Oral Daily   nirmatrelvir/ritonavir  3 tablet Oral BID   predniSONE  10 mg Oral BID WC   traZODone  225 mg Oral QHS   venlafaxine XR  75 mg Oral Q breakfast   Continuous Infusions:    Diet Order             Diet regular Room service appropriate? Yes; Fluid consistency: Thin; Fluid restriction: 1200 mL Fluid  Diet effective now                  Intake/Output Summary (Last 24 hours) at 11/22/2023 1102 Last data filed at 11/22/2023 1000 Gross per 24 hour  Intake 1338.56 ml  Output --  Net 1338.56 ml   Net IO Since Admission: 1,338.56 mL [11/22/23 1102]  Wt Readings from Last 3 Encounters:  11/21/23 68 kg  11/05/23 70.5 kg  10/15/23 68.7 kg     Unresulted Labs (From admission, onward)    None     Data Reviewed: I have personally reviewed following labs and imaging studies CBC: Recent Labs  Lab 11/21/23 0403 11/22/23 0113  WBC 4.8 5.4  NEUTROABS 4.2  --   HGB 9.9* 9.6*  HCT 30.3* 29.4*  MCV 85.4 85.5  PLT 171 158   Basic Metabolic Panel:  Recent Labs  Lab 11/21/23 0403 11/22/23 0113  NA 126* 132*  K 3.2* 3.8  CL 95* 102  CO2 21* 23  GLUCOSE 178* 118*  BUN 12 9  CREATININE 0.97 0.85  CALCIUM 8.3* 7.8*  MG 1.8  --   PHOS 3.1  --    GFR: Estimated Creatinine Clearance: 51.1 mL/min (by C-G formula based on SCr of 0.85 mg/dL). Liver Function  Tests:  Recent Labs  Lab 11/21/23 0403 11/22/23 0113  AST 23 20  ALT 18 18  ALKPHOS 66 71  BILITOT 0.4 0.5  PROT 6.1* 5.4*  ALBUMIN 3.4* 3.0*  Sepsis Labs: No results for input(s): "PROCALCITON", "LATICACIDVEN" in the last 168 hours. No results found for this or any previous visit (from the past 240 hours).  Antimicrobials/Microbiology: Anti-infectives (From admission, onward)    Start     Dose/Rate Route Frequency Ordered Stop   11/21/23 2200  nirmatrelvir/ritonavir (renal dosing) (PAXLOVID) 2 tablet  Status:  Discontinued        2 tablet Oral 2 times daily 11/21/23 1806 11/21/23 1814   11/21/23 2200  nirmatrelvir/ritonavir (PAXLOVID) 3 tablet        3 tablet Oral 2 times daily 11/21/23 1814 11/26/23 2159         Component Value Date/Time   SDES  05/12/2022 0240    BLOOD RIGHT ANTECUBITAL Performed at [facility], [address] [place], [place] [address]    SPECREQUEST  05/12/2022 0240    BOTTLES DRAWN AEROBIC AND ANAEROBIC Blood Culture results may not be optimal due to an excessive volume of blood received in culture bottles Performed at [facility], [address], [place], [place] [address]    CULT  05/12/2022 0240    NO GROWTH 5 DAYS Performed at [facility], [address] [place], [place] [address]    REPTSTATUS 05/17/2022 FINAL 05/12/2022 0240     Radiology Studies: DG Chest Portable 1 View Result Date: 11/21/2023 CLINICAL DATA:  Cough and fatigue. EXAM: PORTABLE CHEST 1 VIEW COMPARISON:  Chest CT with contrast 09/17/2023. FINDINGS: The heart size and mediastinal contours are within normal limits. The lungs are expiratory with limited view of the lower lung zones. The visualized lungs are clear of infiltrates. There are left axillary surgical clips. Breast implants are partially visible. Right chest port with IJ approach catheter terminating in the mid SVC. The visualized skeletal structures are unremarkable.  IMPRESSION: Expiratory chest with limited view of the lower lung zones. No evidence of acute chest disease. Electronically Signed   By: Almira Bar M.D.   On: 11/21/2023 06:11   LOS: 0 days   Total time spent in review of labs and imaging, patient evaluation, formulation of plan, documentation and communication with family: 50 minutes  Lanae Boast, MD  [facility]  11/22/2023, 11:02 AM

## 2023-11-22 NOTE — Hospital Course (Addendum)
25 yof w/ hx of anxiety/depression, C. difficile diarrhea,GERD, hypertension, hyperlipidemia, left breast cancer, ulcerative colitis, human papilloma virus- vulvar lesion who had upper respiratory symptoms-body aches/cough/fatigue congestion headache sicne 11/18/23 and seen at the urgent care and diagnosed with COVID on 2/18-discharged on Paxlovid, prednisone presented to the ED 2/19 with worsening fatigue. In the ED: Vitals are stable, not hypoxic, low-grade temp 100.5, labs with hyponatremia hypokalemia metabolic acidosis, anemia 9.9 g UA unremarkable.  Chest x-ray Limited bilateral lower lung zones but no evidence of acute chest disease.  Patient has not been able to perform ADLs at home.  Patient was given IV fluids potassium chloride and admitted for further management. Patient had electrolyte imbalance hypovolemia that resolved with IV fluid hydration.  Send deconditioning weakness due to her COVID infection but not hypoxic, seen by PT OT and doing fairly well no need for home health but will need a rolling walker.  At this time patient is clinically improved and stable for discharge home

## 2023-11-22 NOTE — Progress Notes (Signed)
   11/22/23 0925  TOC Brief Assessment  Insurance and Status Reviewed  Patient has primary care physician Yes  Home environment has been reviewed Resides in single family home with spouse  Prior level of function: Independent with ADLs at baseline  Prior/Current Home Services No current home services  Social Drivers of Health Review SDOH reviewed no interventions necessary  Readmission risk has been reviewed Yes  Transition of care needs no transition of care needs at this time

## 2023-11-22 NOTE — Care Management Obs Status (Signed)
MEDICARE OBSERVATION STATUS NOTIFICATION   Patient Details  Name: Isabela Nardelli MRN: [id number] Date of Birth: [birth year]   Medicare Observation Status Notification Given:  Yes    Amada Jupiter, LCSW 11/22/2023, 10:12 AM

## 2023-11-22 NOTE — Plan of Care (Signed)

## 2023-11-23 ENCOUNTER — Telehealth: Payer: Self-pay | Admitting: Hematology and Oncology

## 2023-11-23 ENCOUNTER — Other Ambulatory Visit: Payer: Self-pay

## 2023-11-23 ENCOUNTER — Other Ambulatory Visit: Payer: Self-pay | Admitting: *Deleted

## 2023-11-23 ENCOUNTER — Other Ambulatory Visit (HOSPITAL_COMMUNITY): Payer: Self-pay

## 2023-11-23 ENCOUNTER — Encounter: Payer: Self-pay | Admitting: Hematology and Oncology

## 2023-11-23 ENCOUNTER — Telehealth: Payer: Self-pay | Admitting: *Deleted

## 2023-11-23 DIAGNOSIS — E871 Hypo-osmolality and hyponatremia: Secondary | ICD-10-CM | POA: Diagnosis not present

## 2023-11-23 DIAGNOSIS — Z171 Estrogen receptor negative status [ER-]: Secondary | ICD-10-CM

## 2023-11-23 MED ORDER — FT TUSSIN DM ADULT 20-200 MG/20ML PO LIQD
5.0000 mL | ORAL | 0 refills | Status: DC | PRN
  Filled 2023-11-23: qty 118, 7d supply, fill #0
  Filled 2023-11-23: qty 118, 4d supply, fill #0

## 2023-11-23 MED ORDER — HEPARIN SOD (PORK) LOCK FLUSH 100 UNIT/ML IV SOLN
500.0000 [IU] | INTRAVENOUS | Status: AC | PRN
  Administered 2023-11-23: 500 [IU]
  Filled 2023-11-23: qty 5

## 2023-11-23 NOTE — Evaluation (Signed)
Occupational Therapy Evaluation Patient Details Name: Allison Pierce MRN: [id number] DOB: 10-22-1945 Today's Date: 11/23/2023   History of Present Illness   Pt presents with URI symptoms and fatigue. Pt Covid + PMH includesa anxiety, C. difficile diarrhea, depression, GERD, hypertension, hyperlipidemia, left breast cancer, ulcerative colitis, UTI, human papilloma virus, vulvar lesion.     Clinical Impressions Pt is at Mod I level with ADLs and ADL mobility. PTA pt lives at home with her husband and was Ind - Mod I with all selfcare and mobility tasks, home mgt and drives. All education completed and no further acute OT services are indicated at this time. Pt to d/c home todayOT will sign off     If plan is discharge home, recommend the following:   Assistance with cooking/housework;Assist for transportation     Functional Status Assessment   Patient has not had a recent decline in their functional status     Equipment Recommendations   None recommended by OT     Recommendations for Other Services         Precautions/Restrictions   Precautions Precautions: Other (comment) Precaution/Restrictions Comments: contact precautions Restrictions Weight Bearing Restrictions Per Provider Order: No     Mobility Bed Mobility Overal bed mobility: Independent                  Transfers Overall transfer level: Modified independent                        Balance Overall balance assessment: Needs assistance Sitting-balance support: Feet supported, No upper extremity supported Sitting balance-Leahy Scale: Good     Standing balance support: During functional activity, Reliant on assistive device for balance Standing balance-Leahy Scale: Fair                             ADL either performed or assessed with clinical judgement   ADL Overall ADL's : Modified independent;At baseline                                              Vision Ability to See in Adequate Light: 0 Adequate Patient Visual Report: No change from baseline       Perception         Praxis         Pertinent Vitals/Pain Pain Assessment Pain Assessment: No/denies pain     Extremity/Trunk Assessment Upper Extremity Assessment Upper Extremity Assessment: Overall WFL for tasks assessed   Lower Extremity Assessment Lower Extremity Assessment: Defer to PT evaluation   Cervical / Trunk Assessment Cervical / Trunk Assessment: Normal   Communication Communication Communication: No apparent difficulties   Cognition Arousal: Alert Behavior During Therapy: WFL for tasks assessed/performed Cognition: No apparent impairments                               Following commands: Intact       Cueing  General Comments          Exercises     Shoulder Instructions      Home Living Family/patient expects to be discharged to:: Private residence Living Arrangements: Spouse/significant other Available Help at Discharge: Family Type of Home: House Home Access: Stairs to enter [employer] of Steps: 4 Entrance Stairs-Rails:  Can reach both Home Layout: Two level;Able to live on main level with bedroom/bathroom     Bathroom Shower/Tub: Tub/shower unit;Walk-in shower   Bathroom Toilet: Standard     Home Equipment: Agricultural consultant (2 wheels)          Prior Functioning/Environment Prior Level of Function : Independent/Modified Independent               ADLs Comments: Ind with ADLs/IADLs, home mgt, drives    OT Problem List: Decreased activity tolerance;Impaired balance (sitting and/or standing)   OT Treatment/Interventions:        OT Goals(Current goals can be found in the care plan section)   Acute Rehab OT Goals Patient Stated Goal: go home   OT Frequency:       Co-evaluation              AM-PAC OT "6 Clicks" Daily Activity     Outcome Measure Help from another  person eating meals?: None Help from another person taking care of personal grooming?: None Help from another person toileting, which includes using toliet, bedpan, or urinal?: None Help from another person bathing (including washing, rinsing, drying)?: None Help from another person to put on and taking off regular upper body clothing?: None Help from another person to put on and taking off regular lower body clothing?: None 6 Click Score: 24   End of Session Equipment Utilized During Treatment: Gait belt  Activity Tolerance: Patient tolerated treatment well Patient left: in bed  OT Visit Diagnosis: Unsteadiness on feet (R26.81)                Time: 1610-9604 OT Time Calculation (min): 16 min Charges:  OT General Charges $OT Visit: 1 Visit OT Evaluation $OT Eval Low Complexity: 1 Low    Galen Manila 11/23/2023, 12:41 PM

## 2023-11-23 NOTE — Progress Notes (Signed)
Discharge instructions reviewed with patient. All questions answered. All belongings accounted for. Patient to follow up with MD in  1 weeks. PIV removed.  Waiting for ride and TOC meds to bed. Patient states her "friend" is picking her up and taking her home.

## 2023-11-23 NOTE — Telephone Encounter (Signed)
Rescheduled appointment due to covid+. Called and left VM with appointment details.

## 2023-11-23 NOTE — Telephone Encounter (Signed)
Received call from pt stating she was recently diagnosed with Covid and is now home resting.  Per MD okay to proceed with tx on 12/03/23 and to add lab visit to infusion appt.  Message sent to scheduling regarding Covid bed as well as adding labs.

## 2023-11-23 NOTE — Plan of Care (Signed)

## 2023-11-23 NOTE — Discharge Summary (Signed)
Physician Discharge Summary  Allison Pierce ZOX:[id number] DOB: 20-Feb-1946 DOA: 11/21/2023  PCP: Lupita Raider, MD  Admit date: 11/21/2023 Discharge date: 11/23/2023 Recommendations for Outpatient Follow-up:  Follow up with PCP in 1 weeks-call for appointment Please obtain BMP/CBC in one week  Discharge Dispo: Home Discharge Condition: Stable Code Status:   Code Status: Full Code Diet recommendation:  Diet Order             Diet regular Room service appropriate? Yes; Fluid consistency: Thin; Fluid restriction: 1200 mL Fluid  Diet effective now                    Brief/Interim Summary: 72 yof w/ hx of anxiety/depression, C. difficile diarrhea,GERD, hypertension, hyperlipidemia, left breast cancer, ulcerative colitis, human papilloma virus- vulvar lesion who had upper respiratory symptoms-body aches/cough/fatigue congestion headache sicne 11/18/23 and seen at the urgent care and diagnosed with COVID on 2/18-discharged on Paxlovid, prednisone presented to the ED 2/19 with worsening fatigue. In the ED: Vitals are stable, not hypoxic, low-grade temp 100.5, labs with hyponatremia hypokalemia metabolic acidosis, anemia 9.9 g UA unremarkable.  Chest x-ray Limited bilateral lower lung zones but no evidence of acute chest disease.  Patient has not been able to perform ADLs at home.  Patient was given IV fluids potassium chloride and admitted for further management. Patient had electrolyte imbalance hypovolemia that resolved with IV fluid hydration.  Send deconditioning weakness due to her COVID infection but not hypoxic, seen by PT OT and doing fairly well no need for home health but will need a rolling walker.  At this time patient is clinically improved and stable for discharge home  Discharge Diagnoses:  Principal Problem:   Hyponatremia Active Problems:   Chronic diastolic CHF (congestive heart failure) (HCC)   Essential hypertension   Gastroesophageal reflux disease   Malignant  neoplasm of upper-outer quadrant of left breast in female, estrogen receptor negative (HCC)   Recurrent major depression in remission (HCC)   Hyperlipidemia, unspecified   Hypokalemia   Mild protein malnutrition (HCC)   Normocytic anemia   COVID-19 virus infection  COVID-19 virus infection: Patient has been unvaccinated, managing without pneumonia and not hypoxic.  Symptoms onset 2/16 and on Paxlovid and prednisone since 2/18 and she will complete the course.  Currently not hypoxic weakness has improved, worked well with PT OT   Hyponatremia Dehydration Metabolic acidosis Hypokalemia: Patient presentation likely in the setting of COVID infection with failure to thrive.  Received bolus IV fluids in the ED-Labs improved.    Failure to thrive Deconditioning/debility: Due to COVID and feeling improved now.   Chronic diastolic CHF Essential hypertension: Somewhat hypovolemic on admission.at this time volume status improved continue home medication including losartan.    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  Patient currently getting [name] every 4 weeks last doe early feb [place].  HLD Statin on hold  Anxiety/depression: Continue home bupropion and venlafaxine  Consults: none Subjective: Alert and oriented feels much improved not hypoxic mild cough Agreeable for discharge today  Discharge Exam: Vitals:   11/23/23 0432 11/23/23 0902  BP: (!) 151/60   Pulse: 68 72  Resp: 20   Temp: 97.7 F (36.5 C)   SpO2: 94% 92%   General: Pt is alert, awake, not in acute distress Cardiovascular: RRR, S1/S2 +, no rubs, no gallops Respiratory: CTA bilaterally, no wheezing, no rhonchi Abdominal: Soft, NT,  ND, bowel sounds + Extremities: no edema, no cyanosis  Discharge Instructions  Discharge Instructions     Discharge instructions   Complete by: As directed    Please  call call MD or return to ER for similar or worsening recurring problem that brought you to hospital or if any fever,nausea/vomiting,abdominal pain, uncontrolled pain, chest pain,  shortness of breath or any other alarming symptoms.  Please follow-up your doctor as instructed in a week time and call the office for appointment.  Please avoid alcohol, smoking, or any other illicit substance and maintain healthy habits including taking your regular medications as prescribed.  You were cared for by a hospitalist during your hospital stay. If you have any questions about your discharge medications or the care you received while you were in the hospital after you are discharged, you can call the unit and ask to speak with the hospitalist on call if the hospitalist that took care of you is not available.  Once you are discharged, your primary care physician will handle any further medical issues. Please note that NO REFILLS for any discharge medications will be authorized once you are discharged, as it is imperative that you return to your primary care physician (or establish a relationship with a primary care physician if you do not have one) for your aftercare needs so that they can reassess your need for medications and monitor your lab values   Increase activity slowly   Complete by: As directed       Allergies as of 11/23/2023       Reactions   Oxybutynin Other (See Comments)   Unknown per Pt    Prozac [fluoxetine Hcl] Other (See Comments)   "Could not lift head up or get out of bed"    Ritalin [methylphenidate] Other (See Comments)   Unknown per Pt    Rosuvastatin Other (See Comments)   Unknown per Pt    Lyrica [pregabalin] Anxiety, Other (See Comments)   "Felt weird"        Medication List     PAUSE taking these medications    simvastatin 40 MG tablet Wait to take this until: November 26, 2023 Commonly known as: ZOCOR Take by mouth.       TAKE these medications     buPROPion 300 MG 24 hr tablet Commonly known as: WELLBUTRIN XL Take 300 mg by mouth daily.   clonazePAM 1 MG tablet Commonly known as: KLONOPIN Take 1 mg by mouth at bedtime.   CLONAZEPAM PO Take by mouth.   diphenhydrAMINE 25 MG tablet Commonly known as: BENADRYL Take 25 mg by mouth in the morning.   fluticasone 50 MCG/ACT nasal spray Commonly known as: FLONASE Place 1 spray into both nostrils daily as needed for allergies.   gabapentin 100 MG capsule Commonly known as: NEURONTIN Take 1 capsule (100 mg total) by mouth at bedtime.   guaiFENesin-dextromethorphan 100-10 MG/5ML syrup Commonly known as: ROBITUSSIN DM Take 5 mLs by mouth every 4 (four) hours as needed for cough (chest congestion).   lidocaine-prilocaine cream Commonly known as: EMLA Apply 1 Application topically as needed. Apply one application daily as needed to port a cath site.   losartan 100 MG tablet Commonly known as: COZAAR Take 1 tablet (100 mg total) by mouth daily.   meloxicam 15 MG tablet Commonly known as: MOBIC Take 15 mg by mouth daily.   Paxlovid (150/100) 10 x 150 MG & 10 x 100MG  Tbpk Generic drug: nirmatrelvir/ritonavir (renal dosing) Take 2  tablets by mouth 2 (two) times daily for 5 days. Dosage for moderate renal impairment (eGFR >/= 30 to <60 mL/min): 150 mg nirmatrelvir (one 150 mg tablet) with 100 mg ritonavir (one 100 mg tablet), with both tablets taken together twice daily for 5 days. Not recommended if eGFR < 30 mL/min. PAXLOVID is not recommend in patients with severe hepatic impairment (Child-Pugh Class C).   predniSONE 10 MG tablet Commonly known as: DELTASONE Take 1 tablet (10 mg total) by mouth 2 (two) times daily with a meal for 5 days.   promethazine-dextromethorphan 6.25-15 MG/5ML syrup Commonly known as: PROMETHAZINE-DM Take 5 mLs by mouth 4 (four) times daily as needed for cough.   spironolactone 25 MG tablet Commonly known as: ALDACTONE Take 0.5 tablets (12.5 mg  total) by mouth daily.   traZODone 150 MG tablet Commonly known as: DESYREL Take 225 mg by mouth at bedtime.   venlafaxine XR 75 MG 24 hr capsule Commonly known as: EFFEXOR-XR Take 75 mg by mouth daily with breakfast.               Durable Medical Equipment  (From admission, onward)           Start     Ordered   11/23/23 0923  For home use only DME Walker rolling  Once       Question Answer Comment  Walker: With 5 Inch Wheels   Patient needs a walker to treat with the following condition Generalized weakness      11/23/23 1610            Follow-up Information     Lupita Raider, MD Follow up in 1 week(s).   Specialty: Family Medicine Contact information: [address] [place] [address] [phone]                Allergies  Allergen Reactions   Oxybutynin Other (See Comments)    Unknown per Pt    Prozac [Fluoxetine Hcl] Other (See Comments)    "Could not lift head up or get out of bed"    Ritalin [Methylphenidate] Other (See Comments)    Unknown per Pt    Rosuvastatin Other (See Comments)    Unknown per Pt    Lyrica [Pregabalin] Anxiety and Other (See Comments)    "Felt weird"    The results of significant diagnostics from this hospitalization (including imaging, microbiology, ancillary and laboratory) are listed below for reference.    Microbiology: No results found for this or any previous visit (from the past 240 hours).  Procedures/Studies: DG Chest Portable 1 View Result Date: 11/21/2023 CLINICAL DATA:  Cough and fatigue. EXAM: PORTABLE CHEST 1 VIEW COMPARISON:  Chest CT with contrast 09/17/2023. FINDINGS: The heart size and mediastinal contours are within normal limits. The lungs are expiratory with limited view of the lower lung zones. The visualized lungs are clear of infiltrates. There are left axillary surgical clips. Breast implants are partially visible. Right chest port with IJ approach catheter terminating  in the mid SVC. The visualized skeletal structures are unremarkable. IMPRESSION: Expiratory chest with limited view of the lower lung zones. No evidence of acute chest disease. Electronically Signed   By: Almira Bar M.D.   On: 11/21/2023 06:11    Labs: BNP (last 3 results) No results for input(s): "BNP" in the last 8760 hours. Basic Metabolic Panel: Recent Labs  Lab 11/21/23 0403 11/22/23 0113  NA 126* 132*  K 3.2* 3.8  CL 95* 102  CO2 21* 23  GLUCOSE 178* 118*  BUN 12 9  CREATININE 0.97 0.85  CALCIUM 8.3* 7.8*  MG 1.8  --   PHOS 3.1  --    Liver Function Tests: Recent Labs  Lab 11/21/23 0403 11/22/23 0113  AST 23 20  ALT 18 18  ALKPHOS 66 71  BILITOT 0.4 0.5  PROT 6.1* 5.4*  ALBUMIN 3.4* 3.0*   No results for input(s): "LIPASE", "AMYLASE" in the last 168 hours. No results for input(s): "AMMONIA" in the last 168 hours. CBC: Recent Labs  Lab 11/21/23 0403 11/22/23 0113  WBC 4.8 5.4  NEUTROABS 4.2  --   HGB 9.9* 9.6*  HCT 30.3* 29.4*  MCV 85.4 85.5  PLT 171 158  No results for input(s): "VITAMINB12", "FOLATE", "FERRITIN", "TIBC", "IRON", "RETICCTPCT" in the last 72 hours. Urinalysis    Component Value Date/Time   COLORURINE YELLOW 11/21/2023 0547   APPEARANCEUR CLEAR 11/21/2023 0547   LABSPEC 1.016 11/21/2023 0547   PHURINE 6.0 11/21/2023 0547   GLUCOSEU NEGATIVE 11/21/2023 0547   HGBUR NEGATIVE 11/21/2023 0547   BILIRUBINUR NEGATIVE 11/21/2023 0547   KETONESUR NEGATIVE 11/21/2023 0547   PROTEINUR NEGATIVE 11/21/2023 0547   NITRITE NEGATIVE 11/21/2023 0547   LEUKOCYTESUR NEGATIVE 11/21/2023 0547   Sepsis Labs Recent Labs  Lab 11/21/23 0403 11/22/23 0113  WBC 4.8 5.4   Microbiology No results found for this or any previous visit (from the past 240 hours). Time coordinating discharge: 25 minutes  SIGNED: Lanae Boast, MD  [facility] 11/23/2023, 9:23 AM  If 7PM-7AM, please contact night-coverage [email or web address]

## 2023-11-23 NOTE — TOC Transition Note (Signed)
Transition of Care [facility]) - Discharge Note  Patient Details  Name: Allison Pierce MRN: [id number] Date of Birth: 02-13-1946  Transition of [facility] CM/SW Contact:  Ewing Schlein, LCSW Phone Number: 11/23/2023, 10:25 AM  Clinical Narrative: Consult received for rolling walker. CSW spoke with patient regarding DME needs. Patient reported she has a rollator at home she received from a friend and does not want to be set up a rolling walker at this time. TOC signing off.    Final next level of care: Home/Self Care Barriers to Discharge: Barriers Resolved  Patient Goals and CMS Choice Patient states their goals for this hospitalization and ongoing recovery are:: Return home Choice offered to / list presented to : NA  Discharge Plan and Services Additional resources added to the After Visit Summary for         DME Arranged: N/A DME Agency: NA  Social Drivers of Health (SDOH) Interventions SDOH Screenings   Food Insecurity: No Food Insecurity (11/21/2023)  Housing: Unknown (11/21/2023)  Transportation Needs: No Transportation Needs (11/21/2023)  Utilities: Not At Risk (11/21/2023)  Financial Resource Strain: Low Risk  (02/22/2022)  Social Connections: Patient Declined (11/21/2023)  Tobacco Use: Low Risk  (11/21/2023)   Readmission Risk Interventions     No data to display

## 2023-11-23 NOTE — Progress Notes (Signed)
Meds delivered from [facility] outpatient [facility] pharmacy by this RN

## 2023-11-23 NOTE — Evaluation (Signed)
Physical Therapy Evaluation Patient Details Name: Allison Pierce MRN: [id number] DOB: Feb 07, 1946 Today's Date: 11/23/2023  History of Present Illness  Pt presents with URI symptoms and fatigue. Pt Covid + PMH includesa anxiety, C. difficile diarrhea, depression, GERD, hypertension, hyperlipidemia, left breast cancer, ulcerative colitis, UTI, human papilloma virus, vulvar lesion.  Clinical Impression  Pt presents with general fatigue and decreased activity tolerance secondary to the above diagnosis. Pt demonstrated some decreased balance requiring the RW for stability. Feel pt is safe to d/c home with RW. Pt educated on HEP to maintain mobility and strength. Pt lives with her husband who unfortunately is feeling not well possible covid +. Pt will benefit from continued acute PT until d/c home.       If plan is discharge home, recommend the following: Assistance with cooking/housework;Help with stairs or ramp for entrance;A little help with walking and/or transfers   Can travel by private vehicle        Equipment Recommendations None recommended by PT  Recommendations for Other Services       Functional Status Assessment Patient has had a recent decline in their functional status and demonstrates the ability to make significant improvements in function in a reasonable and predictable amount of time.     Precautions / Restrictions Precautions Precautions: Other (comment) Precaution/Restrictions Comments: contact precautions Restrictions Weight Bearing Restrictions Per Provider Order: No      Mobility  Bed Mobility Overal bed mobility: Independent               Patient Response: Cooperative  Transfers Overall transfer level: Modified independent                      Ambulation/Gait Ambulation/Gait assistance: Contact guard assist Gait Distance (Feet): 25 Feet Assistive device: None Gait Pattern/deviations: Step-through pattern, Decreased stride  length Gait velocity: decreased     General Gait Details: a little unstable with gait, pt mod indep with RW and CGA without AD  Stairs            Wheelchair Mobility     Tilt Bed Tilt Bed Patient Response: Cooperative  Modified Rankin (Stroke Patients Only)       Balance Overall balance assessment: Needs assistance   Sitting balance-Leahy Scale: Normal     Standing balance support: Reliant on assistive device for balance Standing balance-Leahy Scale: Fair                               Pertinent Vitals/Pain Pain Assessment Pain Assessment: No/denies pain    Home Living Family/patient expects to be discharged to:: Private residence Living Arrangements: Spouse/significant other Available Help at Discharge: Family Type of Home: House Home Access: Stairs to enter Entrance Stairs-Rails: Can reach both Entrance Stairs-Number of Steps: 4   Home Layout: Two level;Able to live on main level with bedroom/bathroom Home Equipment: Agricultural consultant (2 wheels)      Prior Function Prior Level of Function : Independent/Modified Independent                     Extremity/Trunk Assessment   Upper Extremity Assessment Upper Extremity Assessment: Defer to OT evaluation    Lower Extremity Assessment Lower Extremity Assessment: Overall WFL for tasks assessed    Cervical / Trunk Assessment Cervical / Trunk Assessment: Normal  Communication   Communication Communication: No apparent difficulties    Cognition Arousal: Alert Behavior During Therapy: [facility] for  tasks assessed/performed   PT - Cognitive impairments: No apparent impairments                         Following commands: Intact       Cueing       General Comments      Exercises General Exercises - Lower Extremity Long Arc Quad: AROM, Strengthening, Both, 15 reps, Seated Hip Flexion/Marching: AROM, Strengthening, Both, Seated   Assessment/Plan    PT Assessment Patient  needs continued PT services  PT Problem List Decreased activity tolerance;Decreased balance;Decreased mobility;Cardiopulmonary status limiting activity;Decreased strength       PT Treatment Interventions DME instruction;Gait training;Stair training;Patient/family education;Functional mobility training;Therapeutic activities;Therapeutic exercise;Balance training    PT Goals (Current goals can be found in the Care Plan section)  Acute Rehab PT Goals Patient Stated Goal: To feel better PT Goal Formulation: With patient Time For Goal Achievement: 12/06/23 Potential to Achieve Goals: Good    Frequency Min 1X/week     Co-evaluation               AM-PAC PT "6 Clicks" Mobility  Outcome Measure Help needed turning from your back to your side while in a flat bed without using bedrails?: None Help needed moving from lying on your back to sitting on the side of a flat bed without using bedrails?: None Help needed moving to and from a bed to a chair (including a wheelchair)?: None Help needed standing up from a chair using your arms (e.g., wheelchair or bedside chair)?: None Help needed to walk in hospital room?: A Little Help needed climbing 3-5 steps with a railing? : A Little 6 Click Score: 22    End of Session   Activity Tolerance: Patient tolerated treatment well Patient left: in bed;with call bell/phone within reach Nurse Communication: Mobility status PT Visit Diagnosis: Unsteadiness on feet (R26.81)    Time: 9604-5409 PT Time Calculation (min) (ACUTE ONLY): 21 min   Charges:   PT Evaluation $PT Eval Low Complexity: 1 Low PT Treatments $Therapeutic Exercise: 8-22 mins           [place]anee Mcnee Kerstine 11/23/2023, 9:11 AM

## 2023-11-25 ENCOUNTER — Emergency Department (HOSPITAL_COMMUNITY): Payer: Medicare Other

## 2023-11-25 ENCOUNTER — Other Ambulatory Visit: Payer: Self-pay

## 2023-11-25 ENCOUNTER — Observation Stay (HOSPITAL_COMMUNITY)
Admission: EM | Admit: 2023-11-25 | Discharge: 2023-11-26 | Disposition: A | Discharge status: Home / Self Care | Payer: Medicare Other | Attending: Family Medicine | Admitting: Family Medicine

## 2023-11-25 DIAGNOSIS — E782 Mixed hyperlipidemia: Secondary | ICD-10-CM | POA: Insufficient documentation

## 2023-11-25 DIAGNOSIS — L0390 Cellulitis, unspecified: Secondary | ICD-10-CM | POA: Diagnosis not present

## 2023-11-25 DIAGNOSIS — F1090 Alcohol use, unspecified, uncomplicated: Secondary | ICD-10-CM | POA: Diagnosis not present

## 2023-11-25 DIAGNOSIS — I5032 Chronic diastolic (congestive) heart failure: Secondary | ICD-10-CM | POA: Diagnosis not present

## 2023-11-25 DIAGNOSIS — I959 Hypotension, unspecified: Secondary | ICD-10-CM

## 2023-11-25 DIAGNOSIS — E785 Hyperlipidemia, unspecified: Secondary | ICD-10-CM | POA: Diagnosis present

## 2023-11-25 DIAGNOSIS — E236 Other disorders of pituitary gland: Secondary | ICD-10-CM | POA: Diagnosis present

## 2023-11-25 DIAGNOSIS — C50212 Malignant neoplasm of upper-inner quadrant of left female breast: Secondary | ICD-10-CM | POA: Insufficient documentation

## 2023-11-25 DIAGNOSIS — Z79899 Other long term (current) drug therapy: Secondary | ICD-10-CM | POA: Diagnosis not present

## 2023-11-25 DIAGNOSIS — D649 Anemia, unspecified: Secondary | ICD-10-CM | POA: Diagnosis not present

## 2023-11-25 DIAGNOSIS — I8001 Phlebitis and thrombophlebitis of superficial vessels of right lower extremity: Secondary | ICD-10-CM | POA: Diagnosis not present

## 2023-11-25 DIAGNOSIS — F419 Anxiety disorder, unspecified: Secondary | ICD-10-CM | POA: Diagnosis not present

## 2023-11-25 DIAGNOSIS — A419 Sepsis, unspecified organism: Secondary | ICD-10-CM | POA: Diagnosis not present

## 2023-11-25 DIAGNOSIS — M79601 Pain in right arm: Secondary | ICD-10-CM | POA: Diagnosis not present

## 2023-11-25 DIAGNOSIS — E237 Disorder of pituitary gland, unspecified: Secondary | ICD-10-CM | POA: Insufficient documentation

## 2023-11-25 DIAGNOSIS — N179 Acute kidney failure, unspecified: Secondary | ICD-10-CM | POA: Insufficient documentation

## 2023-11-25 DIAGNOSIS — A4189 Other specified sepsis: Principal | ICD-10-CM | POA: Insufficient documentation

## 2023-11-25 DIAGNOSIS — K219 Gastro-esophageal reflux disease without esophagitis: Secondary | ICD-10-CM | POA: Diagnosis present

## 2023-11-25 DIAGNOSIS — U071 COVID-19: Secondary | ICD-10-CM | POA: Insufficient documentation

## 2023-11-25 DIAGNOSIS — C7951 Secondary malignant neoplasm of bone: Secondary | ICD-10-CM | POA: Insufficient documentation

## 2023-11-25 DIAGNOSIS — I10 Essential (primary) hypertension: Secondary | ICD-10-CM | POA: Diagnosis present

## 2023-11-25 DIAGNOSIS — R531 Weakness: Secondary | ICD-10-CM | POA: Diagnosis not present

## 2023-11-25 DIAGNOSIS — L03113 Cellulitis of right upper limb: Secondary | ICD-10-CM | POA: Diagnosis not present

## 2023-11-25 DIAGNOSIS — F32A Depression, unspecified: Secondary | ICD-10-CM

## 2023-11-25 DIAGNOSIS — Z4789 Encounter for other orthopedic aftercare: Secondary | ICD-10-CM | POA: Diagnosis not present

## 2023-11-25 DIAGNOSIS — I110 Hypertensive heart disease with heart failure: Secondary | ICD-10-CM | POA: Diagnosis not present

## 2023-11-25 DIAGNOSIS — R5383 Other fatigue: Secondary | ICD-10-CM | POA: Diagnosis present

## 2023-11-25 LAB — COMPREHENSIVE METABOLIC PANEL
ALT: 27 U/L (ref 0–44)
AST: 23 U/L (ref 15–41)
Albumin: 3.4 g/dL — ABNORMAL LOW (ref 3.5–5.0)
Alkaline Phosphatase: 59 U/L (ref 38–126)
Anion gap: 8 (ref 5–15)
BUN: 14 mg/dL (ref 8–23)
CO2: 28 mmol/L (ref 22–32)
Calcium: 9 mg/dL (ref 8.9–10.3)
Chloride: 98 mmol/L (ref 98–111)
Creatinine, Ser: 1.24 mg/dL — ABNORMAL HIGH (ref 0.44–1.00)
GFR, Estimated: 45 mL/min — ABNORMAL LOW (ref >60)
Glucose, Bld: 118 mg/dL — ABNORMAL HIGH (ref 70–99)
Potassium: 4 mmol/L (ref 3.5–5.1)
Sodium: 134 mmol/L — ABNORMAL LOW (ref 135–145)
Total Bilirubin: 0.7 mg/dL (ref 0.0–1.2)
Total Protein: 6.1 g/dL — ABNORMAL LOW (ref 6.5–8.1)

## 2023-11-25 LAB — URINALYSIS, W/ REFLEX TO CULTURE (INFECTION SUSPECTED)
Bacteria, UA: NONE SEEN
Bilirubin Urine: NEGATIVE
Glucose, UA: NEGATIVE mg/dL
Hgb urine dipstick: NEGATIVE
Ketones, ur: NEGATIVE mg/dL
Leukocytes,Ua: NEGATIVE
Nitrite: NEGATIVE
Protein, ur: NEGATIVE mg/dL
Specific Gravity, Urine: 1.005 (ref 1.005–1.030)
pH: 7 (ref 5.0–8.0)

## 2023-11-25 LAB — CBC WITH DIFFERENTIAL/PLATELET
Abs Immature Granulocytes: 0.03 10*3/uL (ref 0.00–0.07)
Basophils Absolute: 0 10*3/uL (ref 0.0–0.1)
Basophils Relative: 0 %
Eosinophils Absolute: 0.1 10*3/uL (ref 0.0–0.5)
Eosinophils Relative: 1 %
HCT: 32.8 % — ABNORMAL LOW (ref 36.0–46.0)
Hemoglobin: 10.6 g/dL — ABNORMAL LOW (ref 12.0–15.0)
Immature Granulocytes: 0 %
Lymphocytes Relative: 8 %
Lymphs Abs: 0.7 10*3/uL (ref 0.7–4.0)
MCH: 28 pg (ref 26.0–34.0)
MCHC: 32.3 g/dL (ref 30.0–36.0)
MCV: 86.5 fL (ref 80.0–100.0)
Monocytes Absolute: 0.9 10*3/uL (ref 0.1–1.0)
Monocytes Relative: 11 %
Neutro Abs: 6.5 10*3/uL (ref 1.7–7.7)
Neutrophils Relative %: 80 %
Platelets: 241 10*3/uL (ref 150–400)
RBC: 3.79 MIL/uL — ABNORMAL LOW (ref 3.87–5.11)
RDW: 13.5 % (ref 11.5–15.5)
WBC: 8.2 10*3/uL (ref 4.0–10.5)
nRBC: 0 % (ref 0.0–0.2)

## 2023-11-25 LAB — CBG MONITORING, ED: Glucose-Capillary: 128 mg/dL — ABNORMAL HIGH (ref 70–99)

## 2023-11-25 LAB — MAGNESIUM: Magnesium: 2 mg/dL (ref 1.7–2.4)

## 2023-11-25 LAB — TSH: TSH: 1.121 u[IU]/mL (ref 0.350–4.500)

## 2023-11-25 LAB — PROTIME-INR
INR: 1 (ref 0.8–1.2)
Prothrombin Time: 13.4 s (ref 11.4–15.2)

## 2023-11-25 LAB — I-STAT CG4 LACTIC ACID, ED
Lactic Acid, Venous: 2.6 mmol/L (ref 0.5–1.9)
Lactic Acid, Venous: 1.9 mmol/L (ref 0.5–1.9)

## 2023-11-25 LAB — PROCALCITONIN: Procalcitonin: <0.1 ng/mL

## 2023-11-25 LAB — VITAMIN B12: Vitamin B-12: 1200 pg/mL — ABNORMAL HIGH (ref 180–914)

## 2023-11-25 LAB — APTT: aPTT: 27 s (ref 24–36)

## 2023-11-25 MED ORDER — BUPROPION HCL ER (XL) 300 MG PO TB24
300.0000 mg | ORAL_TABLET | Freq: Every morning | ORAL | Status: DC
  Administered 2023-11-26: 300 mg via ORAL
  Filled 2023-11-25: qty 1

## 2023-11-25 MED ORDER — TRAZODONE HCL 50 MG PO TABS
225.0000 mg | ORAL_TABLET | Freq: Every day | ORAL | Status: DC
  Administered 2023-11-25: 225 mg via ORAL
  Filled 2023-11-25 (×2): qty 1

## 2023-11-25 MED ORDER — CHLORHEXIDINE GLUCONATE CLOTH 2 % EX PADS
6.0000 | MEDICATED_PAD | Freq: Every day | CUTANEOUS | Status: DC
  Administered 2023-11-25 – 2023-11-26 (×2): 6 via TOPICAL

## 2023-11-25 MED ORDER — ONDANSETRON HCL 4 MG/2ML IJ SOLN: 4.0000 mg | Freq: Four times a day (QID) | INTRAMUSCULAR | Status: DC | PRN

## 2023-11-25 MED ORDER — SODIUM CHLORIDE 0.9 % IV SOLN
1.0000 g | INTRAVENOUS | Status: DC
  Administered 2023-11-25: 1 g via INTRAVENOUS
  Filled 2023-11-25: qty 10

## 2023-11-25 MED ORDER — ACETAMINOPHEN 325 MG PO TABS: 650.0000 mg | ORAL_TABLET | Freq: Four times a day (QID) | ORAL | Status: DC | PRN

## 2023-11-25 MED ORDER — VANCOMYCIN HCL IN DEXTROSE 1-5 GM/200ML-% IV SOLN
1000.0000 mg | Freq: Once | INTRAVENOUS | Status: AC
  Administered 2023-11-25: 1000 mg via INTRAVENOUS
  Filled 2023-11-25: qty 200

## 2023-11-25 MED ORDER — ACETAMINOPHEN 650 MG RE SUPP: 650.0000 mg | Freq: Four times a day (QID) | RECTAL | Status: DC | PRN

## 2023-11-25 MED ORDER — DIPHENHYDRAMINE HCL 25 MG PO TABS
25.0000 mg | ORAL_TABLET | Freq: Two times a day (BID) | ORAL | Status: DC
  Administered 2023-11-25: 25 mg via ORAL
  Filled 2023-11-25 (×3): qty 1

## 2023-11-25 MED ORDER — LACTATED RINGERS IV BOLUS
1000.0000 mL | Freq: Once | INTRAVENOUS | Status: AC
  Administered 2023-11-25: 1000 mL via INTRAVENOUS

## 2023-11-25 MED ORDER — SODIUM CHLORIDE 0.9% FLUSH: 10.0000 mL | INTRAVENOUS | Status: DC | PRN

## 2023-11-25 MED ORDER — METRONIDAZOLE 500 MG/100ML IV SOLN
500.0000 mg | Freq: Once | INTRAVENOUS | Status: AC
  Administered 2023-11-25: 500 mg via INTRAVENOUS
  Filled 2023-11-25: qty 100

## 2023-11-25 MED ORDER — SODIUM CHLORIDE 0.9 % IV BOLUS
1000.0000 mL | Freq: Once | INTRAVENOUS | Status: AC
  Administered 2023-11-25: 1000 mL via INTRAVENOUS

## 2023-11-25 MED ORDER — GABAPENTIN 100 MG PO CAPS
100.0000 mg | ORAL_CAPSULE | Freq: Every day | ORAL | Status: DC
Start: 2023-11-25 — End: 2023-11-26
  Administered 2023-11-25: 100 mg via ORAL
  Filled 2023-11-25: qty 1

## 2023-11-25 MED ORDER — ENOXAPARIN SODIUM 40 MG/0.4ML IJ SOSY
40.0000 mg | PREFILLED_SYRINGE | INTRAMUSCULAR | Status: DC
  Administered 2023-11-25: 40 mg via SUBCUTANEOUS
  Filled 2023-11-25: qty 0.4

## 2023-11-25 MED ORDER — CLONAZEPAM 1 MG PO TABS
1.0000 mg | ORAL_TABLET | Freq: Every day | ORAL | Status: DC
  Administered 2023-11-25: 1 mg via ORAL
  Filled 2023-11-25: qty 1

## 2023-11-25 MED ORDER — SODIUM CHLORIDE 0.9 % IV SOLN
2.0000 g | Freq: Once | INTRAVENOUS | Status: AC
  Administered 2023-11-25: 2 g via INTRAVENOUS
  Filled 2023-11-25: qty 12.5

## 2023-11-25 MED ORDER — VENLAFAXINE HCL ER 75 MG PO CP24
75.0000 mg | ORAL_CAPSULE | Freq: Every day | ORAL | Status: DC
  Administered 2023-11-26: 75 mg via ORAL
  Filled 2023-11-25: qty 1

## 2023-11-25 MED ORDER — ONDANSETRON HCL 4 MG PO TABS: 4.0000 mg | ORAL_TABLET | Freq: Four times a day (QID) | ORAL | Status: DC | PRN

## 2023-11-25 MED ORDER — LACTATED RINGERS IV SOLN: INTRAVENOUS | Status: AC

## 2023-11-25 MED ORDER — METOPROLOL TARTRATE 5 MG/5ML IV SOLN: 5.0000 mg | Freq: Four times a day (QID) | INTRAVENOUS | Status: DC | PRN

## 2023-11-25 MED ORDER — SIMVASTATIN 40 MG PO TABS: 40.0000 mg | ORAL_TABLET | Freq: Every day | ORAL | Status: DC

## 2023-11-25 NOTE — Assessment & Plan Note (Signed)
 Continue wellbutrin, effexor and trazodone

## 2023-11-25 NOTE — Assessment & Plan Note (Signed)
 Mild AKI with creatinine to 1.24 likely pre renal in setting of hypotension IVF resuscitation with improvement in blood pressures UA normal Goal to keep MAP >65 Hold nephrotoxic drugs Continue IVF Trend renal function  Strict I/O

## 2023-11-25 NOTE — Assessment & Plan Note (Signed)
 follows with Dr. Barbaraann Cao.  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

## 2023-11-25 NOTE — Assessment & Plan Note (Signed)
 Evolemic to dry  Echo 12/24: normal EF, grade 1 DD Continue medical management, but will hold cozaar and aldactone in setting of mild AKI today  Daily weights

## 2023-11-25 NOTE — Assessment & Plan Note (Addendum)
 Patient currently getting [name] (monoclonal Ab) every 4 weeks last dose early feb by Dr Pamelia Hoit.  Follow-up with outpatient oncology.

## 2023-11-25 NOTE — Assessment & Plan Note (Signed)
-  She has had profound weakness that started with covid on 2/16. She was hospitalized for this from 2/19-2/21, but states that she was never back to baseline at time of discharge and continued to get progressively weaker  -likely multifactorial with hypotension, sepsis -she is unsure what medication she took this morning -will continue to treat #1 -check TSH/B12 -have PT/OT eval again  -eating and drinking well

## 2023-11-25 NOTE — H&P (Signed)
 History and Physical    Patient: Allison Pierce WUJ:[id number] DOB: 1946/05/19 DOA: 11/25/2023 DOS: the patient was seen and examined on 11/25/2023 PCP: Lupita Raider, MD  Patient coming from: Home - lives with her husband. Ambulates independently    Chief Complaint: fatigue and right redness/swelling of right arm   HPI: Allison Pierce is a 78 y.o. 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  She noticed her arm started to get red and inflamed when she was leaving the hospital. She states it got more inflamed and harder. This morning she woke up and was extremely weak, confused and unstable when walking. Her husband brought her to ED. On arrival blood pressure systolic was 79.   She has been weaker since she started getting treatment for her breast cancer about 2 years ago, but became acutely fatigued with Covid on 2/16. She just gets extremely fatigue with little effort. She will have to sit down and rest after changing the laundry or doing small tasks.   Admitted 2/19-2/21 here at [facility] for covid with complaints of fatigue. No hypoxia or pneumonia. Completed course of paxlovid and steroids and seen by therapy.   No fever/chills. She states she has eaten and drank well since discharged from the hospital. No N/V/D. No dysuria, shortness of breath, diarrhea.     Denies any fever/chills, vision changes/headaches, chest pain or palpitations, shortness of breath or cough, abdominal pain, N/V/D, dysuria or leg swelling.   She does not smoke or drink alcohol.   ER Course:  vitals: afebrile, bp: 125/97, HR: 79, RR: 18, oxygen: 99%RA Pertinent labs: hgb: 10.6, creatinine: 1.24, lactic acid: 2.6>1.9 CXR: no acute  findings In ED: BC obtained. Given 2L IVF and given vanc/cefepime and flagyl. TRH asked to admit.   Review of Systems: As mentioned in the history of present illness. All other systems reviewed and are negative. Past Medical History:  Diagnosis Date   Anxiety    C. difficile diarrhea    Depression    GERD (gastroesophageal reflux disease) OCCASIONALLY  TAKE TUMS   Human papilloma virus    Hyperlipidemia    Hypertension    left breast ca 12/2021   UC (ulcerative colitis) (HCC)    UTI (urinary tract infection)    Vulvar lesion    Past Surgical History:  Procedure Laterality Date   ABDOMINAL HYSTERECTOMY  1992   W/ SALPINGO-OOPHORECTOMY   ANTERIOR CERVICAL DECOMP/DISCECTOMY FUSION  01-18-2009  DR POOLE   C4  - C6   AUGMENTATION MAMMAPLASTY Bilateral    BREAST BIOPSY Right    BREAST BIOPSY Left 02/10/2022   x2   BREAST ENHANCEMENT SURGERY  2011   BREAST EXCISIONAL BIOPSY Left    BREAST RECONSTRUCTION WITH PLACEMENT OF TISSUE EXPANDER AND FLEX HD (ACELLULAR HYDRATED DERMIS) Left 04/07/2022   Procedure: BREAST RECONSTRUCTION WITH PLACEMENT OF TISSUE EXPANDER AND FLEX HD (ACELLULAR HYDRATED DERMIS);  Surgeon: Allena Napoleon, MD;  Location: [facility] OR;  Service: Plastics;  Laterality: Left;   BREAST SURGERY  02-04-2011  dr Jamey Ripa   EXCISION LEFT BREAST MASS--  CALCIFICATION   EYE SURGERY Bilateral    cataract removal   MASTECTOMY W/ SENTINEL NODE BIOPSY Left 04/07/2022   Procedure: LEFT MASTECTOMY WITH SENTINEL NODE BIOPSY;  Surgeon: Abigail Miyamoto,  MD;  Location: [facility] OR;  Service: General;  Laterality: Left;  LMA   PORTACATH PLACEMENT N/A 04/07/2022   Procedure: PORT-A-CATH INSERTION WITH ULTRASOUND GUIDANCE;  Surgeon: Abigail Miyamoto, MD;  Location: [facility] OR;  Service: General;  Laterality: N/A;   REMOVAL OF TISSUE EXPANDER AND PLACEMENT OF IMPLANT Left 03/16/2023   Procedure: Removal of left breast tissue expander and placement of permanent implant;  Surgeon: [name], MD;   Location: [facility];  Service: Plastics;  Laterality: Left;   VULVAR LESION REMOVAL  09/10/2012   Procedure: VULVAR LESION;  Surgeon: Gretta Cool, MD;  Location: [facility];  Service: Gynecology;  Laterality: N/A;  WIDE EXCISION OF VULVAR LESION   Social History:  reports that she has never smoked. She has never used smokeless tobacco. She reports current alcohol use of about 7.0 standard drinks of alcohol per week. She reports that she does not use drugs.  Allergies  Allergen Reactions   Oxybutynin Other (See Comments)    Unknown per Pt    Prozac [Fluoxetine Hcl] Other (See Comments)    "Could not lift head up or get out of bed"    Ritalin [Methylphenidate] Other (See Comments)    Unknown per Pt    Rosuvastatin Other (See Comments)    Unknown per Pt    Lyrica [Pregabalin] Anxiety and Other (See Comments)    "Felt weird"    Family History  Problem Relation Age of Onset   Lung cancer Mother 32       she smoked   Prostate cancer Brother 4   Breast cancer Cousin        paternal first cousin   Breast cancer Cousin        paternal first cousin   Liver disease Neg Hx    Esophageal cancer Neg Hx    Colon cancer Neg Hx     Prior to Admission medications   Medication Sig Start Date End Date Taking? Authorizing Provider  buPROPion (WELLBUTRIN XL) 300 MG 24 hr tablet Take 300 mg by mouth daily.    [provider]  clonazePAM (KLONOPIN) 1 MG tablet Take 1 mg by mouth at bedtime.    [provider]  CLONAZEPAM PO Take by mouth.    [provider]  diphenhydrAMINE (BENADRYL) 25 MG tablet Take 25 mg by mouth in the morning.    [provider]  fluticasone (FLONASE) 50 MCG/ACT nasal spray Place 1 spray into both nostrils daily as needed for allergies.    [provider]  gabapentin (NEURONTIN) 100 MG capsule Take 1 capsule (100 mg total) by mouth at bedtime. 05/10/23   Walisiewicz, Caroleen Hamman, PA-C   Dextromethorphan-guaiFENesin (FT TUSSIN DM ADULT) 20-200 MG/20ML LIQD Take 5 mLs by mouth every 4 (four) hours as needed (chest congestion). 11/23/23   Lanae Boast, MD  lidocaine-prilocaine (EMLA) cream Apply 1 Application topically as needed. Apply one application daily as needed to port a cath site. 12/12/22   Serena Croissant, MD  losartan (COZAAR) 100 MG tablet Take 1 tablet (100 mg total) by mouth daily. 11/21/23   Bensimhon, Bevelyn Buckles, MD  meloxicam (MOBIC) 15 MG tablet Take 15 mg by mouth daily. 11/15/23   [provider]  nirmatrelvir/ritonavir, renal dosing, (PAXLOVID, 150/100,) 10 x 150 MG & 10 x 100MG  TBPK Take 2 tablets by mouth 2 (two) times daily for 5 days. Dosage for moderate renal impairment (eGFR >/= 30 to <60 mL/min): 150 mg nirmatrelvir (one  150 mg tablet) with 100 mg ritonavir (one 100 mg tablet), with both tablets taken together twice daily for 5 days. Not recommended if eGFR < 30 mL/min. PAXLOVID is not recommend in patients with severe hepatic impairment (Child-Pugh Class C). 11/20/23 11/25/23  Bing Neighbors, NP  predniSONE (DELTASONE) 10 MG tablet Take 1 tablet (10 mg total) by mouth 2 (two) times daily with a meal for 5 days. 11/20/23 11/25/23  Bing Neighbors, NP  promethazine-dextromethorphan (PROMETHAZINE-DM) 6.25-15 MG/5ML syrup Take 5 mLs by mouth 4 (four) times daily as needed for cough. 11/20/23   Bing Neighbors, NP  simvastatin (ZOCOR) 40 MG tablet Take by mouth. Patient not taking: Reported on 11/21/2023    [provider]  spironolactone (ALDACTONE) 25 MG tablet Take 0.5 tablets (12.5 mg total) by mouth daily. 09/17/23   Bensimhon, Bevelyn Buckles, MD  traZODone (DESYREL) 150 MG tablet Take 225 mg by mouth at bedtime.    [provider]  venlafaxine XR (EFFEXOR-XR) 75 MG 24 hr capsule Take 75 mg by mouth daily with breakfast.    [provider]  prochlorperazine (COMPAZINE) 10 MG tablet Take 1 tablet (10 mg total) by mouth every 6 (six)  hours as needed (Nausea or vomiting). Patient not taking: Reported on 05/12/2022 04/27/22 06/07/22  Serena Croissant, MD    Physical Exam: Vitals:   11/25/23 1118 11/25/23 1123  BP: (!) 125/97   Pulse: 79   Resp: 18   Temp: 98 F (36.7 C)   TempSrc: Oral   SpO2: 99%   Weight:  68 kg  Height:  5\' 6"  (1.676 m)   General:  Appears calm and comfortable and is in NAD Eyes:  PERRL, EOMI, normal lids, iris ENT:  grossly normal hearing, lips & tongue, mmm; appropriate dentition Neck:  no LAD, masses or thyromegaly; no carotid bruits Cardiovascular:  RRR, no m/r/g. No LE edema.  Respiratory:   CTA bilaterally with no wheezes/rales/rhonchi.  Normal respiratory effort. Abdomen:  soft, NT, ND, NABS Back:   normal alignment, no CVAT Skin:  no rash or induration seen on limited exam Right antecubital fossa with erythema and warmth and some induration along vasculature  Musculoskeletal:  grossly normal tone BUE/BLE, good ROM, no bony abnormality Lower extremity:  No LE edema.  Limited foot exam with no ulcerations.  2+ distal pulses. Psychiatric:  grossly normal mood and affect, speech fluent and appropriate, AOx3 Neurologic:  CN 2-12 grossly intact, moves all extremities in coordinated fashion, sensation intact   Radiological Exams on Admission: Independently reviewed - see discussion in A/P where applicable  DG Chest Port 1 View Result Date: 11/25/2023 CLINICAL DATA:  Sepsis EXAM: PORTABLE CHEST 1 VIEW COMPARISON:  Chest radiograph dated 11/21/2023 FINDINGS: Lines/tubes: Right chest wall port tip projects over the SVC. Lungs: Well inflated lungs. No focal consolidation. Pleura: No pneumothorax or pleural effusion. Heart/mediastinum: The heart size and mediastinal contours are within normal limits. Bones: Cervical spinal fixation hardware appears intact. Partially imaged bilateral breast implants. Left axillary surgical clips. IMPRESSION: No acute disease. Electronically Signed   By: Agustin Cree M.D.    On: 11/25/2023 13:13    EKG: Independently reviewed.  NSR with rate 72; nonspecific ST changes with no evidence of acute ischemia   Labs on Admission: I have personally reviewed the available labs and imaging studies at the time of the admission.  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 Received broad spectrum abx in ED, change over to rocephin, follow blood cultures and trend PCT -ice packs PRN  -lactic acidosis resolved after bolus of IVF -sepsis physiology resolving at time of admit    Weakness -She has had profound weakness that started with covid on 2/16. She was hospitalized for this from 2/19-2/21, but states that she was never back to baseline at time of discharge and continued to get progressively weaker  -likely multifactorial with hypotension, sepsis -she is unsure what medication she took this morning -will continue to treat #1 -check TSH/B12 -have PT/OT eval again  -eating and drinking well    AKI (acute kidney injury) (HCC) Mild AKI  with creatinine to 1.24 likely pre renal in setting of hypotension IVF resuscitation with improvement in blood pressures UA normal Goal to keep MAP >65 Hold nephrotoxic drugs Continue IVF Trend renal function  Strict I/O   COVID-19 virus infection Symptom onset on 2/16 Hospitalized for fatigue 2/19-2/21 at [facility] No hypoxia or pneumonia Completes course of paxlovid today  Continue with PPE/contact and airborne precautions   Essential hypertension Blood pressure readings of 80 systolic on arrival per EDP although no documentation  Hold cozaar and aldactone for today in setting of hypotension responsive to IVF Check orthostatics  PRN IV metoprolol if needed   Normocytic anemia Stable at baseline Trend   Chronic diastolic CHF (congestive heart failure) (HCC) Evolemic to dry  Echo 12/24: normal EF, grade 1 DD Continue medical management, but will hold cozaar and aldactone in setting of mild AKI today  Daily weights   Anxiety and depression Continue wellbutrin, effexor and trazodone   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  Follow-up with outpatient oncology.   Pituitary mass (HCC) follows with Dr. Barbaraann Cao.  Brain MRI 09/20/2023: 2.6 x 7.5 mm mass of the pituitary: Stable   Mixed hyperlipidemia Continue zocor      Advance Care Planning:   Code Status: Full Code   Consults: PT/OT   DVT Prophylaxis: lovenox   Family Communication: husband at bedside   Severity of Illness: The appropriate patient status for this patient is OBSERVATION. Observation status is judged to be reasonable and necessary in order to provide the required intensity of service to ensure the patient's safety. The patient's presenting symptoms, physical exam findings, and initial radiographic and laboratory data in the context of their medical condition is felt  to place them at decreased risk for further clinical deterioration. Furthermore, it is anticipated that the patient will be  medically stable for discharge from the hospital within 2 midnights of admission.   Author: Orland Mustard, MD 11/25/2023 4:15 PM  For on call review [email or web address].

## 2023-11-25 NOTE — Assessment & Plan Note (Signed)
Continue zocor 

## 2023-11-25 NOTE — Assessment & Plan Note (Signed)
 Stable at baseline Trend

## 2023-11-25 NOTE — ED Provider Notes (Signed)
 Allison Pierce EMERGENCY DEPARTMENT AT [facility] Provider Note   CSN: [id number] Arrival date & time: 11/25/23  1101     History  Chief Complaint  Patient presents with   Arm Swelling    Allison Pierce is a 78 y.o. female.  Patient is a 78 year old female with a history of breast cancer currently undergoing chemotherapy, hypertension, hyperlipidemia, GERD, ulcerative colitis.  She was diagnosed with COVID on February 18.  She took Paxil bid and prednisone.  She was admitted to the hospital on February 19 to February 21 for severe weakness and had associated hyponatremia and hypokalemia.  She was discharged 2 days ago.  She said over the weekend she has had some increased redness around an IV site in her right antecubital space.  She noted a little bit of purulent drainage from the area.  She has not had any noted fevers.  She has been feeling weaker.  She feels lightheaded when she stands up.  She has had ongoing coughing.  She was noted to be hypotensive in triage with blood pressures in the 80s systolic.  She has not had any vomiting or diarrhea.  She has some soreness in her chest with coughing but no other chest pain.  No shortness of breath.       Home Medications Prior to Admission medications   Medication Sig Start Date End Date Taking? Authorizing Provider  buPROPion (WELLBUTRIN XL) 300 MG 24 hr tablet Take 300 mg by mouth daily.    [provider]  clonazePAM (KLONOPIN) 1 MG tablet Take 1 mg by mouth at bedtime.    [provider]  CLONAZEPAM PO Take by mouth.    [provider]  diphenhydrAMINE (BENADRYL) 25 MG tablet Take 25 mg by mouth in the morning.    [provider]  fluticasone (FLONASE) 50 MCG/ACT nasal spray Place 1 spray into both nostrils daily as needed for allergies.    [provider]  gabapentin (NEURONTIN) 100 MG capsule Take 1 capsule (100 mg total) by mouth at bedtime. 05/10/23   Walisiewicz,  Caroleen Hamman, PA-C  Dextromethorphan-guaiFENesin (FT TUSSIN DM ADULT) 20-200 MG/20ML LIQD Take 5 mLs by mouth every 4 (four) hours as needed (chest congestion). 11/23/23   Lanae Boast, MD  lidocaine-prilocaine (EMLA) cream Apply 1 Application topically as needed. Apply one application daily as needed to port a cath site. 12/12/22   Serena Croissant, MD  losartan (COZAAR) 100 MG tablet Take 1 tablet (100 mg total) by mouth daily. 11/21/23   Bensimhon, Bevelyn Buckles, MD  meloxicam (MOBIC) 15 MG tablet Take 15 mg by mouth daily. 11/15/23   [provider]  nirmatrelvir/ritonavir, renal dosing, (PAXLOVID, 150/100,) 10 x 150 MG & 10 x 100MG  TBPK Take 2 tablets by mouth 2 (two) times daily for 5 days. Dosage for moderate renal impairment (eGFR >/= 30 to <60 mL/min): 150 mg nirmatrelvir (one 150 mg tablet) with 100 mg ritonavir (one 100 mg tablet), with both tablets taken together twice daily for 5 days. Not recommended if eGFR < 30 mL/min. PAXLOVID is not recommend in patients with severe hepatic impairment (Child-Pugh Class C). 11/20/23 11/25/23  Bing Neighbors, NP  predniSONE (DELTASONE) 10 MG tablet Take 1 tablet (10 mg total) by mouth 2 (two) times daily with a meal for 5 days. 11/20/23 11/25/23  Bing Neighbors, NP  promethazine-dextromethorphan (PROMETHAZINE-DM) 6.25-15 MG/5ML syrup Take 5 mLs by mouth 4 (four) times daily as needed for cough. 11/20/23  Bing Neighbors, NP  simvastatin (ZOCOR) 40 MG tablet Take by mouth. Patient not taking: Reported on 11/21/2023    [provider]  spironolactone (ALDACTONE) 25 MG tablet Take 0.5 tablets (12.5 mg total) by mouth daily. 09/17/23   Bensimhon, Bevelyn Buckles, MD  traZODone (DESYREL) 150 MG tablet Take 225 mg by mouth at bedtime.    [provider]  venlafaxine XR (EFFEXOR-XR) 75 MG 24 hr capsule Take 75 mg by mouth daily with breakfast.    [provider]  prochlorperazine (COMPAZINE) 10 MG tablet Take 1 tablet (10 mg total) by mouth  every 6 (six) hours as needed (Nausea or vomiting). Patient not taking: Reported on 05/12/2022 04/27/22 06/07/22  Serena Croissant, MD      Allergies    Oxybutynin, Prozac [fluoxetine hcl], Ritalin [methylphenidate], Rosuvastatin, and Lyrica [pregabalin]    Review of Systems   Review of Systems  Constitutional:  Positive for fatigue. Negative for chills, diaphoresis and fever.  HENT:  Positive for congestion. Negative for rhinorrhea and sneezing.   Eyes: Negative.   Respiratory:  Positive for cough. Negative for chest tightness and shortness of breath.   Cardiovascular:  Negative for chest pain and leg swelling.  Gastrointestinal:  Negative for abdominal pain, blood in stool, diarrhea, nausea and vomiting.  Genitourinary:  Negative for difficulty urinating, flank pain, frequency and hematuria.  Musculoskeletal:  Negative for arthralgias and back pain.  Skin:  Positive for color change (Redness around IV site). Negative for rash.  Neurological:  Positive for light-headedness. Negative for speech difficulty, weakness, numbness and headaches.    Physical Exam Updated Vital Signs BP (!) 125/97 (BP Location: Right Arm)   Pulse 79   Temp 98 F (36.7 C) (Oral)   Resp 18   Ht 5\' 6"  (1.676 m)   Wt 68 kg   SpO2 99%   BMI 24.20 kg/m  Physical Exam Constitutional:      Appearance: She is well-developed. She is ill-appearing.  HENT:     Head: Normocephalic and atraumatic.  Eyes:     Pupils: Pupils are equal, round, and reactive to light.  Cardiovascular:     Rate and Rhythm: Normal rate and regular rhythm.     Heart sounds: Normal heart sounds.  Pulmonary:     Effort: Pulmonary effort is normal. No respiratory distress.     Breath sounds: Normal breath sounds. No wheezing or rales.  Chest:     Chest wall: No tenderness.  Abdominal:     General: Bowel sounds are normal.     Palpations: Abdomen is soft.     Tenderness: There is no abdominal tenderness. There is no guarding or rebound.   Musculoskeletal:        General: Normal range of motion.     Cervical back: Normal range of motion and neck supple.     Comments: Redness around the antecubital fossa.  No palpable abscess.  There is a palpable cord at the vein site.  Lymphadenopathy:     Cervical: No cervical adenopathy.  Skin:    General: Skin is warm and dry.     Findings: No rash.  Neurological:     General: No focal deficit present.     Mental Status: She is alert and oriented to person, place, and time.     ED Results / Procedures / Treatments   Labs (all labs ordered are listed, but only abnormal results are displayed) Labs Reviewed  COMPREHENSIVE METABOLIC PANEL - Abnormal; Notable for  the following components:      Result Value   Sodium 134 (*)    Glucose, Bld 118 (*)    Creatinine, Ser 1.24 (*)    Total Protein 6.1 (*)    Albumin 3.4 (*)    GFR, Estimated 45 (*)    All other components within normal limits  CBC WITH DIFFERENTIAL/PLATELET - Abnormal; Notable for the following components:   RBC 3.79 (*)    Hemoglobin 10.6 (*)    HCT 32.8 (*)    All other components within normal limits  I-STAT CG4 LACTIC ACID, ED - Abnormal; Notable for the following components:   Lactic Acid, Venous 2.6 (*)    All other components within normal limits  CBG MONITORING, ED - Abnormal; Notable for the following components:   Glucose-Capillary 128 (*)    All other components within normal limits  CULTURE, BLOOD (ROUTINE X 2)  CULTURE, BLOOD (ROUTINE X 2)  PROTIME-INR  APTT  URINALYSIS, W/ REFLEX TO CULTURE (INFECTION SUSPECTED)  I-STAT CG4 LACTIC ACID, ED    EKG None  Radiology DG Chest Port 1 View Result Date: 11/25/2023 CLINICAL DATA:  Sepsis EXAM: PORTABLE CHEST 1 VIEW COMPARISON:  Chest radiograph dated 11/21/2023 FINDINGS: Lines/tubes: Right chest wall port tip projects over the SVC. Lungs: Well inflated lungs. No focal consolidation. Pleura: No pneumothorax or pleural effusion. Heart/mediastinum: The  heart size and mediastinal contours are within normal limits. Bones: Cervical spinal fixation hardware appears intact. Partially imaged bilateral breast implants. Left axillary surgical clips. IMPRESSION: No acute disease. Electronically Signed   By: Agustin Cree M.D.   On: 11/25/2023 13:13    Procedures Procedures    Medications Ordered in ED Medications  lactated ringers infusion (has no administration in time range)  lactated ringers bolus 1,000 mL (1,000 mLs Intravenous New Bag/Given 11/25/23 1206)  sodium chloride 0.9 % bolus 1,000 mL (1,000 mLs Intravenous New Bag/Given 11/25/23 1351)  ceFEPIme (MAXIPIME) 2 g in sodium chloride 0.9 % 100 mL IVPB (2 g Intravenous New Bag/Given 11/25/23 1350)  metroNIDAZOLE (FLAGYL) IVPB 500 mg (500 mg Intravenous New Bag/Given 11/25/23 1258)  vancomycin (VANCOCIN) IVPB 1000 mg/200 mL premix (1,000 mg Intravenous New Bag/Given 11/25/23 1302)    ED Course/ Medical Decision Making/ A&P                                 Medical Decision Making Risk Prescription drug management. Decision regarding hospitalization.   Patient is a 78 year old female with a history of breast cancer currently undergoing treatment who presents with profound weakness.  She was initially hypotensive on arrival.  Her lactate was elevated at 2.6.  She has some concern for cellulitis of her right arm.  She was started on IV fluids and antibiotics for possible sepsis.  Her white count is normal.  Her lactate cleared after fluids.  Chest x-ray was interpreted by me and confirmed by the radiologist to show no evidence of pneumonia.  She does not have abdominal pain or other concerns for infection.  Her urine does not appear infected.  I spoke with Dr. Artis Flock who will admit the patient for further treatment.  Final Clinical Impression(s) / ED Diagnoses Final diagnoses:  Cellulitis of right upper extremity  Hypotension, unspecified hypotension type    Rx / DC Orders ED Discharge Orders      None         Rolan Bucco, MD 11/25/23 1528

## 2023-11-25 NOTE — Assessment & Plan Note (Signed)
 Blood pressure readings of 80 systolic on arrival per EDP although no documentation  Hold cozaar and aldactone for today in setting of hypotension responsive to IVF Check orthostatics  PRN IV metoprolol if needed

## 2023-11-25 NOTE — Sepsis Progress Note (Signed)
 Following for sepsis monitoring ?

## 2023-11-25 NOTE — ED Triage Notes (Signed)
 Patient to ED by POV with c/o right AC redness and swelling. Patient states she was DC from facility Friday and noticed where her IV was is now swollen, red and hard to touch. Pt was admitted for COVID and reports increased weakness.

## 2023-11-25 NOTE — Assessment & Plan Note (Addendum)
 Symptom onset on 2/16 Hospitalized for fatigue 2/19-2/21 at [facility] No hypoxia or pneumonia Paxlovid course started on 2/18. Should have completed 5 day course yesterday Continue with PPE/contact and airborne precautions

## 2023-11-25 NOTE — Assessment & Plan Note (Signed)
 78 year old presenting to ED with profound weakness and concerns of right arm swelling/redness where previous IV had been found to have systolic blood pressure of 79 and lactic acid of 2.6 and admitted for sepsis secondary to cellulitis -obs to progressive -received 2L IVF in ED with response in pressures and now normo-hypertensive and feels better -UA wnl and CXR clear -right antecubital fossa phlebitis +/-cellulitis. Received broad spectrum abx in ED, change over to rocephin, follow blood cultures and trend PCT -ice packs PRN  -lactic acidosis resolved after bolus of IVF -sepsis physiology resolving at time of admit

## 2023-11-26 ENCOUNTER — Ambulatory Visit: Payer: Medicare Other

## 2023-11-26 ENCOUNTER — Encounter (HOSPITAL_COMMUNITY): Payer: Medicare Other

## 2023-11-26 DIAGNOSIS — A4189 Other specified sepsis: Secondary | ICD-10-CM | POA: Diagnosis not present

## 2023-11-26 DIAGNOSIS — L03113 Cellulitis of right upper limb: Principal | ICD-10-CM

## 2023-11-26 LAB — CBC
HCT: 30.7 % — ABNORMAL LOW (ref 36.0–46.0)
Hemoglobin: 9.6 g/dL — ABNORMAL LOW (ref 12.0–15.0)
MCH: 27.1 pg (ref 26.0–34.0)
MCHC: 31.3 g/dL (ref 30.0–36.0)
MCV: 86.7 fL (ref 80.0–100.0)
Platelets: 224 10*3/uL (ref 150–400)
RBC: 3.54 MIL/uL — ABNORMAL LOW (ref 3.87–5.11)
RDW: 13.4 % (ref 11.5–15.5)
WBC: 6.6 10*3/uL (ref 4.0–10.5)
nRBC: 0 % (ref 0.0–0.2)

## 2023-11-26 LAB — BASIC METABOLIC PANEL
Anion gap: 9 (ref 5–15)
BUN: 11 mg/dL (ref 8–23)
CO2: 24 mmol/L (ref 22–32)
Calcium: 8.5 mg/dL — ABNORMAL LOW (ref 8.9–10.3)
Chloride: 104 mmol/L (ref 98–111)
Creatinine, Ser: 0.83 mg/dL (ref 0.44–1.00)
GFR, Estimated: >60 mL/min (ref >60)
Glucose, Bld: 101 mg/dL — ABNORMAL HIGH (ref 70–99)
Potassium: 3.9 mmol/L (ref 3.5–5.1)
Sodium: 137 mmol/L (ref 135–145)

## 2023-11-26 LAB — PROCALCITONIN: Procalcitonin: <0.1 ng/mL

## 2023-11-26 MED ORDER — CEFADROXIL 500 MG PO CAPS: 500.0000 mg | ORAL_CAPSULE | Freq: Two times a day (BID) | ORAL | 0 refills | Status: AC

## 2023-11-26 MED ORDER — HEPARIN SOD (PORK) LOCK FLUSH 100 UNIT/ML IV SOLN
500.0000 [IU] | INTRAVENOUS | Status: AC | PRN
  Administered 2023-11-26: 500 [IU]

## 2023-11-26 MED ORDER — DIPHENHYDRAMINE HCL 25 MG PO CAPS
25.0000 mg | ORAL_CAPSULE | Freq: Two times a day (BID) | ORAL | Status: DC
  Administered 2023-11-26: 25 mg via ORAL
  Filled 2023-11-26: qty 1

## 2023-11-26 NOTE — Plan of Care (Signed)
 IV team deaccessed and heparinized port Pt given discharge instructions, verbalized understanding Pt transported in wheelchair with all belongings   Problem: Clinical Measurements: Goal: Will remain free from infection Outcome: Completed/Met   Problem: Clinical Measurements: Goal: Respiratory complications will improve Outcome: Completed/Met   Problem: Activity: Goal: Risk for activity intolerance will decrease Outcome: Completed/Met   Problem: Health Behavior/Discharge Planning: Goal: Ability to manage health-related needs will improve Outcome: Completed/Met

## 2023-11-26 NOTE — Discharge Summary (Signed)
 Physician Discharge Summary  Allison Pierce ZOX:[id number] DOB: 1946/04/26 DOA: 11/25/2023  PCP: Lupita Raider, MD  Admit date: 11/25/2023 Discharge date: 11/26/2023  Time spent: 40 minutes  Recommendations for Outpatient Follow-up:  Follow outpatient CBC/CMP  Consider RUE [place] if swelling doesn't improve with antibiotics/supportive care Holding antihypertensives until outpatient follow up given SBP in 80s on presentation   Discharge Diagnoses:  Principal Problem:   Sepsis due to cellulitis [facility]) Active Problems:   Weakness   AKI (acute kidney injury) (HCC)   COVID-19 virus infection   Essential hypertension   Chronic diastolic CHF (congestive heart failure) (HCC)   Normocytic anemia   Anxiety and depression   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  She noticed her arm started to get red and inflamed when she was leaving the hospital. She states it got more inflamed and harder. This morning she woke up and was extremely weak, confused and unstable when walking. Her husband brought her to ED. On arrival blood pressure systolic was 79.   She presented with hypotension.  Concern for sepsis on arrival.  She had RUE erythema and swelling, this is suspected to be superficial thrombophlebitis.  Will discharge with antibiotics, plan to hold   Hospital Course:  Assessment and Plan:  Superficial  Thrombophlebitis Sepsis Ruled Out Patient presented with hypotension, elevated lactic acid.  But no fever, white count, tachycardia, or tachypnea.  Doesn't meet criteria for sepsis.  I believe this was more of Holbert Caples blood pressure issue, unfortunately her triage BP (80's systolic) not clearly documented in the chart.   She's improved today Suspect her RUE swelling represents Hubbert Landrigan superficial thrombophlebitis (upper extremity DVT on ddx, but lower degree of suspicion - we ordered Ziquan Fidel upper extremity ultrasound, but this would not be done until 2/25).  Because of my high degree of suspicion this is superficial thrombophlebitis (low suspicion for DVT), I'm ok with deferring [place] at this time  (discussed with patient).  Return precautions reviewed (return for any worsening swelling, symptoms).  Should follow with PCP outpatient and get [place] if not improving or persistent.  Will discharge with duricef to cover cellulitis.    Weakness -She has had profound weakness that started with covid on 2/16. She was hospitalized for this from 2/19-2/21, but states that she was never back to baseline at time of discharge and continued to get progressively weaker  -recurrent weakness due to hypotension - stable, able to walk the hall without issue   AKI (acute kidney injury) (HCC) Resolved with IVF Hold arb, mra at discharge for now   COVID-19 virus infection Symptom onset on 2/16 Hospitalized for fatigue 2/19-2/21 at [facility] No hypoxia or pneumonia Completed course of paxlovid today  Continue with PPE/contact and airborne precautions    Essential hypertension Blood pressure readings of 80 systolic on arrival per EDP although no documentation  BP's improved today, hold both BP meds at discharge until outpatient follow  up   Normocytic anemia Stable at baseline Trend    Chronic diastolic CHF (congestive heart failure) (HCC) euvolemic Echo 12/24: normal EF, grade 1 DD Daily weights  Holding spironolactone and losartan at  discharge   Anxiety and depression Continue wellbutrin, effexor and trazodone    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  Follow-up with outpatient oncology.    Pituitary mass (HCC) follows with Dr. Barbaraann Cao.  Brain MRI 09/20/2023: 2.6 x 7.5 mm mass of the pituitary: Stable    Mixed hyperlipidemia Continue zocor       Procedures: none   Consultations: none  Discharge Exam: Vitals:   11/26/23 1417 11/26/23 1418  BP: (!) 165/77 (!) 153/74  Pulse:    Resp:    Temp:    SpO2:     Eager to discharge if possible Concerned about the obs status Feels better overall  General: No acute distress. Cardiovascular: Heart sounds show Orianna Biskup regular rate, and rhythm. No gallops or rubs. No murmurs. No JVD. Lungs: Clear to auscultation bilaterally with good air movement. No rales, rhonchi or wheezes. Abdomen: Soft, nontender, nondistended with normal active bowel sounds. No masses. No hepatosplenomegaly. Neurological: Alert and oriented 3. Moves all extremities 4 with equal strength. Cranial nerves II through XII grossly intact. Skin: Warm and dry. No rashes or lesions. Extremities: induration to R ac fossa  Discharge Instructions   Discharge Instructions     Call MD for:  difficulty breathing, headache or visual disturbances   Complete by: As directed    Call MD for:  extreme fatigue   Complete by: As directed    Call MD for:  hives   Complete by: As directed    Call MD for:  persistant dizziness or light-headedness   Complete by: As directed    Call MD for:  persistant nausea and vomiting   Complete by: As directed    Call MD for:  redness, tenderness, or signs of infection (pain, swelling, redness, odor or green/yellow discharge around incision site)   Complete by: As directed    Call MD for:  severe uncontrolled pain   Complete by:  As directed    Call MD for:  temperature >100.4   Complete by: As directed    Diet - low sodium heart healthy   Complete by: As directed    Discharge instructions   Complete by: As directed    You were seen for low blood pressure and concern for infection.  For your blood pressure, you got IV fluids.  I want you to stop your losartan and spironolactone for now until you see your PCP in follow up to make sure your blood pressures aren't too low.  Try to see your PCP within about Luken Shadowens week (either the end of this week or early next week).   This has resolved on 2/24.  We'll send you home with 5 days of antibiotics for your right arm.  I think this is Derin Matthes superficial thrombophlebitis, but it should be watched cautiously for signs of worsening.  I expect this should gradually improve with antibiotics and supportive care.  If this worsens, you should return to the hospital/emergency department.  Follow up with your PCP outpatient within Zaydah Nawabi week or so.  If it hasn't improved, they should consider an ultrasound.  Return for new, recurrent, or worsening symptoms.  Please ask your  PCP to request records from this hospitalization so they know what was done and what the next steps will be.   Increase activity slowly   Complete by: As directed       Allergies as of 11/26/2023       Reactions   Oxybutynin Other (See Comments)   Unknown per Pt    Prozac [fluoxetine Hcl] Other (See Comments)   "Could not lift head up or get out of bed"    Ritalin [methylphenidate] Other (See Comments)   Unknown per Pt    Rosuvastatin Other (See Comments)   Unknown per Pt    Lyrica [pregabalin] Anxiety, Other (See Comments)   "Felt weird"        Medication List     PAUSE taking these medications    losartan 100 MG tablet Wait to take this until your doctor or other care provider tells you to start again. Hold your blood pressure medicine until you follow up with your PCP outpatient Commonly known as:  COZAAR Take 1 tablet (100 mg total) by mouth daily.   spironolactone 25 MG tablet Wait to take this until your doctor or other care provider tells you to start again. Hold your blood pressure medicine until follow up with your PCP outpatient. Commonly known as: ALDACTONE Take 0.5 tablets (12.5 mg total) by mouth daily.       STOP taking these medications    Paxlovid (150/100) 10 x 150 MG & 10 x 100MG  Tbpk Generic drug: nirmatrelvir/ritonavir (renal dosing)   predniSONE 10 MG tablet Commonly known as: DELTASONE       TAKE these medications    buPROPion 300 MG 24 hr tablet Commonly known as: WELLBUTRIN XL Take 300 mg by mouth in the morning.   cefadroxil 500 MG capsule Commonly known as: DURICEF Take 1 capsule (500 mg total) by mouth 2 (two) times daily for 5 days.   clonazePAM 1 MG tablet Commonly known as: KLONOPIN Take 1 mg by mouth at bedtime.   diphenhydrAMINE 25 MG tablet Commonly known as: BENADRYL Take 25 mg by mouth in the morning and at bedtime.   fluticasone 50 MCG/ACT nasal spray Commonly known as: FLONASE Place 1 spray into both nostrils daily as needed for allergies.   gabapentin 100 MG capsule Commonly known as: NEURONTIN Take 1 capsule (100 mg total) by mouth at bedtime.   meloxicam 15 MG tablet Commonly known as: MOBIC Take 15 mg by mouth daily.   promethazine-dextromethorphan 6.25-15 MG/5ML syrup Commonly known as: PROMETHAZINE-DM Take 5 mLs by mouth 4 (four) times daily as needed for cough.   simvastatin 40 MG tablet Commonly known as: ZOCOR Take by mouth.   traZODone 150 MG tablet Commonly known as: DESYREL Take 225 mg by mouth at bedtime.   venlafaxine XR 75 MG 24 hr capsule Commonly known as: EFFEXOR-XR Take 75 mg by mouth daily with breakfast.       Allergies  Allergen Reactions   Oxybutynin Other (See Comments)    Unknown per Pt    Prozac [Fluoxetine Hcl] Other (See Comments)    "Could not lift head up or get out of  bed"    Ritalin [Methylphenidate] Other (See Comments)    Unknown per Pt    Rosuvastatin Other (See Comments)    Unknown per Pt    Lyrica [Pregabalin] Anxiety and Other (See Comments)    "Felt weird"      The results of significant diagnostics from this hospitalization (including imaging, microbiology,  ancillary and laboratory) are listed below for reference.    Significant Diagnostic Studies: DG Chest Port 1 View Result Date: 11/25/2023 CLINICAL DATA:  Sepsis EXAM: PORTABLE CHEST 1 VIEW COMPARISON:  Chest radiograph dated 11/21/2023 FINDINGS: Lines/tubes: Right chest wall port tip projects over the SVC. Lungs: Well inflated lungs. No focal consolidation. Pleura: No pneumothorax or pleural effusion. Heart/mediastinum: The heart size and mediastinal contours are within normal limits. Bones: Cervical spinal fixation hardware appears intact. Partially imaged bilateral breast implants. Left axillary surgical clips. IMPRESSION: No acute disease. Electronically Signed   By: Agustin Cree M.D.   On: 11/25/2023 13:13   DG Chest Portable 1 View Result Date: 11/21/2023 CLINICAL DATA:  Cough and fatigue. EXAM: PORTABLE CHEST 1 VIEW COMPARISON:  Chest CT with contrast 09/17/2023. FINDINGS: The heart size and mediastinal contours are within normal limits. The lungs are expiratory with limited view of the lower lung zones. The visualized lungs are clear of infiltrates. There are left axillary surgical clips. Breast implants are partially visible. Right chest port with IJ approach catheter terminating in the mid SVC. The visualized skeletal structures are unremarkable. IMPRESSION: Expiratory chest with limited view of the lower lung zones. No evidence of acute chest disease. Electronically Signed   By: Almira Bar M.D.   On: 11/21/2023 06:11    Microbiology: No results found for this or any previous visit (from the past 240 hours).   Labs: Basic Metabolic Panel: Recent Labs  Lab 11/21/23 0403  11/22/23 0113 11/25/23 1155 11/25/23 1809 11/26/23 0339  NA 126* 132* 134*  --  137  K 3.2* 3.8 4.0  --  3.9  CL 95* 102 98  --  104  CO2 21* 23 28  --  24  GLUCOSE 178* 118* 118*  --  101*  BUN 12 9 14   --  11  CREATININE 0.97 0.85 1.24*  --  0.83  CALCIUM 8.3* 7.8* 9.0  --  8.5*  MG 1.8  --   --  2.0  --   PHOS 3.1  --   --   --   --    Liver Function Tests: Recent Labs  Lab 11/21/23 0403 11/22/23 0113 11/25/23 1155  AST 23 20 23   ALT 18 18 27   ALKPHOS 66 71 59  BILITOT 0.4 0.5 0.7  PROT 6.1* 5.4* 6.1*  ALBUMIN 3.4* 3.0* 3.4*   No results for input(s): "LIPASE", "AMYLASE" in the last 168 hours. No results for input(s): "AMMONIA" in the last 168 hours. CBC: Recent Labs  Lab 11/21/23 0403 11/22/23 0113 11/25/23 1155 11/26/23 0339  WBC 4.8 5.4 8.2 6.6  NEUTROABS 4.2  --  6.5  --   HGB 9.9* 9.6* 10.6* 9.6*  HCT 30.3* 29.4* 32.8* 30.7*  MCV 85.4 85.5 86.5 86.7  PLT 171 158 241 224   Cardiac Enzymes: No results for input(s): "CKTOTAL", "CKMB", "CKMBINDEX", "TROPONINI" in the last 168 hours. BNP: BNP (last 3 results) No results for input(s): "BNP" in the last 8760 hours.  ProBNP (last 3 results) No results for input(s): "PROBNP" in the last 8760 hours.  CBG: Recent Labs  Lab 11/25/23 1159  GLUCAP 128*       Signed:  Lacretia Nicks MD.  [facility] 11/26/2023, 5:43 PM

## 2023-11-26 NOTE — Progress Notes (Signed)
 PT Cancellation/Screen Note  Patient Details Name: Allison Pierce MRN: [id number] DOB: 02-24-1946   Cancelled Treatment:    Reason Eval/Treat Not Completed: PT screened, no needs identified, will sign off. Per OT Theodoro Clock) -"pt is at her baseline and says she does not need PT". PT will sign off at this time.    Faye Ramsay, PT Acute Rehabilitation  Office: [phone]

## 2023-11-26 NOTE — Evaluation (Signed)
 Occupational Therapy Evaluation Patient Details Name: Allison Pierce MRN: [id number] DOB: 1946/07/09 Today's Date: 11/26/2023   History of Present Illness   78 year old female who presents after 1 day home from [facility] for Covid, with need to return due to swelling, redness and pain in her RT arm post IV removal before discharge home. On morning of admission, she woke up and was extremely weak, confused and unstable when walking. Her husband brought her to ED. On arrival blood pressure systolic was 79.  Pt admitted under OBS status for Sepsis due to cellulitis.  PMH includesa anxiety, C. difficile diarrhea, depression, GERD, hypertension, hyperlipidemia, left breast cancer, ulcerative colitis, UTI, human papilloma virus, vulvar lesion.     Clinical Impressions Patient evaluated by Occupational Therapy with no further acute OT needs identified. All education has been completed with focus on elevation and care of RUE, and the patient has no further questions.  See below for any follow-up Occupational Therapy or equipment needs. OT is signing off. Thank you for this referral.      If plan is discharge home, recommend the following:   Assistance with cooking/housework;Assist for transportation     Functional Status Assessment   Patient has not had a recent decline in their functional status     Equipment Recommendations   None recommended by OT     Recommendations for Other Services         Precautions/Restrictions   Precautions Precaution/Restrictions Comments: Airborn/Contact precautions Restrictions Weight Bearing Restrictions Per Provider Order: No     Mobility Bed Mobility Overal bed mobility: Independent                  Transfers                          Balance Overall balance assessment: Independent                                         ADL either performed or assessed with clinical judgement   ADL Overall  ADL's : At baseline                                       General ADL Comments: Pt able to demonstrate a full standing sink bath, UE dressing, LE dressing and grooming without need of any assistance save line management. Pt reports no change from previously and feels safe performing her own ADLs at home.     Vision Ability to See in Adequate Light: 0 Adequate Patient Visual Report: No change from baseline       Perception         Praxis         Pertinent Vitals/Pain Pain Assessment Pain Assessment: No/denies pain (Pt reports only hurts is flexing RT elbow to max end.)     Extremity/Trunk Assessment Upper Extremity Assessment Upper Extremity Assessment: Right hand dominant;RUE deficits/detail RUE Deficits / Details: ROM and MMT intact but elbow cubital area remains red with pain if pt given max elbow flexion. Noted pocket of edema on forearm. Pt shown elevation methods with pillows as well as resting RUE on head and pumping hand as secondary edema management strategy with pt demonstrating correctly without increased pain reported. RUE Sensation: WNL RUE Coordination: WNL  Lower Extremity Assessment Lower Extremity Assessment: Overall WFL for tasks assessed   Cervical / Trunk Assessment Cervical / Trunk Assessment: Normal   Communication Communication Communication: No apparent difficulties Factors Affecting Communication: Hearing impaired   Cognition Arousal: Alert Behavior During Therapy: WFL for tasks assessed/performed Cognition: No apparent impairments                               Following commands: Intact       Cueing  General Comments          Exercises     Shoulder Instructions      Home Living Family/patient expects to be discharged to:: Private residence Living Arrangements: Spouse/significant other Available Help at Discharge: Family Type of Home: House Home Access: Stairs to enter Secretary/administrator of  Steps: 4 Entrance Stairs-Rails: Can reach both Home Layout: Two level;Able to live on main level with bedroom/bathroom     Bathroom Shower/Tub: Tub/shower unit;Walk-in shower   Bathroom Toilet: Standard     Home Equipment: Agricultural consultant (2 wheels)          Prior Functioning/Environment Prior Level of Function : Independent/Modified Independent               ADLs Comments: Ind with ADLs/IADLs, home mgt, drives    OT Problem List: Pain   OT Treatment/Interventions:        OT Goals(Current goals can be found in the care plan section)   Acute Rehab OT Goals Patient Stated Goal: "Get cleaned up" OT Goal Formulation: All assessment and education complete, DC therapy ADL Goals Additional ADL Goal #1: Pt will verbalize and/or demonstrate care of RUE with elevation and edema management as well as gentle movement within pain free limits to avoid stiffness to RUE. Marland Kitchen   OT Frequency:       Co-evaluation              AM-PAC OT "6 Clicks" Daily Activity     Outcome Measure Help from another person eating meals?: None Help from another person taking care of personal grooming?: None Help from another person toileting, which includes using toliet, bedpan, or urinal?: None Help from another person bathing (including washing, rinsing, drying)?: None Help from another person to put on and taking off regular upper body clothing?: None Help from another person to put on and taking off regular lower body clothing?: None 6 Click Score: 24   End of Session Nurse Communication: Other (comment) (RN to room with pt's morning meds.)  Activity Tolerance: Patient tolerated treatment well Patient left: in chair;with call bell/phone within reach;with nursing/sitter in room  OT Visit Diagnosis: Pain Pain - Right/Left: Right Pain - part of body: Arm                Time: 7846-9629 OT Time Calculation (min): 32 min Charges:  OT General Charges $OT Visit: 1 Visit OT  Evaluation $OT Eval Low Complexity: 1 Low OT Treatments $Self Care/Home Management : 8-22 mins  Victorino Dike, OT Acute Rehab Services Office: [phone] 11/26/2023   Theodoro Clock 11/26/2023, 10:21 AM

## 2023-11-26 NOTE — Care Management Obs Status (Signed)
 MEDICARE OBSERVATION STATUS NOTIFICATION   Patient Details  Name: Allison Pierce MRN: [id number] Date of Birth: 1946/05/25   Medicare Observation Status Notification Given:  Yes    Larrie Kass, LCSW 11/26/2023, 12:09 PM

## 2023-11-29 ENCOUNTER — Inpatient Hospital Stay: Payer: Medicare Other

## 2023-11-29 ENCOUNTER — Inpatient Hospital Stay (HOSPITAL_BASED_OUTPATIENT_CLINIC_OR_DEPARTMENT_OTHER): Payer: Medicare Other | Admitting: Hematology and Oncology

## 2023-11-29 VITALS — BP 159/77 | HR 81 | Temp 98.4°F | Resp 20

## 2023-11-29 DIAGNOSIS — Z5112 Encounter for antineoplastic immunotherapy: Secondary | ICD-10-CM | POA: Diagnosis not present

## 2023-11-29 DIAGNOSIS — Z171 Estrogen receptor negative status [ER-]: Secondary | ICD-10-CM | POA: Diagnosis not present

## 2023-11-29 DIAGNOSIS — Z9012 Acquired absence of left breast and nipple: Secondary | ICD-10-CM | POA: Diagnosis not present

## 2023-11-29 DIAGNOSIS — C50412 Malignant neoplasm of upper-outer quadrant of left female breast: Secondary | ICD-10-CM

## 2023-11-29 DIAGNOSIS — Z8042 Family history of malignant neoplasm of prostate: Secondary | ICD-10-CM | POA: Diagnosis not present

## 2023-11-29 DIAGNOSIS — Z803 Family history of malignant neoplasm of breast: Secondary | ICD-10-CM | POA: Diagnosis not present

## 2023-11-29 DIAGNOSIS — Z95828 Presence of other vascular implants and grafts: Secondary | ICD-10-CM

## 2023-11-29 DIAGNOSIS — C7951 Secondary malignant neoplasm of bone: Secondary | ICD-10-CM | POA: Diagnosis not present

## 2023-11-29 DIAGNOSIS — Z79899 Other long term (current) drug therapy: Secondary | ICD-10-CM | POA: Diagnosis not present

## 2023-11-29 DIAGNOSIS — D352 Benign neoplasm of pituitary gland: Secondary | ICD-10-CM | POA: Diagnosis not present

## 2023-11-29 LAB — CBC WITH DIFFERENTIAL (CANCER CENTER ONLY)
Abs Immature Granulocytes: 0.21 10*3/uL — ABNORMAL HIGH (ref 0.00–0.07)
Basophils Absolute: 0.1 10*3/uL (ref 0.0–0.1)
Basophils Relative: 1 %
Eosinophils Absolute: 0.2 10*3/uL (ref 0.0–0.5)
Eosinophils Relative: 2 %
HCT: 34.8 % — ABNORMAL LOW (ref 36.0–46.0)
Hemoglobin: 11.3 g/dL — ABNORMAL LOW (ref 12.0–15.0)
Immature Granulocytes: 2 %
Lymphocytes Relative: 11 %
Lymphs Abs: 1.1 10*3/uL (ref 0.7–4.0)
MCH: 27.4 pg (ref 26.0–34.0)
MCHC: 32.5 g/dL (ref 30.0–36.0)
MCV: 84.5 fL (ref 80.0–100.0)
Monocytes Absolute: 0.7 10*3/uL (ref 0.1–1.0)
Monocytes Relative: 7 %
Neutro Abs: 7.9 10*3/uL — ABNORMAL HIGH (ref 1.7–7.7)
Neutrophils Relative %: 77 %
Platelet Count: 345 10*3/uL (ref 150–400)
RBC: 4.12 MIL/uL (ref 3.87–5.11)
RDW: 13.1 % (ref 11.5–15.5)
WBC Count: 10.1 10*3/uL (ref 4.0–10.5)
nRBC: 0 % (ref 0.0–0.2)

## 2023-11-29 LAB — CMP (CANCER CENTER ONLY)
ALT: 30 U/L (ref 0–44)
AST: 23 U/L (ref 15–41)
Albumin: 4 g/dL (ref 3.5–5.0)
Alkaline Phosphatase: 78 U/L (ref 38–126)
Anion gap: 4 — ABNORMAL LOW (ref 5–15)
BUN: 13 mg/dL (ref 8–23)
CO2: 29 mmol/L (ref 22–32)
Calcium: 9.1 mg/dL (ref 8.9–10.3)
Chloride: 100 mmol/L (ref 98–111)
Creatinine: 0.96 mg/dL (ref 0.44–1.00)
GFR, Estimated: >60 mL/min (ref >60)
Glucose, Bld: 97 mg/dL (ref 70–99)
Potassium: 4.5 mmol/L (ref 3.5–5.1)
Sodium: 133 mmol/L — ABNORMAL LOW (ref 135–145)
Total Bilirubin: 0.4 mg/dL (ref 0.0–1.2)
Total Protein: 6.8 g/dL (ref 6.5–8.1)

## 2023-11-29 MED ORDER — SODIUM CHLORIDE 0.9% FLUSH
10.0000 mL | Freq: Once | INTRAVENOUS | Status: AC | PRN
Start: 2023-11-29 — End: 2023-11-29
  Administered 2023-11-29: 10 mL

## 2023-11-29 MED ORDER — HEPARIN SOD (PORK) LOCK FLUSH 100 UNIT/ML IV SOLN
500.0000 [IU] | Freq: Once | INTRAVENOUS | Status: AC | PRN
  Administered 2023-11-29: 500 [IU]

## 2023-11-29 MED ORDER — PROMETHAZINE-DM 6.25-15 MG/5ML PO SYRP: 5.0000 mL | ORAL_SOLUTION | Freq: Four times a day (QID) | ORAL | 0 refills | Status: DC | PRN

## 2023-11-29 NOTE — Assessment & Plan Note (Signed)
 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  T4 vertebral body Bone scan 04/25/2022: Abnormal uptake in the sternum, right parietal region of calvarium, right posterior seventh rib, T5, left sternoclavicular joint 07/30/2022: Bone scan: Interval resolution/decreasing accumulation of multiple bone metastases 10/30/2022: CT CAP: Mildly prominent mediastinal lymph nodes are similar, no pulmonary metastasis, skeletal lesions increasing sclerosis, no visceral metastases 01/16/2023: CT CAP: Stable prominent mediastinal lymph nodes, slight increase in the small nodes in the mesentery could be reactive, scattered bone metastases stable 05/16/2023: CT CAP: Stable bone metastases, unchanged pretracheal lymph nodes.  Previous mesenteric lymph nodes not conspicuous.  Diffuse colonic mucosal thickening: Colitis   Treatment: Palliative chemotherapy with Taxotere [name] and Perjeta every 3 weeks x1 cycle given 05/05/2022 (discontinued for severe toxicities and hospitalization)   ---------------------------------------------------------------------------------------------------------------------------------------------- Chemotoxicities: Hospitalization 05/11/2022-05/18/2022: Convulsive syncope, chemo induced neutropenia, oral mucositis, chronic CHF, severe diarrhea with dehydration ECHO 06/26/22: EF 55-60% (follows with Bensimhon) Hospitalization 11/18/2023-11/23/2023: COVID-19, hyponatremia and metabolic acidosis   Treatment EVOJ:5/00/93 [name] maintenance.  (Added Perjeta 06/21/22-discontinued 06/2023 secondary to colitis) CT CAP 05/16/2023: Bone metastases unchanged, unchanged pretracheal lymph nodes, colitis CT CAP 09/17/2023: Unchanged bone  metastases.   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 Barbaraann Cao.  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 We will change [name] to every 4 weeks.

## 2023-11-29 NOTE — Progress Notes (Signed)
 Patient Care Team: Lupita Raider, MD as PCP - General (Family Medicine) Van Clines, MD as Consulting Physician (Neurology) Pershing Proud, RN as Oncology Nurse Navigator Donnelly Angelica, RN as Oncology Nurse Navigator Abigail Miyamoto, MD as Consulting Physician (General Surgery) Serena Croissant, MD as Consulting Physician (Hematology and Oncology) Lonie Peak, MD as Attending Physician (Radiation Oncology)  DIAGNOSIS:  Encounter Diagnosis  Name Primary?   Malignant neoplasm of upper-outer quadrant of left breast in female, estrogen receptor negative (HCC) Yes    SUMMARY OF ONCOLOGIC HISTORY: Oncology History  Malignant neoplasm of upper-outer quadrant of left breast in female, estrogen receptor negative (HCC)  02/10/2022 Initial Diagnosis   Palpable left breast masses and calcifications: 1.8 cm at 1:00 and 1.7 cm at 9:00, calcifications not biopsy.  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 Report date is 03/05/2022.  The CustomNext-Cancer+RNAinsight panel offered by [facility] includes sequencing and rearrangement analysis for the following 48 genes:  APC, ATM, AXIN2, BARD1, BMPR1A, BRCA1, BRCA2, BRIP1, CDH1, CDK4, CDKN2A, CHEK2, CTNNA1, DICER1, EGFR, EPCAM, GREM1, HOXB13, KIT, MEN1, MLH1, MSH2, MSH3, MSH6, MUTYH, NBN, NF1, NTHL1, PALB2, PDGFRA, PMS2, POLD1, POLE, PTEN, RAD50, RAD51C, RAD51D, SDHA, SDHB, SDHC, SDHD, SMAD4, SMARCA4, STK11, TP53, TSC1, TSC2, and VHL.  RNA data is routinely analyzed for use in variant interpretation for all genes.   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, LCIS, 5/5 lymph nodes positive with extranodal extension, margins negative ER 0%, PR 0%, HER2 3+, Ki-67 20%   04/27/2022 Cancer Staging   Staging form: Breast, AJCC 8th Edition - Pathologic: Stage IIIA (pT2, pN2, cM0, G2, ER-, PR-, HER2+) - Signed by Serena Croissant, MD on 04/27/2022 Histologic grading system: 3 grade system   05/05/2022 - 05/26/2022 Chemotherapy   Patient is on Treatment Plan : BREAST DOCEtaxel + Trastuzumab + Pertuzumab (THP) q21d x 8 cycles / Trastuzumab + Pertuzumab q21d x 4 cycles     05/26/2022 -  Chemotherapy   Patient is on Treatment Plan : BREAST MAINTENANCE Trastuzumab IV (6) or SQ (600) D1 q21d X 11 Cycles     06/12/2023 Cancer Staging   Staging form: Breast, AJCC 8th Edition - Pathologic: Stage IV (cM1) - Signed by [name], NP on 06/12/2023     CHIEF COMPLIANT: Follow-up after recent hospitalization  HISTORY OF PRESENT ILLNESS:   History of Present Illness   Allison Pierce is a 78 year old female who presents for follow-up and medication management.  She is currently undergoing chemotherapy with infusions scheduled every four weeks, with the next infusion on March 3rd.  She has a history of phlebitis in her right arm   The area was described as 'hard as a rock' and was previously infected, leading to hospitalization. The vein is now scarred due to procedures performed during her hospital stay.  She recently experienced a sudden onset of severe COVID-19 symptoms, requiring emergency medical services. She completed a course of Paxlovid and cefadroxil, with some improvement in her breathing. However, she continues to experience lingering cold symptoms and  is taking promethazine dextromethorphan for cough management.  Her medication history includes previous use of simvastatin for cholesterol management, which was switched to losartan for blood pressure management. She does not  currently have a prescription for simvastatin. Her blood pressure was low while on losartan, leading to its discontinuation. She is awaiting further guidance regarding her medication regimen.  She has a history of acid reflux, for which she takes daily medication. She experiences nighttime coughing, which may be related to her reflux.         ALLERGIES:  is allergic to oxybutynin, prozac [fluoxetine hcl], ritalin [methylphenidate], rosuvastatin, and lyrica [pregabalin].  MEDICATIONS:  Current Outpatient Medications  Medication Sig Dispense Refill   buPROPion (WELLBUTRIN XL) 300 MG 24 hr tablet Take 300 mg by mouth in the morning.     cefadroxil (DURICEF) 500 MG capsule Take 1 capsule (500 mg total) by mouth 2 (two) times daily for 5 days. 10 capsule 0   clonazePAM (KLONOPIN) 1 MG tablet Take 1 mg by mouth at bedtime.     diphenhydrAMINE (BENADRYL) 25 MG tablet Take 25 mg by mouth in the morning and at bedtime.     fluticasone (FLONASE) 50 MCG/ACT nasal spray Place 1 spray into both nostrils daily as needed for allergies. (Patient not taking: Reported on 11/25/2023)     gabapentin (NEURONTIN) 100 MG capsule Take 1 capsule (100 mg total) by mouth at bedtime. 14 capsule 0   meloxicam (MOBIC) 15 MG tablet Take 15 mg by mouth daily.     promethazine-dextromethorphan (PROMETHAZINE-DM) 6.25-15 MG/5ML syrup Take 5 mLs by mouth 4 (four) times daily as needed for cough. 180 mL 0   simvastatin (ZOCOR) 40 MG tablet Take by mouth. (Patient not taking: Reported on 11/21/2023)     [Paused] spironolactone (ALDACTONE) 25 MG tablet Take 0.5 tablets (12.5 mg total) by mouth daily. (Patient taking differently: Take 12.5 mg by mouth in the morning.) 15 tablet 6   traZODone (DESYREL) 150 MG tablet Take 225 mg by mouth at bedtime.     venlafaxine XR (EFFEXOR-XR) 75 MG 24 hr capsule Take 75 mg by mouth daily with breakfast.     No current facility-administered medications for this visit.    PHYSICAL  EXAMINATION: ECOG PERFORMANCE STATUS: 1 - Symptomatic but completely ambulatory  Vitals:   11/29/23 1239  BP: (!) 159/77  Pulse: 81  Resp: 20  Temp: 98.4 F (36.9 C)  SpO2: 97%   There were no vitals filed for this visit.  Physical Exam   EXTREMITIES: No signs of infection in the arm.        LABORATORY DATA:  I have reviewed the data as listed    Latest Ref Rng & Units 11/29/2023   12:16 PM 11/26/2023    3:39 AM 11/25/2023   11:55 AM  CMP  Glucose 70 - 99 mg/dL 97  161  096   BUN 8 - 23 mg/dL 13  11  14    Creatinine 0.44 - 1.00 mg/dL 0.45  4.09  8.11   Sodium 135 - 145 mmol/L 133  137  134   Potassium 3.5 - 5.1 mmol/L 4.5  3.9  4.0   Chloride 98 - 111 mmol/L 100  104  98   CO2 22 - 32 mmol/L 29  24  28    Calcium 8.9 - 10.3 mg/dL 9.1  8.5  9.0   Total Protein 6.5 - 8.1 g/dL 6.8   6.1   Total Bilirubin 0.0 - 1.2 mg/dL 0.4  0.7   Alkaline Phos 38 - 126 U/L 78   59   AST 15 - 41 U/L 23   23   ALT 0 - 44 U/L 30   27     Lab Results  Component Value Date   WBC 10.1 11/29/2023   HGB 11.3 (L) 11/29/2023   HCT 34.8 (L) 11/29/2023   MCV 84.5 11/29/2023   PLT 345 11/29/2023   NEUTROABS 7.9 (H) 11/29/2023    ASSESSMENT & PLAN:  Malignant neoplasm of upper-outer quadrant of left breast in female, estrogen receptor negative (HCC) 04/07/2022:Left mastectomy: 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  T4 vertebral body Bone scan 04/25/2022: Abnormal uptake in the sternum, right parietal region of calvarium, right posterior seventh rib, T5, left sternoclavicular joint 07/30/2022: Bone scan: Interval resolution/decreasing accumulation of multiple bone metastases 10/30/2022: CT CAP: Mildly prominent mediastinal lymph nodes are similar, no  pulmonary metastasis, skeletal lesions increasing sclerosis, no visceral metastases 01/16/2023: CT CAP: Stable prominent mediastinal lymph nodes, slight increase in the small nodes in the mesentery could be reactive, scattered bone metastases stable 05/16/2023: CT CAP: Stable bone metastases, unchanged pretracheal lymph nodes.  Previous mesenteric lymph nodes not conspicuous.  Diffuse colonic mucosal thickening: Colitis   Treatment: Palliative chemotherapy with Taxotere [name] and Perjeta every 3 weeks x1 cycle given 05/05/2022 (discontinued for severe toxicities and hospitalization)   ---------------------------------------------------------------------------------------------------------------------------------------------- Chemotoxicities: Hospitalization 05/11/2022-05/18/2022: Convulsive syncope, chemo induced neutropenia, oral mucositis, chronic CHF, severe diarrhea with dehydration ECHO 06/26/22: EF 55-60% (follows with [facility]) Hospitalization 11/18/2023-11/23/2023: COVID-19, hyponatremia and metabolic acidosis   Treatment ZOXW:9/60/45 [name] maintenance.  (Added Perjeta 06/21/22-discontinued 06/2023 secondary to colitis) CT CAP 05/16/2023: Bone metastases unchanged, unchanged pretracheal lymph nodes, colitis CT CAP 09/17/2023: Unchanged bone metastases.   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 Barbaraann Cao.  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 We will change [name] to every 4 weeks. ------------------------------------- Assessment and Plan    Phlebitis Phlebitis in the arm secondary to IV insertion. The vein is not infected but remains hard due to scarring. Improvement noted, but full resolution will take several months. Explained that the vein will remain hard for a long time and will slowly soften over the next six months. - Monitor for  signs of infection - Educate on the long-term nature of vein hardening and scarring  COVID-19 Recent COVID-19 infection with significant symptoms. Completed Paxlovid treatment and is no longer infectious. Residual symptoms include cough and general weakness. Discussed that the cough may linger for four to five weeks and recommended hydration and rest. - Prescribe promethazine dextromethorphan for cough - Advise on hydration and rest - Recommend Mucinex for additional symptom relief if needed  Hypertension Hypertension management complicated by recent hypotensive episodes likely due to losartan. Blood pressure is currently elevated. Discussed that losartan likely caused the hypotensive episodes and the decision to discontinue it. Emphasized the importance of consulting with Dr. Larrie Kass for further management. - Discontinue losartan - Consult with cardiologist Dr. Larrie Kass on March 4th for further management of hypertension and potential reintroduction of valsartan  Hyperlipidemia Hyperlipidemia management requires re-evaluation. Previous prescription for simvastatin was not provided. Discussed that simvastatin  should not be started until reviewed by a primary care physician. Emphasized the need to consult with Dr. Larrie Kass for further management. - Do not start simvastatin until reviewed by primary care physician - Consult with cardiologist Dr. Larrie Kass on March 4th for further management of hyperlipidemia  General Health Maintenance Discussed lifestyle modifications to manage acid reflux and improve overall health. Emphasized early meals, avoiding evening coffee, and daily exercise. Encouraged walking to improve strength. - Advise on early meals, avoiding evening coffee, and daily exercise to manage acid reflux - Encourage walking for at least 10 minutes twice daily to improve strength  Follow-up - Infusion appointment on March 3rd - Subsequent infusion on March 31st - Provide printed schedule of  appointments.          No orders of the defined types were placed in this encounter.  The patient has a good understanding of the overall plan. she agrees with it. she will call with any problems that may develop before the next visit here. Total time spent: 30 mins including face to face time and time spent for planning, charting and co-ordination of care   Tamsen Meek, MD 11/29/23

## 2023-11-30 ENCOUNTER — Telehealth: Payer: Self-pay

## 2023-11-30 LAB — CULTURE, BLOOD (ROUTINE X 2)
Culture: NO GROWTH
Culture: NO GROWTH

## 2023-11-30 NOTE — Telephone Encounter (Signed)
 Pt called and states she called per phx and they told her they do not have the promethazine DM rx. This nurse called pt's phx and s/w pharmacist who states the rx has been ready since 11/29/23 at 2 PM. Pt aware and will pick up.

## 2023-12-03 ENCOUNTER — Encounter: Payer: Self-pay | Admitting: Hematology and Oncology

## 2023-12-03 ENCOUNTER — Telehealth: Payer: Self-pay

## 2023-12-03 ENCOUNTER — Ambulatory Visit: Payer: Medicare Other

## 2023-12-03 DIAGNOSIS — C50919 Malignant neoplasm of unspecified site of unspecified female breast: Secondary | ICD-10-CM | POA: Diagnosis not present

## 2023-12-03 DIAGNOSIS — E871 Hypo-osmolality and hyponatremia: Secondary | ICD-10-CM | POA: Diagnosis not present

## 2023-12-03 DIAGNOSIS — I10 Essential (primary) hypertension: Secondary | ICD-10-CM | POA: Diagnosis not present

## 2023-12-03 DIAGNOSIS — U071 COVID-19: Secondary | ICD-10-CM | POA: Diagnosis not present

## 2023-12-03 DIAGNOSIS — R5383 Other fatigue: Secondary | ICD-10-CM | POA: Diagnosis not present

## 2023-12-03 DIAGNOSIS — C7951 Secondary malignant neoplasm of bone: Secondary | ICD-10-CM | POA: Diagnosis not present

## 2023-12-03 DIAGNOSIS — I808 Phlebitis and thrombophlebitis of other sites: Secondary | ICD-10-CM | POA: Diagnosis not present

## 2023-12-03 NOTE — Telephone Encounter (Signed)
 Pt called and states she has the flu and would like to cancel tx for today. Returned pt call and she did not answer. Appt cancelled per request and message sent to schedulers to r/s.

## 2023-12-04 ENCOUNTER — Ambulatory Visit (HOSPITAL_COMMUNITY)
Admission: RE | Admit: 2023-12-04 | Discharge: 2023-12-04 | Disposition: A | Discharge status: Home / Self Care | Payer: Medicare Other | Source: Ambulatory Visit | Attending: Internal Medicine | Admitting: Internal Medicine

## 2023-12-04 ENCOUNTER — Ambulatory Visit (HOSPITAL_BASED_OUTPATIENT_CLINIC_OR_DEPARTMENT_OTHER)
Admission: RE | Admit: 2023-12-04 | Discharge: 2023-12-04 | Disposition: A | Discharge status: Home / Self Care | Payer: Medicare Other | Source: Ambulatory Visit | Attending: Internal Medicine | Admitting: Internal Medicine

## 2023-12-04 ENCOUNTER — Encounter (HOSPITAL_COMMUNITY): Payer: Self-pay | Admitting: Internal Medicine

## 2023-12-04 VITALS — BP 120/80 | HR 81 | Wt 157.0 lb

## 2023-12-04 DIAGNOSIS — C50212 Malignant neoplasm of upper-inner quadrant of left female breast: Secondary | ICD-10-CM

## 2023-12-04 DIAGNOSIS — I5032 Chronic diastolic (congestive) heart failure: Secondary | ICD-10-CM

## 2023-12-04 DIAGNOSIS — C7951 Secondary malignant neoplasm of bone: Secondary | ICD-10-CM | POA: Insufficient documentation

## 2023-12-04 DIAGNOSIS — Z79899 Other long term (current) drug therapy: Secondary | ICD-10-CM | POA: Insufficient documentation

## 2023-12-04 DIAGNOSIS — E785 Hyperlipidemia, unspecified: Secondary | ICD-10-CM | POA: Diagnosis not present

## 2023-12-04 DIAGNOSIS — R9431 Abnormal electrocardiogram [ECG] [EKG]: Secondary | ICD-10-CM | POA: Insufficient documentation

## 2023-12-04 DIAGNOSIS — Z9882 Breast implant status: Secondary | ICD-10-CM | POA: Insufficient documentation

## 2023-12-04 DIAGNOSIS — T451X5A Adverse effect of antineoplastic and immunosuppressive drugs, initial encounter: Secondary | ICD-10-CM | POA: Diagnosis not present

## 2023-12-04 DIAGNOSIS — Z5181 Encounter for therapeutic drug level monitoring: Secondary | ICD-10-CM | POA: Insufficient documentation

## 2023-12-04 DIAGNOSIS — F419 Anxiety disorder, unspecified: Secondary | ICD-10-CM | POA: Diagnosis not present

## 2023-12-04 DIAGNOSIS — Z8616 Personal history of COVID-19: Secondary | ICD-10-CM | POA: Diagnosis not present

## 2023-12-04 DIAGNOSIS — I427 Cardiomyopathy due to drug and external agent: Secondary | ICD-10-CM | POA: Diagnosis not present

## 2023-12-04 DIAGNOSIS — I10 Essential (primary) hypertension: Secondary | ICD-10-CM | POA: Diagnosis not present

## 2023-12-04 DIAGNOSIS — Z7969 Long term (current) use of other immunomodulators and immunosuppressants: Secondary | ICD-10-CM | POA: Insufficient documentation

## 2023-12-04 DIAGNOSIS — I110 Hypertensive heart disease with heart failure: Secondary | ICD-10-CM | POA: Diagnosis not present

## 2023-12-04 LAB — ECHOCARDIOGRAM COMPLETE
Area-P 1/2: 3.51 cm2
S' Lateral: 3 cm

## 2023-12-04 NOTE — Patient Instructions (Signed)
 Great to see you today!!!  Your physician recommends that you schedule a follow-up appointment in: 4 months with an echocardiogram (July), **we will call you closer to this time to schedule  If you have any questions or concerns before your next appointment please send [place] a message through [place] or call our office at [phone].    TO LEAVE A MESSAGE FOR THE NURSE SELECT OPTION 2, PLEASE LEAVE A MESSAGE INCLUDING: YOUR NAME DATE OF BIRTH CALL BACK NUMBER REASON FOR CALL**this is important as we prioritize the call backs  YOU WILL RECEIVE A CALL BACK THE SAME DAY AS LONG AS YOU CALL BEFORE 4:00 PM

## 2023-12-04 NOTE — Progress Notes (Signed)
 CARDIO-ONCOLOGY CLINIC NOTE  Referring Physician: Dr. Pamelia Hoit Primary Care: Lupita Raider, MD Primary Cardiologist: None   HPI:  Allison Pierce is a 78 y.o. female with HTN, HL, anxiety and left breast cancer referred by Dr. Pamelia Hoit for further evaluation of reduced EF on echocardiogram.   No previous cardiac history. Was diagnosed with left breast CA in 5/23. Stage 1A (ER/PR -, HER2+). Underwent left mastectomy 7/23.   Chemo: 8/23: Docetaxel + Trastuzumab + Pertuzumab (THP) q21d x 8 cycles / Trastuzumab + Pertuzumab q21d x 4 cycles  Maintenance: Trastuzumab IV (6) or SQ (600) D1 q21d X 11 Cycles   Echos: 03/01/22: EF 60-65% G2DD GLs -18.6% 06/26/22:  EF 55-60% GLS -14.1% 09/21/22 EF 60-65%   Echo 02/12/23 EF 60-65% Personally reviewed  Here for routine f/u. Has been sick for 3 weeks with COVID, Has felt terrible. Missed Hercpetin dose yesterday. Cleda Daub stopped. No edema, orthopnea or PND. Cough improving.   Echo today 12/04/23 EF 60-65% Personally reviewed    Echo 09/17/23 EF 60-65%  Echo 05/29/23 EF 60-65% Personally reviewed  Past Medical History:  Diagnosis Date   Anxiety    C. difficile diarrhea    Depression    GERD (gastroesophageal reflux disease) OCCASIONALLY  TAKE TUMS   Human papilloma virus    Hyperlipidemia    Hypertension    left breast ca 12/2021   UC (ulcerative colitis) (HCC)    UTI (urinary tract infection)    Vulvar lesion     Current Outpatient Medications  Medication Sig Dispense Refill   buPROPion (WELLBUTRIN XL) 300 MG 24 hr tablet Take 300 mg by mouth in the morning.     clonazePAM (KLONOPIN) 1 MG tablet Take 1 mg by mouth at bedtime.     diphenhydrAMINE (BENADRYL) 25 MG tablet Take 25 mg by mouth in the morning and at bedtime.     fluticasone (FLONASE) 50 MCG/ACT nasal spray Place 1 spray into both nostrils daily as needed for allergies.     promethazine-dextromethorphan (PROMETHAZINE-DM) 6.25-15 MG/5ML syrup Take 5 mLs by mouth 4 (four) times  daily as needed for cough. 180 mL 0   traZODone (DESYREL) 150 MG tablet Take 225 mg by mouth at bedtime.     venlafaxine XR (EFFEXOR-XR) 75 MG 24 hr capsule Take 75 mg by mouth daily with breakfast.     gabapentin (NEURONTIN) 100 MG capsule Take 1 capsule (100 mg total) by mouth at bedtime. (Patient not taking: Reported on 12/04/2023) 14 capsule 0   meloxicam (MOBIC) 15 MG tablet Take 15 mg by mouth daily. (Patient not taking: Reported on 12/04/2023)     simvastatin (ZOCOR) 40 MG tablet Take by mouth. (Patient not taking: Reported on 11/21/2023)     [Paused] spironolactone (ALDACTONE) 25 MG tablet Take 0.5 tablets (12.5 mg total) by mouth daily. (Patient not taking: Reported on 12/04/2023) 15 tablet 6   No current facility-administered medications for this encounter.    Allergies  Allergen Reactions   Oxybutynin Other (See Comments)    Unknown per Pt    Prozac [Fluoxetine Hcl] Other (See Comments)    "Could not lift head up or get out of bed"    Ritalin [Methylphenidate] Other (See Comments)    Unknown per Pt    Rosuvastatin Other (See Comments)    Unknown per Pt    Lyrica [Pregabalin] Anxiety and Other (See Comments)    "Felt weird"      Social History   Socioeconomic History   Marital status:  Married    Spouse name: Fayrene Fearing   Number of children: 1   Years of education: 12   Highest education level: Not on file  Occupational History   Occupation: retired  Tobacco Use   Smoking status: Never   Smokeless tobacco: Never  Vaping Use   Vaping status: Never Used  Substance and Sexual Activity   Alcohol use: Yes    Alcohol/week: 7.0 standard drinks of alcohol    Types: 7 Cans of beer per week   Drug use: Never   Sexual activity: Not Currently  Other Topics Concern   Not on file  Social History Narrative   Graduated HS      Right handed      Lives with husband   Social Drivers of Health   Financial Resource Strain: Low Risk  (02/22/2022)   Overall Financial Resource Strain  (CARDIA)    Difficulty of Paying Living Expenses: Not hard at all  Food Insecurity: No Food Insecurity (11/25/2023)   Hunger Vital Sign    Worried About Running Out of Food in the Last Year: Never true    Ran Out of Food in the Last Year: Never true  Transportation Needs: No Transportation Needs (11/25/2023)   PRAPARE - Administrator, Civil Service (Medical): No    Lack of Transportation (Non-Medical): No  Physical Activity: Not on file  Stress: Not on file  Social Connections: Patient Declined (11/25/2023)   Social Connection and Isolation Panel [NHANES]    Frequency of Communication with Friends and Family: Patient declined    Frequency of Social Gatherings with Friends and Family: Patient declined    Attends Religious Services: Patient declined    Database administrator or Organizations: Patient declined    Attends Banker Meetings: Patient declined    Marital Status: Patient declined  Intimate Partner Violence: Not At Risk (11/25/2023)   Humiliation, Afraid, Rape, and Kick questionnaire    Fear of Current or Ex-Partner: No    Emotionally Abused: No    Physically Abused: No    Sexually Abused: No      Family History  Problem Relation Age of Onset   Lung cancer Mother 2       she smoked   Prostate cancer Brother 34   Breast cancer Cousin        paternal first cousin   Breast cancer Cousin        paternal first cousin   Liver disease Neg Hx    Esophageal cancer Neg Hx    Colon cancer Neg Hx     Vitals:   12/04/23 1443  BP: 120/80  Pulse: 81  SpO2: 98%  Weight: 71.2 kg (157 lb)     PHYSICAL EXAM: General:  Weak appearing. + cough No resp difficulty HEENT: normal Neck: supple. no JVD. Carotids 2+ bilat; no bruits. No lymphadenopathy or thryomegaly appreciated. Cor: PMI nondisplaced. Regular rate & rhythm. No rubs, gallops or murmurs. Lungs: coarse in RLL  Abdomen: soft, nontender, nondistended. No hepatosplenomegaly. No bruits or masses.  Good bowel sounds. Extremities: no cyanosis, clubbing, rash, edema Neuro: alert & orientedx3, cranial nerves grossly intact. moves all 4 extremities w/o difficulty. Affect pleasant    ASSESSMENT & PLAN:  1. Abnormal echocardiogram, potential herceptin cardiotoxicity - Echo 03/01/22: EF 60-65% G2DD GLs -18.6% - Echo 06/26/22: EF 55-60% GLS -14.1% - I have reviewed both her pre-chemo echocardiogram and her most recent echo side-by-side. Although EF on the most recent one  may be slightly less vigorous than her pre-chemo echo it is still well within the normal range. Strain imaging is not ideal due to poor endocardial tracking on most recent echo but also suggests a possible slight decrease in contractile force - Although changes are minor it does fit the time course for potential herceptin cardiotoxicity.  - Echo 09/21/22 EF 60-65% (completely normal)  - Echo 09/21/22 EF 60-65%  - Echo 02/12/23 EF 60-65% - Echo  05/29/23 EF 60-65% Personally reviewed -> ok to continue [name] - Echo today 12/04/23 EF 60-65% Personally reviewed - Continue losartan and Toprol. Off spiro with low BP - Will switch to q4 month echos while on Hercpetin  2. Left Breast Cancer, stage IV with bony mets - diagnosed 5/23. Stage 1A (ER/PR -, HER2+).  - s/p left mastectomy 7/23.  - s/p 8/23: Docetaxel + Trastuzumab + Pertuzumab (THP) q21d x 8 cycles / Trastuzumab + Pertuzumab q21d x 4 cycles  - s/pTrastuzumab IV (6) or SQ (600) D1 q21d X 11 Cycles  - Echo today EF 60-65% Continue [name]   3. Hypertension - Off spiro due to low BP with Covid - BP ok today. Will not restart   Arvilla Meres, MD  3:21 PM

## 2023-12-10 ENCOUNTER — Inpatient Hospital Stay: Attending: Hematology and Oncology

## 2023-12-10 VITALS — BP 142/78 | HR 73 | Temp 98.6°F | Resp 17 | Wt 156.0 lb

## 2023-12-10 DIAGNOSIS — Z9012 Acquired absence of left breast and nipple: Secondary | ICD-10-CM | POA: Insufficient documentation

## 2023-12-10 DIAGNOSIS — C50412 Malignant neoplasm of upper-outer quadrant of left female breast: Secondary | ICD-10-CM | POA: Insufficient documentation

## 2023-12-10 DIAGNOSIS — Z79899 Other long term (current) drug therapy: Secondary | ICD-10-CM | POA: Diagnosis not present

## 2023-12-10 DIAGNOSIS — Z8042 Family history of malignant neoplasm of prostate: Secondary | ICD-10-CM | POA: Insufficient documentation

## 2023-12-10 DIAGNOSIS — D352 Benign neoplasm of pituitary gland: Secondary | ICD-10-CM | POA: Insufficient documentation

## 2023-12-10 DIAGNOSIS — Z803 Family history of malignant neoplasm of breast: Secondary | ICD-10-CM | POA: Diagnosis not present

## 2023-12-10 DIAGNOSIS — Z5112 Encounter for antineoplastic immunotherapy: Secondary | ICD-10-CM | POA: Insufficient documentation

## 2023-12-10 DIAGNOSIS — C7951 Secondary malignant neoplasm of bone: Secondary | ICD-10-CM | POA: Diagnosis not present

## 2023-12-10 DIAGNOSIS — Z171 Estrogen receptor negative status [ER-]: Secondary | ICD-10-CM | POA: Insufficient documentation

## 2023-12-10 MED ORDER — SODIUM CHLORIDE 0.9% FLUSH
10.0000 mL | INTRAVENOUS | Status: DC | PRN
  Administered 2023-12-10: 10 mL

## 2023-12-10 MED ORDER — ACETAMINOPHEN 325 MG PO TABS
650.0000 mg | ORAL_TABLET | Freq: Once | ORAL | Status: AC
  Administered 2023-12-10: 650 mg via ORAL
  Filled 2023-12-10: qty 2

## 2023-12-10 MED ORDER — TRASTUZUMAB-DKST CHEMO 150 MG IV SOLR
6.0000 mg/kg | Freq: Once | INTRAVENOUS | Status: AC
  Administered 2023-12-10: 420 mg via INTRAVENOUS
  Filled 2023-12-10: qty 20

## 2023-12-10 MED ORDER — SODIUM CHLORIDE 0.9 % IV SOLN: Freq: Once | INTRAVENOUS | Status: AC

## 2023-12-10 MED ORDER — DIPHENHYDRAMINE HCL 25 MG PO CAPS
25.0000 mg | ORAL_CAPSULE | Freq: Once | ORAL | Status: AC
  Administered 2023-12-10: 25 mg via ORAL
  Filled 2023-12-10: qty 1

## 2023-12-10 MED ORDER — HEPARIN SOD (PORK) LOCK FLUSH 100 UNIT/ML IV SOLN
500.0000 [IU] | Freq: Once | INTRAVENOUS | Status: AC | PRN
  Administered 2023-12-10: 500 [IU]

## 2023-12-10 NOTE — Patient Instructions (Signed)
 [facility] - A DEPT OF [facility]  Discharge Instructions: Thank you for choosing [facility] to provide your oncology and hematology care.   If you have a lab appointment with the Cancer Center, please go directly to the Cancer Center and check in at the registration area.   Wear comfortable clothing and clothing appropriate for easy access to any Portacath or PICC line.   We strive to give you quality time with your provider. You may need to reschedule your appointment if you arrive late (15 or more minutes).  Arriving late affects you and other patients whose appointments are after yours.  Also, if you miss three or more appointments without notifying the office, you may be dismissed from the clinic at the provider's discretion.      For prescription refill requests, have your pharmacy contact our office and allow 72 hours for refills to be completed.    Today you received the following chemotherapy and/or immunotherapy agents: Ogivri      To help prevent nausea and vomiting after your treatment, we encourage you to take your nausea medication as directed.  BELOW ARE SYMPTOMS THAT SHOULD BE REPORTED IMMEDIATELY: *FEVER GREATER THAN 100.4 F (38 C) OR HIGHER *CHILLS OR SWEATING *NAUSEA AND VOMITING THAT IS NOT CONTROLLED WITH YOUR NAUSEA MEDICATION *UNUSUAL SHORTNESS OF BREATH *UNUSUAL BRUISING OR BLEEDING *URINARY PROBLEMS (pain or burning when urinating, or frequent urination) *BOWEL PROBLEMS (unusual diarrhea, constipation, pain near the anus) TENDERNESS IN MOUTH AND THROAT WITH OR WITHOUT PRESENCE OF ULCERS (sore throat, sores in mouth, or a toothache) UNUSUAL RASH, SWELLING OR PAIN  UNUSUAL VAGINAL DISCHARGE OR ITCHING   Items with * indicate a potential emergency and should be followed up as soon as possible or go to the Emergency Department if any problems should occur.  Please show the CHEMOTHERAPY ALERT CARD or IMMUNOTHERAPY  ALERT CARD at check-in to the Emergency Department and triage nurse.  Should you have questions after your visit or need to cancel or reschedule your appointment, please contact [facility] - A DEPT OF [facility]  Dept: [phone]  and follow the prompts.  Office hours are 8:00 a.m. to 4:30 p.m. Monday - Friday. Please note that voicemails left after 4:00 p.m. may not be returned until the following business day.  We are closed weekends and major holidays. You have access to a nurse at all times for urgent questions. Please call the main number to the clinic Dept: [phone] and follow the prompts.   For any non-urgent questions, you may also contact your provider using MyChart. We now offer e-Visits for anyone 65 and older to request care online for non-urgent symptoms. For details visit [email or web address].   Also download the MyChart app! Go to the app store, search "MyChart", open the app, select [place], and log in with your MyChart username and password.

## 2023-12-11 DIAGNOSIS — I10 Essential (primary) hypertension: Secondary | ICD-10-CM | POA: Diagnosis not present

## 2023-12-11 DIAGNOSIS — I427 Cardiomyopathy due to drug and external agent: Secondary | ICD-10-CM | POA: Diagnosis not present

## 2023-12-17 ENCOUNTER — Ambulatory Visit: Payer: Medicare Other

## 2023-12-17 ENCOUNTER — Ambulatory Visit: Payer: Medicare Other | Admitting: Hematology and Oncology

## 2023-12-19 ENCOUNTER — Telehealth: Payer: Self-pay | Admitting: Hematology and Oncology

## 2023-12-19 NOTE — Telephone Encounter (Signed)
 Provided Allison Pierce with updated appt details.

## 2023-12-20 DIAGNOSIS — E871 Hypo-osmolality and hyponatremia: Secondary | ICD-10-CM | POA: Diagnosis not present

## 2023-12-31 ENCOUNTER — Ambulatory Visit: Payer: Medicare Other

## 2023-12-31 ENCOUNTER — Ambulatory Visit: Payer: Medicare Other | Admitting: Hematology and Oncology

## 2023-12-31 DIAGNOSIS — F3341 Major depressive disorder, recurrent, in partial remission: Secondary | ICD-10-CM | POA: Diagnosis not present

## 2023-12-31 DIAGNOSIS — E782 Mixed hyperlipidemia: Secondary | ICD-10-CM | POA: Diagnosis not present

## 2023-12-31 DIAGNOSIS — I10 Essential (primary) hypertension: Secondary | ICD-10-CM | POA: Diagnosis not present

## 2023-12-31 DIAGNOSIS — I427 Cardiomyopathy due to drug and external agent: Secondary | ICD-10-CM | POA: Diagnosis not present

## 2024-01-07 ENCOUNTER — Ambulatory Visit: Payer: Medicare Other

## 2024-01-07 ENCOUNTER — Ambulatory Visit: Payer: Medicare Other | Admitting: Hematology and Oncology

## 2024-01-07 ENCOUNTER — Inpatient Hospital Stay: Attending: Hematology and Oncology | Admitting: Hematology and Oncology

## 2024-01-07 ENCOUNTER — Inpatient Hospital Stay

## 2024-01-07 ENCOUNTER — Other Ambulatory Visit: Payer: Self-pay | Admitting: Hematology and Oncology

## 2024-01-07 VITALS — BP 153/74 | HR 73 | Temp 98.0°F | Resp 18 | Ht 66.0 in | Wt 155.6 lb

## 2024-01-07 DIAGNOSIS — Z171 Estrogen receptor negative status [ER-]: Secondary | ICD-10-CM

## 2024-01-07 DIAGNOSIS — Z9012 Acquired absence of left breast and nipple: Secondary | ICD-10-CM | POA: Insufficient documentation

## 2024-01-07 DIAGNOSIS — C50412 Malignant neoplasm of upper-outer quadrant of left female breast: Secondary | ICD-10-CM | POA: Diagnosis not present

## 2024-01-07 DIAGNOSIS — Z8042 Family history of malignant neoplasm of prostate: Secondary | ICD-10-CM | POA: Diagnosis not present

## 2024-01-07 DIAGNOSIS — E237 Disorder of pituitary gland, unspecified: Secondary | ICD-10-CM | POA: Insufficient documentation

## 2024-01-07 DIAGNOSIS — C7951 Secondary malignant neoplasm of bone: Secondary | ICD-10-CM | POA: Diagnosis not present

## 2024-01-07 DIAGNOSIS — Z5112 Encounter for antineoplastic immunotherapy: Secondary | ICD-10-CM | POA: Insufficient documentation

## 2024-01-07 DIAGNOSIS — M545 Low back pain, unspecified: Secondary | ICD-10-CM | POA: Diagnosis not present

## 2024-01-07 DIAGNOSIS — Z95828 Presence of other vascular implants and grafts: Secondary | ICD-10-CM

## 2024-01-07 DIAGNOSIS — K5900 Constipation, unspecified: Secondary | ICD-10-CM | POA: Insufficient documentation

## 2024-01-07 DIAGNOSIS — Z803 Family history of malignant neoplasm of breast: Secondary | ICD-10-CM | POA: Insufficient documentation

## 2024-01-07 MED ORDER — SODIUM CHLORIDE 0.9 % IV SOLN: INTRAVENOUS | Status: DC

## 2024-01-07 MED ORDER — SODIUM CHLORIDE 0.9% FLUSH: 10.0000 mL | INTRAVENOUS | Status: DC | PRN

## 2024-01-07 MED ORDER — TRASTUZUMAB-DKST CHEMO 150 MG IV SOLR
6.0000 mg/kg | Freq: Once | INTRAVENOUS | Status: AC
  Administered 2024-01-07: 420 mg via INTRAVENOUS
  Filled 2024-01-07: qty 20

## 2024-01-07 MED ORDER — HEPARIN SOD (PORK) LOCK FLUSH 100 UNIT/ML IV SOLN: 500.0000 [IU] | Freq: Once | INTRAVENOUS | Status: DC | PRN

## 2024-01-07 MED ORDER — SODIUM CHLORIDE 0.9 % IV SOLN
Freq: Once | INTRAVENOUS | Status: AC
Start: 2024-01-07 — End: 2024-01-07

## 2024-01-07 MED ORDER — ZOLEDRONIC ACID 4 MG/100ML IV SOLN
4.0000 mg | Freq: Once | INTRAVENOUS | Status: AC
Start: 2024-01-07 — End: 2024-01-07
  Administered 2024-01-07: 4 mg via INTRAVENOUS
  Filled 2024-01-07: qty 100

## 2024-01-07 MED ORDER — DIPHENHYDRAMINE HCL 25 MG PO CAPS
25.0000 mg | ORAL_CAPSULE | Freq: Once | ORAL | Status: AC
  Administered 2024-01-07: 25 mg via ORAL
  Filled 2024-01-07: qty 1

## 2024-01-07 MED ORDER — ACETAMINOPHEN 325 MG PO TABS
650.0000 mg | ORAL_TABLET | Freq: Once | ORAL | Status: AC
  Administered 2024-01-07: 650 mg via ORAL
  Filled 2024-01-07: qty 2

## 2024-01-07 NOTE — Patient Instructions (Addendum)
 [facility] - A DEPT OF [facility]  Discharge Instructions: Thank you for choosing [facility] to provide your oncology and hematology care.   If you have a lab appointment with the Cancer Center, please go directly to the Cancer Center and check in at the registration area.   Wear comfortable clothing and clothing appropriate for easy access to any Portacath or PICC line.   We strive to give you quality time with your provider. You may need to reschedule your appointment if you arrive late (15 or more minutes).  Arriving late affects you and other patients whose appointments are after yours.  Also, if you miss three or more appointments without notifying the office, you may be dismissed from the clinic at the provider's discretion.      For prescription refill requests, have your pharmacy contact our office and allow 72 hours for refills to be completed.    Today you received the following chemotherapy and/or immunotherapy agents: Ogivri      To help prevent nausea and vomiting after your treatment, we encourage you to take your nausea medication as directed.  BELOW ARE SYMPTOMS THAT SHOULD BE REPORTED IMMEDIATELY: *FEVER GREATER THAN 100.4 F (38 C) OR HIGHER *CHILLS OR SWEATING *NAUSEA AND VOMITING THAT IS NOT CONTROLLED WITH YOUR NAUSEA MEDICATION *UNUSUAL SHORTNESS OF BREATH *UNUSUAL BRUISING OR BLEEDING *URINARY PROBLEMS (pain or burning when urinating, or frequent urination) *BOWEL PROBLEMS (unusual diarrhea, constipation, pain near the anus) TENDERNESS IN MOUTH AND THROAT WITH OR WITHOUT PRESENCE OF ULCERS (sore throat, sores in mouth, or a toothache) UNUSUAL RASH, SWELLING OR PAIN  UNUSUAL VAGINAL DISCHARGE OR ITCHING   Items with * indicate a potential emergency and should be followed up as soon as possible or go to the Emergency Department if any problems should occur.  Please show the CHEMOTHERAPY ALERT CARD or IMMUNOTHERAPY  ALERT CARD at check-in to the Emergency Department and triage nurse.  Should you have questions after your visit or need to cancel or reschedule your appointment, please contact [facility] - A DEPT OF [facility]  Dept: [phone]  and follow the prompts.  Office hours are 8:00 a.m. to 4:30 p.m. Monday - Friday. Please note that voicemails left after 4:00 p.m. may not be returned until the following business day.  We are closed weekends and major holidays. You have access to a nurse at all times for urgent questions. Please call the main number to the clinic Dept: [phone] and follow the prompts.   For any non-urgent questions, you may also contact your provider using MyChart. We now offer e-Visits for anyone 44 and older to request care online for non-urgent symptoms. For details visit [email or web address].   Also download the MyChart app! Go to the app store, search "MyChart", open the app, select [place], and log in with your MyChart username and password.  Zoledronic Acid Injection (Cancer) What is this medication? ZOLEDRONIC ACID (ZOE le dron ik AS id) treats high calcium levels in the blood caused by cancer. It may also be used with chemotherapy to treat weakened bones caused by cancer. It works by slowing down the release of calcium from bones. This lowers calcium levels in your blood. It also makes your bones stronger and less likely to break (fracture). It belongs to a group of medications called bisphosphonates. This medicine may be used for other purposes; ask your health care provider  or pharmacist if you have questions. COMMON BRAND NAME(S): Zometa, Zometa Powder What should I tell my care team before I take this medication? They need to know if you have any of these conditions: Dehydration Dental disease Kidney disease Liver disease Low levels of calcium in the blood Lung or breathing disease, such as asthma Receiving steroids, such as  dexamethasone or prednisone An unusual or allergic reaction to zoledronic acid, other medications, foods, dyes, or preservatives Pregnant or trying to get pregnant Breast-feeding How should I use this medication? This medication is injected into a vein. It is given by your care team in a hospital or clinic setting. Talk to your care team about the use of this medication in children. Special care may be needed. Overdosage: If you think you have taken too much of this medicine contact a poison control center or emergency room at once. NOTE: This medicine is only for you. Do not share this medicine with others. What if I miss a dose? Keep appointments for follow-up doses. It is important not to miss your dose. Call your care team if you are unable to keep an appointment. What may interact with this medication? Certain antibiotics given by injection Diuretics, such as bumetanide, furosemide NSAIDs, medications for pain and inflammation, such as ibuprofen or naproxen Teriparatide Thalidomide This list may not describe all possible interactions. Give your health care provider a list of all the medicines, herbs, non-prescription drugs, or dietary supplements you use. Also tell them if you smoke, drink alcohol, or use illegal drugs. Some items may interact with your medicine. What should I watch for while using this medication? Visit your care team for regular checks on your progress. It may be some time before you see the benefit from this medication. Some people who take this medication have severe bone, joint, or muscle pain. This medication may also increase your risk for jaw problems or a broken thigh bone. Tell your care team right away if you have severe pain in your jaw, bones, joints, or muscles. Tell you care team if you have any pain that does not go away or that gets worse. Tell your dentist and dental surgeon that you are taking this medication. You should not have major dental surgery  while on this medication. See your dentist to have a dental exam and fix any dental problems before starting this medication. Take good care of your teeth while on this medication. Make sure you see your dentist for regular follow-up appointments. You should make sure you get enough calcium and vitamin D while you are taking this medication. Discuss the foods you eat and the vitamins you take with your care team. Check with your care team if you have severe diarrhea, nausea, and vomiting, or if you sweat a lot. The loss of too much body fluid may make it dangerous for you to take this medication. You may need bloodwork while taking this medication. Talk to your care team if you wish to become pregnant or think you might be pregnant. This medication can cause serious birth defects. What side effects may I notice from receiving this medication? Side effects that you should report to your care team as soon as possible: Allergic reactions--skin rash, itching, hives, swelling of the face, lips, tongue, or throat Kidney injury--decrease in the amount of urine, swelling of the ankles, hands, or feet Low calcium level--muscle pain or cramps, confusion, tingling, or numbness in the hands or feet Osteonecrosis of the jaw--pain, swelling, or redness  in the mouth, numbness of the jaw, poor healing after dental work, unusual discharge from the mouth, visible bones in the mouth Severe bone, joint, or muscle pain Side effects that usually do not require medical attention (report to your care team if they continue or are bothersome): Constipation Fatigue Fever Loss of appetite Nausea Stomach pain This list may not describe all possible side effects. Call your doctor for medical advice about side effects. You may report side effects to FDA at 1-800-FDA-1088. Where should I keep my medication? This medication is given in a hospital or clinic. It will not be stored at home. NOTE: This sheet is a summary. It may  not cover all possible information. If you have questions about this medicine, talk to your doctor, pharmacist, or health care provider.  2024 Elsevier/Gold Standard (2021-11-11 00:00:00)

## 2024-01-07 NOTE — Progress Notes (Signed)
 Ok to proceed with Zometa today using CMP results from 2/27 per Dr. Pamelia Hoit.   Drusilla Kanner, PharmD, MBA

## 2024-01-07 NOTE — Assessment & Plan Note (Signed)
 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  T4 vertebral body Bone scan 04/25/2022: Abnormal uptake in the sternum, right parietal region of calvarium, right posterior seventh rib, T5, left sternoclavicular joint 07/30/2022: Bone scan: Interval resolution/decreasing accumulation of multiple bone metastases 10/30/2022: CT CAP: Mildly prominent mediastinal lymph nodes are similar, no pulmonary metastasis, skeletal lesions increasing sclerosis, no visceral metastases 01/16/2023: CT CAP: Stable prominent mediastinal lymph nodes, slight increase in the small nodes in the mesentery could be reactive, scattered bone metastases stable 05/16/2023: CT CAP: Stable bone metastases, unchanged pretracheal lymph nodes.  Previous mesenteric lymph nodes not conspicuous.  Diffuse colonic mucosal thickening: Colitis   Treatment: Palliative chemotherapy with Taxotere [name] and Perjeta every 3 weeks x1 cycle given 05/05/2022 (discontinued for severe toxicities and hospitalization)   ---------------------------------------------------------------------------------------------------------------------------------------------- Chemotoxicities: Hospitalization 05/11/2022-05/18/2022: Convulsive syncope, chemo induced neutropenia, oral mucositis, chronic CHF, severe diarrhea with dehydration ECHO 06/26/22: EF 55-60% (follows with Bensimhon) Hospitalization 11/18/2023-11/23/2023: COVID-19, hyponatremia and metabolic acidosis   Treatment EVOJ:5/00/93 [name] maintenance.  (Added Perjeta 06/21/22-discontinued 06/2023 secondary to colitis) CT CAP 05/16/2023: Bone metastases unchanged, unchanged pretracheal lymph nodes, colitis CT CAP 09/17/2023: Unchanged bone  metastases.   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 Barbaraann Cao.  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 We will change [name] to every 4 weeks.

## 2024-01-07 NOTE — Progress Notes (Signed)
 Patient Care Team: Lupita Raider, MD as PCP - General (Family Medicine) Van Clines, MD as Consulting Physician (Neurology) Pershing Proud, RN as Oncology Nurse Navigator Donnelly Angelica, RN as Oncology Nurse Navigator Abigail Miyamoto, MD as Consulting Physician (General Surgery) Serena Croissant, MD as Consulting Physician (Hematology and Oncology) Lonie Peak, MD as Attending Physician (Radiation Oncology)  DIAGNOSIS:  Encounter Diagnosis  Name Primary?   Malignant neoplasm of upper-outer quadrant of left breast in female, estrogen receptor negative (HCC) Yes    SUMMARY OF ONCOLOGIC HISTORY: Oncology History  Malignant neoplasm of upper-outer quadrant of left breast in female, estrogen receptor negative (HCC)  02/10/2022 Initial Diagnosis   Palpable left breast masses and calcifications: 1.8 cm at 1:00 and 1.7 cm at 9:00, calcifications not biopsy.  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 Report date is 03/05/2022.  The CustomNext-Cancer+RNAinsight panel offered by [facility] includes sequencing and rearrangement analysis for the following 48 genes:  APC, ATM, AXIN2, BARD1, BMPR1A, BRCA1, BRCA2, BRIP1, CDH1, CDK4, CDKN2A, CHEK2, CTNNA1, DICER1, EGFR, EPCAM, GREM1, HOXB13, KIT, MEN1, MLH1, MSH2, MSH3, MSH6, MUTYH, NBN, NF1, NTHL1, PALB2, PDGFRA, PMS2, POLD1, POLE, PTEN, RAD50, RAD51C, RAD51D, SDHA, SDHB, SDHC, SDHD, SMAD4, SMARCA4, STK11, TP53, TSC1, TSC2, and VHL.  RNA data is routinely analyzed for use in variant interpretation for all genes.   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, LCIS, 5/5 lymph nodes positive with extranodal extension, margins negative ER 0%, PR 0%, HER2 3+, Ki-67 20%   04/27/2022 Cancer Staging   Staging form: Breast, AJCC 8th Edition - Pathologic: Stage IIIA (pT2, pN2, cM0, G2, ER-, PR-, HER2+) - Signed by Serena Croissant, MD on 04/27/2022 Histologic grading system: 3 grade system   05/05/2022 - 05/26/2022 Chemotherapy   Patient is on Treatment Plan : BREAST DOCEtaxel + Trastuzumab + Pertuzumab (THP) q21d x 8 cycles / Trastuzumab + Pertuzumab q21d x 4 cycles     05/26/2022 -  Chemotherapy   Patient is on Treatment Plan : BREAST MAINTENANCE Trastuzumab IV (6) or SQ (600) D1 q21d X 11 Cycles     06/12/2023 Cancer Staging   Staging form: Breast, AJCC 8th Edition - Pathologic: Stage IV (cM1) - Signed by [name], NP on 06/12/2023     CHIEF COMPLIANT: Follow-up on [name], complaining of new onset of low back pain  HISTORY OF PRESENT ILLNESS: History of Present Illness The patient, with a history of cancer, presents with back pain that is particularly noticeable when rising from a soft chair or upon waking in the morning. The pain is associated with stiffness and instability, particularly when getting up to use the bathroom during the night. The patient also reports constipation, which she manages with over-the-counter remedies as needed. The patient's last scan was four months ago and showed no concerning findings. The patient also has a history of colon issues, including pockets that have previously become infected, but the current back pain does not seem to be related to this issue. The patient is scheduled for a CT scan in three  weeks.     ALLERGIES:  is allergic to oxybutynin, prozac [fluoxetine hcl], ritalin [methylphenidate], rosuvastatin, and lyrica [pregabalin].  MEDICATIONS:  Current Outpatient Medications  Medication Sig Dispense Refill   buPROPion (WELLBUTRIN  XL) 300 MG 24 hr tablet Take 300 mg by mouth in the morning.     clonazePAM (KLONOPIN) 1 MG tablet Take 1 mg by mouth at bedtime.     diphenhydrAMINE (BENADRYL) 25 MG tablet Take 25 mg by mouth in the morning and at bedtime.     fluticasone (FLONASE) 50 MCG/ACT nasal spray Place 1 spray into both nostrils daily as needed for allergies.     promethazine-dextromethorphan (PROMETHAZINE-DM) 6.25-15 MG/5ML syrup Take 5 mLs by mouth 4 (four) times daily as needed for cough. 180 mL 0   traZODone (DESYREL) 150 MG tablet Take 225 mg by mouth at bedtime.     venlafaxine XR (EFFEXOR-XR) 75 MG 24 hr capsule Take 75 mg by mouth daily with breakfast.     gabapentin (NEURONTIN) 100 MG capsule Take 1 capsule (100 mg total) by mouth at bedtime. (Patient not taking: Reported on 12/04/2023) 14 capsule 0   simvastatin (ZOCOR) 40 MG tablet Take by mouth. (Patient not taking: Reported on 11/21/2023)     No current facility-administered medications for this visit.    PHYSICAL EXAMINATION: ECOG PERFORMANCE STATUS: 1 - Symptomatic but completely ambulatory  Vitals:   01/07/24 1438  BP: (!) 153/74  Pulse: 73  Resp: 18  Temp: 98 F (36.7 C)  SpO2: 100%   Filed Weights   01/07/24 1438  Weight: 155 lb 9.6 oz (70.6 kg)   LABORATORY DATA:  I have reviewed the data as listed    Latest Ref Rng & Units 11/29/2023   12:16 PM 11/26/2023    3:39 AM 11/25/2023   11:55 AM  CMP  Glucose 70 - 99 mg/dL 97  161  096   BUN 8 - 23 mg/dL 13  11  14    Creatinine 0.44 - 1.00 mg/dL 0.45  4.09  8.11   Sodium 135 - 145 mmol/L 133  137  134   Potassium 3.5 - 5.1 mmol/L 4.5  3.9  4.0   Chloride 98 - 111 mmol/L 100  104  98   CO2 22 - 32 mmol/L 29  24  28    Calcium 8.9 - 10.3 mg/dL 9.1  8.5  9.0   Total Protein 6.5 - 8.1 g/dL 6.8   6.1   Total Bilirubin 0.0 - 1.2 mg/dL 0.4   0.7   Alkaline Phos 38 - 126 U/L 78   59   AST 15 - 41 U/L 23   23   ALT 0 - 44 U/L 30   27     Lab Results  Component Value Date   WBC 10.1  11/29/2023   HGB 11.3 (L) 11/29/2023   HCT 34.8 (L) 11/29/2023   MCV 84.5 11/29/2023   PLT 345 11/29/2023   NEUTROABS 7.9 (H) 11/29/2023    ASSESSMENT & PLAN:  Malignant neoplasm of upper-outer quadrant of left breast in female, estrogen receptor negative (HCC) 04/07/2022:Left mastectomy: 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  T4 vertebral body Bone scan 04/25/2022: Abnormal uptake in the sternum, right parietal region of calvarium, right posterior seventh rib, T5,  left sternoclavicular joint 07/30/2022: Bone scan: Interval resolution/decreasing accumulation of multiple bone metastases 10/30/2022: CT CAP: Mildly prominent mediastinal lymph nodes are similar, no pulmonary metastasis, skeletal lesions increasing sclerosis, no visceral metastases 01/16/2023: CT CAP: Stable prominent mediastinal lymph nodes, slight increase in the small nodes in the mesentery could be reactive, scattered bone metastases stable 05/16/2023: CT CAP: Stable bone metastases, unchanged pretracheal lymph nodes.  Previous mesenteric lymph nodes not conspicuous.  Diffuse colonic mucosal thickening: Colitis   Treatment: Palliative chemotherapy with Taxotere [name] and Perjeta every 3 weeks x1 cycle given 05/05/2022 (discontinued for severe toxicities and hospitalization)   ---------------------------------------------------------------------------------------------------------------------------------------------- Chemotoxicities: Hospitalization 05/11/2022-05/18/2022: Convulsive syncope, chemo induced neutropenia, oral mucositis, chronic CHF, severe diarrhea with dehydration ECHO 06/26/22: EF 55-60% (follows with Bensimhon) Hospitalization 11/18/2023-11/23/2023: COVID-19, hyponatremia  and metabolic acidosis   Treatment ZOXW:9/60/45 [name] maintenance.  (Added Perjeta 06/21/22-discontinued 06/2023 secondary to colitis) CT CAP 05/16/2023: Bone metastases unchanged, unchanged pretracheal lymph nodes, colitis CT CAP 09/17/2023: Unchanged bone metastases.   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 Barbaraann Cao.  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 No disease progression on recent echocardiogram. - Order CT scan of the pelvis in three weeks, prior to the next treatment.  Back Pain Chronic with stiffness and instability. Possible causes include constipation-related pain, arthritis, or muscular tightness. - Advise use of heating pad for back pain relief.  Constipation Likely secondary to medication, contributing to back pain. Difficulty with bowel movements. - Recommend daily use of Senna as a stool softener. - Advise use of Miralax if Senna is ineffective.      Orders Placed This Encounter  Procedures   CT CHEST ABDOMEN PELVIS W CONTRAST    Standing Status:   Future    Expected Date:   01/28/2024    Expiration Date:   01/06/2025    If indicated for the ordered procedure, I authorize the administration of contrast media per Radiology protocol:   Yes    Does the patient have a contrast media/X-ray dye allergy?:   No    Preferred imaging location?:   [facility]    Release to patient:   Immediate    If indicated for the ordered procedure, I authorize the administration of oral contrast media per Radiology protocol:   Yes   The patient has a good understanding of the overall plan. she agrees  with it. she will call with any problems that may develop before the next visit here. Total time spent: 30 mins including face to face time and time spent for planning, charting and co-ordination of care   Tamsen Meek, MD 01/07/24

## 2024-01-10 ENCOUNTER — Other Ambulatory Visit: Payer: Self-pay | Admitting: Family Medicine

## 2024-01-10 ENCOUNTER — Ambulatory Visit
Admission: RE | Admit: 2024-01-10 | Discharge: 2024-01-10 | Disposition: A | Discharge status: Home / Self Care | Source: Ambulatory Visit | Attending: Family Medicine | Admitting: Family Medicine

## 2024-01-10 DIAGNOSIS — R509 Fever, unspecified: Secondary | ICD-10-CM | POA: Diagnosis not present

## 2024-01-10 DIAGNOSIS — M5459 Other low back pain: Secondary | ICD-10-CM

## 2024-01-10 DIAGNOSIS — M545 Low back pain, unspecified: Secondary | ICD-10-CM | POA: Diagnosis not present

## 2024-01-22 ENCOUNTER — Other Ambulatory Visit: Payer: Self-pay | Admitting: Family Medicine

## 2024-01-22 DIAGNOSIS — S32000A Wedge compression fracture of unspecified lumbar vertebra, initial encounter for closed fracture: Secondary | ICD-10-CM

## 2024-01-28 ENCOUNTER — Ambulatory Visit (HOSPITAL_COMMUNITY)
Admission: RE | Admit: 2024-01-28 | Discharge: 2024-01-28 | Disposition: A | Discharge status: Home / Self Care | Source: Ambulatory Visit | Attending: Hematology and Oncology | Admitting: Hematology and Oncology

## 2024-01-28 ENCOUNTER — Ambulatory Visit: Payer: Medicare Other

## 2024-01-28 ENCOUNTER — Encounter (HOSPITAL_COMMUNITY): Payer: Self-pay

## 2024-01-28 ENCOUNTER — Ambulatory Visit: Payer: Medicare Other | Admitting: Hematology and Oncology

## 2024-01-28 DIAGNOSIS — Z171 Estrogen receptor negative status [ER-]: Secondary | ICD-10-CM | POA: Insufficient documentation

## 2024-01-28 DIAGNOSIS — C7951 Secondary malignant neoplasm of bone: Secondary | ICD-10-CM | POA: Diagnosis not present

## 2024-01-28 DIAGNOSIS — I700 Atherosclerosis of aorta: Secondary | ICD-10-CM | POA: Diagnosis not present

## 2024-01-28 DIAGNOSIS — N179 Acute kidney failure, unspecified: Secondary | ICD-10-CM | POA: Insufficient documentation

## 2024-01-28 DIAGNOSIS — C50412 Malignant neoplasm of upper-outer quadrant of left female breast: Secondary | ICD-10-CM | POA: Insufficient documentation

## 2024-01-28 LAB — POCT I-STAT CREATININE: Creatinine, Ser: 1.1 mg/dL — ABNORMAL HIGH (ref 0.44–1.00)

## 2024-01-28 MED ORDER — SODIUM CHLORIDE (PF) 0.9 % IJ SOLN
INTRAMUSCULAR | Status: AC
  Filled 2024-01-28: qty 50

## 2024-01-28 MED ORDER — IOHEXOL 300 MG/ML  SOLN
100.0000 mL | Freq: Once | INTRAMUSCULAR | Status: AC | PRN
  Administered 2024-01-28: 100 mL via INTRAVENOUS

## 2024-01-30 DIAGNOSIS — F3341 Major depressive disorder, recurrent, in partial remission: Secondary | ICD-10-CM | POA: Diagnosis not present

## 2024-01-30 DIAGNOSIS — I10 Essential (primary) hypertension: Secondary | ICD-10-CM | POA: Diagnosis not present

## 2024-01-30 DIAGNOSIS — I427 Cardiomyopathy due to drug and external agent: Secondary | ICD-10-CM | POA: Diagnosis not present

## 2024-01-30 DIAGNOSIS — E782 Mixed hyperlipidemia: Secondary | ICD-10-CM | POA: Diagnosis not present

## 2024-02-03 ENCOUNTER — Other Ambulatory Visit

## 2024-02-04 ENCOUNTER — Inpatient Hospital Stay

## 2024-02-04 ENCOUNTER — Inpatient Hospital Stay: Attending: Hematology and Oncology | Admitting: Hematology and Oncology

## 2024-02-04 VITALS — BP 154/72 | HR 82 | Temp 98.1°F | Resp 18 | Wt 155.8 lb

## 2024-02-04 DIAGNOSIS — C50412 Malignant neoplasm of upper-outer quadrant of left female breast: Secondary | ICD-10-CM | POA: Insufficient documentation

## 2024-02-04 DIAGNOSIS — M545 Low back pain, unspecified: Secondary | ICD-10-CM | POA: Diagnosis not present

## 2024-02-04 DIAGNOSIS — Z8042 Family history of malignant neoplasm of prostate: Secondary | ICD-10-CM | POA: Insufficient documentation

## 2024-02-04 DIAGNOSIS — Z5112 Encounter for antineoplastic immunotherapy: Secondary | ICD-10-CM | POA: Diagnosis not present

## 2024-02-04 DIAGNOSIS — Z9012 Acquired absence of left breast and nipple: Secondary | ICD-10-CM | POA: Insufficient documentation

## 2024-02-04 DIAGNOSIS — C7951 Secondary malignant neoplasm of bone: Secondary | ICD-10-CM | POA: Insufficient documentation

## 2024-02-04 DIAGNOSIS — Z171 Estrogen receptor negative status [ER-]: Secondary | ICD-10-CM | POA: Diagnosis not present

## 2024-02-04 DIAGNOSIS — Z803 Family history of malignant neoplasm of breast: Secondary | ICD-10-CM | POA: Insufficient documentation

## 2024-02-04 DIAGNOSIS — K5909 Other constipation: Secondary | ICD-10-CM | POA: Diagnosis not present

## 2024-02-04 DIAGNOSIS — E237 Disorder of pituitary gland, unspecified: Secondary | ICD-10-CM | POA: Insufficient documentation

## 2024-02-04 MED ORDER — HEPARIN SOD (PORK) LOCK FLUSH 100 UNIT/ML IV SOLN
500.0000 [IU] | Freq: Once | INTRAVENOUS | Status: AC | PRN
  Administered 2024-02-04: 500 [IU]

## 2024-02-04 MED ORDER — SODIUM CHLORIDE 0.9% FLUSH
10.0000 mL | INTRAVENOUS | Status: DC | PRN
  Administered 2024-02-04: 10 mL

## 2024-02-04 MED ORDER — SODIUM CHLORIDE 0.9 % IV SOLN: Freq: Once | INTRAVENOUS | Status: AC

## 2024-02-04 MED ORDER — TRASTUZUMAB-DKST CHEMO 150 MG IV SOLR
6.0000 mg/kg | Freq: Once | INTRAVENOUS | Status: AC
  Administered 2024-02-04: 420 mg via INTRAVENOUS
  Filled 2024-02-04: qty 20

## 2024-02-04 MED ORDER — DIPHENHYDRAMINE HCL 25 MG PO CAPS
25.0000 mg | ORAL_CAPSULE | Freq: Once | ORAL | Status: AC
  Administered 2024-02-04: 25 mg via ORAL
  Filled 2024-02-04: qty 1

## 2024-02-04 MED ORDER — BISACODYL 5 MG PO TBEC: 5.0000 mg | DELAYED_RELEASE_TABLET | Freq: Every day | ORAL | 3 refills | Status: DC | PRN

## 2024-02-04 MED ORDER — ACETAMINOPHEN 325 MG PO TABS
650.0000 mg | ORAL_TABLET | Freq: Once | ORAL | Status: AC
  Administered 2024-02-04: 650 mg via ORAL
  Filled 2024-02-04: qty 2

## 2024-02-04 NOTE — Assessment & Plan Note (Signed)
 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  T4 vertebral body Bone scan 04/25/2022: Abnormal uptake in the sternum, right parietal region of calvarium, right posterior seventh rib, T5, left sternoclavicular joint 07/30/2022: Bone scan: Interval resolution/decreasing accumulation of multiple bone metastases 10/30/2022: CT CAP: Mildly prominent mediastinal lymph nodes are similar, no pulmonary metastasis, skeletal lesions increasing sclerosis, no visceral metastases 01/16/2023: CT CAP: Stable prominent mediastinal lymph nodes, slight increase in the small nodes in the mesentery could be reactive, scattered bone metastases stable 05/16/2023: CT CAP: Stable bone metastases, unchanged pretracheal lymph nodes.  Previous mesenteric lymph nodes not conspicuous.  Diffuse colonic mucosal thickening: Colitis   Treatment: Palliative chemotherapy with Taxotere  [name]  and Perjeta  every 3 weeks x1 cycle given 05/05/2022 (discontinued for severe toxicities and hospitalization)   ---------------------------------------------------------------------------------------------------------------------------------------------- Chemotoxicities: Hospitalization 05/11/2022-05/18/2022: Convulsive syncope, chemo induced neutropenia, oral mucositis, chronic CHF, severe diarrhea with dehydration ECHO 06/26/22: EF 55-60% (follows with Bensimhon) Hospitalization 11/18/2023-11/23/2023: COVID-19, hyponatremia and metabolic acidosis   Treatment ZOXW:9/60/45 [name]  maintenance.  (Added Perjeta  06/21/22-discontinued 06/2023 secondary to colitis) CT CAP 05/16/2023: Bone metastases unchanged, unchanged pretracheal lymph nodes, colitis CT CAP 09/17/2023: Unchanged bone  metastases. 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 Mark Sil.  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  Continue with the current treatment.

## 2024-02-04 NOTE — Progress Notes (Signed)
 Patient Care Team: Glena Landau, MD as PCP - General (Family Medicine) Jhonny Moss, MD as Consulting Physician (Neurology) Auther Bo, RN as Oncology Nurse Navigator Alane Hsu, RN as Oncology Nurse Navigator Oza Blumenthal, MD as Consulting Physician (General Surgery) Cameron Cea, MD as Consulting Physician (Hematology and Oncology) Colie Dawes, MD as Attending Physician (Radiation Oncology)  DIAGNOSIS:  Encounter Diagnosis  Name Primary?   Malignant neoplasm of upper-outer quadrant of left breast in female, estrogen receptor negative (HCC) Yes    SUMMARY OF ONCOLOGIC HISTORY: Oncology History  Malignant neoplasm of upper-outer quadrant of left breast in female, estrogen receptor negative (HCC)  02/10/2022 Initial Diagnosis   Palpable left breast masses and calcifications: 1.8 cm at 1:00 and 1.7 cm at 9:00, calcifications not biopsy.  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 Report date is 03/05/2022.  The CustomNext-Cancer+RNAinsight panel offered by [facility] includes sequencing and rearrangement analysis for the following 48 genes:  APC, ATM, AXIN2, BARD1, BMPR1A, BRCA1, BRCA2, BRIP1, CDH1, CDK4, CDKN2A, CHEK2, CTNNA1, DICER1, EGFR, EPCAM, GREM1, HOXB13, KIT, MEN1, MLH1, MSH2, MSH3, MSH6, MUTYH, NBN, NF1, NTHL1, PALB2, PDGFRA, PMS2, POLD1, POLE, PTEN, RAD50, RAD51C, RAD51D, SDHA, SDHB, SDHC, SDHD, SMAD4, SMARCA4, STK11, TP53, TSC1, TSC2, and VHL.  RNA data is routinely analyzed for use in variant interpretation for all genes.   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, LCIS, 5/5 lymph nodes positive with extranodal extension, margins negative ER 0%, PR 0%, HER2 3+, Ki-67 20%   04/27/2022 Cancer Staging   Staging form: Breast, AJCC 8th Edition - Pathologic: Stage IIIA (pT2, pN2, cM0, G2, ER-, PR-, HER2+) - Signed by Cameron Cea, MD on 04/27/2022 Histologic grading system: 3 grade system   05/05/2022 - 05/26/2022 Chemotherapy   Patient is on Treatment Plan : BREAST DOCEtaxel  + Trastuzumab  + Pertuzumab  (THP) q21d x 8 cycles / Trastuzumab  + Pertuzumab  q21d x 4 cycles     05/26/2022 -  Chemotherapy   Patient is on Treatment Plan : BREAST MAINTENANCE Trastuzumab  IV (6) or SQ (600) D1 q21d X 11 Cycles     06/12/2023 Cancer Staging   Staging form: Breast, AJCC 8th Edition - Pathologic: Stage IV (cM1) - Signed by Percival Brace, NP on 06/12/2023     CHIEF COMPLIANT: Follow-up after recent scans for metastatic breast cancer  HISTORY OF PRESENT ILLNESS:   History of Present Illness Allison Pierce is a 78 year old female with a history of breast cancer who presents for follow-up after a CT scan.  She experiences dizziness following her recent CT scan, which she attributes to the dye used during the procedure. She recalls a previous adverse reaction to dye several years ago. She describes an episode of nearly passing out, feeling very hot, and needing to lie down. She did not lose consciousness but felt dizzy and overheated. She experiences night sweats and uses a cold rag to cool down. No syncope is reported, but dizziness and heat intolerance are present.     ALLERGIES:  is allergic to oxybutynin, prozac [  fluoxetine hcl], ritalin [methylphenidate], rosuvastatin, and lyrica [pregabalin].  MEDICATIONS:  Current Outpatient Medications  Medication Sig Dispense Refill   buPROPion  (WELLBUTRIN  XL) 300 MG 24 hr tablet Take 300 mg by mouth in the morning.     clonazePAM  (KLONOPIN ) 1 MG  tablet Take 1 mg by mouth at bedtime.     diphenhydrAMINE  (BENADRYL ) 25 MG tablet Take 25 mg by mouth in the morning and at bedtime.     fluticasone  (FLONASE ) 50 MCG/ACT nasal spray Place 1 spray into both nostrils daily as needed for allergies.     gabapentin  (NEURONTIN ) 100 MG capsule Take 1 capsule (100 mg total) by mouth at bedtime. (Patient not taking: Reported on 12/04/2023) 14 capsule 0   promethazine -dextromethorphan  (PROMETHAZINE -DM) 6.25-15 MG/5ML syrup Take 5 mLs by mouth 4 (four) times daily as needed for cough. 180 mL 0   simvastatin  (ZOCOR ) 40 MG tablet Take by mouth. (Patient not taking: Reported on 11/21/2023)     traZODone  (DESYREL ) 150 MG tablet Take 225 mg by mouth at bedtime.     venlafaxine  XR (EFFEXOR -XR) 75 MG 24 hr capsule Take 75 mg by mouth daily with breakfast.     No current facility-administered medications for this visit.    PHYSICAL EXAMINATION: ECOG PERFORMANCE STATUS: 1 - Symptomatic but completely ambulatory  Vitals:   02/04/24 1451  BP: (!) 154/72  Pulse: 82  Resp: 18  Temp: 98.1 F (36.7 C)  SpO2: 100%   Filed Weights   02/04/24 1451  Weight: 155 lb 12.8 oz (70.7 kg)    LABORATORY DATA:  I have reviewed the data as listed    Latest Ref Rng & Units 01/28/2024    2:35 PM 11/29/2023   12:16 PM 11/26/2023    3:39 AM  CMP  Glucose 70 - 99 mg/dL  97  161   BUN 8 - 23 mg/dL  13  11   Creatinine 0.96 - 1.00 mg/dL 0.45  4.09  8.11   Sodium 135 - 145 mmol/L  133  137   Potassium 3.5 - 5.1 mmol/L  4.5  3.9   Chloride 98 - 111 mmol/L  100  104   CO2 22 - 32 mmol/L  29  24   Calcium 8.9 - 10.3 mg/dL  9.1  8.5   Total Protein 6.5 - 8.1 g/dL  6.8    Total Bilirubin 0.0 - 1.2 mg/dL  0.4    Alkaline Phos 38 - 126 U/L  78    AST 15 - 41 U/L  23    ALT 0 - 44 U/L  30      Lab Results  Component Value Date   WBC 10.1 11/29/2023   HGB 11.3 (L) 11/29/2023   HCT 34.8 (L) 11/29/2023   MCV 84.5 11/29/2023   PLT 345 11/29/2023   NEUTROABS 7.9 (H)  11/29/2023    ASSESSMENT & PLAN:  Malignant neoplasm of upper-outer quadrant of left breast in female, estrogen receptor negative (HCC) 04/07/2022:Left mastectomy: 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  T4 vertebral body Bone scan 04/25/2022: Abnormal uptake in the sternum, right parietal region of calvarium, right posterior seventh rib, T5, left sternoclavicular joint 07/30/2022: Bone scan: Interval resolution/decreasing accumulation of multiple bone metastases 10/30/2022: CT CAP: Mildly prominent mediastinal lymph nodes are  similar, no pulmonary metastasis, skeletal lesions increasing sclerosis, no visceral metastases 01/16/2023: CT CAP: Stable prominent mediastinal lymph nodes, slight increase in the small nodes in the mesentery could be reactive, scattered bone metastases stable 05/16/2023: CT CAP: Stable bone metastases, unchanged pretracheal lymph nodes.  Previous mesenteric lymph nodes not conspicuous.  Diffuse colonic mucosal thickening: Colitis   Treatment: Palliative chemotherapy with Taxotere  [name]  and Perjeta  every 3 weeks x1 cycle given 05/05/2022 (discontinued for severe toxicities and hospitalization)   ---------------------------------------------------------------------------------------------------------------------------------------------- Chemotoxicities: Hospitalization 05/11/2022-05/18/2022: Convulsive syncope, chemo induced neutropenia, oral mucositis, chronic CHF, severe diarrhea with dehydration ECHO 06/26/22: EF 55-60% (follows with B[facility]im[facility]) Hospitalization 11/18/2023-11/23/2023: COVID-19, hyponatremia and metabolic acidosis   Treatment ZOXW:9/60/45 [name]  maintenance.  (Added Perjeta  06/21/22-discontinued 06/2023 secondary to  colitis) CT CAP 05/16/2023: Bone metastases unchanged, unchanged pretracheal lymph nodes, colitis CT CAP 09/17/2023: Unchanged bone metastases. 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 Mark Sil.  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  Continue with the current treatment. ------------------------------------- Assessment and Plan Assessment & Plan Malignant neoplasm of upper-outer quadrant of left breast CT scan shows no new lesions, existing sclerotic bone lesions unchanged, no lymph node involvement. Responding well to treatment, stable scans. Hormonal changes cause dizziness and heat intolerance. - Continue current treatment regimen. - Schedule scans every six months. - Schedule follow-up appointments every three months. - Continue monthly infusions. - Manage dizziness and heat intolerance with cooling measures and hydration.  Back pain Chronic back pain associated with sclerotic bone lesions in thoracic and lumbar regions, well-managed since 2023.  Constipation Chronic constipation likely exacerbated by current treatments, reports difficulty with bowel movements. - Continue stool softeners, up to three times daily if needed. - Increase intake of greens and water. - Monitor bowel movements and adjust stool softener dosage as needed.      No orders of the defined types were placed in this encounter.  The patient has a good understanding of the overall plan. she agrees with it. she will call with any problems that may develop before the next visit here. Total time spent: 45 mins including face to face time and time spent for planning, charting and co-ordination of care    Viinay K Kristan Brummitt, MD 02/04/24

## 2024-02-04 NOTE — Patient Instructions (Signed)
 [facility] - A DEPT OF [facility]  Discharge Instructions: Thank you for choosing [facility] to provide your oncology and hematology care.   If you have a lab appointment with the Cancer Center, please go directly to the Cancer Center and check in at the registration area.   Wear comfortable clothing and clothing appropriate for easy access to any Portacath or PICC line.   We strive to give you quality time with your provider. You may need to reschedule your appointment if you arrive late (15 or more minutes).  Arriving late affects you and other patients whose appointments are after yours.  Also, if you miss three or more appointments without notifying the office, you may be dismissed from the clinic at the provider's discretion.      For prescription refill requests, have your pharmacy contact our office and allow 72 hours for refills to be completed.    Today you received the following chemotherapy and/or immunotherapy agents: Ogivri      To help prevent nausea and vomiting after your treatment, we encourage you to take your nausea medication as directed.  BELOW ARE SYMPTOMS THAT SHOULD BE REPORTED IMMEDIATELY: *FEVER GREATER THAN 100.4 F (38 C) OR HIGHER *CHILLS OR SWEATING *NAUSEA AND VOMITING THAT IS NOT CONTROLLED WITH YOUR NAUSEA MEDICATION *UNUSUAL SHORTNESS OF BREATH *UNUSUAL BRUISING OR BLEEDING *URINARY PROBLEMS (pain or burning when urinating, or frequent urination) *BOWEL PROBLEMS (unusual diarrhea, constipation, pain near the anus) TENDERNESS IN MOUTH AND THROAT WITH OR WITHOUT PRESENCE OF ULCERS (sore throat, sores in mouth, or a toothache) UNUSUAL RASH, SWELLING OR PAIN  UNUSUAL VAGINAL DISCHARGE OR ITCHING   Items with * indicate a potential emergency and should be followed up as soon as possible or go to the Emergency Department if any problems should occur.  Please show the CHEMOTHERAPY ALERT CARD or IMMUNOTHERAPY  ALERT CARD at check-in to the Emergency Department and triage nurse.  Should you have questions after your visit or need to cancel or reschedule your appointment, please contact [facility] - A DEPT OF [facility]  Dept: [phone]  and follow the prompts.  Office hours are 8:00 a.m. to 4:30 p.m. Monday - Friday. Please note that voicemails left after 4:00 p.m. may not be returned until the following business day.  We are closed weekends and major holidays. You have access to a nurse at all times for urgent questions. Please call the main number to the clinic Dept: [phone] and follow the prompts.   For any non-urgent questions, you may also contact your provider using MyChart. We now offer e-Visits for anyone 65 and older to request care online for non-urgent symptoms. For details visit [email or web address].   Also download the MyChart app! Go to the app store, search "MyChart", open the app, select [place], and log in with your MyChart username and password.

## 2024-02-06 ENCOUNTER — Telehealth: Payer: Self-pay | Admitting: Hematology and Oncology

## 2024-02-06 NOTE — Telephone Encounter (Signed)
 Confirmed with pt scheduled appt dates and times

## 2024-02-09 DIAGNOSIS — I427 Cardiomyopathy due to drug and external agent: Secondary | ICD-10-CM | POA: Diagnosis not present

## 2024-02-09 DIAGNOSIS — I10 Essential (primary) hypertension: Secondary | ICD-10-CM | POA: Diagnosis not present

## 2024-02-17 ENCOUNTER — Inpatient Hospital Stay: Admission: RE | Admit: 2024-02-17 | Source: Ambulatory Visit

## 2024-02-28 ENCOUNTER — Ambulatory Visit
Admission: RE | Admit: 2024-02-28 | Discharge: 2024-02-28 | Disposition: A | Discharge status: Home / Self Care | Source: Ambulatory Visit | Attending: Family Medicine | Admitting: Family Medicine

## 2024-02-28 DIAGNOSIS — M5146 Schmorl's nodes, lumbar region: Secondary | ICD-10-CM | POA: Diagnosis not present

## 2024-02-28 DIAGNOSIS — S32000A Wedge compression fracture of unspecified lumbar vertebra, initial encounter for closed fracture: Secondary | ICD-10-CM

## 2024-02-28 DIAGNOSIS — R937 Abnormal findings on diagnostic imaging of other parts of musculoskeletal system: Secondary | ICD-10-CM | POA: Diagnosis not present

## 2024-02-28 DIAGNOSIS — M545 Low back pain, unspecified: Secondary | ICD-10-CM | POA: Diagnosis not present

## 2024-02-28 DIAGNOSIS — G8929 Other chronic pain: Secondary | ICD-10-CM | POA: Diagnosis not present

## 2024-03-03 ENCOUNTER — Inpatient Hospital Stay: Attending: Hematology and Oncology

## 2024-03-03 ENCOUNTER — Inpatient Hospital Stay: Admitting: Hematology and Oncology

## 2024-03-03 VITALS — BP 149/69 | HR 72 | Temp 98.0°F | Resp 16 | Wt 158.5 lb

## 2024-03-03 DIAGNOSIS — Z171 Estrogen receptor negative status [ER-]: Secondary | ICD-10-CM | POA: Diagnosis not present

## 2024-03-03 DIAGNOSIS — C7951 Secondary malignant neoplasm of bone: Secondary | ICD-10-CM | POA: Insufficient documentation

## 2024-03-03 DIAGNOSIS — E237 Disorder of pituitary gland, unspecified: Secondary | ICD-10-CM | POA: Diagnosis not present

## 2024-03-03 DIAGNOSIS — Z803 Family history of malignant neoplasm of breast: Secondary | ICD-10-CM | POA: Insufficient documentation

## 2024-03-03 DIAGNOSIS — C50412 Malignant neoplasm of upper-outer quadrant of left female breast: Secondary | ICD-10-CM | POA: Insufficient documentation

## 2024-03-03 DIAGNOSIS — K5909 Other constipation: Secondary | ICD-10-CM | POA: Diagnosis not present

## 2024-03-03 DIAGNOSIS — Z9012 Acquired absence of left breast and nipple: Secondary | ICD-10-CM | POA: Insufficient documentation

## 2024-03-03 DIAGNOSIS — Z8042 Family history of malignant neoplasm of prostate: Secondary | ICD-10-CM | POA: Insufficient documentation

## 2024-03-03 DIAGNOSIS — Z5112 Encounter for antineoplastic immunotherapy: Secondary | ICD-10-CM | POA: Diagnosis not present

## 2024-03-03 DIAGNOSIS — M545 Low back pain, unspecified: Secondary | ICD-10-CM | POA: Insufficient documentation

## 2024-03-03 MED ORDER — SODIUM CHLORIDE 0.9 % IV SOLN: Freq: Once | INTRAVENOUS | Status: AC

## 2024-03-03 MED ORDER — DIPHENHYDRAMINE HCL 25 MG PO CAPS
25.0000 mg | ORAL_CAPSULE | Freq: Once | ORAL | Status: AC
  Administered 2024-03-03: 25 mg via ORAL
  Filled 2024-03-03: qty 1

## 2024-03-03 MED ORDER — TRASTUZUMAB-DKST CHEMO 150 MG IV SOLR
6.0000 mg/kg | Freq: Once | INTRAVENOUS | Status: AC
  Administered 2024-03-03: 420 mg via INTRAVENOUS
  Filled 2024-03-03: qty 20

## 2024-03-03 MED ORDER — ACETAMINOPHEN 325 MG PO TABS
650.0000 mg | ORAL_TABLET | Freq: Once | ORAL | Status: AC
  Administered 2024-03-03: 650 mg via ORAL
  Filled 2024-03-03: qty 2

## 2024-03-03 NOTE — Patient Instructions (Signed)
 [facility] - A DEPT OF [facility]  Discharge Instructions: Thank you for choosing [facility] to provide your oncology and hematology care.   If you have a lab appointment with the Cancer Center, please go directly to the Cancer Center and check in at the registration area.   Wear comfortable clothing and clothing appropriate for easy access to any Portacath or PICC line.   We strive to give you quality time with your provider. You may need to reschedule your appointment if you arrive late (15 or more minutes).  Arriving late affects you and other patients whose appointments are after yours.  Also, if you miss three or more appointments without notifying the office, you may be dismissed from the clinic at the provider's discretion.      For prescription refill requests, have your pharmacy contact our office and allow 72 hours for refills to be completed.    Today you received the following chemotherapy and/or immunotherapy agents: Ogivri      To help prevent nausea and vomiting after your treatment, we encourage you to take your nausea medication as directed.  BELOW ARE SYMPTOMS THAT SHOULD BE REPORTED IMMEDIATELY: *FEVER GREATER THAN 100.4 F (38 C) OR HIGHER *CHILLS OR SWEATING *NAUSEA AND VOMITING THAT IS NOT CONTROLLED WITH YOUR NAUSEA MEDICATION *UNUSUAL SHORTNESS OF BREATH *UNUSUAL BRUISING OR BLEEDING *URINARY PROBLEMS (pain or burning when urinating, or frequent urination) *BOWEL PROBLEMS (unusual diarrhea, constipation, pain near the anus) TENDERNESS IN MOUTH AND THROAT WITH OR WITHOUT PRESENCE OF ULCERS (sore throat, sores in mouth, or a toothache) UNUSUAL RASH, SWELLING OR PAIN  UNUSUAL VAGINAL DISCHARGE OR ITCHING   Items with * indicate a potential emergency and should be followed up as soon as possible or go to the Emergency Department if any problems should occur.  Please show the CHEMOTHERAPY ALERT CARD or IMMUNOTHERAPY  ALERT CARD at check-in to the Emergency Department and triage nurse.  Should you have questions after your visit or need to cancel or reschedule your appointment, please contact [facility] - A DEPT OF [facility]  Dept: [phone]  and follow the prompts.  Office hours are 8:00 a.m. to 4:30 p.m. Monday - Friday. Please note that voicemails left after 4:00 p.m. may not be returned until the following business day.  We are closed weekends and major holidays. You have access to a nurse at all times for urgent questions. Please call the main number to the clinic Dept: [phone] and follow the prompts.   For any non-urgent questions, you may also contact your provider using MyChart. We now offer e-Visits for anyone 65 and older to request care online for non-urgent symptoms. For details visit [email or web address].   Also download the MyChart app! Go to the app store, search "MyChart", open the app, select [place], and log in with your MyChart username and password.

## 2024-03-06 DIAGNOSIS — R5383 Other fatigue: Secondary | ICD-10-CM | POA: Diagnosis not present

## 2024-03-06 DIAGNOSIS — C50919 Malignant neoplasm of unspecified site of unspecified female breast: Secondary | ICD-10-CM | POA: Diagnosis not present

## 2024-03-06 DIAGNOSIS — S32000A Wedge compression fracture of unspecified lumbar vertebra, initial encounter for closed fracture: Secondary | ICD-10-CM | POA: Diagnosis not present

## 2024-03-06 DIAGNOSIS — C7951 Secondary malignant neoplasm of bone: Secondary | ICD-10-CM | POA: Diagnosis not present

## 2024-03-06 DIAGNOSIS — G3184 Mild cognitive impairment, so stated: Secondary | ICD-10-CM | POA: Diagnosis not present

## 2024-03-27 DIAGNOSIS — I10 Essential (primary) hypertension: Secondary | ICD-10-CM | POA: Diagnosis not present

## 2024-03-31 ENCOUNTER — Other Ambulatory Visit: Payer: Self-pay

## 2024-03-31 ENCOUNTER — Inpatient Hospital Stay

## 2024-03-31 ENCOUNTER — Encounter: Payer: Self-pay | Admitting: Hematology and Oncology

## 2024-03-31 VITALS — BP 168/78 | HR 70 | Temp 98.0°F | Resp 17 | Wt 159.0 lb

## 2024-03-31 DIAGNOSIS — Z171 Estrogen receptor negative status [ER-]: Secondary | ICD-10-CM | POA: Diagnosis not present

## 2024-03-31 DIAGNOSIS — C7951 Secondary malignant neoplasm of bone: Secondary | ICD-10-CM | POA: Diagnosis not present

## 2024-03-31 DIAGNOSIS — Z95828 Presence of other vascular implants and grafts: Secondary | ICD-10-CM

## 2024-03-31 DIAGNOSIS — C50412 Malignant neoplasm of upper-outer quadrant of left female breast: Secondary | ICD-10-CM | POA: Diagnosis not present

## 2024-03-31 DIAGNOSIS — Z5112 Encounter for antineoplastic immunotherapy: Secondary | ICD-10-CM | POA: Diagnosis not present

## 2024-03-31 DIAGNOSIS — Z9012 Acquired absence of left breast and nipple: Secondary | ICD-10-CM | POA: Diagnosis not present

## 2024-03-31 DIAGNOSIS — M545 Low back pain, unspecified: Secondary | ICD-10-CM | POA: Diagnosis not present

## 2024-03-31 LAB — CMP (CANCER CENTER ONLY)
ALT: 17 U/L (ref 0–44)
AST: 20 U/L (ref 15–41)
Albumin: 3.9 g/dL (ref 3.5–5.0)
Alkaline Phosphatase: 81 U/L (ref 38–126)
Anion gap: 5 (ref 5–15)
BUN: 22 mg/dL (ref 8–23)
CO2: 27 mmol/L (ref 22–32)
Calcium: 8.8 mg/dL — ABNORMAL LOW (ref 8.9–10.3)
Chloride: 101 mmol/L (ref 98–111)
Creatinine: 1.66 mg/dL — ABNORMAL HIGH (ref 0.44–1.00)
GFR, Estimated: 31 mL/min — ABNORMAL LOW (ref >60)
Glucose, Bld: 134 mg/dL — ABNORMAL HIGH (ref 70–99)
Potassium: 4.5 mmol/L (ref 3.5–5.1)
Sodium: 133 mmol/L — ABNORMAL LOW (ref 135–145)
Total Bilirubin: 0.4 mg/dL (ref 0.0–1.2)
Total Protein: 6.2 g/dL — ABNORMAL LOW (ref 6.5–8.1)

## 2024-03-31 LAB — CBC WITH DIFFERENTIAL (CANCER CENTER ONLY)
Abs Immature Granulocytes: 0.02 10*3/uL (ref 0.00–0.07)
Basophils Absolute: 0.1 10*3/uL (ref 0.0–0.1)
Basophils Relative: 1 %
Eosinophils Absolute: 0.1 10*3/uL (ref 0.0–0.5)
Eosinophils Relative: 2 %
HCT: 32.3 % — ABNORMAL LOW (ref 36.0–46.0)
Hemoglobin: 10.8 g/dL — ABNORMAL LOW (ref 12.0–15.0)
Immature Granulocytes: 0 %
Lymphocytes Relative: 12 %
Lymphs Abs: 0.8 10*3/uL (ref 0.7–4.0)
MCH: 27.6 pg (ref 26.0–34.0)
MCHC: 33.4 g/dL (ref 30.0–36.0)
MCV: 82.4 fL (ref 80.0–100.0)
Monocytes Absolute: 0.6 10*3/uL (ref 0.1–1.0)
Monocytes Relative: 9 %
Neutro Abs: 4.6 10*3/uL (ref 1.7–7.7)
Neutrophils Relative %: 76 %
Platelet Count: 237 10*3/uL (ref 150–400)
RBC: 3.92 MIL/uL (ref 3.87–5.11)
RDW: 13.1 % (ref 11.5–15.5)
WBC Count: 6.1 10*3/uL (ref 4.0–10.5)
nRBC: 0 % (ref 0.0–0.2)

## 2024-03-31 MED ORDER — HEPARIN SOD (PORK) LOCK FLUSH 100 UNIT/ML IV SOLN
500.0000 [IU] | Freq: Once | INTRAVENOUS | Status: AC | PRN
  Administered 2024-03-31: 500 [IU]

## 2024-03-31 MED ORDER — SODIUM CHLORIDE 0.9% FLUSH
10.0000 mL | INTRAVENOUS | Status: DC | PRN
  Administered 2024-03-31: 10 mL

## 2024-03-31 MED ORDER — ZOLEDRONIC ACID 4 MG/5ML IV CONC
3.0000 mg | Freq: Once | INTRAVENOUS | Status: AC
  Administered 2024-03-31: 3 mg via INTRAVENOUS
  Filled 2024-03-31: qty 3.75

## 2024-03-31 MED ORDER — DIPHENHYDRAMINE HCL 25 MG PO CAPS
25.0000 mg | ORAL_CAPSULE | Freq: Once | ORAL | Status: AC
  Administered 2024-03-31: 25 mg via ORAL
  Filled 2024-03-31: qty 1

## 2024-03-31 MED ORDER — SODIUM CHLORIDE 0.9 % IV SOLN
Freq: Once | INTRAVENOUS | Status: AC
Start: 2024-03-31 — End: 2024-03-31

## 2024-03-31 MED ORDER — ACETAMINOPHEN 325 MG PO TABS
650.0000 mg | ORAL_TABLET | Freq: Once | ORAL | Status: AC
  Administered 2024-03-31: 650 mg via ORAL
  Filled 2024-03-31: qty 2

## 2024-03-31 MED ORDER — TRASTUZUMAB-DKST CHEMO 150 MG IV SOLR
6.0000 mg/kg | Freq: Once | INTRAVENOUS | Status: AC
  Administered 2024-03-31: 420 mg via INTRAVENOUS
  Filled 2024-03-31: qty 20

## 2024-03-31 NOTE — Progress Notes (Signed)
 Reduce zometa  to 3mg  today d/t increased SCr per Dr Odean

## 2024-03-31 NOTE — Patient Instructions (Signed)
 [facility] - A DEPT OF [facility]  Discharge Instructions: Thank you for choosing [facility] to provide your oncology and hematology care.   If you have a lab appointment with the Cancer Center, please go directly to the Cancer Center and check in at the registration area.   Wear comfortable clothing and clothing appropriate for easy access to any Portacath or PICC line.   We strive to give you quality time with your provider. You may need to reschedule your appointment if you arrive late (15 or more minutes).  Arriving late affects you and other patients whose appointments are after yours.  Also, if you miss three or more appointments without notifying the office, you may be dismissed from the clinic at the provider's discretion.      For prescription refill requests, have your pharmacy contact our office and allow 72 hours for refills to be completed.    Today you received the following chemotherapy and/or immunotherapy agents: Ogivri      To help prevent nausea and vomiting after your treatment, we encourage you to take your nausea medication as directed.  BELOW ARE SYMPTOMS THAT SHOULD BE REPORTED IMMEDIATELY: *FEVER GREATER THAN 100.4 F (38 C) OR HIGHER *CHILLS OR SWEATING *NAUSEA AND VOMITING THAT IS NOT CONTROLLED WITH YOUR NAUSEA MEDICATION *UNUSUAL SHORTNESS OF BREATH *UNUSUAL BRUISING OR BLEEDING *URINARY PROBLEMS (pain or burning when urinating, or frequent urination) *BOWEL PROBLEMS (unusual diarrhea, constipation, pain near the anus) TENDERNESS IN MOUTH AND THROAT WITH OR WITHOUT PRESENCE OF ULCERS (sore throat, sores in mouth, or a toothache) UNUSUAL RASH, SWELLING OR PAIN  UNUSUAL VAGINAL DISCHARGE OR ITCHING   Items with * indicate a potential emergency and should be followed up as soon as possible or go to the Emergency Department if any problems should occur.  Please show the CHEMOTHERAPY ALERT CARD or IMMUNOTHERAPY  ALERT CARD at check-in to the Emergency Department and triage nurse.  Should you have questions after your visit or need to cancel or reschedule your appointment, please contact [facility] - A DEPT OF [facility]  Dept: [phone]  and follow the prompts.  Office hours are 8:00 a.m. to 4:30 p.m. Monday - Friday. Please note that voicemails left after 4:00 p.m. may not be returned until the following business day.  We are closed weekends and major holidays. You have access to a nurse at all times for urgent questions. Please call the main number to the clinic Dept: [phone] and follow the prompts.   For any non-urgent questions, you may also contact your provider using MyChart. We now offer e-Visits for anyone 44 and older to request care online for non-urgent symptoms. For details visit [email or web address].   Also download the MyChart app! Go to the app store, search "MyChart", open the app, select [place], and log in with your MyChart username and password.  Zoledronic Acid Injection (Cancer) What is this medication? ZOLEDRONIC ACID (ZOE le dron ik AS id) treats high calcium levels in the blood caused by cancer. It may also be used with chemotherapy to treat weakened bones caused by cancer. It works by slowing down the release of calcium from bones. This lowers calcium levels in your blood. It also makes your bones stronger and less likely to break (fracture). It belongs to a group of medications called bisphosphonates. This medicine may be used for other purposes; ask your health care provider  or pharmacist if you have questions. COMMON BRAND NAME(S): Zometa, Zometa Powder What should I tell my care team before I take this medication? They need to know if you have any of these conditions: Dehydration Dental disease Kidney disease Liver disease Low levels of calcium in the blood Lung or breathing disease, such as asthma Receiving steroids, such as  dexamethasone or prednisone An unusual or allergic reaction to zoledronic acid, other medications, foods, dyes, or preservatives Pregnant or trying to get pregnant Breast-feeding How should I use this medication? This medication is injected into a vein. It is given by your care team in a hospital or clinic setting. Talk to your care team about the use of this medication in children. Special care may be needed. Overdosage: If you think you have taken too much of this medicine contact a poison control center or emergency room at once. NOTE: This medicine is only for you. Do not share this medicine with others. What if I miss a dose? Keep appointments for follow-up doses. It is important not to miss your dose. Call your care team if you are unable to keep an appointment. What may interact with this medication? Certain antibiotics given by injection Diuretics, such as bumetanide, furosemide NSAIDs, medications for pain and inflammation, such as ibuprofen or naproxen Teriparatide Thalidomide This list may not describe all possible interactions. Give your health care provider a list of all the medicines, herbs, non-prescription drugs, or dietary supplements you use. Also tell them if you smoke, drink alcohol, or use illegal drugs. Some items may interact with your medicine. What should I watch for while using this medication? Visit your care team for regular checks on your progress. It may be some time before you see the benefit from this medication. Some people who take this medication have severe bone, joint, or muscle pain. This medication may also increase your risk for jaw problems or a broken thigh bone. Tell your care team right away if you have severe pain in your jaw, bones, joints, or muscles. Tell you care team if you have any pain that does not go away or that gets worse. Tell your dentist and dental surgeon that you are taking this medication. You should not have major dental surgery  while on this medication. See your dentist to have a dental exam and fix any dental problems before starting this medication. Take good care of your teeth while on this medication. Make sure you see your dentist for regular follow-up appointments. You should make sure you get enough calcium and vitamin D while you are taking this medication. Discuss the foods you eat and the vitamins you take with your care team. Check with your care team if you have severe diarrhea, nausea, and vomiting, or if you sweat a lot. The loss of too much body fluid may make it dangerous for you to take this medication. You may need bloodwork while taking this medication. Talk to your care team if you wish to become pregnant or think you might be pregnant. This medication can cause serious birth defects. What side effects may I notice from receiving this medication? Side effects that you should report to your care team as soon as possible: Allergic reactions--skin rash, itching, hives, swelling of the face, lips, tongue, or throat Kidney injury--decrease in the amount of urine, swelling of the ankles, hands, or feet Low calcium level--muscle pain or cramps, confusion, tingling, or numbness in the hands or feet Osteonecrosis of the jaw--pain, swelling, or redness  in the mouth, numbness of the jaw, poor healing after dental work, unusual discharge from the mouth, visible bones in the mouth Severe bone, joint, or muscle pain Side effects that usually do not require medical attention (report to your care team if they continue or are bothersome): Constipation Fatigue Fever Loss of appetite Nausea Stomach pain This list may not describe all possible side effects. Call your doctor for medical advice about side effects. You may report side effects to FDA at 1-800-FDA-1088. Where should I keep my medication? This medication is given in a hospital or clinic. It will not be stored at home. NOTE: This sheet is a summary. It may  not cover all possible information. If you have questions about this medicine, talk to your doctor, pharmacist, or health care provider.  2024 Elsevier/Gold Standard (2021-11-11 00:00:00)

## 2024-04-11 DIAGNOSIS — N179 Acute kidney failure, unspecified: Secondary | ICD-10-CM | POA: Diagnosis not present

## 2024-04-21 ENCOUNTER — Ambulatory Visit

## 2024-04-21 ENCOUNTER — Telehealth: Payer: Self-pay

## 2024-04-21 DIAGNOSIS — C50412 Malignant neoplasm of upper-outer quadrant of left female breast: Secondary | ICD-10-CM

## 2024-04-21 NOTE — Telephone Encounter (Signed)
 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 Currently on ogivri  and zometa ; last tx 03/31/24. Pt reports she has experienced constipation since her colonoscopy last year, BUT, she has been taking dulcolax and has had no productive BM, reports a watery stool this morning but endorses LU abd pain, states her abd is swollen and hard. She is concerned because this is not normal for her. denies n/v, states she feels weak and tired. able to eat and drink  S/w [facility] at [facility] and she offered appt for 04/22/24 0930 lab and [facility] appt. Pt is agreeable to this and was advised to try Miralax  as well. Appts scheduled and lab orders entered for CBC CMP MAG per [place], [place].

## 2024-04-21 NOTE — Progress Notes (Signed)
 Symptom Management Consult Note [facility]    Patient Care Team: Loreli Kins, MD as PCP - General (Family Medicine) Georjean Darice HERO, MD as Consulting Physician (Neurology) Glean Stephane BROCKS, RN (Inactive) as Oncology Nurse Navigator Tyree Nanetta SAILOR, RN as Oncology Nurse Navigator Vernetta Berg, MD as Consulting Physician (General Surgery) Odean Potts, MD as Consulting Physician (Hematology and Oncology) Izell Domino, MD as Attending Physician (Radiation Oncology)    Name / MRN / DOB: Allison Pierce  [id number]  02-19-1946   Date of visit: 04/22/2024   Chief Complaint/Reason for visit: constipation   Current Therapy: trastuzumab -dkst  Last treatment:  Day 1   Cycle 29 on 03/31/24    ASSESSMENT AND PLAN Patient is a 78 y.o. female with oncologic history of malignant neoplasm of upper-outer quadrant of left breast in female, estrogen receptor negativefollowed by Dr. Odean.  I have viewed most recent oncology note and lab work.  #Malignant neoplasm of upper-outer quadrant of left breast in female, estrogen receptor negative  - Next appointment with oncologist is 04/28/24  #AKI -Creatinine today is 1.59, down from 1.66 x 3 weeks ago. Patient received 1L NS in clinic for hydration support.  - Patient knows to increase fluid intake at home. Labs will rechecked at next appointment.   #Fatigue - No significant electrolyte derangement. Stable anemia. - [name]  can cause fatigue 29.5-35%. Discussed energy conservation techniques. Fatigue is not currently prohibiting performing ADLs.   #Constipation - Benign abdominal exam.  - Chronic constipation with bloating and intermittent watery stools.  Current regimen of liquid stool softeners is ineffective.  - Discussed how to take Miralax  and Senokot. If no bowel movement in can try 1/2 bottle of magnesium  citrate. Patient and spouse know to adjust frequency of Miralax  and Senokot depending on stool  consistency. - Encouraged to follow up with PCP or Gi if no improvement.   Strict ED precautions discussed should symptoms worsen.   HEME/ONC HISTORY Oncology History  Malignant neoplasm of upper-outer quadrant of left breast in female, estrogen receptor negative (HCC)  02/10/2022 Initial Diagnosis   Palpable left breast masses and calcifications: 1.8 cm at 1:00 and 1.7 cm at 9:00, calcifications not biopsy.  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 Report date is 03/05/2022.  The CustomNext-Cancer+RNAinsight panel offered by [facility] includes sequencing and rearrangement analysis for the following 48 genes:  APC, ATM, AXIN2, BARD1, BMPR1A, BRCA1, BRCA2, BRIP1, CDH1, CDK4, CDKN2A, CHEK2, CTNNA1, DICER1, EGFR, EPCAM, GREM1, HOXB13, KIT, MEN1, MLH1, MSH2, MSH3, MSH6, MUTYH, NBN, NF1, NTHL1, PALB2, PDGFRA, PMS2, POLD1, POLE, PTEN, RAD50, RAD51C, RAD51D, SDHA, SDHB, SDHC, SDHD, SMAD4, SMARCA4, STK11, TP53, TSC1, TSC2, and VHL.  RNA data is routinely analyzed for use in variant interpretation for all genes.   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, LCIS, 5/5 lymph nodes positive with extranodal extension, margins negative ER 0%, PR 0%, HER2 3+, Ki-67 20%   04/27/2022 Cancer Staging   Staging form: Breast, AJCC 8th Edition - Pathologic: Stage IIIA (pT2, pN2, cM0, G2, ER-, PR-, HER2+) -  Signed by Odean Potts, MD on 04/27/2022 Histologic grading system: 3 grade system   05/05/2022 - 05/26/2022 Chemotherapy   Patient is on Treatment Plan : BREAST  DOCEtaxel  + Trastuzumab  + Pertuzumab  (THP) q21d x 8 cycles / Trastuzumab  + Pertuzumab  q21d x 4 cycles     05/26/2022 -  Chemotherapy   Patient is on Treatment Plan : BREAST MAINTENANCE Trastuzumab  IV (6) or SQ (600) D1 q21d X 11 Cycles     06/12/2023 Cancer Staging   Staging form: Breast, AJCC 8th Edition - Pathologic: Stage IV (cM1) - Signed by Crawford Morna Pickle, NP on 06/12/2023       INTERVAL HISTORY  Discussed the use of AI scribe software for clinical note transcription with the patient, who gave verbal consent to proceed.    Allison Pierce is a 78 y.o. female with oncologic history as above presenting to [facility] today with chief complaint of fatigue and constipation. She is accompanied by her husband who provides additional history.  She experiences significant fatigue and constipation following treatment that she received about a month ago. She is scheduled for chemotherapy next week. She describes being 'so tired' and having difficulty with bowel movements despite taking laxatives, which provide minimal relief. She has not had a hard stool in a long time and experiences bloating. Her fatigue has not improved since last treatment. She is able to perform ADLs without difficulty.  She has a history of constipation that began after a colonoscopy performed approximately a year and a half ago. Prior to this, she was regular and would often have bowel movements after eating salads. She currently manages her constipation with liquid stool softeners taken twice a day for the past two weeks, although she sometimes forgets to take them. Her last bowel movement was watery, and she cannot recall the last time she had a formed stool. No fever, or abdominal pain but she feels bloated. Her appetite is fine, and she has not experienced significant weight loss. Denies associated nausea or vomiting.   ROS  All other systems are reviewed and are negative for acute change except as noted in the  HPI.    Allergies  Allergen Reactions   Oxybutynin Other (See Comments)    Unknown per Pt    Prozac [Fluoxetine Hcl] Other (See Comments)    Could not lift head up or get out of bed    Ritalin [Methylphenidate] Other (See Comments)    Unknown per Pt    Rosuvastatin Other (See Comments)    Unknown per Pt    Lyrica [Pregabalin] Anxiety and Other (See Comments)    Felt weird     Past Medical History:  Diagnosis Date   Anxiety    C. difficile diarrhea    Depression    GERD (gastroesophageal reflux disease) OCCASIONALLY  TAKE TUMS   Human papilloma virus    Hyperlipidemia    Hypertension    left breast ca 12/2021   UC (ulcerative colitis) (HCC)    UTI (urinary tract infection)    Vulvar lesion      Past Surgical History:  Procedure Laterality Date   ABDOMINAL HYSTERECTOMY  1992   W/ SALPINGO-OOPHORECTOMY   ANTERIOR CERVICAL DECOMP/DISCECTOMY FUSION  01-18-2009  DR POOLE   C4  - C6   AUGMENTATION MAMMAPLASTY Bilateral    BREAST BIOPSY Right    BREAST BIOPSY Left 02/10/2022   x2   BREAST ENHANCEMENT SURGERY  2011   BREAST EXCISIONAL BIOPSY Left    BREAST  RECONSTRUCTION WITH PLACEMENT OF TISSUE EXPANDER AND FLEX HD (ACELLULAR HYDRATED DERMIS) Left 04/07/2022   Procedure: BREAST RECONSTRUCTION WITH PLACEMENT OF TISSUE EXPANDER AND FLEX HD (ACELLULAR HYDRATED DERMIS);  Surgeon: Elisabeth Craig RAMAN, MD;  Location: [facility] OR;  Service: Plastics;  Laterality: Left;   BREAST SURGERY  02-04-2011  dr merrilyn   EXCISION LEFT BREAST MASS--  CALCIFICATION   EYE SURGERY Bilateral    cataract removal   MASTECTOMY W/ SENTINEL NODE BIOPSY Left 04/07/2022   Procedure: LEFT MASTECTOMY WITH SENTINEL NODE BIOPSY;  Surgeon: Vernetta Berg, MD;  Location: [facility] OR;  Service: General;  Laterality: Left;  LMA   PORTACATH PLACEMENT N/A 04/07/2022   Procedure: PORT-A-CATH INSERTION WITH ULTRASOUND GUIDANCE;  Surgeon: Vernetta Berg, MD;  Location: [facility] OR;  Service: General;  Laterality: N/A;   REMOVAL  OF TISSUE EXPANDER AND PLACEMENT OF IMPLANT Left 03/16/2023   Procedure: Removal of left breast tissue expander and placement of permanent implant;  Surgeon: Waddell Leonce NOVAK, MD;  Location: [facility];  Service: Plastics;  Laterality: Left;   VULVAR LESION REMOVAL  09/10/2012   Procedure: VULVAR LESION;  Surgeon: Carlin LELON Forbes, MD;  Location: [facility];  Service: Gynecology;  Laterality: N/A;  WIDE EXCISION OF VULVAR LESION    Social History   Socioeconomic History   Marital status: Married    Spouse name: Lynwood   Number of children: 1   Years of education: 12   Highest education level: Not on file  Occupational History   Occupation: retired  Tobacco Use   Smoking status: Never   Smokeless tobacco: Never  Vaping Use   Vaping status: Never Used  Substance and Sexual Activity   Alcohol use: Yes    Alcohol/week: 7.0 standard drinks of alcohol    Types: 7 Cans of beer per week   Drug use: Never   Sexual activity: Not Currently  Other Topics Concern   Not on file  Social History Narrative   Graduated HS      Right handed      Lives with husband   Social Drivers of Health   Financial Resource Strain: Low Risk  (02/22/2022)   Overall Financial Resource Strain (CARDIA)    Difficulty of Paying Living Expenses: Not hard at all  Food Insecurity: No Food Insecurity (11/25/2023)   Hunger Vital Sign    Worried About Running Out of Food in the Last Year: Never true    Ran Out of Food in the Last Year: Never true  Transportation Needs: No Transportation Needs (11/25/2023)   PRAPARE - Administrator, Civil Service (Medical): No    Lack of Transportation (Non-Medical): No  Physical Activity: Not on file  Stress: Not on file  Social Connections: Patient Declined (11/25/2023)   Social Connection and Isolation Panel    Frequency of Communication with Friends and Family: Patient declined    Frequency of Social Gatherings with Friends and  Family: Patient declined    Attends Religious Services: Patient declined    Database administrator or Organizations: Patient declined    Attends Banker Meetings: Patient declined    Marital Status: Patient declined  Intimate Partner Violence: Not At Risk (11/25/2023)   Humiliation, Afraid, Rape, and Kick questionnaire    Fear of Current or Ex-Partner: No    Emotionally Abused: No    Physically Abused: No    Sexually Abused: No    Family History  Problem Relation Age of  Onset   Lung cancer Mother 62       she smoked   Prostate cancer Brother 66   Breast cancer Cousin        paternal first cousin   Breast cancer Cousin        paternal first cousin   Liver disease Neg Hx    Esophageal cancer Neg Hx    Colon cancer Neg Hx      Current Outpatient Medications:    bisacodyl  (DULCOLAX) 5 MG EC tablet, Take 1 tablet (5 mg total) by mouth daily as needed for moderate constipation., Disp: 60 tablet, Rfl: 3   buPROPion  (WELLBUTRIN  XL) 300 MG 24 hr tablet, Take 300 mg by mouth in the morning., Disp: , Rfl:    clonazePAM  (KLONOPIN ) 1 MG tablet, Take 1 mg by mouth at bedtime., Disp: , Rfl:    diphenhydrAMINE  (BENADRYL ) 25 MG tablet, Take 25 mg by mouth in the morning and at bedtime., Disp: , Rfl:    fluticasone  (FLONASE ) 50 MCG/ACT nasal spray, Place 1 spray into both nostrils daily as needed for allergies., Disp: , Rfl:    gabapentin  (NEURONTIN ) 100 MG capsule, Take 1 capsule (100 mg total) by mouth at bedtime. (Patient not taking: Reported on 12/04/2023), Disp: 14 capsule, Rfl: 0   promethazine -dextromethorphan  (PROMETHAZINE -DM) 6.25-15 MG/5ML syrup, Take 5 mLs by mouth 4 (four) times daily as needed for cough., Disp: 180 mL, Rfl: 0   simvastatin  (ZOCOR ) 40 MG tablet, Take by mouth. (Patient not taking: Reported on 11/21/2023), Disp: , Rfl:    traZODone  (DESYREL ) 150 MG tablet, Take 225 mg by mouth at bedtime., Disp: , Rfl:    venlafaxine  XR (EFFEXOR -XR) 75 MG 24 hr capsule, Take  75 mg by mouth daily with breakfast., Disp: , Rfl:   PHYSICAL EXAM ECOG FS:1 - Symptomatic but completely ambulatory    Vitals:   04/22/24 0945 04/22/24 0950 04/22/24 1135  BP: (!) 168/82 (!) 169/87 (!) 175/82  Pulse: 78  62  Resp: 18  16  Temp: 98 F (36.7 C)    TempSrc: Oral    SpO2: 100%  100%  Weight: 155 lb (70.3 kg)     Physical Exam Vitals and nursing note reviewed.  Constitutional:      Appearance: She is not ill-appearing or toxic-appearing.  HENT:     Head: Normocephalic.  Eyes:     Conjunctiva/sclera: Conjunctivae normal.  Cardiovascular:     Rate and Rhythm: Normal rate and regular rhythm.     Pulses: Normal pulses.     Heart sounds: Normal heart sounds.  Pulmonary:     Effort: Pulmonary effort is normal.     Breath sounds: Normal breath sounds.  Abdominal:     General: Bowel sounds are normal. There is no distension.     Palpations: Abdomen is soft.     Tenderness: There is no abdominal tenderness. There is no guarding.  Musculoskeletal:     Cervical back: Normal range of motion.  Skin:    General: Skin is warm and dry.  Neurological:     Mental Status: She is alert.        LABORATORY DATA I have reviewed the data as listed    Latest Ref Rng & Units 04/22/2024    9:31 AM 03/31/2024    4:18 PM 11/29/2023   12:16 PM  CBC  WBC 4.0 - 10.5 K/uL 5.7  6.1  10.1   Hemoglobin 12.0 - 15.0 g/dL 88.5  89.1  11.3  Hematocrit 36.0 - 46.0 % 34.5  32.3  34.8   Platelets 150 - 400 K/uL 293  237  345         Latest Ref Rng & Units 04/22/2024    9:31 AM 03/31/2024    4:18 PM 01/28/2024    2:35 PM  CMP  Glucose 70 - 99 mg/dL 878  865    BUN 8 - 23 mg/dL 15  22    Creatinine 9.55 - 1.00 mg/dL 8.40  8.33  8.89   Sodium 135 - 145 mmol/L 134  133    Potassium 3.5 - 5.1 mmol/L 4.1  4.5    Chloride 98 - 111 mmol/L 101  101    CO2 22 - 32 mmol/L 26  27    Calcium 8.9 - 10.3 mg/dL 8.8  8.8    Total Protein 6.5 - 8.1 g/dL 6.4  6.2    Total Bilirubin 0.0 - 1.2  mg/dL 0.4  0.4    Alkaline Phos 38 - 126 U/L 81  81    AST 15 - 41 U/L 19  20    ALT 0 - 44 U/L 16  17         RADIOGRAPHIC STUDIES (from last 24 hours if applicable) I have personally reviewed the radiological images as listed and agreed with the findings in the report. No results found.      Visit Diagnosis: 1. Malignant neoplasm of upper-outer quadrant of left breast in female, estrogen receptor negative (HCC)   2. Other constipation   3. Other fatigue   4. AKI (acute kidney injury) (HCC)      No orders of the defined types were placed in this encounter.   All questions were answered. The patient knows to call the clinic with any problems, questions or concerns. No barriers to learning was detected.  A total of more than 30 minutes were spent on this encounter with face-to-face time and non-face-to-face time, including preparing to see the patient, ordering tests and/or medications, counseling the patient and coordination of care as outlined above.    Thank you for allowing me to participate in the care of this patient.    Crytal Pensinger E  Walisiewicz, PA-C Department of Hematology/Oncology [facility] at [facility] Phone: [phone]  [phone]    04/22/2024 11:45 AM

## 2024-04-22 ENCOUNTER — Inpatient Hospital Stay

## 2024-04-22 ENCOUNTER — Inpatient Hospital Stay: Attending: Hematology and Oncology | Admitting: Physician Assistant

## 2024-04-22 VITALS — BP 175/82 | HR 62 | Temp 98.0°F | Resp 16 | Wt 155.0 lb

## 2024-04-22 DIAGNOSIS — Z803 Family history of malignant neoplasm of breast: Secondary | ICD-10-CM | POA: Diagnosis not present

## 2024-04-22 DIAGNOSIS — Z9012 Acquired absence of left breast and nipple: Secondary | ICD-10-CM | POA: Insufficient documentation

## 2024-04-22 DIAGNOSIS — D649 Anemia, unspecified: Secondary | ICD-10-CM | POA: Insufficient documentation

## 2024-04-22 DIAGNOSIS — N179 Acute kidney failure, unspecified: Secondary | ICD-10-CM | POA: Diagnosis not present

## 2024-04-22 DIAGNOSIS — K5909 Other constipation: Secondary | ICD-10-CM | POA: Diagnosis not present

## 2024-04-22 DIAGNOSIS — R5383 Other fatigue: Secondary | ICD-10-CM | POA: Diagnosis not present

## 2024-04-22 DIAGNOSIS — Z801 Family history of malignant neoplasm of trachea, bronchus and lung: Secondary | ICD-10-CM | POA: Insufficient documentation

## 2024-04-22 DIAGNOSIS — C7951 Secondary malignant neoplasm of bone: Secondary | ICD-10-CM | POA: Insufficient documentation

## 2024-04-22 DIAGNOSIS — Z8042 Family history of malignant neoplasm of prostate: Secondary | ICD-10-CM | POA: Diagnosis not present

## 2024-04-22 DIAGNOSIS — Z171 Estrogen receptor negative status [ER-]: Secondary | ICD-10-CM | POA: Insufficient documentation

## 2024-04-22 DIAGNOSIS — C50412 Malignant neoplasm of upper-outer quadrant of left female breast: Secondary | ICD-10-CM

## 2024-04-22 DIAGNOSIS — F32A Depression, unspecified: Secondary | ICD-10-CM | POA: Insufficient documentation

## 2024-04-22 DIAGNOSIS — Z5112 Encounter for antineoplastic immunotherapy: Secondary | ICD-10-CM | POA: Insufficient documentation

## 2024-04-22 LAB — CBC WITH DIFFERENTIAL (CANCER CENTER ONLY)
Abs Immature Granulocytes: 0.03 K/uL (ref 0.00–0.07)
Basophils Absolute: 0.1 K/uL (ref 0.0–0.1)
Basophils Relative: 1 %
Eosinophils Absolute: 0.2 K/uL (ref 0.0–0.5)
Eosinophils Relative: 3 %
HCT: 34.5 % — ABNORMAL LOW (ref 36.0–46.0)
Hemoglobin: 11.4 g/dL — ABNORMAL LOW (ref 12.0–15.0)
Immature Granulocytes: 1 %
Lymphocytes Relative: 19 %
Lymphs Abs: 1.1 K/uL (ref 0.7–4.0)
MCH: 27.3 pg (ref 26.0–34.0)
MCHC: 33 g/dL (ref 30.0–36.0)
MCV: 82.7 fL (ref 80.0–100.0)
Monocytes Absolute: 0.6 K/uL (ref 0.1–1.0)
Monocytes Relative: 10 %
Neutro Abs: 3.8 K/uL (ref 1.7–7.7)
Neutrophils Relative %: 66 %
Platelet Count: 293 K/uL (ref 150–400)
RBC: 4.17 MIL/uL (ref 3.87–5.11)
RDW: 12.6 % (ref 11.5–15.5)
WBC Count: 5.7 K/uL (ref 4.0–10.5)
nRBC: 0 % (ref 0.0–0.2)

## 2024-04-22 LAB — CMP (CANCER CENTER ONLY)
ALT: 16 U/L (ref 0–44)
AST: 19 U/L (ref 15–41)
Albumin: 3.9 g/dL (ref 3.5–5.0)
Alkaline Phosphatase: 81 U/L (ref 38–126)
Anion gap: 7 (ref 5–15)
BUN: 15 mg/dL (ref 8–23)
CO2: 26 mmol/L (ref 22–32)
Calcium: 8.8 mg/dL — ABNORMAL LOW (ref 8.9–10.3)
Chloride: 101 mmol/L (ref 98–111)
Creatinine: 1.59 mg/dL — ABNORMAL HIGH (ref 0.44–1.00)
GFR, Estimated: 33 mL/min — ABNORMAL LOW (ref >60)
Glucose, Bld: 121 mg/dL — ABNORMAL HIGH (ref 70–99)
Potassium: 4.1 mmol/L (ref 3.5–5.1)
Sodium: 134 mmol/L — ABNORMAL LOW (ref 135–145)
Total Bilirubin: 0.4 mg/dL (ref 0.0–1.2)
Total Protein: 6.4 g/dL — ABNORMAL LOW (ref 6.5–8.1)

## 2024-04-22 LAB — MAGNESIUM: Magnesium: 2.2 mg/dL (ref 1.7–2.4)

## 2024-04-22 MED ORDER — SODIUM CHLORIDE 0.9 % IV SOLN: Freq: Once | INTRAVENOUS | Status: AC

## 2024-04-22 MED ORDER — SODIUM CHLORIDE 0.9% FLUSH
10.0000 mL | Freq: Once | INTRAVENOUS | Status: AC
  Administered 2024-04-22: 10 mL via INTRAVENOUS

## 2024-04-22 MED ORDER — HEPARIN SOD (PORK) LOCK FLUSH 100 UNIT/ML IV SOLN
500.0000 [IU] | Freq: Once | INTRAVENOUS | Status: AC
  Administered 2024-04-22: 500 [IU] via INTRAVENOUS

## 2024-04-22 NOTE — Progress Notes (Signed)
 Per Benedict Needy OK to infuse IVFs at 999 mL/hr today

## 2024-04-22 NOTE — Patient Instructions (Addendum)
 Constipation management:  Daily maintenance: Miralax  in the morning & 2 Senokot Miralax  with dinner & 2 Senokot  Ideally stool should be the consistency of toothpaste. If your stool becomes too watery then you can adjust the frequency of Miralax  and Senokot. If stool is staying liquid then temporarily hold Miralax  and Senokot until stool becomes firm.   No bowel movement in several days require stronger intervention: Drink half a bottle of magnesium  citrate per day. Some people need to do this for a few days.   If your constipation does not improve you should follow up with PCP or GI doctor to discuss it further.

## 2024-04-22 NOTE — Patient Instructions (Signed)

## 2024-04-25 ENCOUNTER — Telehealth: Payer: Self-pay

## 2024-04-28 ENCOUNTER — Inpatient Hospital Stay (HOSPITAL_BASED_OUTPATIENT_CLINIC_OR_DEPARTMENT_OTHER): Admitting: Hematology and Oncology

## 2024-04-28 ENCOUNTER — Other Ambulatory Visit (HOSPITAL_COMMUNITY): Payer: Self-pay

## 2024-04-28 ENCOUNTER — Encounter: Payer: Self-pay | Admitting: Hematology and Oncology

## 2024-04-28 ENCOUNTER — Inpatient Hospital Stay

## 2024-04-28 VITALS — BP 165/76 | HR 74 | Temp 100.0°F | Resp 17 | Wt 154.4 lb

## 2024-04-28 DIAGNOSIS — D649 Anemia, unspecified: Secondary | ICD-10-CM | POA: Diagnosis not present

## 2024-04-28 DIAGNOSIS — C50412 Malignant neoplasm of upper-outer quadrant of left female breast: Secondary | ICD-10-CM

## 2024-04-28 DIAGNOSIS — Z171 Estrogen receptor negative status [ER-]: Secondary | ICD-10-CM

## 2024-04-28 DIAGNOSIS — C7951 Secondary malignant neoplasm of bone: Secondary | ICD-10-CM | POA: Diagnosis not present

## 2024-04-28 DIAGNOSIS — N179 Acute kidney failure, unspecified: Secondary | ICD-10-CM | POA: Diagnosis not present

## 2024-04-28 DIAGNOSIS — R5383 Other fatigue: Secondary | ICD-10-CM | POA: Diagnosis not present

## 2024-04-28 DIAGNOSIS — Z5112 Encounter for antineoplastic immunotherapy: Secondary | ICD-10-CM | POA: Diagnosis not present

## 2024-04-28 MED ORDER — SODIUM CHLORIDE 0.9 % IV SOLN: Freq: Once | INTRAVENOUS | Status: AC

## 2024-04-28 MED ORDER — HEPARIN SOD (PORK) LOCK FLUSH 100 UNIT/ML IV SOLN
500.0000 [IU] | Freq: Once | INTRAVENOUS | Status: AC | PRN
  Administered 2024-04-28: 500 [IU]

## 2024-04-28 MED ORDER — SODIUM CHLORIDE 0.9% FLUSH
10.0000 mL | INTRAVENOUS | Status: DC | PRN
  Administered 2024-04-28: 10 mL

## 2024-04-28 MED ORDER — SENNA 8.6 MG PO TABS
1.0000 | ORAL_TABLET | Freq: Three times a day (TID) | ORAL | 3 refills | Status: AC
  Filled 2024-04-28: qty 120, 40d supply, fill #0
  Filled 2024-06-02: qty 90, 30d supply, fill #1
  Filled 2024-08-08: qty 90, 30d supply, fill #2
  Filled 2024-09-15: qty 90, 30d supply, fill #3

## 2024-04-28 MED ORDER — ACETAMINOPHEN 325 MG PO TABS
650.0000 mg | ORAL_TABLET | Freq: Once | ORAL | Status: AC
  Administered 2024-04-28: 650 mg via ORAL
  Filled 2024-04-28: qty 2

## 2024-04-28 MED ORDER — DIPHENHYDRAMINE HCL 25 MG PO CAPS
25.0000 mg | ORAL_CAPSULE | Freq: Once | ORAL | Status: AC
  Administered 2024-04-28: 25 mg via ORAL
  Filled 2024-04-28: qty 1

## 2024-04-28 MED ORDER — TRASTUZUMAB-DKST CHEMO 150 MG IV SOLR
6.0000 mg/kg | Freq: Once | INTRAVENOUS | Status: AC
  Administered 2024-04-28: 420 mg via INTRAVENOUS
  Filled 2024-04-28: qty 20

## 2024-04-28 NOTE — Progress Notes (Signed)
 Patient Care Team: Loreli Kins, MD as PCP - General (Family Medicine) Georjean Darice HERO, MD as Consulting Physician (Neurology) Glean Stephane BROCKS, RN (Inactive) as Oncology Nurse Navigator Tyree Nanetta SAILOR, RN as Oncology Nurse Navigator Vernetta Berg, MD as Consulting Physician (General Surgery) Odean Potts, MD as Consulting Physician (Hematology and Oncology) Izell Domino, MD as Attending Physician (Radiation Oncology)  DIAGNOSIS:  Encounter Diagnosis  Name Primary?   Malignant neoplasm of upper-outer quadrant of left breast in female, estrogen receptor negative (HCC) Yes    SUMMARY OF ONCOLOGIC HISTORY: Oncology History  Malignant neoplasm of upper-outer quadrant of left breast in female, estrogen receptor negative (HCC)  02/10/2022 Initial Diagnosis   Palpable left breast masses and calcifications: 1.8 cm at 1:00 and 1.7 cm at 9:00, calcifications not biopsy.  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 Report date is 03/05/2022.  The CustomNext-Cancer+RNAinsight panel offered by [facility] includes sequencing and rearrangement analysis for the following 48 genes:  APC, ATM, AXIN2, BARD1, BMPR1A, BRCA1, BRCA2, BRIP1, CDH1, CDK4, CDKN2A, CHEK2, CTNNA1, DICER1, EGFR, EPCAM, GREM1, HOXB13, KIT, MEN1, MLH1, MSH2, MSH3, MSH6, MUTYH, NBN, NF1, NTHL1, PALB2, PDGFRA, PMS2, POLD1, POLE, PTEN, RAD50, RAD51C, RAD51D, SDHA, SDHB, SDHC, SDHD, SMAD4, SMARCA4, STK11, TP53, TSC1, TSC2, and VHL.  RNA data is routinely analyzed for use in variant interpretation for all genes.   04/07/2022 Surgery    Left mastectomy: 4.2 cm invasive pleomorphic lobular carcinoma grade 2, LCIS, 5/5 lymph nodes positive with extranodal extension, margins negative ER 0%, PR 0%, HER2 3+, Ki-67 20%   04/27/2022 Cancer Staging   Staging form: Breast, AJCC 8th Edition - Pathologic: Stage IIIA (pT2, pN2, cM0, G2, ER-, PR-, HER2+) - Signed by Odean Potts, MD on 04/27/2022 Histologic grading system: 3 grade system   05/05/2022 - 05/26/2022 Chemotherapy   Patient is on Treatment Plan : BREAST DOCEtaxel  + Trastuzumab  + Pertuzumab  (THP) q21d x 8 cycles / Trastuzumab  + Pertuzumab  q21d x 4 cycles     05/26/2022 -  Chemotherapy   Patient is on Treatment Plan : BREAST MAINTENANCE Trastuzumab  IV (6) or SQ (600) D1 q21d X 11 Cycles     06/12/2023 Cancer Staging   Staging form: Breast, AJCC 8th Edition - Pathologic: Stage IV (cM1) - Signed by Crawford Morna Pickle, NP on 06/12/2023     CHIEF COMPLIANT: Follow-up to discuss her treatment plan  HISTORY OF PRESENT ILLNESS:  History of Present Illness Allison Pierce is a 78 year old female with cancer who presents with concerns about her cancer and bone pain. She is accompanied by an unnamed caregiver who assists with her care.  She is concerned about the progression and management of her cancer, particularly regarding her [name]  treatment, having completed 29 cycles. She worries about the frequency and potential side effects of this regimen.  She experiences severe lower back pain, affecting her ability to rise without assistance. There is leg weakness impacting her mobility, with occasional stumbling. An MRI in June revealed a lesion on L5. She receives bone infusions, which she associates with feeling unwell.  Her kidney function is a  concern, with fluctuating creatinine levels over the past five months. She was hospitalized in February, with recent slight improvement in creatinine levels. There is no associated pain, but she is aware of the importance of  maintaining kidney health.     ALLERGIES:  is allergic to oxybutynin, prozac [fluoxetine hcl], ritalin [methylphenidate], rosuvastatin, and lyrica [pregabalin].  MEDICATIONS:  Current Outpatient Medications  Medication Sig Dispense Refill   bisacodyl  (DULCOLAX) 5 MG EC tablet Take 1 tablet (5 mg total) by mouth daily as needed for moderate constipation. 60 tablet 3   buPROPion  (WELLBUTRIN  XL) 300 MG 24 hr tablet Take 300 mg by mouth in the morning.     clonazePAM  (KLONOPIN ) 1 MG tablet Take 1 mg by mouth at bedtime.     diphenhydrAMINE  (BENADRYL ) 25 MG tablet Take 25 mg by mouth in the morning and at bedtime.     fluticasone  (FLONASE ) 50 MCG/ACT nasal spray Place 1 spray into both nostrils daily as needed for allergies.     promethazine -dextromethorphan  (PROMETHAZINE -DM) 6.25-15 MG/5ML syrup Take 5 mLs by mouth 4 (four) times daily as needed for cough. 180 mL 0   traZODone  (DESYREL ) 150 MG tablet Take 225 mg by mouth at bedtime.     venlafaxine  XR (EFFEXOR -XR) 75 MG 24 hr capsule Take 75 mg by mouth daily with breakfast.     No current facility-administered medications for this visit.    PHYSICAL EXAMINATION: ECOG PERFORMANCE STATUS: 1 - Symptomatic but completely ambulatory  Vitals:   04/28/24 1357 04/28/24 1358  BP: (!) 168/85 (!) 165/76  Pulse: 74   Resp: 17   Temp: 100 F (37.8 C)   SpO2: 100%    Filed Weights   04/28/24 1357  Weight: 154 lb 6.4 oz (70 kg)    LABORATORY DATA:  I have reviewed the data as listed    Latest Ref Rng & Units 04/22/2024    9:31 AM 03/31/2024    4:18 PM 01/28/2024    2:35 PM  CMP  Glucose 70 - 99 mg/dL 878  865    BUN 8 - 23 mg/dL 15  22    Creatinine 9.55 - 1.00 mg/dL 8.40  8.33  8.89   Sodium 135 - 145 mmol/L 134  133    Potassium 3.5 - 5.1 mmol/L 4.1  4.5    Chloride 98 - 111 mmol/L 101  101    CO2 22 - 32 mmol/L 26  27    Calcium 8.9 - 10.3 mg/dL 8.8  8.8    Total Protein 6.5 - 8.1 g/dL 6.4  6.2    Total Bilirubin 0.0 - 1.2 mg/dL  0.4  0.4    Alkaline Phos 38 - 126 U/L 81  81    AST 15 - 41 U/L 19  20    ALT 0 - 44 U/L 16  17      Lab Results  Component Value Date   WBC 5.7 04/22/2024   HGB 11.4 (L) 04/22/2024   HCT 34.5 (L) 04/22/2024   MCV 82.7 04/22/2024   PLT 293 04/22/2024   NEUTROABS 3.8 04/22/2024    ASSESSMENT & PLAN:  Malignant neoplasm of upper-outer quadrant of left breast in female, estrogen receptor negative (HCC) 04/07/2022:Left mastectomy: 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  T4 vertebral body Bone scan 04/25/2022: Abnormal uptake in the sternum, right parietal region of calvarium, right posterior seventh rib, T5, left sternoclavicular joint 07/30/2022: Bone scan: Interval resolution/decreasing accumulation of multiple bone metastases 10/30/2022: CT CAP: Mildly prominent mediastinal lymph nodes are similar, no pulmonary metastasis, skeletal lesions increasing sclerosis, no visceral metastases 01/16/2023: CT CAP: Stable prominent mediastinal lymph nodes, slight increase in the small nodes in the mesentery could be reactive, scattered bone metastases stable 05/16/2023: CT CAP: Stable bone metastases, unchanged pretracheal lymph nodes.  Previous mesenteric lymph nodes not conspicuous.  Diffuse colonic mucosal thickening: Colitis   Treatment: Palliative chemotherapy with Taxotere  [name]  and Perjeta  every 3 weeks x1 cycle given 05/05/2022 (discontinued for severe toxicities and hospitalization)   ---------------------------------------------------------------------------------------------------------------------------------------------- Chemotoxicities: Hospitalization 05/11/2022-05/18/2022: Convulsive syncope, chemo induced neutropenia, oral mucositis, chronic  CHF, severe diarrhea with dehydration ECHO 06/26/22: EF 55-60% (follows with Bensimhon) Hospitalization 11/18/2023-11/23/2023: COVID-19, hyponatremia and metabolic acidosis   Treatment eojw:1/74/76 [name]  maintenance.  (Added Perjeta  06/21/22-discontinued 06/2023 secondary to colitis) CT CAP 05/16/2023: Bone metastases unchanged, unchanged pretracheal lymph nodes, colitis CT CAP 09/17/2023: Unchanged bone metastases. CT CAP 01/31/2024: Unchanged bone metastases, no evidence of soft tissue metastatic disease Lumbar spine MRI 03/07/2024: New 2.5 cm L5 transverse process suspicious for metastases.  Versus degenerative change.  L3 Schmorl's node enlarged from 09/17/2023 which could be the cause of the back pain.   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 Buckley.  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  I also discontinued Zometa  because she thinks that she is getting more fatigued after the Zometa  infusion.   Assessment & Plan Malignant neoplasm of upper-outer quadrant of left breast HER2-positive breast cancer with stable bone metastases. Recent MRI shows indeterminate spot on L5, unchanged from previous scans. - Continue [name]  every five weeks for six months, then consider extending to every six weeks. - Monitor cancer progression with regular scans. - Educated on [name] 's role in inhibiting cancer cell growth.  Chronic kidney disease, unspecified Fluctuating creatinine levels with recent improvement. Potential causes include contrast from scans, medications, and hypertension. Bone infusion medication may affect kidney function. - Hold Zemaira infusion to  assess impact on kidney function. - Avoid contrast in future scans. - Control blood pressure and ensure adequate hydration. - Review medications to avoid nephrotoxic agents like ibuprofen.  Constipation Persistent constipation since colonoscopy, not attributed to [name] . Inconsistent use of Dulcolax and other remedies. - Prescribe Dulcolax up to three times daily as needed. - Encourage regular use of stool softeners.      No orders of the defined types were placed in this encounter.  The patient has a good understanding of the overall plan. she agrees with it. she will call with any problems that may develop before the next visit here. Total time spent: 30 mins including face to face time and time spent for planning, charting and co-ordination of care   Allison MARLA Chad, MD 04/28/24

## 2024-04-28 NOTE — Assessment & Plan Note (Signed)
 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  T4 vertebral body Bone scan 04/25/2022: Abnormal uptake in the sternum, right parietal region of calvarium, right posterior seventh rib, T5, left sternoclavicular joint 07/30/2022: Bone scan: Interval resolution/decreasing accumulation of multiple bone metastases 10/30/2022: CT CAP: Mildly prominent mediastinal lymph nodes are similar, no pulmonary metastasis, skeletal lesions increasing sclerosis, no visceral metastases 01/16/2023: CT CAP: Stable prominent mediastinal lymph nodes, slight increase in the small nodes in the mesentery could be reactive, scattered bone metastases stable 05/16/2023: CT CAP: Stable bone metastases, unchanged pretracheal lymph nodes.  Previous mesenteric lymph nodes not conspicuous.  Diffuse colonic mucosal thickening: Colitis   Treatment: Palliative chemotherapy with Taxotere  [name]  and Perjeta  every 3 weeks x1 cycle given 05/05/2022 (discontinued for severe toxicities and hospitalization)   ---------------------------------------------------------------------------------------------------------------------------------------------- Chemotoxicities: Hospitalization 05/11/2022-05/18/2022: Convulsive syncope, chemo induced neutropenia, oral mucositis, chronic CHF, severe diarrhea with dehydration ECHO 06/26/22: EF 55-60% (follows with Bensimhon) Hospitalization 11/18/2023-11/23/2023: COVID-19, hyponatremia and metabolic acidosis   Treatment eojw:1/74/76 [name]  maintenance.  (Added Perjeta  06/21/22-discontinued 06/2023 secondary to colitis) CT CAP 05/16/2023: Bone metastases unchanged, unchanged pretracheal lymph nodes, colitis CT CAP 09/17/2023: Unchanged bone  metastases. CT CAP 01/31/2024: Unchanged bone metastases, no evidence of soft tissue metastatic disease Lumbar spine MRI 03/07/2024: New 2.5 cm L5 transverse process suspicious for metastases.  Versus degenerative change.  L3 Schmorl's node enlarged from 09/17/2023 which could be the cause of the back pain.   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 Buckley.  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  Continue with the current treatment.

## 2024-04-28 NOTE — Patient Instructions (Signed)
 [facility] - A DEPT OF [facility]  Discharge Instructions: Thank you for choosing [facility] to provide your oncology and hematology care.   If you have a lab appointment with the Cancer Center, please go directly to the Cancer Center and check in at the registration area.   Wear comfortable clothing and clothing appropriate for easy access to any Portacath or PICC line.   We strive to give you quality time with your provider. You may need to reschedule your appointment if you arrive late (15 or more minutes).  Arriving late affects you and other patients whose appointments are after yours.  Also, if you miss three or more appointments without notifying the office, you may be dismissed from the clinic at the provider's discretion.      For prescription refill requests, have your pharmacy contact our office and allow 72 hours for refills to be completed.    Today you received the following chemotherapy and/or immunotherapy agents: Ogivri      To help prevent nausea and vomiting after your treatment, we encourage you to take your nausea medication as directed.  BELOW ARE SYMPTOMS THAT SHOULD BE REPORTED IMMEDIATELY: *FEVER GREATER THAN 100.4 F (38 C) OR HIGHER *CHILLS OR SWEATING *NAUSEA AND VOMITING THAT IS NOT CONTROLLED WITH YOUR NAUSEA MEDICATION *UNUSUAL SHORTNESS OF BREATH *UNUSUAL BRUISING OR BLEEDING *URINARY PROBLEMS (pain or burning when urinating, or frequent urination) *BOWEL PROBLEMS (unusual diarrhea, constipation, pain near the anus) TENDERNESS IN MOUTH AND THROAT WITH OR WITHOUT PRESENCE OF ULCERS (sore throat, sores in mouth, or a toothache) UNUSUAL RASH, SWELLING OR PAIN  UNUSUAL VAGINAL DISCHARGE OR ITCHING   Items with * indicate a potential emergency and should be followed up as soon as possible or go to the Emergency Department if any problems should occur.  Please show the CHEMOTHERAPY ALERT CARD or IMMUNOTHERAPY  ALERT CARD at check-in to the Emergency Department and triage nurse.  Should you have questions after your visit or need to cancel or reschedule your appointment, please contact [facility] - A DEPT OF [facility]  Dept: [phone]  and follow the prompts.  Office hours are 8:00 a.m. to 4:30 p.m. Monday - Friday. Please note that voicemails left after 4:00 p.m. may not be returned until the following business day.  We are closed weekends and major holidays. You have access to a nurse at all times for urgent questions. Please call the main number to the clinic Dept: [phone] and follow the prompts.   For any non-urgent questions, you may also contact your provider using MyChart. We now offer e-Visits for anyone 65 and older to request care online for non-urgent symptoms. For details visit [email or web address].   Also download the MyChart app! Go to the app store, search "MyChart", open the app, select [place], and log in with your MyChart username and password.

## 2024-05-13 ENCOUNTER — Other Ambulatory Visit: Payer: Self-pay | Admitting: *Deleted

## 2024-05-13 ENCOUNTER — Ambulatory Visit (HOSPITAL_COMMUNITY)
Admission: RE | Admit: 2024-05-13 | Discharge: 2024-05-13 | Disposition: A | Discharge status: Home / Self Care | Source: Ambulatory Visit | Attending: Hematology and Oncology | Admitting: Hematology and Oncology

## 2024-05-13 DIAGNOSIS — M25511 Pain in right shoulder: Secondary | ICD-10-CM | POA: Diagnosis not present

## 2024-05-13 DIAGNOSIS — M19011 Primary osteoarthritis, right shoulder: Secondary | ICD-10-CM | POA: Diagnosis not present

## 2024-05-13 NOTE — Progress Notes (Signed)
 Received call from pt with complaint of right shoulder pain radiating down x1 week.  Pt denies recent injury or trauma.  Per MD pt needing xrays and [facility] visit.  Orders placed, appt scheduled, pt notified and verbalized understanding.

## 2024-05-14 ENCOUNTER — Other Ambulatory Visit (HOSPITAL_COMMUNITY): Payer: Self-pay

## 2024-05-14 ENCOUNTER — Encounter: Payer: Self-pay | Admitting: Hematology and Oncology

## 2024-05-14 ENCOUNTER — Inpatient Hospital Stay: Attending: Hematology and Oncology | Admitting: Physician Assistant

## 2024-05-14 VITALS — BP 176/80 | HR 71 | Temp 97.7°F | Resp 16 | Ht 66.0 in | Wt 156.8 lb

## 2024-05-14 DIAGNOSIS — C50412 Malignant neoplasm of upper-outer quadrant of left female breast: Secondary | ICD-10-CM | POA: Insufficient documentation

## 2024-05-14 DIAGNOSIS — C7951 Secondary malignant neoplasm of bone: Secondary | ICD-10-CM | POA: Diagnosis not present

## 2024-05-14 DIAGNOSIS — Z171 Estrogen receptor negative status [ER-]: Secondary | ICD-10-CM | POA: Diagnosis not present

## 2024-05-14 DIAGNOSIS — M25511 Pain in right shoulder: Secondary | ICD-10-CM | POA: Diagnosis not present

## 2024-05-14 MED ORDER — OXYCODONE HCL 5 MG PO TABS
5.0000 mg | ORAL_TABLET | Freq: Four times a day (QID) | ORAL | 0 refills | Status: DC | PRN
  Filled 2024-05-14: qty 30, 4d supply, fill #0

## 2024-05-14 NOTE — Progress Notes (Signed)
 [facility] [phone]   [phone]  SYMPTOM MANAGEMENT VISIT  Patient Care Team: Loreli Kins, MD as PCP - General (Family Medicine) Georjean Darice HERO, MD as Consulting Physician (Neurology) Glean Stephane BROCKS, RN (Inactive) as Oncology Nurse Navigator Tyree Nanetta SAILOR, RN as Oncology Nurse Navigator Vernetta Berg, MD as Consulting Physician (General Surgery) Odean Potts, MD as Consulting Physician (Hematology and Oncology) Izell Domino, MD as Attending Physician (Radiation Oncology)  CHIEF COMPLAINTS/PURPOSE OF CONSULTATION:  Right shoulder pain  Oncology History  Malignant neoplasm of upper-outer quadrant of left breast in female, estrogen receptor negative (HCC)  02/10/2022 Initial Diagnosis   Palpable left breast masses and calcifications: 1.8 cm at 1:00 and 1.7 cm at 9:00, calcifications not biopsy.  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 Report date is 03/05/2022.  The CustomNext-Cancer+RNAinsight panel offered by [facility] includes sequencing and rearrangement analysis for the following 48 genes:  APC, ATM, AXIN2, BARD1, BMPR1A, BRCA1, BRCA2, BRIP1, CDH1, CDK4, CDKN2A, CHEK2, CTNNA1, DICER1, EGFR, EPCAM, GREM1, HOXB13, KIT, MEN1, MLH1, MSH2, MSH3, MSH6, MUTYH, NBN, NF1, NTHL1, PALB2, PDGFRA, PMS2, POLD1, POLE, PTEN, RAD50, RAD51C, RAD51D, SDHA, SDHB, SDHC, SDHD, SMAD4, SMARCA4, STK11, TP53, TSC1, TSC2, and VHL.  RNA data is routinely analyzed for use in variant interpretation for all genes.   04/07/2022 Surgery   Left mastectomy: 4.2 cm  invasive pleomorphic lobular carcinoma grade 2, LCIS, 5/5 lymph nodes positive with extranodal extension, margins negative ER 0%, PR 0%, HER2 3+, Ki-67 20%   04/27/2022 Cancer Staging   Staging form: Breast, AJCC 8th Edition - Pathologic: Stage IIIA (pT2, pN2, cM0, G2, ER-, PR-, HER2+) - Signed by Odean Potts, MD on 04/27/2022 Histologic grading system: 3 grade system   05/05/2022 - 05/26/2022 Chemotherapy   Patient is on Treatment Plan : BREAST DOCEtaxel  + Trastuzumab  + Pertuzumab  (THP) q21d x 8 cycles / Trastuzumab  + Pertuzumab  q21d x 4 cycles     05/26/2022 -  Chemotherapy   Patient is on Treatment Plan : BREAST MAINTENANCE Trastuzumab  IV (6) or SQ (600) D1 q21d X 11 Cycles     06/12/2023 Cancer Staging   Staging form: Breast, AJCC 8th Edition - Pathologic: Stage IV (cM1) - Signed by Crawford Morna Pickle, NP on 06/12/2023    CURRENT TREATMENT: Trastuzumab   HISTORY OF PRESENTING ILLNESS:  Allison Pierce 78 y.o. female presents for a [facility] visit due to ongoing right shoulder pain.   Ms. Smolinski reports that she started to have right, posterior shoulder pain starting 2 weeks ago that has progressively worsened in the last 3 days.  She rates the pain as 9 out of 10 on a pain scale.  She reports the pain is worse with movement of her upper extremity.  She does share that she did have a fall approximately 5 months ago and she landed on her right shoulder but did not have any pain until now.  She has tried over-the-counter Aleve and Tylenol  with no improvement.  She reports her fatigue has worsened mainly due to pain as it is impacting her sleep.  She denies any radiculopathy  or neuropathy down her right upper extremity.  She denies any changes to her grip or dexterity.  She has chronic constipation that is managed with Dulcolax as needed.  She denies easy bruising or signs of active bleeding.  Patient denies fevers, chills, night sweats, shortness of breath, chest pain, cough, headaches,  dizziness, nausea or vomiting rest of the 10 point ROS as below.  MEDICAL HISTORY:  Past Medical History:  Diagnosis Date   Anxiety    C. difficile diarrhea    Depression    GERD (gastroesophageal reflux disease) OCCASIONALLY  TAKE TUMS   Human papilloma virus    Hyperlipidemia    Hypertension    left breast ca 12/2021   UC (ulcerative colitis) (HCC)    UTI (urinary tract infection)    Vulvar lesion     SURGICAL HISTORY: Past Surgical History:  Procedure Laterality Date   ABDOMINAL HYSTERECTOMY  1992   W/ SALPINGO-OOPHORECTOMY   ANTERIOR CERVICAL DECOMP/DISCECTOMY FUSION  01-18-2009  DR POOLE   C4  - C6   AUGMENTATION MAMMAPLASTY Bilateral    BREAST BIOPSY Right    BREAST BIOPSY Left 02/10/2022   x2   BREAST ENHANCEMENT SURGERY  2011   BREAST EXCISIONAL BIOPSY Left    BREAST RECONSTRUCTION WITH PLACEMENT OF TISSUE EXPANDER AND FLEX HD (ACELLULAR HYDRATED DERMIS) Left 04/07/2022   Procedure: BREAST RECONSTRUCTION WITH PLACEMENT OF TISSUE EXPANDER AND FLEX HD (ACELLULAR HYDRATED DERMIS);  Surgeon: Elisabeth Craig RAMAN, MD;  Location: [facility] OR;  Service: Plastics;  Laterality: Left;   BREAST SURGERY  02-04-2011  dr merrilyn   EXCISION LEFT BREAST MASS--  CALCIFICATION   EYE SURGERY Bilateral    cataract removal   MASTECTOMY W/ SENTINEL NODE BIOPSY Left 04/07/2022   Procedure: LEFT MASTECTOMY WITH SENTINEL NODE BIOPSY;  Surgeon: Vernetta Berg, MD;  Location: [facility] OR;  Service: General;  Laterality: Left;  LMA   PORTACATH PLACEMENT N/A 04/07/2022   Procedure: PORT-A-CATH INSERTION WITH ULTRASOUND GUIDANCE;  Surgeon: Vernetta Berg, MD;  Location: [facility] OR;  Service: General;  Laterality: N/A;   REMOVAL OF TISSUE EXPANDER AND PLACEMENT OF IMPLANT Left 03/16/2023   Procedure: Removal of left breast tissue expander and placement of permanent implant;  Surgeon: Waddell Leonce NOVAK, MD;  Location: [facility];  Service: Plastics;  Laterality: Left;   VULVAR LESION REMOVAL  09/10/2012    Procedure: VULVAR LESION;  Surgeon: Carlin LELON Forbes, MD;  Location: [facility];  Service: Gynecology;  Laterality: N/A;  WIDE EXCISION OF VULVAR LESION    SOCIAL HISTORY: Social History   Socioeconomic History   Marital status: Married    Spouse name: Lynwood   Number of children: 1   Years of education: 12   Highest education level: Not on file  Occupational History   Occupation: retired  Tobacco Use   Smoking status: Never   Smokeless tobacco: Never  Vaping Use   Vaping status: Never Used  Substance and Sexual Activity   Alcohol use: Yes    Alcohol/week: 7.0 standard drinks of alcohol    Types: 7 Cans of beer per week   Drug use: Never   Sexual activity: Not Currently  Other Topics Concern   Not on file  Social History Narrative   Graduated HS      Right handed      Lives with husband   Social Drivers of Health   Financial Resource Strain: Low Risk  (02/22/2022)   Overall Financial Resource Strain (CARDIA)  Difficulty of Paying Living Expenses: Not hard at all  Food Insecurity: No Food Insecurity (11/25/2023)   Hunger Vital Sign    Worried About Running Out of Food in the Last Year: Never true    Ran Out of Food in the Last Year: Never true  Transportation Needs: No Transportation Needs (11/25/2023)   PRAPARE - Administrator, Civil Service (Medical): No    Lack of Transportation (Non-Medical): No  Physical Activity: Not on file  Stress: Not on file  Social Connections: Patient Declined (11/25/2023)   Social Connection and Isolation Panel    Frequency of Communication with Friends and Family: Patient declined    Frequency of Social Gatherings with Friends and Family: Patient declined    Attends Religious Services: Patient declined    Database administrator or Organizations: Patient declined    Attends Banker Meetings: Patient declined    Marital Status: Patient declined  Intimate Partner Violence: Not At Risk (11/25/2023)    Humiliation, Afraid, Rape, and Kick questionnaire    Fear of Current or Ex-Partner: No    Emotionally Abused: No    Physically Abused: No    Sexually Abused: No    FAMILY HISTORY: Family History  Problem Relation Age of Onset   Lung cancer Mother 4       she smoked   Prostate cancer Brother 35   Breast cancer Cousin        paternal first cousin   Breast cancer Cousin        paternal first cousin   Liver disease Neg Hx    Esophageal cancer Neg Hx    Colon cancer Neg Hx     ALLERGIES:  is allergic to oxybutynin, prozac [fluoxetine hcl], ritalin [methylphenidate], rosuvastatin, and lyrica [pregabalin].  MEDICATIONS:  Current Outpatient Medications  Medication Sig Dispense Refill   bisacodyl  (DULCOLAX) 5 MG EC tablet Take 1 tablet (5 mg total) by mouth daily as needed for moderate constipation. 60 tablet 3   buPROPion  (WELLBUTRIN  XL) 300 MG 24 hr tablet Take 300 mg by mouth in the morning.     clonazePAM  (KLONOPIN ) 1 MG tablet Take 1 mg by mouth at bedtime.     diphenhydrAMINE  (BENADRYL ) 25 MG tablet Take 25 mg by mouth in the morning and at bedtime.     fluticasone  (FLONASE ) 50 MCG/ACT nasal spray Place 1 spray into both nostrils daily as needed for allergies.     promethazine -dextromethorphan  (PROMETHAZINE -DM) 6.25-15 MG/5ML syrup Take 5 mLs by mouth 4 (four) times daily as needed for cough. 180 mL 0   senna (SENOKOT) 8.6 MG TABS tablet Take 1 tablet (8.6 mg total) by mouth 3 (three) times daily. 120 tablet 3   traZODone  (DESYREL ) 150 MG tablet Take 225 mg by mouth at bedtime.     venlafaxine  XR (EFFEXOR -XR) 75 MG 24 hr capsule Take 75 mg by mouth daily with breakfast.     No current facility-administered medications for this visit.    REVIEW OF SYSTEMS:   Constitutional: ( - ) fevers, ( - )  chills , ( - ) night sweats Eyes: ( - ) blurriness of vision, ( - ) double vision, ( - ) watery eyes Ears, nose, mouth, throat, and face: ( - ) mucositis, ( - ) sore  throat Respiratory: ( - ) cough, ( - ) dyspnea, ( - ) wheezes Cardiovascular: ( - ) palpitation, ( - ) chest discomfort, ( - ) lower extremity swelling Gastrointestinal:  ( - )  nausea, ( - ) heartburn, ( - ) change in bowel habits Skin: ( - ) abnormal skin rashes Lymphatics: ( - ) new lymphadenopathy, ( - ) easy bruising Neurological: ( - ) numbness, ( - ) tingling, ( - ) new weaknesses Behavioral/Psych: ( - ) mood change, ( - ) new changes  All other systems were reviewed with the patient and are negative.  PHYSICAL EXAMINATION: ECOG PERFORMANCE STATUS: 1 - Symptomatic but completely ambulatory  Vitals:   05/14/24 0846  BP: (!) 176/80  Pulse: 71  Resp: 16  Temp: 97.7 F (36.5 C)  SpO2: 99%   Filed Weights   05/14/24 0846  Weight: 156 lb 12.8 oz (71.1 kg)    GENERAL: well appearing female in NAD  SKIN: skin color, texture, turgor are normal, no rashes or significant lesions EYES: conjunctiva are pink and non-injected, sclera clear LYMPH:  no palpable lymphadenopathy in the cervical or supraclavicular lymph nodes.  LUNGS: clear to auscultation and percussion with normal breathing effort HEART: regular rate & rhythm and no murmurs and no lower extremity edema Musculoskeletal: no cyanosis of digits and no clubbing.  Point tenderness to the right posterior shoulder.  Reproduced pain with anterior and lateral movement of her right upper extremity.  No palpable mass.  Noticeable asymmetry with posture left shoulder higher than right shoulder. PSYCH: alert & oriented x 3, fluent speech NEURO: no focal motor/sensory deficits.  No strength deficiency in her right upper extremity.  LABORATORY DATA:  I have reviewed the data as listed    Latest Ref Rng & Units 04/22/2024    9:31 AM 03/31/2024    4:18 PM 11/29/2023   12:16 PM  CBC  WBC 4.0 - 10.5 K/uL 5.7  6.1  10.1   Hemoglobin 12.0 - 15.0 g/dL 88.5  89.1  88.6   Hematocrit 36.0 - 46.0 % 34.5  32.3  34.8   Platelets 150 - 400 K/uL  293  237  345        Latest Ref Rng & Units 04/22/2024    9:31 AM 03/31/2024    4:18 PM 01/28/2024    2:35 PM  CMP  Glucose 70 - 99 mg/dL 878  865    BUN 8 - 23 mg/dL 15  22    Creatinine 9.55 - 1.00 mg/dL 8.40  8.33  8.89   Sodium 135 - 145 mmol/L 134  133    Potassium 3.5 - 5.1 mmol/L 4.1  4.5    Chloride 98 - 111 mmol/L 101  101    CO2 22 - 32 mmol/L 26  27    Calcium 8.9 - 10.3 mg/dL 8.8  8.8    Total Protein 6.5 - 8.1 g/dL 6.4  6.2    Total Bilirubin 0.0 - 1.2 mg/dL 0.4  0.4    Alkaline Phos 38 - 126 U/L 81  81    AST 15 - 41 U/L 19  20    ALT 0 - 44 U/L 16  17       RADIOGRAPHIC STUDIES: I have personally reviewed the radiological images as listed and agreed with the findings in the report. DG Chest 2 View Result Date: 05/13/2024 CLINICAL DATA:  Right shoulder pain EXAM: CHEST - 2 VIEW COMPARISON:  Chest x-ray 11/25/2023 FINDINGS: Right chest port catheter tip ends proximal SVC, unchanged. Surgical clips overlie the left axilla. The lungs are clear. There is no pleural effusion or pneumothorax. The cardiomediastinal silhouette is within normal limits. Right breast implant  present. No acute osseous abnormality. IMPRESSION: No active cardiopulmonary disease. Electronically Signed   By: Greig Pique M.D.   On: 05/13/2024 15:41   DG Shoulder Right Result Date: 05/13/2024 CLINICAL DATA:  Right shoulder pain EXAM: RIGHT SHOULDER - 2+ VIEW COMPARISON:  None Available. FINDINGS: There is no evidence of fracture or dislocation. There are mild degenerative changes of the acromioclavicular joint with joint space narrowing and osteophyte formation. The glenohumeral joint space is maintained. Right chest port is present as well as cervical spinal fusion plate. IMPRESSION: Mild degenerative changes of the acromioclavicular joint. Electronically Signed   By: Greig Pique M.D.   On: 05/13/2024 15:40    ASSESSMENT & PLAN Koraima Ta Fair is a 78 y.o. female who presents for a [facility] visit  to evaluate right shoulder pain.   #Right posterior shoulder pain: --Xrays reviewed without any acute abnormalities.  --Due to severity of pain that is reproduced with point palpation and ambulation of right upper extremity, recommend MRI right shoulder to further evaluate. Scheduled for tomorrow.  --Send short course of oxycodone  5-10 mg q 6 hours for pain control --Will follow  up with result by phone.   #Metastatic breast cancer involving the bones: --Currently on Trastuzumab  therapy, last received on 04/28/2024. --Patient is under the care of Dr. Odean. Last CT scan from 01/28/2024 showed stable disease including sclerotic bone metastases.   No orders of the defined types were placed in this encounter.   All questions were answered. The patient knows to call the clinic with any problems, questions or concerns.  I have spent a total of 30 minutes minutes of face-to-face and non-face-to-face time, preparing to see the patient, obtaining and/or reviewing separately obtained history, performing a medically appropriate examination, counseling and educating the patient, ordering medications/tests/procedures, documenting clinical information in the electronic health record, independently interpreting results and communicating results to the patient, and care coordination.   Johnston Police, PA-C Department of Hematology/Oncology [facility] Phone: [phone]

## 2024-05-15 ENCOUNTER — Ambulatory Visit (HOSPITAL_COMMUNITY)
Admission: RE | Admit: 2024-05-15 | Discharge: 2024-05-15 | Disposition: A | Discharge status: Home / Self Care | Source: Ambulatory Visit | Attending: Physician Assistant | Admitting: Physician Assistant

## 2024-05-15 DIAGNOSIS — M7581 Other shoulder lesions, right shoulder: Secondary | ICD-10-CM | POA: Diagnosis not present

## 2024-05-15 DIAGNOSIS — M19011 Primary osteoarthritis, right shoulder: Secondary | ICD-10-CM | POA: Diagnosis not present

## 2024-05-15 DIAGNOSIS — M25711 Osteophyte, right shoulder: Secondary | ICD-10-CM | POA: Diagnosis not present

## 2024-05-15 DIAGNOSIS — M25511 Pain in right shoulder: Secondary | ICD-10-CM | POA: Insufficient documentation

## 2024-05-15 DIAGNOSIS — M25411 Effusion, right shoulder: Secondary | ICD-10-CM | POA: Diagnosis not present

## 2024-05-15 MED ORDER — GADOBUTROL 1 MMOL/ML IV SOLN
7.0000 mL | Freq: Once | INTRAVENOUS | Status: AC | PRN
  Administered 2024-05-15: 7 mL via INTRAVENOUS

## 2024-05-16 ENCOUNTER — Telehealth: Payer: Self-pay | Admitting: Physician Assistant

## 2024-05-16 DIAGNOSIS — C7951 Secondary malignant neoplasm of bone: Secondary | ICD-10-CM

## 2024-05-16 DIAGNOSIS — Z171 Estrogen receptor negative status [ER-]: Secondary | ICD-10-CM

## 2024-05-16 NOTE — Telephone Encounter (Signed)
 I spoke to [name] today to review the MRI shoulder results from yesterday. There is evidence of an indeterminate intramedullary lesion in the proximal humeral diaphysis.  This could potentially indicate a metastasis.  We will order a whole-body bone scan to further evaluate.  There was no evidence of metastatic disease in the posterior right shoulder.  We will have the patient follow-up after the bone scan with Dr. Odean to review the results

## 2024-05-29 ENCOUNTER — Encounter (HOSPITAL_COMMUNITY)
Admission: RE | Admit: 2024-05-29 | Discharge: 2024-05-29 | Disposition: A | Discharge status: Home / Self Care | Source: Ambulatory Visit | Attending: Physician Assistant | Admitting: Physician Assistant

## 2024-05-29 DIAGNOSIS — C50412 Malignant neoplasm of upper-outer quadrant of left female breast: Secondary | ICD-10-CM | POA: Insufficient documentation

## 2024-05-29 DIAGNOSIS — Z171 Estrogen receptor negative status [ER-]: Secondary | ICD-10-CM | POA: Insufficient documentation

## 2024-05-29 DIAGNOSIS — C7951 Secondary malignant neoplasm of bone: Secondary | ICD-10-CM | POA: Insufficient documentation

## 2024-05-29 DIAGNOSIS — C50919 Malignant neoplasm of unspecified site of unspecified female breast: Secondary | ICD-10-CM | POA: Diagnosis not present

## 2024-05-29 MED ORDER — TECHNETIUM TC 99M MEDRONATE IV KIT
19.6000 | PACK | Freq: Once | INTRAVENOUS | Status: AC | PRN
  Administered 2024-05-29: 19.6 via INTRAVENOUS

## 2024-06-03 ENCOUNTER — Other Ambulatory Visit: Payer: Self-pay | Admitting: *Deleted

## 2024-06-03 ENCOUNTER — Encounter: Payer: Self-pay | Admitting: Hematology and Oncology

## 2024-06-03 ENCOUNTER — Inpatient Hospital Stay: Attending: Hematology and Oncology

## 2024-06-03 ENCOUNTER — Other Ambulatory Visit: Payer: Self-pay

## 2024-06-03 ENCOUNTER — Other Ambulatory Visit (HOSPITAL_COMMUNITY): Payer: Self-pay

## 2024-06-03 VITALS — BP 163/70 | HR 78 | Temp 97.8°F | Resp 18 | Ht 66.0 in | Wt 154.0 lb

## 2024-06-03 DIAGNOSIS — Z1722 Progesterone receptor negative status: Secondary | ICD-10-CM | POA: Diagnosis not present

## 2024-06-03 DIAGNOSIS — C50412 Malignant neoplasm of upper-outer quadrant of left female breast: Secondary | ICD-10-CM | POA: Diagnosis not present

## 2024-06-03 DIAGNOSIS — N644 Mastodynia: Secondary | ICD-10-CM | POA: Insufficient documentation

## 2024-06-03 DIAGNOSIS — M25511 Pain in right shoulder: Secondary | ICD-10-CM | POA: Insufficient documentation

## 2024-06-03 DIAGNOSIS — G8929 Other chronic pain: Secondary | ICD-10-CM | POA: Insufficient documentation

## 2024-06-03 DIAGNOSIS — C7951 Secondary malignant neoplasm of bone: Secondary | ICD-10-CM | POA: Diagnosis not present

## 2024-06-03 DIAGNOSIS — E236 Other disorders of pituitary gland: Secondary | ICD-10-CM | POA: Insufficient documentation

## 2024-06-03 DIAGNOSIS — Z5112 Encounter for antineoplastic immunotherapy: Secondary | ICD-10-CM | POA: Insufficient documentation

## 2024-06-03 DIAGNOSIS — Z171 Estrogen receptor negative status [ER-]: Secondary | ICD-10-CM | POA: Diagnosis not present

## 2024-06-03 DIAGNOSIS — Z9012 Acquired absence of left breast and nipple: Secondary | ICD-10-CM | POA: Diagnosis not present

## 2024-06-03 DIAGNOSIS — M549 Dorsalgia, unspecified: Secondary | ICD-10-CM | POA: Insufficient documentation

## 2024-06-03 MED ORDER — DIPHENHYDRAMINE HCL 25 MG PO CAPS
25.0000 mg | ORAL_CAPSULE | Freq: Once | ORAL | Status: AC
  Administered 2024-06-03: 25 mg via ORAL
  Filled 2024-06-03: qty 1

## 2024-06-03 MED ORDER — ACETAMINOPHEN 325 MG PO TABS
650.0000 mg | ORAL_TABLET | Freq: Once | ORAL | Status: AC
  Administered 2024-06-03: 650 mg via ORAL
  Filled 2024-06-03: qty 2

## 2024-06-03 MED ORDER — TRASTUZUMAB-DKST CHEMO 150 MG IV SOLR
6.0000 mg/kg | Freq: Once | INTRAVENOUS | Status: AC
  Administered 2024-06-03: 420 mg via INTRAVENOUS
  Filled 2024-06-03: qty 20

## 2024-06-03 MED ORDER — SODIUM CHLORIDE 0.9 % IV SOLN: Freq: Once | INTRAVENOUS | Status: AC

## 2024-06-03 NOTE — Progress Notes (Signed)
 Per Dr. Odean ok to proceed with trastuzumb w/ ECHO from 12/04/2023

## 2024-06-03 NOTE — Patient Instructions (Signed)
 [facility] - A DEPT OF [facility]  Discharge Instructions: Thank you for choosing [facility] to provide your oncology and hematology care.   If you have a lab appointment with the Cancer Center, please go directly to the Cancer Center and check in at the registration area.   Wear comfortable clothing and clothing appropriate for easy access to any Portacath or PICC line.   We strive to give you quality time with your provider. You may need to reschedule your appointment if you arrive late (15 or more minutes).  Arriving late affects you and other patients whose appointments are after yours.  Also, if you miss three or more appointments without notifying the office, you may be dismissed from the clinic at the provider's discretion.      For prescription refill requests, have your pharmacy contact our office and allow 72 hours for refills to be completed.    Today you received the following chemotherapy and/or immunotherapy agents: Ogivri      To help prevent nausea and vomiting after your treatment, we encourage you to take your nausea medication as directed.  BELOW ARE SYMPTOMS THAT SHOULD BE REPORTED IMMEDIATELY: *FEVER GREATER THAN 100.4 F (38 C) OR HIGHER *CHILLS OR SWEATING *NAUSEA AND VOMITING THAT IS NOT CONTROLLED WITH YOUR NAUSEA MEDICATION *UNUSUAL SHORTNESS OF BREATH *UNUSUAL BRUISING OR BLEEDING *URINARY PROBLEMS (pain or burning when urinating, or frequent urination) *BOWEL PROBLEMS (unusual diarrhea, constipation, pain near the anus) TENDERNESS IN MOUTH AND THROAT WITH OR WITHOUT PRESENCE OF ULCERS (sore throat, sores in mouth, or a toothache) UNUSUAL RASH, SWELLING OR PAIN  UNUSUAL VAGINAL DISCHARGE OR ITCHING   Items with * indicate a potential emergency and should be followed up as soon as possible or go to the Emergency Department if any problems should occur.  Please show the CHEMOTHERAPY ALERT CARD or IMMUNOTHERAPY  ALERT CARD at check-in to the Emergency Department and triage nurse.  Should you have questions after your visit or need to cancel or reschedule your appointment, please contact [facility] - A DEPT OF [facility]  Dept: [phone]  and follow the prompts.  Office hours are 8:00 a.m. to 4:30 p.m. Monday - Friday. Please note that voicemails left after 4:00 p.m. may not be returned until the following business day.  We are closed weekends and major holidays. You have access to a nurse at all times for urgent questions. Please call the main number to the clinic Dept: [phone] and follow the prompts.   For any non-urgent questions, you may also contact your provider using MyChart. We now offer e-Visits for anyone 65 and older to request care online for non-urgent symptoms. For details visit [email or web address].   Also download the MyChart app! Go to the app store, search "MyChart", open the app, select [place], and log in with your MyChart username and password.

## 2024-06-04 ENCOUNTER — Other Ambulatory Visit: Payer: Self-pay

## 2024-06-04 ENCOUNTER — Telehealth (HOSPITAL_COMMUNITY): Payer: Self-pay | Admitting: Internal Medicine

## 2024-06-06 ENCOUNTER — Telehealth: Payer: Self-pay | Admitting: Hematology and Oncology

## 2024-06-09 ENCOUNTER — Other Ambulatory Visit (HOSPITAL_COMMUNITY): Payer: Self-pay

## 2024-06-10 ENCOUNTER — Inpatient Hospital Stay (HOSPITAL_BASED_OUTPATIENT_CLINIC_OR_DEPARTMENT_OTHER): Admitting: Hematology and Oncology

## 2024-06-10 DIAGNOSIS — Z171 Estrogen receptor negative status [ER-]: Secondary | ICD-10-CM

## 2024-06-10 DIAGNOSIS — M25511 Pain in right shoulder: Secondary | ICD-10-CM | POA: Diagnosis not present

## 2024-06-10 DIAGNOSIS — C50412 Malignant neoplasm of upper-outer quadrant of left female breast: Secondary | ICD-10-CM | POA: Diagnosis not present

## 2024-06-10 NOTE — Assessment & Plan Note (Signed)
 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  T4 vertebral body   Treatment: Palliative chemotherapy with Taxotere  [name]  and Perjeta  every 3 weeks x1 cycle given 05/05/2022 (discontinued for severe toxicities and hospitalization)   ---------------------------------------------------------------------------------------------------------------------------------------------- Chemotoxicities: Hospitalization 05/11/2022-05/18/2022: Convulsive syncope, chemo induced neutropenia, oral mucositis, chronic CHF, severe diarrhea with dehydration ECHO 06/26/22: EF 55-60% (follows with B[facility]im[facility]) Hospitalization 11/18/2023-11/23/2023: COVID-19, hyponatremia and metabolic acidosis   Treatment eojw:1/74/76 [name]  maintenance.  (Added Perjeta  06/21/22-discontinued 06/2023 secondary to colitis) Lumbar spine MRI 03/07/2024: New 2.5 cm L5 transverse process suspicious for metastases.  Versus degenerative change.  L3 Schmorl's node enlarged from 09/17/2023 which could be the cause of the back pain.   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 Buckley.  Brain MRI 09/20/2023: 2.6 x 7.5 mm mass of the pituitary: Stable  05/15/2024: MRI right shoulder: Indeterminate intramedullary lesion proximal humeral diaphysis 06/04/2024: Bone scan: No activity in the right humerus to correspond to the MRI findings.  New intense uptake right eighth rib and mid lumbar spine (indeterminate)

## 2024-06-10 NOTE — Progress Notes (Signed)
 HEMATOLOGY-ONCOLOGY TELEPHONE VISIT PROGRESS NOTE  I connected with our patient on 06/10/24 at  3:30 PM EDT by telephone and verified that I am speaking with the correct person using two identifiers.  I discussed the limitations, risks, security and privacy concerns of performing an evaluation and management service by telephone and the availability of in person appointments.  I also discussed with the patient that there may be a patient responsible charge related to this service. The patient expressed understanding and agreed to proceed.   History of Present Illness:    History of Present Illness Allison Pierce is a 78 year old female who presents with shoulder and back pain.  She experiences persistent pain in her shoulder, extending down her back and in the front near her breast. A recent MRI showed an abnormality on her humerus. A subsequent bone scan ruled out cancer in the right humerus but noted possible trauma to the right eighth rib and mid-back area.  She has a spot on her lumbar spine, which previous imaging suggested could be arthritis or another benign condition.    Oncology History  Malignant neoplasm of upper-outer quadrant of left breast in female, estrogen receptor negative (HCC)  02/10/2022 Initial Diagnosis   Palpable left breast masses and calcifications: 1.8 cm at 1:00 and 1.7 cm at 9:00, calcifications not biopsy.  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 Report date is 03/05/2022.  The CustomNext-Cancer+RNAinsight panel offered by [facility] includes sequencing and  rearrangement analysis for the following 48 genes:  APC, ATM, AXIN2, BARD1, BMPR1A, BRCA1, BRCA2, BRIP1, CDH1, CDK4, CDKN2A, CHEK2, CTNNA1, DICER1, EGFR, EPCAM, GREM1, HOXB13, KIT, MEN1, MLH1, MSH2, MSH3, MSH6, MUTYH, NBN, NF1, NTHL1, PALB2, PDGFRA, PMS2, POLD1, POLE, PTEN, RAD50, RAD51C, RAD51D, SDHA, SDHB, SDHC, SDHD, SMAD4, SMARCA4, STK11, TP53, TSC1, TSC2, and VHL.  RNA data is routinely analyzed for use in variant interpretation for all genes.   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, LCIS, 5/5 lymph nodes positive with extranodal extension, margins negative ER 0%, PR 0%, HER2 3+, Ki-67 20%   04/27/2022 Cancer Staging   Staging form: Breast, AJCC 8th Edition - Pathologic: Stage IIIA (pT2, pN2, cM0, G2, ER-, PR-, HER2+) - Signed by Odean Potts, MD on 04/27/2022 Histologic grading system: 3 grade system   05/05/2022 - 05/26/2022 Chemotherapy   Patient is on Treatment Plan : BREAST DOCEtaxel  + Trastuzumab  + Pertuzumab  (THP) q21d x 8 cycles / Trastuzumab  + Pertuzumab  q21d x 4 cycles     05/26/2022 -  Chemotherapy   Patient is on Treatment Plan : BREAST MAINTENANCE Trastuzumab  IV (6) or SQ (600) D1 q21d X 11 Cycles     06/12/2023 Cancer Staging   Staging form: Breast, AJCC 8th Edition - Pathologic: Stage IV (cM1) - Signed by Crawford Morna Pickle, NP on 06/12/2023     REVIEW OF SYSTEMS:   Constitutional: Denies fevers, chills or abnormal weight loss All other systems were reviewed with the patient and are negative. Observations/Objective:     Assessment Plan:  Malignant neoplasm of upper-outer quadrant of left  breast in female, estrogen receptor negative (HCC) 04/07/2022:Left mastectomy: 4.2 cm invasive pleomorphic lobular carcinoma grade 2, LCIS, 5/5 lymph nodes positive with extranodal extension, margins negative, ER 0%, PR 0%, HER2 3+, Ki-67 20%   CT CAP 04/26/2022: Lytic osseous lesions throughout body, right seventh rib, multiple  thoracic vertebral bodies, sacrum.  T4 vertebral body   Treatment: Palliative chemotherapy with Taxotere  [name]  and Perjeta  every 3 weeks x1 cycle given 05/05/2022 (discontinued for severe toxicities and hospitalization)   ---------------------------------------------------------------------------------------------------------------------------------------------- Chemotoxicities: Hospitalization 05/11/2022-05/18/2022: Convulsive syncope, chemo induced neutropenia, oral mucositis, chronic CHF, severe diarrhea with dehydration ECHO 06/26/22: EF 55-60% (follows with Bensim[facility]) Hospitalization 11/18/2023-11/23/2023: COVID-19, hyponatremia and metabolic acidosis   Treatment eojw:1/74/76 [name]  maintenance.  (Added Perjeta  06/21/22-discontinued 06/2023 secondary to colitis) Lumbar spine MRI 03/07/2024: New 2.5 cm L5 transverse process suspicious for metastases.  Versus degenerative change.  L3 Schmorl's node enlarged from 09/17/2023 which could be the cause of the back pain.   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 Buckley.  Brain MRI 09/20/2023: 2.6 x 7.5 mm mass of the pituitary: Stable  05/15/2024: MRI right shoulder: Indeterminate intramedullary lesion proximal humeral diaphysis 06/04/2024: Bone scan: No activity in the right humerus to correspond to the MRI findings.  New intense uptake right eighth rib and mid lumbar spine (indeterminate) Will refer the patient to orthopedics for evaluation for injections regarding her shoulder and neck pain. --------------------------------- Assessment and Plan Assessment & Plan History of malignant neoplasm of upper-outer quadrant of left breast Bone scan showed no malignancy in right humerus. Possible fracture in right eighth rib and mid-back likely due to trauma. Lumbar MRI suggests arthritis or benign conditions.  Chronic pain of right shoulder, back, and left breast  region Chronic pain likely due to previous trauma or non-cancerous causes per imaging. - Refer to orthopedic specialist for evaluation and management of shoulder and back pain.  Follow-Up - Confirm appointment on October 6th at 11:30 AM for infusion and follow-up visit.      I discussed the assessment and treatment plan with the patient. The patient was provided an opportunity to ask questions and all were answered. The patient agreed with the plan and demonstrated an understanding of the instructions. The patient was advised to call back or seek an in-person evaluation if the symptoms worsen or if the condition fails to improve as anticipated.   I provided 20 minutes of non-face-to-face time during this encounter.  This includes time for charting and coordination of care   Naomi MARLA Chad, MD

## 2024-06-26 ENCOUNTER — Ambulatory Visit: Admitting: Physician Assistant

## 2024-06-26 ENCOUNTER — Other Ambulatory Visit (INDEPENDENT_AMBULATORY_CARE_PROVIDER_SITE_OTHER): Payer: Self-pay

## 2024-06-26 DIAGNOSIS — M25511 Pain in right shoulder: Secondary | ICD-10-CM | POA: Diagnosis not present

## 2024-06-26 DIAGNOSIS — M542 Cervicalgia: Secondary | ICD-10-CM

## 2024-06-26 MED ORDER — TIZANIDINE HCL 4 MG PO CAPS: 4.0000 mg | ORAL_CAPSULE | Freq: Every day | ORAL | 1 refills | Status: DC

## 2024-06-26 NOTE — Progress Notes (Signed)
 Office Visit Note   Patient: Allison Pierce           Date of Birth: Nov 10, 1945           MRN: [id number] Visit Date: 06/26/2024              Requested by: Odean Potts, MD [address] [place],  [place] [address] PCP: Loreli Kins, MD   Assessment & Plan: Visit Diagnoses:  1. Neck pain   2. Acute pain of right shoulder     Plan:  Given the patient's MRI with only mild glenohumeral arthritis, mild to moderate AC joint arthritis and mild rotator cuff tendinosis along with clinical findings today.  Felt that she could in fact have some of her pain coming from her shoulder but most likely her pain is coming from her neck given the scapular pain and now the new onset of radicular symptoms down the arm on exam today.  Therefore we will send her to formal physical therapy for her cervical spine to work on range of motion and strengthening including modalities home exercise program.  They can also work on range of motion and strengthening including a home exercise program for her right shoulder along with modalities.  Will see her back in 6 weeks to see how she is doing overall.  If her condition worsens in any way she will contact our office.  Was placed on Zanaflex  to take mostly at night.  Follow-Up Instructions: Return in about 6 weeks (around 08/07/2024).   Orders:  Orders Placed This Encounter  Procedures   XR Cervical Spine 2 or 3 views   Meds ordered this encounter  Medications   tiZANidine  (ZANAFLEX ) 4 MG capsule    Sig: Take 1 capsule (4 mg total) by mouth at bedtime.    Dispense:  30 capsule    Refill:  1      Procedures: No procedures performed   Clinical Data: No additional findings.   Subjective: Chief Complaint  Patient presents with   Right Shoulder - Pain    HPI Patient is 78 year old female were seen for the first time for right shoulder pain has been ongoing for 2 months.  She comes in today already having x-rays and MRI.   Radiographs of her right shoulder were personally reviewed these are dated 05/13/2024 and show no acute fractures no acute findings.  Generative changes which are mild at the [facility] joint.  Glenohumeral joint overall well-maintained.  MRI of the right shoulder dated 05/15/2024 is reviewed and shows no rotator cuff tear and only some mild supraspinatus and infraspinatus tendinosis.  Mild glenohumeral degenerative changes and moderate AC joint changes.  Proximal humerus diaphysis there is intramedullary lesion that could possibly reflect metastasis.  However the patient underwent a nuclear medicine whole-body scan on 05/29/2024 and that showed no radiotracer activity in the proximal right humerus to correspond with the MRI findings.  Patient's oncologist sent her for treatment of her shoulder pain. Patient states again that the pains been ongoing for the past 2 months.  She did have a fall onto her shoulder.  She notes decreased range of motion of the right shoulder.  Has not had any numbness and tingling down the arm except for today.  That radiates down past the elbow.  She has had no therapy no injections.  States the pain is became better over the past couple of nights.  Before this pain was worse at night and she had to sleep on  a heating pad and often awaken her.  Pain is mostly in the right shoulder blade area.  She denies any neck pain.  Review of Systems  Constitutional:  Negative for fever.  Neurological:  Positive for numbness.     Objective: Vital Signs: There were no vitals taken for this visit.  Physical Exam Constitutional:      Appearance: She is not ill-appearing or diaphoretic.  Cardiovascular:     Pulses: Normal pulses.  Pulmonary:     Effort: Pulmonary effort is normal.  Neurological:     Mental Status: She is alert and oriented to person, place, and time.  Psychiatric:        Mood and Affect: Mood normal.     Ortho Exam Bilateral upper extremities: Full motor bilateral hands  full sensation bilateral hands.  Upper extremity strength biceps triceps 5 out of 5 against resistance.  Tenderness medial border of the right scapula only.  Bilateral shoulders: 5 out of 5 strength external and internal rotation against resistance empty can test and liftoff test is negative bilaterally.  Impingement testing positive on the right negative on the left.  Abduction bilateral shoulders causes no pain.  Nontender over the [facility] joints bilaterally. During the examination of her shoulder and neck she developed radicular symptoms down her right arm into the dorsal forearm region mainly radial. Cervical spine full flexion extension some discomfort with flexion.  Spurling's test is positive.  Nontender along cervical spinal column.   Specialty Comments:  No specialty comments available.  Imaging: XR Cervical Spine 2 or 3 views Result Date: 06/26/2024 Cervical spine 2 views: No acute fractures.  Status post cervical fusion C4-C6.  Cervical fusion appears solid with no hardware failure.  Anterior vertebral spurring at C3-C4  with loss of disc space and C6-C7 loss of disc space.  No spondylolisthesis.    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/11/2023   Metastasis to bone (HCC) 06/12/2023   Colitis 05/13/2022   Alcohol abuse 05/12/2022   Irritable bowel syndrome 05/12/2022   Mild cognitive impairment 05/12/2022   Urinary incontinence 05/12/2022   Allergic rhinitis 05/12/2022   Recurrent major depression in remission 05/12/2022   Lobular carcinoma of left breast  (HCC) 05/12/2022   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   Acute colitis 05/12/2022   Lactic acidosis 05/12/2022   Chemotherapy induced neutropenia 05/12/2022   Chronic diastolic CHF (congestive heart failure) (HCC) 05/12/2022   Oral mucositis 05/12/2022   Noninfective gastroenteritis and colitis, unspecified 05/12/2022   Port-A-Cath in place 05/05/2022   Genetic testing 03/06/2022   Family history of breast cancer 02/23/2022   Family history of prostate cancer 02/23/2022   Essential hypertension 02/22/2022   Malignant neoplasm of upper-outer quadrant of left breast in female, estrogen receptor negative (HCC) 02/20/2022   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 difficile  diarrhea    Depression    GERD (gastroesophageal reflux disease) OCCASIONALLY  TAKE TUMS   Human papilloma virus    Hyperlipidemia    Hypertension    left breast ca 12/2021   UC (ulcerative colitis) (HCC)    UTI (urinary tract infection)    Vulvar lesion     Family History  Problem Relation Age of Onset   Lung cancer Mother 3       she smoked   Prostate cancer Brother 56   Breast cancer Cousin        paternal first cousin   Breast cancer Cousin        paternal first cousin   Liver disease Neg Hx    Esophageal cancer Neg Hx    Colon cancer Neg Hx     Past Surgical History:  Procedure Laterality Date   ABDOMINAL HYSTERECTOMY  1992   W/ SALPINGO-OOPHORECTOMY   ANTERIOR CERVICAL DECOMP/DISCECTOMY FUSION  01-18-2009  DR POOLE   C4  - C6   AUGMENTATION MAMMAPLASTY Bilateral    BREAST BIOPSY Right    BREAST BIOPSY Left 02/10/2022   x2   BREAST ENHANCEMENT SURGERY  2011   BREAST EXCISIONAL BIOPSY Left    BREAST RECONSTRUCTION WITH PLACEMENT OF TISSUE  EXPANDER AND FLEX HD (ACELLULAR HYDRATED DERMIS) Left 04/07/2022   Procedure: BREAST RECONSTRUCTION WITH PLACEMENT OF TISSUE EXPANDER AND FLEX HD (ACELLULAR HYDRATED DERMIS);  Surgeon: Elisabeth Craig RAMAN, MD;  Location: [facility] OR;  Service: Plastics;  Laterality: Left;   BREAST SURGERY  02-04-2011  dr merrilyn   EXCISION LEFT BREAST MASS--  CALCIFICATION   EYE SURGERY Bilateral    cataract removal   MASTECTOMY W/ SENTINEL NODE BIOPSY Left 04/07/2022   Procedure: LEFT MASTECTOMY WITH SENTINEL NODE BIOPSY;  Surgeon: Vernetta Berg, MD;  Location: [facility] OR;  Service: General;  Laterality: Left;  LMA   PORTACATH PLACEMENT N/A 04/07/2022   Procedure: PORT-A-CATH INSERTION WITH ULTRASOUND GUIDANCE;  Surgeon: Vernetta Berg, MD;  Location: [facility] OR;  Service: General;  Laterality: N/A;   REMOVAL OF TISSUE EXPANDER AND PLACEMENT OF IMPLANT Left 03/16/2023   Procedure: Removal of left breast tissue expander and placement of permanent implant;  Surgeon: Waddell Leonce NOVAK, MD;  Location: [facility];  Service: Plastics;  Laterality: Left;   VULVAR LESION REMOVAL  09/10/2012   Procedure: VULVAR LESION;  Surgeon: Carlin LELON Forbes, MD;  Location: [facility];  Service: Gynecology;  Laterality: N/A;  WIDE EXCISION OF VULVAR LESION   Social History   Occupational History   Occupation: retired  Tobacco Use   Smoking status: Never   Smokeless tobacco: Never  Vaping Use   Vaping status: Never Used  Substance and Sexual Activity   Alcohol use: Yes    Alcohol/week: 7.0 standard drinks of alcohol    Types: 7 Cans of beer per week   Drug use: Never   Sexual activity: Not Currently

## 2024-06-27 NOTE — Addendum Note (Signed)
 Addended by: PETER FRIEZE B on: 06/27/2024 08:42 AM   Modules accepted: Orders

## 2024-07-06 ENCOUNTER — Other Ambulatory Visit: Payer: Self-pay | Admitting: Physician Assistant

## 2024-07-07 ENCOUNTER — Other Ambulatory Visit: Payer: Self-pay | Admitting: *Deleted

## 2024-07-07 ENCOUNTER — Inpatient Hospital Stay: Admitting: Hematology and Oncology

## 2024-07-07 ENCOUNTER — Other Ambulatory Visit (HOSPITAL_COMMUNITY): Payer: Self-pay

## 2024-07-07 ENCOUNTER — Inpatient Hospital Stay: Attending: Hematology and Oncology

## 2024-07-07 VITALS — BP 148/71 | HR 80 | Temp 98.0°F | Resp 16 | Wt 156.5 lb

## 2024-07-07 DIAGNOSIS — C7951 Secondary malignant neoplasm of bone: Secondary | ICD-10-CM | POA: Diagnosis not present

## 2024-07-07 DIAGNOSIS — Z5112 Encounter for antineoplastic immunotherapy: Secondary | ICD-10-CM | POA: Insufficient documentation

## 2024-07-07 DIAGNOSIS — E236 Other disorders of pituitary gland: Secondary | ICD-10-CM | POA: Insufficient documentation

## 2024-07-07 DIAGNOSIS — Z171 Estrogen receptor negative status [ER-]: Secondary | ICD-10-CM | POA: Insufficient documentation

## 2024-07-07 DIAGNOSIS — Z9012 Acquired absence of left breast and nipple: Secondary | ICD-10-CM | POA: Insufficient documentation

## 2024-07-07 DIAGNOSIS — Z1722 Progesterone receptor negative status: Secondary | ICD-10-CM | POA: Insufficient documentation

## 2024-07-07 DIAGNOSIS — G893 Neoplasm related pain (acute) (chronic): Secondary | ICD-10-CM | POA: Diagnosis not present

## 2024-07-07 DIAGNOSIS — C50412 Malignant neoplasm of upper-outer quadrant of left female breast: Secondary | ICD-10-CM | POA: Diagnosis not present

## 2024-07-07 DIAGNOSIS — Z79891 Long term (current) use of opiate analgesic: Secondary | ICD-10-CM | POA: Diagnosis not present

## 2024-07-07 DIAGNOSIS — Z95828 Presence of other vascular implants and grafts: Secondary | ICD-10-CM

## 2024-07-07 MED ORDER — OXYCODONE HCL 5 MG PO TABS
2.5000 mg | ORAL_TABLET | Freq: Four times a day (QID) | ORAL | 0 refills | Status: AC | PRN
  Filled 2024-07-07: qty 30, 15d supply, fill #0

## 2024-07-07 MED ORDER — ACETAMINOPHEN 325 MG PO TABS
650.0000 mg | ORAL_TABLET | Freq: Once | ORAL | Status: AC
  Administered 2024-07-07: 650 mg via ORAL
  Filled 2024-07-07: qty 2

## 2024-07-07 MED ORDER — SODIUM CHLORIDE 0.9 % IV SOLN: Freq: Once | INTRAVENOUS | Status: AC

## 2024-07-07 MED ORDER — ALTEPLASE 2 MG IJ SOLR
2.0000 mg | Freq: Once | INTRAMUSCULAR | Status: AC | PRN
  Administered 2024-07-07: 2 mg
  Filled 2024-07-07: qty 2

## 2024-07-07 MED ORDER — TRASTUZUMAB-DKST CHEMO 150 MG IV SOLR
6.0000 mg/kg | Freq: Once | INTRAVENOUS | Status: AC
  Administered 2024-07-07: 420 mg via INTRAVENOUS
  Filled 2024-07-07: qty 20

## 2024-07-07 MED ORDER — DIPHENHYDRAMINE HCL 25 MG PO CAPS
25.0000 mg | ORAL_CAPSULE | Freq: Once | ORAL | Status: AC
  Administered 2024-07-07: 25 mg via ORAL
  Filled 2024-07-07: qty 1

## 2024-07-07 NOTE — Progress Notes (Signed)
 Unable to get port to flush despite multiple nurses attempting and repositioning needle. Cathflo instilled @1305 .

## 2024-07-07 NOTE — Progress Notes (Signed)
 Patient Care Team: Loreli Kins, MD as PCP - General (Family Medicine) Georjean Darice HERO, MD as Consulting Physician (Neurology) Tyree Nanetta SAILOR, RN as Oncology Nurse Navigator Vernetta Berg, MD as Consulting Physician (General Surgery) Odean Potts, MD as Consulting Physician (Hematology and Oncology) Izell Domino, MD as Attending Physician (Radiation Oncology)  DIAGNOSIS:  Encounter Diagnosis  Name Primary?   Malignant neoplasm of upper-outer quadrant of left breast in female, estrogen receptor negative (HCC) Yes    SUMMARY OF ONCOLOGIC HISTORY: Oncology History  Malignant neoplasm of upper-outer quadrant of left breast in female, estrogen receptor negative (HCC)  02/10/2022 Initial Diagnosis   Palpable left breast masses and calcifications: 1.8 cm at 1:00 and 1.7 cm at 9:00, calcifications not biopsy.  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 Report date is 03/05/2022.  The CustomNext-Cancer+RNAinsight panel offered by [employer] includes sequencing and rearrangement analysis for the following 48 genes:  APC, ATM, AXIN2, BARD1, BMPR1A, BRCA1, BRCA2, BRIP1, CDH1, CDK4, CDKN2A, CHEK2, CTNNA1, DICER1, EGFR, EPCAM, GREM1, HOXB13, KIT, MEN1, MLH1, MSH2, MSH3, MSH6, MUTYH, NBN, NF1, NTHL1, PALB2, PDGFRA, PMS2, POLD1, POLE, PTEN, RAD50, RAD51C, RAD51D, SDHA, SDHB, SDHC, SDHD, SMAD4, SMARCA4, STK11, TP53, TSC1, TSC2, and VHL.  RNA data is routinely analyzed for use in variant interpretation for all genes.   04/07/2022 Surgery   Left mastectomy: 4.2 cm invasive pleomorphic lobular  carcinoma grade 2, LCIS, 5/5 lymph nodes positive with extranodal extension, margins negative ER 0%, PR 0%, HER2 3+, Ki-67 20%   04/27/2022 Cancer Staging   Staging form: Breast, AJCC 8th Edition - Pathologic: Stage IIIA (pT2, pN2, cM0, G2, ER-, PR-, HER2+) - Signed by Odean Potts, MD on 04/27/2022 Histologic grading system: 3 grade system   05/05/2022 - 05/26/2022 Chemotherapy   Patient is on Treatment Plan : BREAST DOCEtaxel  + Trastuzumab  + Pertuzumab  (THP) q21d x 8 cycles / Trastuzumab  + Pertuzumab  q21d x 4 cycles     05/26/2022 -  Chemotherapy   Patient is on Treatment Plan : BREAST MAINTENANCE Trastuzumab  IV (6) or SQ (600) D1 q21d X 11 Cycles     06/12/2023 Cancer Staging   Staging form: Breast, AJCC 8th Edition - Pathologic: Stage IV (cM1) - Signed by Crawford Morna Pickle, NP on 06/12/2023     CHIEF COMPLIANT: Follow-up of metastatic breast cancer  HISTORY OF PRESENT ILLNESS: Allison Pierce 78 year old with above-mentioned history of metastatic breast cancer who is currently on [name]  infusions.  She continues to have right shoulder pain for which she is seeing orthopedics.   Bone scan did not show any uptake in the MRI identified area.  It however identified a rib lesion as well as a lumbar spine lesion.  She is here today accompanied by her husband to discuss her scans and to plan the next steps.  She tells me that the pain is quite severe at times ranging from 5-10.  She is ran out of oxycodone  and she was prescribed muscle relaxant to alleviate her pain.      ALLERGIES:  is allergic to oxybutynin, prozac [fluoxetine hcl], rosuvastatin, and lyrica [pregabalin].  MEDICATIONS:  Current Outpatient Medications  Medication Sig Dispense Refill   bisacodyl  (DULCOLAX) 5 MG EC tablet Take 1 tablet (5 mg total) by mouth daily as needed for moderate constipation. 60 tablet 3   buPROPion  (WELLBUTRIN  XL) 300 MG 24 hr tablet Take 300 mg by mouth in the morning.     clonazePAM  (KLONOPIN ) 1  MG tablet Take 1 mg by mouth at bedtime.     diphenhydrAMINE  (BENADRYL ) 25 MG tablet Take 25 mg by mouth in the morning and at bedtime.     senna (SENOKOT) 8.6 MG TABS tablet Take 1 tablet (8.6 mg total) by mouth 3 (three) times daily. 120 tablet 3   tiZANidine  (ZANAFLEX ) 4 MG capsule Take 1 capsule (4 mg total) by mouth at bedtime. 30 capsule 1   traZODone  (DESYREL ) 150 MG tablet Take 225 mg by mouth at bedtime.     venlafaxine  XR (EFFEXOR -XR) 75 MG 24 hr capsule Take 75 mg by mouth daily with breakfast.     No current facility-administered medications for this visit.    PHYSICAL EXAMINATION: ECOG PERFORMANCE STATUS: 1 - Symptomatic but completely ambulatory  Vitals:   07/07/24 1127  BP: (!) 148/71  Pulse: 80  Resp: 16  Temp: 98 F (36.7 C)  SpO2: 98%   Filed Weights   07/07/24 1127  Weight: 156 lb 8 oz (71 kg)      LABORATORY DATA:  I have reviewed the data as listed    Latest Ref Rng & Units 04/22/2024    9:31 AM 03/31/2024    4:18 PM 01/28/2024    2:35 PM  CMP  Glucose 70 - 99 mg/dL 878  865    BUN 8 - 23 mg/dL 15  22    Creatinine 9.55 - 1.00 mg/dL 8.40  8.33  8.89   Sodium 135 - 145 mmol/L 134  133    Potassium 3.5 - 5.1 mmol/L 4.1  4.5    Chloride 98 - 111 mmol/L 101  101    CO2 22 - 32 mmol/L 26  27    Calcium 8.9 - 10.3 mg/dL 8.8  8.8    Total Protein 6.5 - 8.1 g/dL 6.4  6.2    Total Bilirubin 0.0 - 1.2 mg/dL 0.4  0.4    Alkaline Phos 38 - 126 U/L 81  81    AST 15 - 41 U/L 19  20    ALT 0 - 44 U/L 16  17      Lab Results  Component Value Date   WBC 5.7 04/22/2024   HGB 11.4 (L) 04/22/2024   HCT 34.5 (L) 04/22/2024   MCV 82.7 04/22/2024   PLT 293 04/22/2024   NEUTROABS 3.8 04/22/2024    ASSESSMENT & PLAN:  Malignant neoplasm of upper-outer quadrant of left breast in female, estrogen receptor negative (HCC) 04/07/2022:Left mastectomy: 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  T4 vertebral body   Treatment: Palliative chemotherapy with Taxotere  [name]  and Perjeta  every 3 weeks x1 cycle given 05/05/2022 (discontinued for severe toxicities and hospitalization)   ---------------------------------------------------------------------------------------------------------------------------------------------- Chemotoxicities: Hospitalization 05/11/2022-05/18/2022: Convulsive syncope, chemo induced neutropenia, oral mucositis, chronic CHF, severe diarrhea with dehydration ECHO 06/26/22: EF 55-60% (follows with Bensimhon) Hospitalization 11/18/2023-11/23/2023: COVID-19, hyponatremia and metabolic acidosis   Treatment eojw:1/74/76 [name]  maintenance.  (Added  Perjeta  06/21/22-discontinued 06/2023 secondary to colitis) Lumbar spine MRI 03/07/2024: New 2.5 cm L5 transverse process suspicious for metastases.  Versus degenerative change.  L3 Schmorl's node enlarged from 09/17/2023 which could be the cause of the back pain.   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 Buckley.  Brain MRI 09/20/2023: 2.6 x 7.5 mm mass of the pituitary: Stable   05/15/2024: MRI right shoulder: Indeterminate intramedullary lesion proximal humeral diaphysis 06/04/2024: Bone scan: No activity in the right humerus to correspond to the MRI findings.  New intense uptake right eighth rib and mid lumbar spine (indeterminate)  Patient is seeing orthopedics.  It is suspected that the shoulder pain is related to her prior neck surgery. The rib lesion is felt to be related to a rib fracture.  The lumbar spine lesion is still a concern but we will plan to obtain a PET CT scan in 3 months.  Plan: Continue with [name]  infusion.    No orders of the defined types  were placed in this encounter.  The patient has a good understanding of the overall plan. she agrees with it. she will call with any problems that may develop before the next visit here. Total time spent: 30 mins including face to face time and time spent for planning, charting and co-ordination of care   Naomi MARLA Chad, MD 07/07/24

## 2024-07-07 NOTE — Progress Notes (Signed)
 Verbal order from Dr. Gudena for Dye Study following lack of blood return from port and continued lack of blood return after cath flow portocol

## 2024-07-07 NOTE — Assessment & Plan Note (Signed)
 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  T4 vertebral body   Treatment: Palliative chemotherapy with Taxotere  [name]  and Perjeta  every 3 weeks x1 cycle given 05/05/2022 (discontinued for severe toxicities and hospitalization)   ---------------------------------------------------------------------------------------------------------------------------------------------- Chemotoxicities: Hospitalization 05/11/2022-05/18/2022: Convulsive syncope, chemo induced neutropenia, oral mucositis, chronic CHF, severe diarrhea with dehydration ECHO 06/26/22: EF 55-60% (follows with Bensimhon) Hospitalization 11/18/2023-11/23/2023: COVID-19, hyponatremia and metabolic acidosis   Treatment eojw:1/74/76 [name]  maintenance.  (Added Perjeta  06/21/22-discontinued 06/2023 secondary to colitis) Lumbar spine MRI 03/07/2024: New 2.5 cm L5 transverse process suspicious for metastases.  Versus degenerative change.  L3 Schmorl's node enlarged from 09/17/2023 which could be the cause of the back pain.   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 Buckley.  Brain MRI 09/20/2023: 2.6 x 7.5 mm mass of the pituitary: Stable   05/15/2024: MRI right shoulder: Indeterminate intramedullary lesion proximal humeral diaphysis 06/04/2024: Bone scan: No activity in the right humerus to correspond to the MRI findings.  New intense uptake right eighth rib and mid lumbar spine (indeterminate)  Will refer the patient to orthopedics for evaluation for injections regarding her shoulder and neck pain.

## 2024-07-07 NOTE — Patient Instructions (Signed)
 [facility] - A DEPT OF [facility]  Discharge Instructions: Thank you for choosing [facility] to provide your oncology and hematology care.   If you have a lab appointment with the Cancer Center, please go directly to the Cancer Center and check in at the registration area.   Wear comfortable clothing and clothing appropriate for easy access to any Portacath or PICC line.   We strive to give you quality time with your provider. You may need to reschedule your appointment if you arrive late (15 or more minutes).  Arriving late affects you and other patients whose appointments are after yours.  Also, if you miss three or more appointments without notifying the office, you may be dismissed from the clinic at the provider's discretion.      For prescription refill requests, have your pharmacy contact our office and allow 72 hours for refills to be completed.    Today you received the following chemotherapy and/or immunotherapy agents: Ogivri      To help prevent nausea and vomiting after your treatment, we encourage you to take your nausea medication as directed.  BELOW ARE SYMPTOMS THAT SHOULD BE REPORTED IMMEDIATELY: *FEVER GREATER THAN 100.4 F (38 C) OR HIGHER *CHILLS OR SWEATING *NAUSEA AND VOMITING THAT IS NOT CONTROLLED WITH YOUR NAUSEA MEDICATION *UNUSUAL SHORTNESS OF BREATH *UNUSUAL BRUISING OR BLEEDING *URINARY PROBLEMS (pain or burning when urinating, or frequent urination) *BOWEL PROBLEMS (unusual diarrhea, constipation, pain near the anus) TENDERNESS IN MOUTH AND THROAT WITH OR WITHOUT PRESENCE OF ULCERS (sore throat, sores in mouth, or a toothache) UNUSUAL RASH, SWELLING OR PAIN  UNUSUAL VAGINAL DISCHARGE OR ITCHING   Items with * indicate a potential emergency and should be followed up as soon as possible or go to the Emergency Department if any problems should occur.  Please show the CHEMOTHERAPY ALERT CARD or IMMUNOTHERAPY  ALERT CARD at check-in to the Emergency Department and triage nurse.  Should you have questions after your visit or need to cancel or reschedule your appointment, please contact [facility] - A DEPT OF [facility]  Dept: [phone]  and follow the prompts.  Office hours are 8:00 a.m. to 4:30 p.m. Monday - Friday. Please note that voicemails left after 4:00 p.m. may not be returned until the following business day.  We are closed weekends and major holidays. You have access to a nurse at all times for urgent questions. Please call the main number to the clinic Dept: [phone] and follow the prompts.   For any non-urgent questions, you may also contact your provider using MyChart. We now offer e-Visits for anyone 65 and older to request care online for non-urgent symptoms. For details visit [email or web address].   Also download the MyChart app! Go to the app store, search "MyChart", open the app, select [place], and log in with your MyChart username and password.

## 2024-07-10 NOTE — Therapy (Incomplete)
 OUTPATIENT PHYSICAL THERAPY CERVICAL EVALUATION   Patient Name: Allison Pierce MRN: [id number] DOB:Apr 20, 1946, 78 y.o., female Today's Date: 07/10/2024  END OF SESSION:   Past Medical History:  Diagnosis Date   Anxiety    C. difficile diarrhea    Depression    GERD (gastroesophageal reflux disease) OCCASIONALLY  TAKE TUMS   Human papilloma virus    Hyperlipidemia    Hypertension    left breast ca 12/2021   UC (ulcerative colitis) (HCC)    UTI (urinary tract infection)    Vulvar lesion    Past Surgical History:  Procedure Laterality Date   ABDOMINAL HYSTERECTOMY  1992   W/ SALPINGO-OOPHORECTOMY   ANTERIOR CERVICAL DECOMP/DISCECTOMY FUSION  01-18-2009  DR POOLE   C4  - C6   AUGMENTATION MAMMAPLASTY Bilateral    BREAST BIOPSY Right    BREAST BIOPSY Left 02/10/2022   x2   BREAST ENHANCEMENT SURGERY  2011   BREAST EXCISIONAL BIOPSY Left    BREAST RECONSTRUCTION WITH PLACEMENT OF TISSUE EXPANDER AND FLEX HD (ACELLULAR HYDRATED DERMIS) Left 04/07/2022   Procedure: BREAST RECONSTRUCTION WITH PLACEMENT OF TISSUE EXPANDER AND FLEX HD (ACELLULAR HYDRATED DERMIS);  Surgeon: Elisabeth Craig RAMAN, MD;  Location: [facility] OR;  Service: Plastics;  Laterality: Left;   BREAST SURGERY  02-04-2011  dr merrilyn   EXCISION LEFT BREAST MASS--  CALCIFICATION   EYE SURGERY Bilateral    cataract removal   MASTECTOMY W/ SENTINEL NODE BIOPSY Left 04/07/2022   Procedure: LEFT MASTECTOMY WITH SENTINEL NODE BIOPSY;  Surgeon: Vernetta Berg, MD;  Location: [facility] OR;  Service: General;  Laterality: Left;  LMA   PORTACATH PLACEMENT N/A 04/07/2022   Procedure: PORT-A-CATH INSERTION WITH ULTRASOUND GUIDANCE;  Surgeon: Vernetta Berg, MD;  Location: [facility] OR;  Service: General;  Laterality: N/A;   REMOVAL OF TISSUE EXPANDER AND PLACEMENT OF IMPLANT Left 03/16/2023   Procedure: Removal of left breast tissue expander and placement of permanent implant;  Surgeon: Waddell Leonce NOVAK, MD;  Location: [facility];  Service: Plastics;  Laterality: Left;   VULVAR LESION REMOVAL  09/10/2012   Procedure: VULVAR LESION;  Surgeon: Carlin LELON Forbes, MD;  Location: [facility];  Service: Gynecology;  Laterality: N/A;  WIDE EXCISION OF VULVAR LESION   Patient Active Problem List   Diagnosis Date Noted   Cellulitis of right upper extremity 11/26/2023   Anxiety and depression 11/25/2023   Sepsis due to cellulitis (HCC) 11/25/2023   AKI (acute kidney injury) 11/25/2023   Weakness 11/25/2023   Hyponatremia 11/21/2023   Hypokalemia 11/21/2023   Mild protein malnutrition 11/21/2023   Normocytic anemia 11/21/2023   COVID-19 virus infection 11/21/2023   Cardiotoxicity 11/20/2023   Pituitary mass 09/11/2023   Metastasis to bone (HCC) 06/12/2023   Colitis 05/13/2022   Alcohol abuse 05/12/2022   Irritable bowel syndrome 05/12/2022   Mild cognitive impairment 05/12/2022   Urinary incontinence 05/12/2022   Allergic rhinitis 05/12/2022   Recurrent major depression in remission 05/12/2022   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HCC) 05/12/2022   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   Acute colitis 05/12/2022   Lactic acidosis 05/12/2022   Chemotherapy induced neutropenia 05/12/2022   Chronic diastolic CHF (congestive heart failure) (HCC) 05/12/2022   Oral mucositis 05/12/2022   Noninfective gastroenteritis and colitis, unspecified 05/12/2022   Port-A-Cath in place 05/05/2022   Genetic testing 03/06/2022   Family history of breast cancer 02/23/2022   Family history of prostate cancer 02/23/2022   Essential hypertension 02/22/2022   Malignant  neoplasm of upper-outer quadrant of left breast in female, estrogen receptor negative (HCC) 02/20/2022   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  Rationale for Evaluation and Treatment: Rehabilitation  ONSET DATE: ***  SUBJECTIVE:                                                                                                                                                                                                         SUBJECTIVE STATEMENT: *** Hand dominance: {MISC; OT HAND DOMINANCE:[phone]}  PERTINENT HISTORY:  ***  PAIN:  Are you having pain? Yes: NPRS scale: *** Pain location: *** Pain description: *** Aggravating factors: *** Relieving factors: ***  PRECAUTIONS: {Therapy precautions:24002}  RED FLAGS: {PT Red Flags:29287}     WEIGHT BEARING RESTRICTIONS: No  FALLS:  Has patient fallen in last 6 months? {fallsyesno:27318}  LIVING ENVIRONMENT: Lives with: {OPRC lives with:25569::lives with their family} Lives in: {Lives in:25570} Stairs: {opstairs:27293} Has following equipment at home: {Assistive devices:23999}  OCCUPATION: ***  PLOF: {PLOF:24004}  PATIENT GOALS: ***  NEXT MD VISIT: ***  OBJECTIVE:  Note: Objective measures were completed at Evaluation unless otherwise noted.  DIAGNOSTIC FINDINGS:  ***  PATIENT SURVEYS:  PSFS: THE PATIENT SPECIFIC FUNCTIONAL SCALE  Place score of 0-10 (0 = unable to perform activity and 10 = able to perform activity at the same level as before injury or problem)  Activity Date: 07/11/2024         2.     3.     4.      Total Score ***      Total Score = Sum of activity scores/number of activities  Minimally Detectable Change: 3 points (for single activity); 2 points (for average score)  [facility] (nd). The Patient Specific Functional Scale . Retrieved from  [email or web address]   COGNITION: Overall cognitive status: {cognition:24006}  SENSATION: {sensation:27233}  POSTURE: {posture:25561}  PALPATION: ***   CERVICAL ROM:   {AROM/PROM:27142} ROM A/PROM (deg) Eval 07/11/2024  Flexion   Extension   Right lateral flexion   Left lateral flexion   Right rotation   Left rotation    (Blank rows = not tested)  UPPER EXTREMITY  ROM:  {AROM/PROM:27142} ROM Right Eval 07/11/2024 Left Eval 07/11/2024  Shoulder flexion    Shoulder extension    Shoulder abduction    Shoulder adduction    Shoulder extension    Shoulder internal rotation    Shoulder external rotation    Elbow flexion    Elbow extension    Wrist flexion    Wrist extension    Wrist ulnar deviation    Wrist radial deviation    Wrist pronation    Wrist supination     (Blank rows = not tested)  UPPER EXTREMITY MMT:  MMT Right Eval 07/11/2024 Left Eval 07/11/2024  Shoulder flexion    Shoulder extension    Shoulder abduction    Shoulder adduction    Shoulder extension    Shoulder internal rotation    Shoulder external rotation    Middle trapezius    Lower trapezius    Elbow flexion    Elbow extension    Wrist flexion    Wrist extension    Wrist ulnar deviation    Wrist radial deviation    Wrist pronation    Wrist supination    Grip strength     (Blank rows = not tested)  CERVICAL SPECIAL TESTS:  {Cervical special tests:25246}  FUNCTIONAL TESTS:  {Functional tests:24029}  TREATMENT DATE:  07/11/2024 TherEx: HEP handout provided with patient performing one set of each activity for appropriate form. Verbal and tactile cues provided.   Self-Care:  POC, ***                                                                                                                                 PATIENT EDUCATION:  Education details: HEP, POC, *** Person educated: {Person educated:25204} Education  method: {Education Method:25205} Education comprehension: {Education Comprehension:25206}  HOME EXERCISE PROGRAM: ***  ASSESSMENT:  CLINICAL IMPRESSION: Patient is a 78 y.o. F who was seen today for physical therapy evaluation and treatment for neck and Rt shoulder pain presenting with ***. Patient will benefit from skilled PT to address above noted deficits.  OBJECTIVE IMPAIRMENTS: {opptimpairments:25111}.   ACTIVITY LIMITATIONS: {activitylimitations:27494}  PARTICIPATION LIMITATIONS: {participationrestrictions:25113}  PERSONAL FACTORS: {Personal factors:25162} are also affecting patient's functional outcome.   REHAB POTENTIAL: {rehabpotential:25112}  CLINICAL DECISION MAKING: {clinical decision making:25114}  EVALUATION COMPLEXITY: {Evaluation complexity:25115}   GOALS: Goals reviewed with patient? Yes  SHORT TERM GOALS: Target date: ***  Patient will show compliance with initial HEP. Baseline:  Goal status: INITIAL  2.  Patient will report pain levels no greater than ***/10 in order to show improved overall quality of life. Baseline:  Goal status: INITIAL  3.  *** Baseline:  Goal status: INITIAL  4.  *** Baseline:  Goal status: INITIAL  5.  *** Baseline:  Goal status: INITIAL  6.  *** Baseline:  Goal status: INITIAL  LONG TERM GOALS: Target date: ***  Patient will be independent with final HEP in order to maintain and  progress upon functional gains made within PT. Baseline:  Goal status: INITIAL  2.   Patient will report pain levels no greater than ***/10 in order to show improved overall quality of life. Baseline:  Goal status: INITIAL  3.  Patient will increase PSFS to at least *** in order to show a significant improvement in subjective disability rating. Baseline:  Goal status: INITIAL  4.  Patient will increase *** ROM to at least *** in order to improve functional mobility. Baseline:  Goal status: INITIAL  5.  Patient will increase ***  ROM to at least *** in order to improve functional mobility. Baseline:  Goal status: INITIAL  6.  Patient will increase *** to at least *** in order to improve ***. Baseline:  Goal status: INITIAL   PLAN:  PT FREQUENCY: {rehab frequency:25116}  PT DURATION: {rehab duration:25117}  PLANNED INTERVENTIONS: 97164- PT Re-evaluation, 97750- Physical Performance Testing, 97110-Therapeutic exercises, 97530- Therapeutic activity, [id number]- Neuromuscular re-education, 97535- Self Care, 02859- Manual therapy, [id number]- Gait training, [phone]- Orthotic Initial, [phone]- Orthotic/Prosthetic subsequent, [phone]- Canalith repositioning, [phone]- Aquatic Therapy, [phone]- Electrical stimulation (unattended), [phone]- Electrical stimulation (manual), [id number]- Vasopneumatic device, [id number]- Ultrasound, [id number]- Traction (mechanical), [id number]- Ionotophoresis 4mg /ml Dexamethasone , 79439 (1-2 muscles), 20561 (3+ muscles)- Dry Needling, Patient/Family education, Balance training, Stair training, Taping, Joint mobilization, Joint manipulation, Spinal manipulation, Spinal mobilization, Scar mobilization, Vestibular training, DME instructions, Cryotherapy, and Moist heat  PLAN FOR NEXT SESSION: ***   Susannah Daring, PT, DPT 07/10/24 7:53 AM

## 2024-07-11 ENCOUNTER — Emergency Department (HOSPITAL_COMMUNITY)
Admission: EM | Admit: 2024-07-11 | Discharge: 2024-07-11 | Disposition: A | Discharge status: Home / Self Care | Source: Ambulatory Visit | Attending: Emergency Medicine | Admitting: Emergency Medicine

## 2024-07-11 ENCOUNTER — Other Ambulatory Visit: Payer: Self-pay

## 2024-07-11 ENCOUNTER — Other Ambulatory Visit (HOSPITAL_COMMUNITY): Payer: Self-pay

## 2024-07-11 ENCOUNTER — Ambulatory Visit

## 2024-07-11 ENCOUNTER — Telehealth: Payer: Self-pay | Admitting: *Deleted

## 2024-07-11 DIAGNOSIS — E871 Hypo-osmolality and hyponatremia: Secondary | ICD-10-CM | POA: Insufficient documentation

## 2024-07-11 DIAGNOSIS — R55 Syncope and collapse: Secondary | ICD-10-CM | POA: Diagnosis not present

## 2024-07-11 DIAGNOSIS — Z79899 Other long term (current) drug therapy: Secondary | ICD-10-CM | POA: Insufficient documentation

## 2024-07-11 DIAGNOSIS — D649 Anemia, unspecified: Secondary | ICD-10-CM | POA: Diagnosis not present

## 2024-07-11 DIAGNOSIS — Z853 Personal history of malignant neoplasm of breast: Secondary | ICD-10-CM | POA: Insufficient documentation

## 2024-07-11 DIAGNOSIS — I10 Essential (primary) hypertension: Secondary | ICD-10-CM | POA: Diagnosis not present

## 2024-07-11 LAB — CBC
HCT: 36.3 % (ref 36.0–46.0)
Hemoglobin: 11.2 g/dL — ABNORMAL LOW (ref 12.0–15.0)
MCH: 26.9 pg (ref 26.0–34.0)
MCHC: 30.9 g/dL (ref 30.0–36.0)
MCV: 87.1 fL (ref 80.0–100.0)
Platelets: 294 K/uL (ref 150–400)
RBC: 4.17 MIL/uL (ref 3.87–5.11)
RDW: 13.5 % (ref 11.5–15.5)
WBC: 6.4 K/uL (ref 4.0–10.5)
nRBC: 0 % (ref 0.0–0.2)

## 2024-07-11 LAB — COMPREHENSIVE METABOLIC PANEL WITH GFR
ALT: 17 U/L (ref 0–44)
AST: 21 U/L (ref 15–41)
Albumin: 4.2 g/dL (ref 3.5–5.0)
Alkaline Phosphatase: 87 U/L (ref 38–126)
Anion gap: 9 (ref 5–15)
BUN: 12 mg/dL (ref 8–23)
CO2: 26 mmol/L (ref 22–32)
Calcium: 9.7 mg/dL (ref 8.9–10.3)
Chloride: 98 mmol/L (ref 98–111)
Creatinine, Ser: 1.47 mg/dL — ABNORMAL HIGH (ref 0.44–1.00)
GFR, Estimated: 36 mL/min — ABNORMAL LOW (ref >60)
Glucose, Bld: 114 mg/dL — ABNORMAL HIGH (ref 70–99)
Potassium: 3.8 mmol/L (ref 3.5–5.1)
Sodium: 134 mmol/L — ABNORMAL LOW (ref 135–145)
Total Bilirubin: 0.3 mg/dL (ref 0.0–1.2)
Total Protein: 6.8 g/dL (ref 6.5–8.1)

## 2024-07-11 LAB — URINALYSIS, ROUTINE W REFLEX MICROSCOPIC
Bacteria, UA: NONE SEEN
Bilirubin Urine: NEGATIVE
Glucose, UA: NEGATIVE mg/dL
Hgb urine dipstick: NEGATIVE
Ketones, ur: NEGATIVE mg/dL
Leukocytes,Ua: NEGATIVE
Nitrite: NEGATIVE
Protein, ur: NEGATIVE mg/dL
Specific Gravity, Urine: 1.005 (ref 1.005–1.030)
pH: 5 (ref 5.0–8.0)

## 2024-07-11 LAB — LIPASE, BLOOD: Lipase: 31 U/L (ref 11–51)

## 2024-07-11 MED ORDER — LOSARTAN POTASSIUM 25 MG PO TABS
25.0000 mg | ORAL_TABLET | Freq: Every day | ORAL | 0 refills | Status: DC
  Filled 2024-07-11: qty 30, 30d supply, fill #0

## 2024-07-11 MED ORDER — LOSARTAN POTASSIUM 25 MG PO TABS
25.0000 mg | ORAL_TABLET | Freq: Once | ORAL | Status: AC
  Administered 2024-07-11: 25 mg via ORAL
  Filled 2024-07-11: qty 1

## 2024-07-11 MED ORDER — SODIUM CHLORIDE 0.9 % IV BOLUS
1000.0000 mL | Freq: Once | INTRAVENOUS | Status: AC
  Administered 2024-07-11: 1000 mL via INTRAVENOUS

## 2024-07-11 NOTE — ED Triage Notes (Signed)
 Went for chemo on Monday but port wouldn't work, so chemo was given through PIV. Tuesday had LOC while on the commode having a BM. Laid down to the ground by family member and patient woke up to start vomiting. Almost passed out again today but no LOC. Encouraged to come here due to oncologist being out of town. Has left breast cancer.

## 2024-07-11 NOTE — Discharge Instructions (Addendum)
 Today you were seen for a syncopal episode and hypertension.  I suspect your syncopal episode was likely vasovagal in nature.  You have been started on losartan  and should take this medication as prescribed.  Please follow-up with your PCP or cardiology in the upcoming week for further evaluation and workup.  Please return to the ED if you have double vision, uncontrollable vomiting, or worsening symptoms.  Thank you for letting us  treat you today. After reviewing your labs and imaging, I feel you are safe to go home. Please follow up with your PCP in the next several days and provide them with your records from this visit. Return to the Emergency Room if pain becomes severe or symptoms worsen.

## 2024-07-11 NOTE — ED Notes (Signed)
 Paramedic April aware of high BP

## 2024-07-11 NOTE — ED Provider Notes (Signed)
 [facility] Provider Note   CSN: [id number] Arrival date & time: 07/11/24  1220     Patient presents with: Loss of Consciousness   Allison Pierce is a 78 y.o. female.  Past medical history significant for breast cancer presents today for syncope/presyncope.  Patient had chemo on Monday in which her port was unable to be accessed, therefore it was placed through a peripheral IV.  Patient had a syncopal episode on Tuesday while on the commode, her husband was able to prevent her from falling to the ground and laid her gently on the ground.  Patient when coming to began to vomit.  Patient has had some nausea and vomiting.  Today patient had a presyncopal episode in which she felt nauseous as well but did not have any LOC or vomiting.  Patient's oncologist recommended she come here for evaluation as they are out of town. Patient denies HA, numbness, diplopia, weakness, tinnitus, fever, chills, hematemesis, CP, or SHOB.     Loss of Consciousness Associated symptoms: nausea and vomiting        Prior to Admission medications   Medication Sig Start Date End Date Taking? Authorizing Provider  losartan  (COZAAR ) 25 MG tablet Take 1 tablet (25 mg total) by mouth daily. 07/11/24  Yes Francis Ileana SAILOR, PA-C  bisacodyl  (DULCOLAX) 5 MG EC tablet Take 1 tablet (5 mg total) by mouth daily as needed for moderate constipation. 02/04/24   Gudena, Vinay, MD  buPROPion  (WELLBUTRIN  XL) 300 MG 24 hr tablet Take 300 mg by mouth in the morning.    [provider]  clonazePAM  (KLONOPIN ) 1 MG tablet Take 1 mg by mouth at bedtime.    [provider]  diphenhydrAMINE  (BENADRYL ) 25 MG tablet Take 25 mg by mouth in the morning and at bedtime.    [provider]  oxyCODONE  (OXY IR/ROXICODONE ) 5 MG immediate release tablet Take 0.5 tablets (2.5 mg total) by mouth every 6 (six) hours as needed for severe pain (pain score 7-10). 07/07/24   Gudena,  Vinay, MD  senna (SENOKOT) 8.6 MG TABS tablet Take 1 tablet (8.6 mg total) by mouth 3 (three) times daily. 04/28/24   Gudena, Vinay, MD  tiZANidine  (ZANAFLEX ) 4 MG capsule Take 1 capsule (4 mg total) by mouth at bedtime. 06/26/24   Gretta Bertrum ORN, PA-C  traZODone  (DESYREL ) 150 MG tablet Take 225 mg by mouth at bedtime.    [provider]  venlafaxine  XR (EFFEXOR -XR) 75 MG 24 hr capsule Take 75 mg by mouth daily with breakfast.    [provider]  prochlorperazine  (COMPAZINE ) 10 MG tablet Take 1 tablet (10 mg total) by mouth every 6 (six) hours as needed (Nausea or vomiting). Patient not taking: Reported on 05/12/2022 04/27/22 06/07/22  Gudena, Vinay, MD    Allergies: Oxybutynin, Prozac [fluoxetine hcl], Rosuvastatin, and Lyrica [pregabalin]    Review of Systems  Cardiovascular:  Positive for syncope.  Gastrointestinal:  Positive for nausea and vomiting.  Neurological:  Positive for syncope.    Updated Vital Signs BP (!) 191/95   Pulse 65   Temp 98.2 F (36.8 C) (Oral)   Resp 19   SpO2 99%   Physical Exam Vitals and nursing note reviewed.  Constitutional:      General: She is not in acute distress.    Appearance: Normal appearance. She is well-developed. She is not toxic-appearing.  HENT:     Head: Normocephalic and atraumatic.     Right  Ear: External ear normal.     Left Ear: External ear normal.     Mouth/Throat:     Mouth: Mucous membranes are moist.  Eyes:     Extraocular Movements: Extraocular movements intact.     Conjunctiva/sclera: Conjunctivae normal.     Pupils: Pupils are equal, round, and reactive to light.  Cardiovascular:     Rate and Rhythm: Normal rate and regular rhythm.     Pulses: Normal pulses.     Heart sounds: Normal heart sounds. No murmur heard. Pulmonary:     Effort: Pulmonary effort is normal. No respiratory distress.     Breath sounds: Normal breath sounds.  Abdominal:     Palpations: Abdomen is soft.     Tenderness: There is no  abdominal tenderness.  Musculoskeletal:        General: No swelling.     Cervical back: Neck supple.  Skin:    General: Skin is warm and dry.     Capillary Refill: Capillary refill takes less than 2 seconds.  Neurological:     General: No focal deficit present.     Mental Status: She is alert and oriented to person, place, and time.     GCS: GCS eye subscore is 4. GCS verbal subscore is 5. GCS motor subscore is 6.     Cranial Nerves: Cranial nerves 2-12 are intact.  Psychiatric:        Mood and Affect: Mood normal.     (all labs ordered are listed, but only abnormal results are displayed) Labs Reviewed  COMPREHENSIVE METABOLIC PANEL WITH GFR - Abnormal; Notable for the following components:      Result Value   Sodium 134 (*)    Glucose, Bld 114 (*)    Creatinine, Ser 1.47 (*)    GFR, Estimated 36 (*)    All other components within normal limits  CBC - Abnormal; Notable for the following components:   Hemoglobin 11.2 (*)    All other components within normal limits  LIPASE, BLOOD  URINALYSIS, ROUTINE W REFLEX MICROSCOPIC    EKG: EKG Interpretation Date/Time:  Friday July 11 2024 13:48:11 EDT Ventricular Rate:  64 PR Interval:  218 QRS Duration:  92 QT Interval:  410 QTC Calculation: 423 R Axis:   27  Text Interpretation: Sinus rhythm Borderline prolonged PR interval Confirmed by Lenor Hollering [phone]) on 07/11/2024 2:39:31 PM  Radiology: No results found.   Procedures   Medications Ordered in the ED  losartan  (COZAAR ) tablet 25 mg (has no administration in time range)  sodium chloride  0.9 % bolus 1,000 mL (1,000 mLs Intravenous New Bag/Given 07/11/24 1359)                                    Medical Decision Making Amount and/or Complexity of Data Reviewed Labs: ordered.   This patient presents to the ED for concern of syncope/near syncope, this involves an extensive number of treatment options, and is a complaint that carries with it a high risk of  complications and morbidity.  The differential diagnosis includes electrolyte abnormality, orthostatic hypotension, vasovagal syncope, viral GI illness, UTI, mild dehydration, HTN   Co morbidities / Chronic conditions that complicate the patient evaluation  Breast cancer  Lab Tests:  I Ordered, and personally interpreted labs.  The pertinent results include: Mild hyponatremia 134, elevated creatinine at 1.47 which is around patient's baseline, mild anemia at 11.2 which  is chronic per historical values, lipase 31, UA unremarkable   Cardiac Monitoring: / EKG:  The patient was maintained on a cardiac monitor.  I personally viewed and interpreted the cardiac monitored which showed an underlying rhythm of: NSR   Problem List / ED Course / Critical interventions / Medication management  I ordered medication including IVF and losartan  I have reviewed the patients home medicines and have made adjustments as needed Upon reassessment patient states that her symptoms have improved, but she is still having some dizziness with position change.  Patient now reports that she often has to strain to urinate and is bearing down while urinating. Patient able to ambulate with normal gait and no assistance.  Test / Admission - Considered:  Considered for admission or further workup however patient's vital signs, physical exam, and labs have been reassuring.  Patient's symptoms likely due to vasovagal syncope and hypertension.  Patient started on losartan  and advised to follow-up with primary care or cardiology.  Patient given return precautions.  I feel patient is safe for discharge at this time.     Final diagnoses:  Syncope, unspecified syncope type  Hypertension, unspecified type    ED Discharge Orders          Ordered    losartan  (COZAAR ) 25 MG tablet  Daily        07/11/24 1634               Francis Ileana SAILOR, PA-C 07/11/24 1723    Lenor Hollering, MD 07/12/24 [phone]

## 2024-07-11 NOTE — Telephone Encounter (Signed)
 Received call from pt with complaint of dizziness, nausea and syncope x1.  MD out of office, RN reviewed with covering provider who has recommended pt go to ED for further evaluation and tx.  Pt educated and verbalized understanding.

## 2024-07-11 NOTE — ED Notes (Signed)
Pt ambulated w/o any problems

## 2024-07-11 NOTE — ED Notes (Signed)
 Pt ambulated in room without assistance. Still complains of slight dizziness

## 2024-07-13 DIAGNOSIS — R55 Syncope and collapse: Secondary | ICD-10-CM | POA: Diagnosis not present

## 2024-07-16 ENCOUNTER — Telehealth (HOSPITAL_COMMUNITY): Payer: Self-pay | Admitting: Internal Medicine

## 2024-07-16 NOTE — Telephone Encounter (Signed)
 Called to confirm/remind patient of their appointment at the [facility] on 07/16/24.   Appointment:   [] Confirmed  [x] Left mess   [] No answer/No voice mail  [] VM Full/unable to leave message  [] Phone not in service  Patient reminded to bring all medications and/or complete list.  Confirmed patient has transportation. Gave directions, instructed to utilize valet parking.

## 2024-07-17 ENCOUNTER — Ambulatory Visit (HOSPITAL_BASED_OUTPATIENT_CLINIC_OR_DEPARTMENT_OTHER)
Admission: RE | Admit: 2024-07-17 | Discharge: 2024-07-17 | Disposition: A | Discharge status: Home / Self Care | Source: Ambulatory Visit | Attending: Internal Medicine | Admitting: Internal Medicine

## 2024-07-17 ENCOUNTER — Encounter (HOSPITAL_COMMUNITY): Payer: Self-pay | Admitting: Internal Medicine

## 2024-07-17 ENCOUNTER — Ambulatory Visit (HOSPITAL_COMMUNITY)
Admission: RE | Admit: 2024-07-17 | Discharge: 2024-07-17 | Disposition: A | Discharge status: Home / Self Care | Source: Ambulatory Visit | Attending: Internal Medicine | Admitting: Internal Medicine

## 2024-07-17 VITALS — BP 168/92 | HR 71 | Ht 66.0 in | Wt 155.0 lb

## 2024-07-17 DIAGNOSIS — C50412 Malignant neoplasm of upper-outer quadrant of left female breast: Secondary | ICD-10-CM

## 2024-07-17 DIAGNOSIS — I10 Essential (primary) hypertension: Secondary | ICD-10-CM

## 2024-07-17 DIAGNOSIS — I427 Cardiomyopathy due to drug and external agent: Secondary | ICD-10-CM

## 2024-07-17 DIAGNOSIS — C50212 Malignant neoplasm of upper-inner quadrant of left female breast: Secondary | ICD-10-CM | POA: Insufficient documentation

## 2024-07-17 DIAGNOSIS — I5032 Chronic diastolic (congestive) heart failure: Secondary | ICD-10-CM | POA: Insufficient documentation

## 2024-07-17 DIAGNOSIS — T451X5A Adverse effect of antineoplastic and immunosuppressive drugs, initial encounter: Secondary | ICD-10-CM

## 2024-07-17 DIAGNOSIS — Z171 Estrogen receptor negative status [ER-]: Secondary | ICD-10-CM

## 2024-07-17 LAB — ECHOCARDIOGRAM COMPLETE
Area-P 1/2: 2.91 cm2
Calc EF: 56.1 %
MV VTI: 1.49 cm2
S' Lateral: 2.6 cm
Single Plane A2C EF: 54.9 %
Single Plane A4C EF: 58.5 %

## 2024-07-17 MED ORDER — LOSARTAN POTASSIUM 50 MG PO TABS: 50.0000 mg | ORAL_TABLET | Freq: Every day | ORAL | 3 refills | Status: AC

## 2024-07-17 NOTE — Patient Instructions (Signed)
 Medication Changes:  INCREASE LOSARTAN  TO 50MG  ONCE DAILY   Follow-Up in: 6 MONTHS WITH AN ECHO PLEASE CALL OUR OFFICE AROUND [place] TO GET SCHEDULED FOR YOUR APPOINTMENT. PHONE NUMBER IS [phone] OPTION 2   At the [facility], you and your health needs are our priority. We have a designated team specialized in the treatment of Heart Failure. This Care Team includes your primary Heart Failure Specialized Cardiologist (physician), Advanced Practice Providers (APPs- Physician Assistants and Nurse Practitioners), and Pharmacist who all work together to provide you with the care you need, when you need it.   You may see any of the following providers on your designated Care Team at your next follow up:  Dr. Toribio Fuel Dr. Ezra Shuck Dr. Ria Commander Dr. Odis Brownie Greig Mosses, NP Caffie Shed, [place] [facility], [place] Beckey Coe, NP [place] Lee, NP Tinnie Redman, PharmD   Please be sure to bring in all your medications bottles to every appointment.   Need to Contact Us :  If you have any questions or concerns before your next appointment please send us  a message through [place] or call our office at [phone].    TO LEAVE A MESSAGE FOR THE NURSE SELECT OPTION 2, PLEASE LEAVE A MESSAGE INCLUDING: YOUR NAME DATE OF BIRTH CALL BACK NUMBER REASON FOR CALL**this is important as we prioritize the call backs  YOU WILL RECEIVE A CALL BACK THE SAME DAY AS LONG AS YOU CALL BEFORE 4:00 PM

## 2024-07-17 NOTE — Progress Notes (Signed)
  Echocardiogram 2D Echocardiogram has been performed.  Norleen ORN [facility] 07/17/2024, 2:47 PM

## 2024-07-17 NOTE — Progress Notes (Signed)
 CARDIO-ONCOLOGY CLINIC NOTE  Referring Physician: Dr. Odean Primary Care: Loreli Kins, MD Primary Cardiologist: None   HPI:  Allison Pierce is a 78 y.o. female with HTN, HL, anxiety and left breast cancer referred by Dr. Odean for further evaluation of reduced EF on echocardiogram.   No previous cardiac history. Was diagnosed with left breast CA in 5/23. Stage 1A (ER/PR -, HER2+). Underwent left mastectomy 7/23.   Chemo: 8/23: Docetaxel  + Trastuzumab  + Pertuzumab  (THP) q21d x 8 cycles / Trastuzumab  + Pertuzumab  q21d x 4 cycles  Maintenance: Trastuzumab  IV (6) or SQ (600) D1 q21d X 11 Cycles   Found to have osseous mets in 7/23 -> stage IV CA  Here for routine f/u. Remains on [name]  q 3 weeks. Feels good. No SOB, edema or PND.    Echo today 07/18/24: EF 60-65% Personally reviewed  Echos: 03/01/22: EF 60-65% G2DD GLs -18.6% 06/26/22:  EF 55-60% GLS -14.1% 09/21/22 EF 60-65%  Echo 05/29/23 EF 60-65%  Echo 09/17/23 EF 60-65%  Echo 12/04/23 EF 60-65%     Past Medical History:  Diagnosis Date   Anxiety    C. difficile diarrhea    Depression    GERD (gastroesophageal reflux disease) OCCASIONALLY  TAKE TUMS   Human papilloma virus    Hyperlipidemia    Hypertension    left breast ca 12/2021   UC (ulcerative colitis) (HCC)    UTI (urinary tract infection)    Vulvar lesion     Current Outpatient Medications  Medication Sig Dispense Refill   buPROPion  (WELLBUTRIN  XL) 300 MG 24 hr tablet Take 300 mg by mouth in the morning.     clonazePAM  (KLONOPIN ) 1 MG tablet Take 1 mg by mouth at bedtime.     diphenhydrAMINE  (BENADRYL ) 25 MG tablet Take 25 mg by mouth in the morning and at bedtime.     losartan  (COZAAR ) 25 MG tablet Take 1 tablet (25 mg total) by mouth daily. 30 tablet 0   oxyCODONE  (OXY IR/ROXICODONE ) 5 MG immediate release tablet Take 0.5 tablets (2.5 mg total) by mouth every 6 (six) hours as needed for severe pain (pain score 7-10). 30 tablet 0   senna (SENOKOT) 8.6  MG TABS tablet Take 1 tablet (8.6 mg total) by mouth 3 (three) times daily. 120 tablet 3   tiZANidine  (ZANAFLEX ) 4 MG capsule Take 1 capsule (4 mg total) by mouth at bedtime. 30 capsule 1   traZODone  (DESYREL ) 150 MG tablet Take 225 mg by mouth at bedtime.     venlafaxine  XR (EFFEXOR -XR) 75 MG 24 hr capsule Take 75 mg by mouth daily with breakfast.     No current facility-administered medications for this encounter.    Allergies  Allergen Reactions   Oxybutynin Other (See Comments)    Unknown per Pt    Prozac [Fluoxetine Hcl] Other (See Comments)    Could not lift head up or get out of bed    Rosuvastatin Other (See Comments)    Unknown per Pt    Lyrica [Pregabalin] Anxiety and Other (See Comments)    Felt weird      Social History   Socioeconomic History   Marital status: Married    Spouse name: Lynwood   Number of children: 1   Years of education: 12   Highest education level: Not on file  Occupational History   Occupation: retired  Tobacco Use   Smoking status: Never   Smokeless tobacco: Never  Vaping Use   Vaping status: Never Used  Substance and Sexual Activity   Alcohol use: Yes    Alcohol/week: 7.0 standard drinks of alcohol    Types: 7 Cans of beer per week   Drug use: Never   Sexual activity: Not Currently  Other Topics Concern   Not on file  Social History Narrative   Graduated HS      Right handed      Lives with husband   Social Drivers of Health   Financial Resource Strain: Low Risk  (02/22/2022)   Overall Financial Resource Strain (CARDIA)    Difficulty of Paying Living Expenses: Not hard at all  Food Insecurity: No Food Insecurity (11/25/2023)   Hunger Vital Sign    Worried About Running Out of Food in the Last Year: Never true    Ran Out of Food in the Last Year: Never true  Transportation Needs: No Transportation Needs (11/25/2023)   PRAPARE - Administrator, Civil Service (Medical): No    Lack of Transportation (Non-Medical):  No  Physical Activity: Not on file  Stress: Not on file  Social Connections: Patient Declined (11/25/2023)   Social Connection and Isolation Panel    Frequency of Communication with Friends and Family: Patient declined    Frequency of Social Gatherings with Friends and Family: Patient declined    Attends Religious Services: Patient declined    Database administrator or Organizations: Patient declined    Attends Banker Meetings: Patient declined    Marital Status: Patient declined  Intimate Partner Violence: Not At Risk (11/25/2023)   Humiliation, Afraid, Rape, and Kick questionnaire    Fear of Current or Ex-Partner: No    Emotionally Abused: No    Physically Abused: No    Sexually Abused: No      Family History  Problem Relation Age of Onset   Lung cancer Mother 67       she smoked   Prostate cancer Brother 53   Breast cancer Cousin        paternal first cousin   Breast cancer Cousin        paternal first cousin   Liver disease Neg Hx    Esophageal cancer Neg Hx    Colon cancer Neg Hx     Vitals:   07/17/24 1446  BP: (!) 168/92  Pulse: 71  SpO2: 97%  Weight: 70.3 kg (155 lb)  Height: 5' 6 (1.676 m)     PHYSICAL EXAM: General:  Well appearing. No resp difficulty HEENT: normal Neck: supple. no JVD. Carotids 2+ bilat; no bruits. No lymphadenopathy or thryomegaly appreciated. Cor: PMI nondisplaced. Regular rate & rhythm. No rubs, gallops or murmurs. Lungs: clear Abdomen: soft, nontender, nondistended. No hepatosplenomegaly. No bruits or masses. Good bowel sounds. Extremities: no cyanosis, clubbing, rash, edema Neuro: alert & orientedx3, cranial nerves grossly intact. moves all 4 extremities w/o difficulty. Affect pleasant   ASSESSMENT & PLAN:  1. Abnormal echocardiogram, potential herceptin  cardiotoxicity - Echo 03/01/22: EF 60-65% G2DD GLs -18.6% - Echo 06/26/22: EF 55-60% GLS -14.1% - I have reviewed both her pre-chemo echocardiogram and her most  recent echo side-by-side. Although EF on the most recent one may be slightly less vigorous than her pre-chemo echo it is still well within the normal range. Strain imaging is not ideal due to poor endocardial tracking on most recent echo but also suggests a possible slight decrease in contractile force - Although changes are minor it does fit the time course for potential herceptin  cardiotoxicity.  -  Echo 09/21/22 EF 60-65% (completely normal)  - Echo 09/21/22 EF 60-65%  - Echo 02/12/23 EF 60-65% - Echo  05/29/23 EF 60-65%  - Echo 12/04/23 EF 60-65% - Echo today 07/18/24: EF 60-65% Personally reviewed ->  Continue [name]    - Continue losartan  and Toprol . Off spiro with low BP - EF has been stable. Will switch to q6 month echos while on Hercpetin  2. Left Breast Cancer, stage IV with bony mets - diagnosed 5/23. Stage 1A (ER/PR -, HER2+).  - s/p left mastectomy 7/23.  - Found to have osseous mets in 7/23 -> stage IV CA - s/p 8/23: Docetaxel  + Trastuzumab  + Pertuzumab  (THP) q21d x 8 cycles / Trastuzumab  + Pertuzumab  q21d x 4 cycles  - s/pTrastuzumab IV (6) or SQ (600) D1 q21d X 11 Cycles  - Echo today 07/18/24: EF 60-65% Continue [name]    3. Hypertension - Off spiro due to low BP - Increase losartan  25mg  -> 50mg  - F/u PCP  Toribio Fuel, MD  2:57 PM

## 2024-07-18 ENCOUNTER — Encounter: Payer: Self-pay | Admitting: Hematology and Oncology

## 2024-07-18 ENCOUNTER — Other Ambulatory Visit (HOSPITAL_COMMUNITY): Payer: Self-pay | Admitting: Hematology and Oncology

## 2024-07-18 ENCOUNTER — Ambulatory Visit (HOSPITAL_COMMUNITY)
Admission: RE | Admit: 2024-07-18 | Discharge: 2024-07-18 | Disposition: A | Discharge status: Home / Self Care | Source: Ambulatory Visit | Attending: Hematology and Oncology | Admitting: Hematology and Oncology

## 2024-07-18 DIAGNOSIS — Z452 Encounter for adjustment and management of vascular access device: Secondary | ICD-10-CM | POA: Diagnosis not present

## 2024-07-18 DIAGNOSIS — C50412 Malignant neoplasm of upper-outer quadrant of left female breast: Secondary | ICD-10-CM

## 2024-07-18 DIAGNOSIS — Z95828 Presence of other vascular implants and grafts: Secondary | ICD-10-CM

## 2024-07-18 DIAGNOSIS — T82868A Thrombosis of vascular prosthetic devices, implants and grafts, initial encounter: Secondary | ICD-10-CM | POA: Diagnosis not present

## 2024-07-18 DIAGNOSIS — Z171 Estrogen receptor negative status [ER-]: Secondary | ICD-10-CM | POA: Diagnosis not present

## 2024-07-18 DIAGNOSIS — C7951 Secondary malignant neoplasm of bone: Secondary | ICD-10-CM

## 2024-07-18 HISTORY — PX: IR CV LINE INJECTION: IMG2294

## 2024-07-18 MED ADMIN — Iohexol Inj 300 MG/ML: 10 mL | INTRAVENOUS | NDC 00407141361

## 2024-07-23 ENCOUNTER — Ambulatory Visit: Admitting: Physical Therapy

## 2024-07-23 ENCOUNTER — Ambulatory Visit: Admitting: Physician Assistant

## 2024-07-23 ENCOUNTER — Encounter: Payer: Self-pay | Admitting: Physical Therapy

## 2024-07-23 DIAGNOSIS — M6281 Muscle weakness (generalized): Secondary | ICD-10-CM | POA: Diagnosis not present

## 2024-07-23 DIAGNOSIS — M542 Cervicalgia: Secondary | ICD-10-CM | POA: Diagnosis not present

## 2024-07-23 DIAGNOSIS — M25511 Pain in right shoulder: Secondary | ICD-10-CM

## 2024-07-23 DIAGNOSIS — R293 Abnormal posture: Secondary | ICD-10-CM

## 2024-07-23 NOTE — Therapy (Signed)
 OUTPATIENT PHYSICAL THERAPY CERVICAL EVALUATION   Patient Name: Allison Pierce MRN: [id number] DOB:07-03-1946, 78 y.o., female Today's Date: 07/23/2024  END OF SESSION:  PT End of Session - 07/23/24 1119     Visit Number 1    Number of Visits 6    Date for Recertification  09/03/24    Authorization Type Medicare    Progress Note Due on Visit 10    PT Start Time 1016    PT Stop Time 1050    PT Time Calculation (min) 34 min    Activity Tolerance Patient tolerated treatment well    Behavior During Therapy WFL for tasks assessed/performed          Past Medical History:  Diagnosis Date   Anxiety    C. difficile diarrhea    Depression    GERD (gastroesophageal reflux disease) OCCASIONALLY  TAKE TUMS   Human papilloma virus    Hyperlipidemia    Hypertension    left breast ca 12/2021   UC (ulcerative colitis) (HCC)    UTI (urinary tract infection)    Vulvar lesion    Past Surgical History:  Procedure Laterality Date   ABDOMINAL HYSTERECTOMY  1992   W/ SALPINGO-OOPHORECTOMY   ANTERIOR CERVICAL DECOMP/DISCECTOMY FUSION  01-18-2009  DR POOLE   C4  - C6   AUGMENTATION MAMMAPLASTY Bilateral    BREAST BIOPSY Right    BREAST BIOPSY Left 02/10/2022   x2   BREAST ENHANCEMENT SURGERY  2011   BREAST EXCISIONAL BIOPSY Left    BREAST RECONSTRUCTION WITH PLACEMENT OF TISSUE EXPANDER AND FLEX HD (ACELLULAR HYDRATED DERMIS) Left 04/07/2022   Procedure: BREAST RECONSTRUCTION WITH PLACEMENT OF TISSUE EXPANDER AND FLEX HD (ACELLULAR HYDRATED DERMIS);  Surgeon: Elisabeth Craig RAMAN, MD;  Location: [facility] OR;  Service: Plastics;  Laterality: Left;   BREAST SURGERY  02-04-2011  dr merrilyn   EXCISION LEFT BREAST MASS--  CALCIFICATION   EYE SURGERY Bilateral    cataract removal   IR CV LINE INJECTION  07/18/2024   MASTECTOMY W/ SENTINEL NODE BIOPSY Left 04/07/2022   Procedure: LEFT MASTECTOMY WITH SENTINEL NODE BIOPSY;  Surgeon: Vernetta Berg, MD;  Location: [facility] OR;  Service: General;   Laterality: Left;  LMA   PORTACATH PLACEMENT N/A 04/07/2022   Procedure: PORT-A-CATH INSERTION WITH ULTRASOUND GUIDANCE;  Surgeon: Vernetta Berg, MD;  Location: [facility] OR;  Service: General;  Laterality: N/A;   REMOVAL OF TISSUE EXPANDER AND PLACEMENT OF IMPLANT Left 03/16/2023   Procedure: Removal of left breast tissue expander and placement of permanent implant;  Surgeon: Waddell Leonce NOVAK, MD;  Location: [facility];  Service: Plastics;  Laterality: Left;   VULVAR LESION REMOVAL  09/10/2012   Procedure: VULVAR LESION;  Surgeon: Carlin LELON Forbes, MD;  Location: [facility];  Service: Gynecology;  Laterality: N/A;  WIDE EXCISION OF VULVAR LESION   Patient Active Problem List   Diagnosis Date Noted   Cellulitis of right upper extremity 11/26/2023   Anxiety and depression 11/25/2023   Sepsis due to cellulitis (HCC) 11/25/2023   AKI (acute kidney injury) 11/25/2023   Weakness 11/25/2023   Hyponatremia 11/21/2023   Hypokalemia 11/21/2023   Mild protein malnutrition 11/21/2023   Normocytic anemia 11/21/2023   COVID-19 virus infection 11/21/2023   Cardiotoxicity 11/20/2023   Pituitary mass 09/11/2023   Metastasis to bone (HCC) 06/12/2023   Colitis 05/13/2022   Alcohol abuse 05/12/2022   Irritable bowel syndrome 05/12/2022   Mild cognitive impairment 05/12/2022   Urinary incontinence  05/12/2022   Allergic rhinitis 05/12/2022   Recurrent major depression in remission 05/12/2022   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HCC) 05/12/2022   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   Acute colitis 05/12/2022   Lactic acidosis 05/12/2022   Chemotherapy induced neutropenia 05/12/2022   Chronic diastolic CHF (congestive heart failure) (HCC) 05/12/2022   Oral mucositis 05/12/2022   Noninfective gastroenteritis and colitis, unspecified 05/12/2022   Port-A-Cath in place 05/05/2022   Genetic testing 03/06/2022   Family history of breast cancer 02/23/2022   Family history of prostate  cancer 02/23/2022   Essential hypertension 02/22/2022   Malignant neoplasm of upper-outer quadrant of left breast in female, estrogen receptor negative (HCC) 02/20/2022   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 02/20/2022   Anxiety state 01/24/2013   Arthropathy 01/24/2013   Depressive disorder, not elsewhere classified 01/24/2013   Mixed hyperlipidemia 01/24/2013    PCP: Joen Gentry, MD   REFERRING PROVIDER: Bertrum Gaskins, PA-C  REFERRING DIAG: M54.2 (ICD-10-CM) - Neck pain M25.511 (ICD-10-CM) - Acute pain of right shoulder  THERAPY DIAG:  Cervicalgia - Plan: PT plan of care cert/re-cert  Muscle weakness (generalized) - Plan: PT plan of care cert/re-cert  Acute pain of right shoulder - Plan: PT plan of care cert/re-cert  Abnormal posture - Plan: PT plan of care cert/re-cert  Rationale for Evaluation and Treatment: Rehabilitation  ONSET DATE: 4-5 months ago   SUBJECTIVE:                                                                                                                                                                                                         SUBJECTIVE STATEMENT: Pt reports she fell about 4-5 months ago landing on her Rt shoulder causing pain.  She also fell about a week ago falling on the Rt shoulder as well.  She reports pain continues to get worse and has not improved.  Hand dominance: Right  PERTINENT HISTORY:  anxiety, hx c.diff, depression, HTN, Lt Breast Cancer (active metastatic), UC, HTN, C4-6 ACDF  PAIN:  Are you having pain? Yes: NPRS scale: currently 7, up to 9, at best 3/10 Pain location: Rt shoulder and neck Pain description: it's just pain aching all the time Aggravating factors: using RUE Relieving factors: medication (muscle relaxer)  PRECAUTIONS: Fall  RED FLAGS: None     WEIGHT BEARING RESTRICTIONS: No  FALLS:  Has patient fallen in last 6 months? Yes. Number of falls 2  - tripped on feet  LIVING ENVIRONMENT: Lives with: lives with their spouse Lives  in: House/apartment Stairs: Yes: Internal: lives on main level steps; n/a and External: 4-5 steps; on right going up Has following equipment at home: Vannie - 4 wheeled  OCCUPATION: Retired Acupuncturist  PLOF: Independent and Leisure: n/a; does walk 2x/wk  PATIENT GOALS: improve pain in shoulder    OBJECTIVE:  Note: Objective measures were completed at Evaluation unless otherwise noted.  PATIENT SURVEYS:  PSFS: THE PATIENT SPECIFIC FUNCTIONAL SCALE  Place score of 0-10 (0 = unable to perform activity and 10 = able to perform activity at the same level as before injury or problem)  Activity Date: 07/11/2024    Lifting objects 6    Total Score 6      Total Score = Sum of activity scores/number of activities  Minimally Detectable Change: 3 points (for single activity); 2 points (for average score)  [facility] (nd). The Patient Specific Functional Scale . Retrieved from [email or web address]   COGNITION: Overall cognitive status: Within functional limits for tasks assessed  SENSATION: WFL  POSTURE: rounded shoulders, forward head, and increased thoracic kyphosis  PALPATION: Tightness and trigger points in Rt upper trap, levator and infraspinatus   CERVICAL ROM:   ROM A/PROM (deg) Eval 07/23/2024  Flexion 30  Extension 28 (with pain)  Right lateral flexion 10 (with pain)  Left lateral flexion 12 (with pain)  Right rotation 38 (pain)  Left rotation 39 (pain)   (Blank rows = not tested)  UPPER EXTREMITY ROM:  ROM Right Eval 07/23/2024 Left Eval 07/23/2024  Shoulder flexion WNL   Shoulder extension    Shoulder abduction 120 (mild pain) 120  Shoulder adduction    Shoulder extension    Shoulder internal rotation WNL   Shoulder external rotation WNL    (Blank rows = not tested)  UPPER EXTREMITY MMT:  MMT  Right Eval 07/11/2024 Left Eval 07/11/2024  Shoulder flexion 4/5 4/5  Shoulder abduction 3+/5 4/5  Shoulder internal rotation 5/5 5/5  Shoulder external rotation 3+/5 4/5   (Blank rows = not tested)  CERVICAL SPECIAL TESTS:  Deferred at eval - did report some RUE tingling with compression of infraspinatus with manual instruction   TREATMENT DATE:  07/11/2024 [employer] HEP handout provided with patient performing one set of each activity for appropriate form. Verbal and tactile cues provided.   Manual Use of tennis ball for STM at home to posterior shoulder muscles  Self-Care  POC, clinical findings  PATIENT EDUCATION:  Education details: HEP, POC Person educated: Patient Education method: Explanation, Demonstration, and Handouts Education comprehension: verbalized understanding, returned demonstration, and needs further education  HOME EXERCISE PROGRAM: Access Code: [email or web address] URL: [email or web address] Date: 07/23/2024 Prepared by: Corean Ku  Exercises - Standing Cervical Sidebending AROM  - 1-2 x daily - 7 x weekly - 3 sets - 10 reps - Seated Cervical Rotation AROM  - 1-2 x daily - 7 x weekly - 3 sets - 10 reps - Seated Scapular Retraction  - 1-2 x daily - 7 x weekly - 1 sets - 10 reps - 5 sec hold - Seated Upper Trapezius Stretch  - 1-2 x daily - 7 x weekly - 1 sets - 3 reps - 30 sec hold - Standing Shoulder Flexion to 90 Degrees with Dumbbells  - 1-2 x daily - 7 x weekly - 2 sets - 10 reps - Shoulder Abduction with Dumbbells - Thumbs Up  - 1-2 x daily - 7 x weekly - 2 sets - 10 reps - Standing Infraspinatus/Teres Minor Release with  Ball at [employer]  - 1-2 x daily - 7 x weekly - 1 sets - 1 reps - 2-3 min hold  ASSESSMENT:  CLINICAL IMPRESSION: Patient is a 78 y.o. F who was seen today for physical therapy evaluation and treatment for neck and Rt shoulder pain presenting with decreased strength and ROM as well as continued pain and trigger points  affecting functional mobility. Patient will benefit from skilled PT to address above noted deficits.  OBJECTIVE IMPAIRMENTS: decreased balance, decreased ROM, decreased strength, hypomobility, increased fascial restrictions, increased muscle spasms, impaired flexibility, impaired tone, impaired UE functional use, postural dysfunction, and pain.   ACTIVITY LIMITATIONS: carrying, lifting, sleeping, bed mobility, dressing, reach over head, and hygiene/grooming  PARTICIPATION LIMITATIONS: meal prep, cleaning, laundry, driving, shopping, and community activity  PERSONAL FACTORS: Age, Behavior pattern, Past/current experiences, Time since onset of injury/illness/exacerbation, and 3+ comorbidities: anxiety, hx c.diff, depression, HTN, Lt Breast Cancer (active metastatic), UC, HTN, C4-6 ACDF are also affecting patient's functional outcome.   REHAB POTENTIAL: Good  CLINICAL DECISION MAKING: Evolving/moderate complexity  EVALUATION COMPLEXITY: Moderate   GOALS: Goals reviewed with patient? Yes  SHORT TERM GOALS: Target date: 08/13/2024  Patient will show compliance with initial HEP. Goal status: INITIAL  2.  Patient will report pain levels no greater than 5/10 in order to show improved overall quality of life. Goal status: INITIAL  LONG TERM GOALS: Target date: 09/03/2024  Patient will be independent with final HEP in order to maintain and progress upon functional gains made within PT. Goal status: INITIAL  2.   Patient will report pain levels no greater than 3/10 in order to show improved overall quality of life. Goal status: INITIAL  3.  Patient will increase PSFS to at least 8 in order to show a significant improvement in subjective disability rating. Goal status: INITIAL  4.  Patient will increase cervical sidebending ROM to at least 15 deg in order to improve functional mobility. Goal status: INITIAL  5.  Patient will increase cervical rotation ROM to at least 55 deg in order to  improve functional mobility. Goal status: INITIAL  6.  Patient will increase Rt shoulder strength to at least 4/5 in order to improve RUE use and pain. Goal status: INITIAL   PLAN:  PT FREQUENCY: 1x/week  PT DURATION: 6 weeks  PLANNED INTERVENTIONS: [id number]- PT Re-evaluation, 97750- Physical Performance Testing, 97110-Therapeutic exercises, 97530- Therapeutic activity, [id number]- Neuromuscular re-education, 97535- Self Care, [id number]- Manual therapy, [id number]- Gait training, [phone]- Orthotic Initial, [phone]- Orthotic/Prosthetic subsequent, [phone]- Canalith repositioning, [phone]- Aquatic Therapy, [phone]- Electrical stimulation (unattended), [phone]- Electrical stimulation (manual), [id number]- Vasopneumatic device, [id number]- Ultrasound, [id number]- Traction (mechanical), [id number]- Ionotophoresis 4mg /ml Dexamethasone , [id number] (1-2 muscles), [id number] (3+ muscles)- Dry Needling, Patient/Family education, Balance training, Stair training, Taping, Joint mobilization, Joint manipulation, Spinal manipulation, Spinal mobilization, Scar mobilization, Vestibular training, DME instructions, Cryotherapy, and Moist heat  PLAN FOR NEXT SESSION: review HEP, progress postural exercises, manual PRN   NO ESTIM DUE TO ACTIVE CANCER  NEXT MD VISIT: PRN   Corean JULIANNA Ku, PT, DPT 07/23/24 11:20 AM

## 2024-07-30 ENCOUNTER — Encounter: Admitting: Rehabilitative and Restorative Service Providers"

## 2024-07-31 ENCOUNTER — Other Ambulatory Visit: Payer: Self-pay | Admitting: Radiology

## 2024-08-01 NOTE — H&P (Signed)
 Chief Complaint: Metastatic breast cancer, status post surgically placed right internal jugular Port-A-Cath on 04/07/2022; now with fibrin sheath/no blood return after Cathflo used; referred for Port-A-Cath removal and new Port-A-Cath placement  Referring Provider(s): Gudena,V  Supervising Physician: Jenna Hacker  Patient Status: [facility] - Out-pt  History of Present Illness: Allison Pierce is a 78 y.o. female with past medical history significant for anxiety, depression, GERD, hyperlipidemia, hypertension, ulcerative colitis, and metastatic breast cancer, status post surgically placed right internal jugular Port-A-Cath on 04/07/2022.  She presents now with no blood return from port after Cathflo used and evidence of a fibrin sheath on Port-A-Cath injection.  She is scheduled today for existing Port-A-Cath removal and new Port-A-Cath placement.   Patient is Full Code  Past Medical History:  Diagnosis Date   Anxiety    C. difficile diarrhea    Depression    GERD (gastroesophageal reflux disease) OCCASIONALLY  TAKE TUMS   Human papilloma virus    Hyperlipidemia    Hypertension    left breast ca 12/2021   UC (ulcerative colitis) (HCC)    UTI (urinary tract infection)    Vulvar lesion     Past Surgical History:  Procedure Laterality Date   ABDOMINAL HYSTERECTOMY  1992   W/ SALPINGO-OOPHORECTOMY   ANTERIOR CERVICAL DECOMP/DISCECTOMY FUSION  01-18-2009  DR POOLE   C4  - C6   AUGMENTATION MAMMAPLASTY Bilateral    BREAST BIOPSY Right    BREAST BIOPSY Left 02/10/2022   x2   BREAST ENHANCEMENT SURGERY  2011   BREAST EXCISIONAL BIOPSY Left    BREAST RECONSTRUCTION WITH PLACEMENT OF TISSUE EXPANDER AND FLEX HD (ACELLULAR HYDRATED DERMIS) Left 04/07/2022   Procedure: BREAST RECONSTRUCTION WITH PLACEMENT OF TISSUE EXPANDER AND FLEX HD (ACELLULAR HYDRATED DERMIS);  Surgeon: Elisabeth Craig RAMAN, MD;  Location: [facility] OR;  Service: Plastics;  Laterality: Left;   BREAST SURGERY  02-04-2011   dr merrilyn   EXCISION LEFT BREAST MASS--  CALCIFICATION   EYE SURGERY Bilateral    cataract removal   IR CV LINE INJECTION  07/18/2024   MASTECTOMY W/ SENTINEL NODE BIOPSY Left 04/07/2022   Procedure: LEFT MASTECTOMY WITH SENTINEL NODE BIOPSY;  Surgeon: Vernetta Berg, MD;  Location: [facility] OR;  Service: General;  Laterality: Left;  LMA   PORTACATH PLACEMENT N/A 04/07/2022   Procedure: PORT-A-CATH INSERTION WITH ULTRASOUND GUIDANCE;  Surgeon: Vernetta Berg, MD;  Location: [facility] OR;  Service: General;  Laterality: N/A;   REMOVAL OF TISSUE EXPANDER AND PLACEMENT OF IMPLANT Left 03/16/2023   Procedure: Removal of left breast tissue expander and placement of permanent implant;  Surgeon: Waddell Leonce NOVAK, MD;  Location: [facility];  Service: Plastics;  Laterality: Left;   VULVAR LESION REMOVAL  09/10/2012   Procedure: VULVAR LESION;  Surgeon: Carlin LELON Forbes, MD;  Location: [facility];  Service: Gynecology;  Laterality: N/A;  WIDE EXCISION OF VULVAR LESION    Allergies: Oxybutynin, Prozac [fluoxetine hcl], Rosuvastatin, and Lyrica [pregabalin]  Medications: Prior to Admission medications   Medication Sig Start Date End Date Taking? Authorizing Provider  buPROPion  (WELLBUTRIN  XL) 300 MG 24 hr tablet Take 300 mg by mouth in the morning.    [provider]  clonazePAM  (KLONOPIN ) 1 MG tablet Take 1 mg by mouth at bedtime.    [provider]  diphenhydrAMINE  (BENADRYL ) 25 MG tablet Take 25 mg by mouth in the morning and at bedtime.    [provider]  losartan  (COZAAR ) 50 MG tablet  Take 1 tablet (50 mg total) by mouth daily. 07/17/24   Bensimhon, Toribio SAUNDERS, MD  oxyCODONE  (OXY IR/ROXICODONE ) 5 MG immediate release tablet Take 0.5 tablets (2.5 mg total) by mouth every 6 (six) hours as needed for severe pain (pain score 7-10). 07/07/24   Gudena, Vinay, MD  senna (SENOKOT) 8.6 MG TABS tablet Take 1 tablet (8.6 mg total) by mouth 3 (three) times daily.  04/28/24   Gudena, Vinay, MD  tiZANidine  (ZANAFLEX ) 4 MG capsule Take 1 capsule (4 mg total) by mouth at bedtime. 06/26/24   Gretta Bertrum ORN, PA-C  traZODone  (DESYREL ) 150 MG tablet Take 225 mg by mouth at bedtime.    [provider]  venlafaxine  XR (EFFEXOR -XR) 75 MG 24 hr capsule Take 75 mg by mouth daily with breakfast.    [provider]  prochlorperazine  (COMPAZINE ) 10 MG tablet Take 1 tablet (10 mg total) by mouth every 6 (six) hours as needed (Nausea or vomiting). Patient not taking: Reported on 05/12/2022 04/27/22 06/07/22  Odean Potts, MD     Family History  Problem Relation Age of Onset   Lung cancer Mother 33       she smoked   Prostate cancer Brother 92   Breast cancer Cousin        paternal first cousin   Breast cancer Cousin        paternal first cousin   Liver disease Neg Hx    Esophageal cancer Neg Hx    Colon cancer Neg Hx     Social History   Socioeconomic History   Marital status: Married    Spouse name: Lynwood   Number of children: 1   Years of education: 12   Highest education level: Not on file  Occupational History   Occupation: retired  Tobacco Use   Smoking status: Never   Smokeless tobacco: Never  Vaping Use   Vaping status: Never Used  Substance and Sexual Activity   Alcohol use: Yes    Alcohol/week: 7.0 standard drinks of alcohol    Types: 7 Cans of beer per week   Drug use: Never   Sexual activity: Not Currently  Other Topics Concern   Not on file  Social History Narrative   Graduated HS      Right handed      Lives with husband   Social Drivers of Health   Financial Resource Strain: Low Risk  (02/22/2022)   Overall Financial Resource Strain (CARDIA)    Difficulty of Paying Living Expenses: Not hard at all  Food Insecurity: No Food Insecurity (11/25/2023)   Hunger Vital Sign    Worried About Running Out of Food in the Last Year: Never true    Ran Out of Food in the Last Year: Never true  Transportation Needs: No  Transportation Needs (11/25/2023)   PRAPARE - Administrator, Civil Service (Medical): No    Lack of Transportation (Non-Medical): No  Physical Activity: Not on file  Stress: Not on file  Social Connections: Patient Declined (11/25/2023)   Social Connection and Isolation Panel    Frequency of Communication with Friends and Family: Patient declined    Frequency of Social Gatherings with Friends and Family: Patient declined    Attends Religious Services: Patient declined    Database Administrator or Organizations: Patient declined    Attends Banker Meetings: Patient declined    Marital Status: Patient declined       Review of Systems denies fever,HA,CP,dyspnea,  cough, abd pain, N/V or bleeding; she does have back pain    Vital Signs: temp 97.8  BP 176/71  HR 66 R 16 O2 SATS 99% RA    Advance Care Plan: no documents on file    Physical Exam; awake/alert; chest- CTA bilat ; clean, intact rt chest port a cath; heart- RRR; abd-soft,+BS,NT; no LE edema  Imaging: IR CV Line Injection Result Date: 07/18/2024 INDICATION: No blood return. Cathflow instilled. No blood return after cathflow used Briefly, 78 year old female with a history of metastatic breast cancer, s/p surgically-placed RIGHT IJ port (04/07/2022) EXAM: PORT CATHETER EVALUATION COMPARISON:  CHEST XR, 05/13/2024.  CT CAP, 01/28/2024. MEDICATIONS: None. CONTRAST:  None FLUOROSCOPY: Radiation Exposure Index and estimated peak skin dose (PSD); Reference air kerma (RAK), 2 mGy. COMPLICATIONS: None immediate. TECHNIQUE: The procedure, risks, benefits, and alternatives were explained to the patient and informed written consent was obtained. A timeout was performed prior to the initiation of the procedure. The patient's chest port a catheter was accessed by the VIR RN team. The patient was placed supine on the fluoroscopy table. A preprocedural spot fluoroscopic image was obtained of the chest in existing port a  catheter. Note was made of difficulty aspirating blood from the port a catheter. Contrast was injected via the Port a catheter and images were reviewed. The Port a catheter was flushed with a heparin  dwell and de-accessed. A dressing was placed. The patient tolerated the procedure well without immediate postprocedural complication. FINDINGS: *RIGHT jugular-vein approach port catheter with looping at the neck and tip projecting at the expected location of the mid SVC. *There was difficulty aspirating blood from the port a catheter. *Contrast injection demonstrated the presence of a fibrin sheath about the distal end of the catheter. No evidence of catheter kink, fracture or contrast extravasation. IMPRESSION: 1. RIGHT jugular-vein approach port catheter with looping at the neck and tip projecting at the expected location of the mid SVC. 2. Catheter injection POSITIVE for fibrin sheath. RECOMMENDATIONS: The patient elected for port catheter removal and replacement, and the procedure will be scheduled prior to her next chemotherapy infusion. Thom Hall, MD [facility] Electronically Signed   By: Thom Hall M.D.   On: 07/18/2024 16:02   ECHOCARDIOGRAM COMPLETE Result Date: 07/17/2024    ECHOCARDIOGRAM REPORT   Patient Name:   Allison Pierce Date of Exam: 07/17/2024 Medical Rec #:  [id number]               Height:       66.0 in Accession #:    [id number]              Weight:       156.5 lb Date of Birth:  1945-12-15               BSA:          1.802 m Patient Age:    78 years                BP:           148/78 mmHg Patient Gender: F                       HR:           76 bpm. Exam Location:  Outpatient Procedure: 2D Echo and Strain Analysis (Both Spectral and Color Flow Doppler            were  utilized during procedure). Indications:     CHF Chemo  History:         Patient has prior history of Echocardiogram examinations. CHF.  Sonographer:      Norleen Amour Referring Phys:  [id number] TORIBIO SAUNDERS BENSIMHON Diagnosing Phys: Vina Gull MD  Sonographer Comments: Global longitudinal strain was attempted. IMPRESSIONS  1. Left ventricular ejection fraction, by estimation, is 60 to 65%. The left ventricle has normal function. The left ventricle has no regional wall motion abnormalities. Left ventricular diastolic parameters are indeterminate. The global longitudinal strain is abnormal.  2. Right ventricular systolic function is normal. The right ventricular size is normal.  3. The mitral valve is normal in structure. Trivial mitral valve regurgitation.  4. The aortic valve is tricuspid. Aortic valve regurgitation is trivial.  5. The inferior vena cava is normal in size with greater than 50% respiratory variability, suggesting right atrial pressure of 3 mmHg. FINDINGS  Left Ventricle: Left ventricular ejection fraction, by estimation, is 60 to 65%. The left ventricle has normal function. The left ventricle has no regional wall motion abnormalities. Strain was performed and the global longitudinal strain is abnormal. The global longitudinal strain is normal despite suboptimal segment tracking. The left ventricular internal cavity size was normal in size. There is no left ventricular hypertrophy. Left ventricular diastolic parameters are indeterminate. Right Ventricle: The right ventricular size is normal. Right vetricular wall thickness was not assessed. Right ventricular systolic function is normal. Left Atrium: Left atrial size was normal in size. Right Atrium: Right atrial size was normal in size. Pericardium: There is no evidence of pericardial effusion. Mitral Valve: The mitral valve is normal in structure. Trivial mitral valve regurgitation. MV peak gradient, 3.5 mmHg. The mean mitral valve gradient is 2.0 mmHg. Tricuspid Valve: The tricuspid valve is normal in structure. Tricuspid valve regurgitation is trivial. Aortic Valve: The aortic valve is tricuspid. Aortic  valve regurgitation is trivial. Pulmonic Valve: The pulmonic valve was normal in structure. Pulmonic valve regurgitation is not visualized. Aorta: The aortic root and ascending aorta are structurally normal, with no evidence of dilitation. Venous: The inferior vena cava is normal in size with greater than 50% respiratory variability, suggesting right atrial pressure of 3 mmHg. IAS/Shunts: No atrial level shunt detected by color flow Doppler.  LEFT VENTRICLE PLAX 2D LVIDd:         4.50 cm     Diastology LVIDs:         2.60 cm     LV e' medial:    7.10 cm/s LV PW:         0.80 cm     LV E/e' medial:  13.8 LV IVS:        1.10 cm     LV e' lateral:   5.52 cm/s LVOT diam:     1.80 cm     LV E/e' lateral: 17.8 LV SV:         44 LV SV Index:   24 LVOT Area:     2.54 cm  LV Volumes (MOD) LV vol d, MOD A2C: 85.9 ml LV vol d, MOD A4C: 90.5 ml LV vol s, MOD A2C: 38.7 ml LV vol s, MOD A4C: 37.6 ml LV SV MOD A2C:     47.2 ml LV SV MOD A4C:     90.5 ml LV SV MOD BP:      51.0 ml RIGHT VENTRICLE             IVC RV Basal  diam:  3.50 cm     IVC diam: 1.00 cm RV S prime:     13.10 cm/s TAPSE (M-mode): 1.8 cm LEFT ATRIUM             Index        RIGHT ATRIUM           Index LA diam:        3.90 cm 2.16 cm/m   RA Area:     10.40 cm LA Vol (A2C):   58.9 ml 32.69 ml/m  RA Volume:   22.80 ml  12.65 ml/m LA Vol (A4C):   45.6 ml 25.31 ml/m LA Biplane Vol: 54.2 ml 30.08 ml/m  AORTIC VALVE             PULMONIC VALVE LVOT Vmax:   84.20 cm/s  PV Vmax:       1.12 m/s LVOT Vmean:  60.400 cm/s PV Peak grad:  5.0 mmHg LVOT VTI:    0.173 m  AORTA Ao Root diam: 2.70 cm Ao Asc diam:  2.60 cm MITRAL VALVE MV Area (PHT): 2.91 cm    SHUNTS MV Area VTI:   1.49 cm    Systemic VTI:  0.17 m MV Peak grad:  3.5 mmHg    Systemic Diam: 1.80 cm MV Mean grad:  2.0 mmHg MV Vmax:       0.94 m/s MV Vmean:      67.1 cm/s MV Decel Time: 261 msec MV E velocity: 98.10 cm/s MV A velocity: 97.70 cm/s MV E/A ratio:  1.00 Vina Gull MD Electronically signed by Vina Gull MD Signature Date/Time: 07/17/2024/5:34:00 PM    Final (Updated)     Labs:  CBC: Recent Labs    11/29/23 1216 03/31/24 1618 04/22/24 0931 07/11/24 1254  WBC 10.1 6.1 5.7 6.4  HGB 11.3* 10.8* 11.4* 11.2*  HCT 34.8* 32.3* 34.5* 36.3  PLT 345 237 293 294    COAGS: Recent Labs    11/25/23 1155  INR 1.0  APTT 27    BMP: Recent Labs    11/29/23 1216 01/28/24 1435 03/31/24 1618 04/22/24 0931 07/11/24 1254  NA 133*  --  133* 134* 134*  K 4.5  --  4.5 4.1 3.8  CL 100  --  101 101 98  CO2 29  --  27 26 26   GLUCOSE 97  --  134* 121* 114*  BUN 13  --  22 15 12   CALCIUM 9.1  --  8.8* 8.8* 9.7  CREATININE 0.96 1.10* 1.66* 1.59* 1.47*  GFRNONAA >60  --  31* 33* 36*    LIVER FUNCTION TESTS: Recent Labs    11/29/23 1216 03/31/24 1618 04/22/24 0931 07/11/24 1254  BILITOT 0.4 0.4 0.4 0.3  AST 23 20 19 21   ALT 30 17 16 17   ALKPHOS 78 81 81 87  PROT 6.8 6.2* 6.4* 6.8  ALBUMIN 4.0 3.9 3.9 4.2    TUMOR MARKERS: No results for input(s): AFPTM, CEA, CA199, CHROMGRNA in the last 8760 hours.  Assessment and Plan: 78 y.o. female with past medical history significant for anxiety, depression, GERD, hyperlipidemia, hypertension, ulcerative colitis, and metastatic breast cancer, status post surgically placed right internal jugular Port-A-Cath on 04/07/2022.  She presents now with no blood return from port after Cathflo used and evidence of a fibrin sheath on Port-A-Cath injection.  She is scheduled today for existing Port-A-Cath removal and new Port-A-Cath placement.Risks and benefits of image guided port-a-catheter removal/replacement was discussed with the patient including,  but not limited to bleeding, infection, pneumothorax, or fibrin sheath development and need for additional procedures.  All of the patient's questions were answered, patient is agreeable to proceed. Consent signed and in chart.    Thank you for allowing our service to participate in Allison Pierce 's care.  Electronically Signed: D. Franky Rakers, PA-C   08/01/2024, 4:02 PM      I spent a total of  20 minutes   in face to face in clinical consultation, greater than 50% of which was counseling/coordinating care for port a cath removal/replacement

## 2024-08-04 ENCOUNTER — Other Ambulatory Visit: Payer: Self-pay

## 2024-08-04 ENCOUNTER — Encounter (HOSPITAL_COMMUNITY): Payer: Self-pay

## 2024-08-04 ENCOUNTER — Ambulatory Visit (HOSPITAL_COMMUNITY)
Admission: RE | Admit: 2024-08-04 | Discharge: 2024-08-04 | Disposition: A | Discharge status: Home / Self Care | Source: Ambulatory Visit | Attending: Hematology and Oncology | Admitting: Hematology and Oncology

## 2024-08-04 ENCOUNTER — Encounter: Payer: Self-pay | Admitting: Radiology

## 2024-08-04 ENCOUNTER — Other Ambulatory Visit (HOSPITAL_COMMUNITY): Payer: Self-pay | Admitting: Hematology and Oncology

## 2024-08-04 ENCOUNTER — Ambulatory Visit (HOSPITAL_COMMUNITY)
Admission: RE | Admit: 2024-08-04 | Discharge: 2024-08-04 | Disposition: A | Source: Ambulatory Visit | Attending: Hematology and Oncology

## 2024-08-04 DIAGNOSIS — E785 Hyperlipidemia, unspecified: Secondary | ICD-10-CM | POA: Diagnosis not present

## 2024-08-04 DIAGNOSIS — F32A Depression, unspecified: Secondary | ICD-10-CM | POA: Diagnosis not present

## 2024-08-04 DIAGNOSIS — Z171 Estrogen receptor negative status [ER-]: Secondary | ICD-10-CM | POA: Insufficient documentation

## 2024-08-04 DIAGNOSIS — C50412 Malignant neoplasm of upper-outer quadrant of left female breast: Secondary | ICD-10-CM | POA: Diagnosis not present

## 2024-08-04 DIAGNOSIS — Z95828 Presence of other vascular implants and grafts: Secondary | ICD-10-CM

## 2024-08-04 DIAGNOSIS — I10 Essential (primary) hypertension: Secondary | ICD-10-CM | POA: Insufficient documentation

## 2024-08-04 DIAGNOSIS — T82828A Fibrosis of vascular prosthetic devices, implants and grafts, initial encounter: Secondary | ICD-10-CM | POA: Diagnosis not present

## 2024-08-04 DIAGNOSIS — T82528A Displacement of other cardiac and vascular devices and implants, initial encounter: Secondary | ICD-10-CM | POA: Diagnosis not present

## 2024-08-04 DIAGNOSIS — K219 Gastro-esophageal reflux disease without esophagitis: Secondary | ICD-10-CM | POA: Insufficient documentation

## 2024-08-04 DIAGNOSIS — C50912 Malignant neoplasm of unspecified site of left female breast: Secondary | ICD-10-CM | POA: Diagnosis not present

## 2024-08-04 DIAGNOSIS — F419 Anxiety disorder, unspecified: Secondary | ICD-10-CM | POA: Insufficient documentation

## 2024-08-04 DIAGNOSIS — Z79899 Other long term (current) drug therapy: Secondary | ICD-10-CM | POA: Diagnosis not present

## 2024-08-04 DIAGNOSIS — C7951 Secondary malignant neoplasm of bone: Secondary | ICD-10-CM | POA: Insufficient documentation

## 2024-08-04 HISTORY — PX: IR IMAGING GUIDED PORT INSERTION: IMG5740

## 2024-08-04 MED ORDER — LIDOCAINE-EPINEPHRINE 1 %-1:100000 IJ SOLN
INTRAMUSCULAR | Status: AC
  Filled 2024-08-04: qty 1

## 2024-08-04 MED ORDER — SODIUM CHLORIDE 0.9 % IV SOLN: INTRAVENOUS | Status: DC

## 2024-08-04 MED ORDER — FENTANYL CITRATE (PF) 100 MCG/2ML IJ SOLN
INTRAMUSCULAR | Status: AC
  Filled 2024-08-04: qty 2

## 2024-08-04 MED ORDER — LIDOCAINE-EPINEPHRINE 1 %-1:100000 IJ SOLN
20.0000 mL | Freq: Once | INTRAMUSCULAR | Status: AC
  Administered 2024-08-04: 20 mL via INTRADERMAL

## 2024-08-04 MED ORDER — FENTANYL CITRATE (PF) 100 MCG/2ML IJ SOLN
INTRAMUSCULAR | Status: AC | PRN
  Administered 2024-08-04 (×2): 50 ug via INTRAVENOUS

## 2024-08-04 MED ORDER — MIDAZOLAM HCL (PF) 2 MG/2ML IJ SOLN
INTRAMUSCULAR | Status: AC | PRN
  Administered 2024-08-04 (×3): 1 mg via INTRAVENOUS

## 2024-08-04 MED ORDER — LIDOCAINE HCL 1 % IJ SOLN
INTRAMUSCULAR | Status: AC
  Filled 2024-08-04: qty 20

## 2024-08-04 MED ORDER — HEPARIN SOD (PORK) LOCK FLUSH 100 UNIT/ML IV SOLN
500.0000 [IU] | Freq: Once | INTRAVENOUS | Status: AC
  Administered 2024-08-04: 500 [IU] via INTRAVENOUS

## 2024-08-04 MED ORDER — HEPARIN SOD (PORK) LOCK FLUSH 100 UNIT/ML IV SOLN
INTRAVENOUS | Status: AC
  Filled 2024-08-04: qty 5

## 2024-08-04 MED ORDER — MIDAZOLAM HCL 2 MG/2ML IJ SOLN
INTRAMUSCULAR | Status: AC
  Filled 2024-08-04: qty 2

## 2024-08-04 MED ORDER — LIDOCAINE HCL 1 % IJ SOLN: 20.0000 mL | Freq: Once | INTRAMUSCULAR | Status: DC

## 2024-08-04 MED ORDER — HEPARIN SOD (PORK) LOCK FLUSH 100 UNIT/ML IV SOLN
INTRAVENOUS | Status: AC | PRN
  Administered 2024-08-04: 500 [IU] via INTRAVENOUS

## 2024-08-04 NOTE — Sedation Documentation (Signed)
 Left port complete.  Moving to right side for removal

## 2024-08-04 NOTE — Procedures (Signed)
 Interventional Radiology Procedure Note  Procedure: chest port placement and removal  Complications: None  Estimated Blood Loss: < 10 mL  Findings: LIJ chest port placement.  RIJ chest port removal.  Cordella DELENA Banner, MD

## 2024-08-04 NOTE — Discharge Instructions (Signed)
 Please call Interventional Radiology clinic [phone] with any questions or concerns.  You may remove your dressing and shower tomorrow.  After the procedure, it is common to have: Discomfort at the port insertion site. Bruising on the skin over the port. This should improve over 3-4 days  Follow these instructions at home:  Medication: Do not use Aspirin or ibuprofen products, such as Advil or Motrin, as it may increase bleeding.  You may resume your usual medications as ordered by your doctor. If your doctor prescribed antibiotics, take them as directed. Do not stop taking them just because you feel better. You need to take the full course of antibiotics.  Eating and drinking: Drink plenty of liquids to keep your urine pale yellow You can resume your regular diet as directed by your doctor   Care of the procedure site Follow instructions from your health care provider about how to take care of your port insertion site. Make sure you: After your port is placed, you will get a manufacturer's information card. The card has information about your port. Keep this card with you at all times Make sure to remember what type of port you have Take care of the port as told by your health care provider DO NOT use EMLA cream for 2 weeks after port placement -the cream will remove the surgical glue on your incision DO NOT use any lotions, creams, or ointments on incision for 2 weeks. This will remove the surgical glue on your incision Wash your hands with soap and water before and after you change your bandage (dressing). If soap and water are not available, use hand sanitizer Change your dressing as told by your health care provider Leave skin glue, or adhesive strips in place. These skin closures may need to stay in place for 2 weeks or longer Check your port insertion site every day for signs of infection. Check for: Redness, swelling, or pain Fluid or blood Warmth Pus or a bad  smell  Activity Return to your normal activities as told by your health care provider. Ask your health care provider what activities are safe for you Do not lift anything that is heavier than 10 lb (4.5 kg), or the limit that you are told, until your health care provider says that it is safe Do not take baths, swim, or use a hot tub until your health care provider approves. Take showers only. Keep all follow-up visits as told by your doctor  Contact a health care provider if: You cannot flush your port with saline as directed, or you cannot draw blood from the port You have a fever or chills You have redness, swelling, or pain around your port insertion site You have fluid or blood coming from your port insertion site Your port insertion site feels warm to the touch You have pus or a bad smell coming from the port insertion site  Get help right away if: You have chest pain or shortness of breath You have bleeding from your port that you cannot control  Moderate Conscious Sedation-Care After  This sheet gives you information about how to care for yourself after your procedure. Your health care provider may also give you more specific instructions. If you have problems or questions, contact your health care provider.  After the procedure, it is common to have: Sleepiness for several hours. Impaired judgment for several hours. Difficulty with balance. Vomiting if you eat too soon.  Follow these instructions at home:  Rest. Do not  participate in activities where you could fall or become injured. Do not drive or use machinery. Do not drink alcohol. Do not take sleeping pills or medicines that cause drowsiness. Do not make important decisions or sign legal documents. Do not take care of children on your own.  Eating and drinking Follow the diet recommended by your health care provider. Drink enough fluid to keep your urine pale yellow. If you vomit: Drink water, juice, or soup  when you can drink without vomiting. Make sure you have little or no nausea before eating solid foods.  General instructions Take over-the-counter and prescription medicines only as told by your health care provider. Have a responsible adult stay with you for the time you are told. It is important to have someone help care for you until you are awake and alert. Do not smoke. Keep all follow-up visits as told by your health care provider. This is important.  Contact a health care provider if: You are still sleepy or having trouble with balance after 24 hours. You feel light-headed. You keep feeling nauseous or you keep vomiting. You develop a rash. You have a fever. You have redness or swelling around the IV site.  Get help right away if: You have trouble breathing. You have new-onset confusion at home.  This information is not intended to replace advice given to you by your health care provider. Make sure you discuss any questions you have with your healthcare provider.

## 2024-08-05 ENCOUNTER — Telehealth: Payer: Self-pay | Admitting: *Deleted

## 2024-08-05 NOTE — Telephone Encounter (Signed)
 PC to patient, informed MRI brain has been scheduled on 09/02/24 at [facility] at 3:00, she needs to arrive at 2:30.  Her appointment with Dr Buckley to review MRI results is 09/16/24 at 12:30.  She verbalizes understanding.

## 2024-08-06 ENCOUNTER — Encounter: Admitting: Physical Therapy

## 2024-08-11 ENCOUNTER — Inpatient Hospital Stay: Attending: Hematology and Oncology

## 2024-08-11 VITALS — BP 172/89 | HR 75 | Temp 97.7°F | Resp 18 | Wt 157.0 lb

## 2024-08-11 DIAGNOSIS — Z9012 Acquired absence of left breast and nipple: Secondary | ICD-10-CM | POA: Diagnosis not present

## 2024-08-11 DIAGNOSIS — E236 Other disorders of pituitary gland: Secondary | ICD-10-CM | POA: Insufficient documentation

## 2024-08-11 DIAGNOSIS — Z1722 Progesterone receptor negative status: Secondary | ICD-10-CM | POA: Insufficient documentation

## 2024-08-11 DIAGNOSIS — C50412 Malignant neoplasm of upper-outer quadrant of left female breast: Secondary | ICD-10-CM | POA: Diagnosis not present

## 2024-08-11 DIAGNOSIS — G893 Neoplasm related pain (acute) (chronic): Secondary | ICD-10-CM | POA: Insufficient documentation

## 2024-08-11 DIAGNOSIS — Z5112 Encounter for antineoplastic immunotherapy: Secondary | ICD-10-CM | POA: Diagnosis not present

## 2024-08-11 DIAGNOSIS — Z79891 Long term (current) use of opiate analgesic: Secondary | ICD-10-CM | POA: Diagnosis not present

## 2024-08-11 DIAGNOSIS — Z171 Estrogen receptor negative status [ER-]: Secondary | ICD-10-CM | POA: Diagnosis not present

## 2024-08-11 DIAGNOSIS — C7951 Secondary malignant neoplasm of bone: Secondary | ICD-10-CM | POA: Insufficient documentation

## 2024-08-11 MED ORDER — TRASTUZUMAB-DKST CHEMO 150 MG IV SOLR
6.0000 mg/kg | Freq: Once | INTRAVENOUS | Status: AC
  Administered 2024-08-11: 420 mg via INTRAVENOUS
  Filled 2024-08-11: qty 20

## 2024-08-11 MED ORDER — DIPHENHYDRAMINE HCL 25 MG PO CAPS
25.0000 mg | ORAL_CAPSULE | Freq: Once | ORAL | Status: AC
  Administered 2024-08-11: 25 mg via ORAL
  Filled 2024-08-11: qty 1

## 2024-08-11 MED ORDER — SODIUM CHLORIDE 0.9 % IV SOLN: Freq: Once | INTRAVENOUS | Status: AC

## 2024-08-11 MED ORDER — ACETAMINOPHEN 325 MG PO TABS
650.0000 mg | ORAL_TABLET | Freq: Once | ORAL | Status: AC
  Administered 2024-08-11: 650 mg via ORAL
  Filled 2024-08-11: qty 2

## 2024-08-11 NOTE — Patient Instructions (Signed)
 [facility] - A DEPT OF [facility]  Discharge Instructions: Thank you for choosing [facility] to provide your oncology and hematology care.   If you have a lab appointment with the Cancer Center, please go directly to the Cancer Center and check in at the registration area.   Wear comfortable clothing and clothing appropriate for easy access to any Portacath or PICC line.   We strive to give you quality time with your provider. You may need to reschedule your appointment if you arrive late (15 or more minutes).  Arriving late affects you and other patients whose appointments are after yours.  Also, if you miss three or more appointments without notifying the office, you may be dismissed from the clinic at the provider's discretion.      For prescription refill requests, have your pharmacy contact our office and allow 72 hours for refills to be completed.    Today you received the following chemotherapy and/or immunotherapy agents: trastuzumab-dkst (OGIVRI)       To help prevent nausea and vomiting after your treatment, we encourage you to take your nausea medication as directed.  BELOW ARE SYMPTOMS THAT SHOULD BE REPORTED IMMEDIATELY: *FEVER GREATER THAN 100.4 F (38 C) OR HIGHER *CHILLS OR SWEATING *NAUSEA AND VOMITING THAT IS NOT CONTROLLED WITH YOUR NAUSEA MEDICATION *UNUSUAL SHORTNESS OF BREATH *UNUSUAL BRUISING OR BLEEDING *URINARY PROBLEMS (pain or burning when urinating, or frequent urination) *BOWEL PROBLEMS (unusual diarrhea, constipation, pain near the anus) TENDERNESS IN MOUTH AND THROAT WITH OR WITHOUT PRESENCE OF ULCERS (sore throat, sores in mouth, or a toothache) UNUSUAL RASH, SWELLING OR PAIN  UNUSUAL VAGINAL DISCHARGE OR ITCHING   Items with * indicate a potential emergency and should be followed up as soon as possible or go to the Emergency Department if any problems should occur.  Please show the CHEMOTHERAPY ALERT CARD  or IMMUNOTHERAPY ALERT CARD at check-in to the Emergency Department and triage nurse.  Should you have questions after your visit or need to cancel or reschedule your appointment, please contact [facility] - A DEPT OF [facility]  Dept: [phone]  and follow the prompts.  Office hours are 8:00 a.m. to 4:30 p.m. Monday - Friday. Please note that voicemails left after 4:00 p.m. may not be returned until the following business day.  We are closed weekends and major holidays. You have access to a nurse at all times for urgent questions. Please call the main number to the clinic Dept: [phone] and follow the prompts.   For any non-urgent questions, you may also contact your provider using MyChart. We now offer e-Visits for anyone 89 and older to request care online for non-urgent symptoms. For details visit [email or web address].   Also download the MyChart app! Go to the app store, search "MyChart", open the app, select [place], and log in with your MyChart username and password.

## 2024-08-13 ENCOUNTER — Other Ambulatory Visit (HOSPITAL_COMMUNITY): Payer: Self-pay

## 2024-08-13 NOTE — Therapy (Addendum)
 " OUTPATIENT PHYSICAL THERAPY CERVICAL TREATMENT / DISCHARGE    Patient Name: Allison Pierce MRN: [id number] DOB:10/22/1945, 78 y.o., female Today's Date: 08/14/2024  END OF SESSION:  PT End of Session - 08/14/24 1259     Visit Number 2    Number of Visits 6    Date for Recertification  09/03/24    Authorization Type Medicare    Progress Note Due on Visit 10    PT Start Time 1301    PT Stop Time 1341    PT Time Calculation (min) 40 min    Activity Tolerance Patient tolerated treatment well    Behavior During Therapy [facility] for tasks assessed/performed           Past Medical History:  Diagnosis Date   Anxiety    C. difficile diarrhea    Depression    GERD (gastroesophageal reflux disease) OCCASIONALLY  TAKE TUMS   Human papilloma virus    Hyperlipidemia    Hypertension    left breast ca 12/2021   UC (ulcerative colitis) (HCC)    UTI (urinary tract infection)    Vulvar lesion    Past Surgical History:  Procedure Laterality Date   ABDOMINAL HYSTERECTOMY  1992   W/ SALPINGO-OOPHORECTOMY   ANTERIOR CERVICAL DECOMP/DISCECTOMY FUSION  01-18-2009  DR POOLE   C4  - C6   AUGMENTATION MAMMAPLASTY Bilateral    BREAST BIOPSY Right    BREAST BIOPSY Left 02/10/2022   x2   BREAST ENHANCEMENT SURGERY  2011   BREAST EXCISIONAL BIOPSY Left    BREAST RECONSTRUCTION WITH PLACEMENT OF TISSUE EXPANDER AND FLEX HD (ACELLULAR HYDRATED DERMIS) Left 04/07/2022   Procedure: BREAST RECONSTRUCTION WITH PLACEMENT OF TISSUE EXPANDER AND FLEX HD (ACELLULAR HYDRATED DERMIS);  Surgeon: Elisabeth Craig RAMAN, MD;  Location: [facility] OR;  Service: Plastics;  Laterality: Left;   BREAST SURGERY  02-04-2011  dr merrilyn   EXCISION LEFT BREAST MASS--  CALCIFICATION   EYE SURGERY Bilateral    cataract removal   IR CV LINE INJECTION  07/18/2024   IR IMAGING GUIDED PORT INSERTION  08/04/2024   MASTECTOMY W/ SENTINEL NODE BIOPSY Left 04/07/2022   Procedure: LEFT MASTECTOMY WITH SENTINEL NODE BIOPSY;  Surgeon:  Vernetta Berg, MD;  Location: [facility] OR;  Service: General;  Laterality: Left;  LMA   PORTACATH PLACEMENT N/A 04/07/2022   Procedure: PORT-A-CATH INSERTION WITH ULTRASOUND GUIDANCE;  Surgeon: Vernetta Berg, MD;  Location: [facility] OR;  Service: General;  Laterality: N/A;   REMOVAL OF TISSUE EXPANDER AND PLACEMENT OF IMPLANT Left 03/16/2023   Procedure: Removal of left breast tissue expander and placement of permanent implant;  Surgeon: Waddell Leonce NOVAK, MD;  Location: [facility];  Service: Plastics;  Laterality: Left;   VULVAR LESION REMOVAL  09/10/2012   Procedure: VULVAR LESION;  Surgeon: Carlin LELON Forbes, MD;  Location: [facility];  Service: Gynecology;  Laterality: N/A;  WIDE EXCISION OF VULVAR LESION   Patient Active Problem List   Diagnosis Date Noted   Cellulitis of right upper extremity 11/26/2023   Anxiety and depression 11/25/2023   Sepsis due to cellulitis (HCC) 11/25/2023   AKI (acute kidney injury) 11/25/2023   Weakness 11/25/2023   Hyponatremia 11/21/2023   Hypokalemia 11/21/2023   Mild protein malnutrition 11/21/2023   Normocytic anemia 11/21/2023   COVID-19 virus infection 11/21/2023   Cardiotoxicity 11/20/2023   Pituitary mass 09/11/2023   Metastasis to bone (HCC) 06/12/2023   Colitis 05/13/2022   Alcohol abuse 05/12/2022  Irritable bowel syndrome 05/12/2022   Mild cognitive impairment 05/12/2022   Urinary incontinence 05/12/2022   Allergic rhinitis 05/12/2022   Recurrent major depression in remission 05/12/2022   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HCC) 05/12/2022   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   Acute colitis 05/12/2022   Lactic acidosis 05/12/2022   Chemotherapy induced neutropenia 05/12/2022   Chronic diastolic CHF (congestive heart failure) (HCC) 05/12/2022   Oral mucositis 05/12/2022   Noninfective gastroenteritis and colitis, unspecified 05/12/2022   Port-A-Cath in place 05/05/2022   Genetic testing 03/06/2022   Family history  of breast cancer 02/23/2022   Family history of prostate cancer 02/23/2022   Essential hypertension 02/22/2022   Malignant neoplasm of upper-outer quadrant of left breast in female, estrogen receptor negative (HCC) 02/20/2022   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 02/20/2022   Anxiety state 01/24/2013   Arthropathy 01/24/2013   Depressive disorder, not elsewhere classified 01/24/2013   Mixed hyperlipidemia 01/24/2013    PCP: Joen Gentry, MD   REFERRING PROVIDER: Bertrum Gaskins, PA-C  REFERRING DIAG: M54.2 (ICD-10-CM) - Neck pain M25.511 (ICD-10-CM) - Acute pain of right shoulder  THERAPY DIAG:  Cervicalgia  Muscle weakness (generalized)  Acute pain of right shoulder  Abnormal posture  Rationale for Evaluation and Treatment: Rehabilitation  ONSET DATE: 4-5 months ago   SUBJECTIVE:                                                                                                                                                                                                         SUBJECTIVE STATEMENT: Patient reports that her pain can be bad and sometimes can be fine.   Hand dominance: Right  PERTINENT HISTORY:  anxiety, hx c.diff, depression, HTN, Lt Breast Cancer (active metastatic), UC, HTN, C4-6 ACDF  Pt reports she fell about 4-5 months ago landing on her Rt shoulder causing pain.  She also fell about a week ago falling on the Rt shoulder as well.  She reports pain continues to get worse and has not improved.   PAIN:  Are you having pain? Yes: NPRS scale: currently 7, up to 9, at best 3/10 Pain location: Rt shoulder and neck Pain description: it's just pain aching all the time Aggravating factors: using RUE Relieving factors: medication (muscle relaxer)  PRECAUTIONS: Fall  RED FLAGS: None     WEIGHT BEARING RESTRICTIONS: No  FALLS:  Has patient fallen in last 6 months? Yes. Number of falls 2 - tripped on  feet  LIVING ENVIRONMENT: Lives with: lives with their  spouse Lives in: House/apartment Stairs: Yes: Internal: lives on main level steps; n/a and External: 4-5 steps; on right going up Has following equipment at home: Vannie - 4 wheeled  OCCUPATION: Retired Acupuncturist  PLOF: Independent and Leisure: n/a; does walk 2x/wk  PATIENT GOALS: improve pain in shoulder    OBJECTIVE:  Note: Objective measures were completed at Evaluation unless otherwise noted.  PATIENT SURVEYS:  PSFS: THE PATIENT SPECIFIC FUNCTIONAL SCALE  Place score of 0-10 (0 = unable to perform activity and 10 = able to perform activity at the same level as before injury or problem)  Activity Date: 07/11/2024    Lifting objects 6    Total Score 6      Total Score = Sum of activity scores/number of activities  Minimally Detectable Change: 3 points (for single activity); 2 points (for average score)  [facility] (nd). The Patient Specific Functional Scale . Retrieved from [email or web address]   COGNITION: Overall cognitive status: Within functional limits for tasks assessed  SENSATION: WFL  POSTURE: rounded shoulders, forward head, and increased thoracic kyphosis  PALPATION: Tightness and trigger points in Rt upper trap, levator and infraspinatus   CERVICAL ROM:   ROM A/PROM (deg) Eval 07/23/2024  Flexion 30  Extension 28 (with pain)  Right lateral flexion 10 (with pain)  Left lateral flexion 12 (with pain)  Right rotation 38 (pain)  Left rotation 39 (pain)   (Blank rows = not tested)  UPPER EXTREMITY ROM:  ROM Right Eval 07/23/2024 Left Eval 07/23/2024  Shoulder flexion WNL   Shoulder extension    Shoulder abduction 120 (mild pain) 120  Shoulder adduction    Shoulder extension    Shoulder internal rotation WNL   Shoulder external rotation WNL    (Blank rows = not tested)  UPPER EXTREMITY MMT:  MMT  Right Eval 07/11/2024 Left Eval 07/11/2024  Shoulder flexion 4/5 4/5  Shoulder abduction 3+/5 4/5  Shoulder internal rotation 5/5 5/5  Shoulder external rotation 3+/5 4/5   (Blank rows = not tested)  CERVICAL SPECIAL TESTS:  Deferred at eval - did report some RUE tingling with compression of infraspinatus with manual instruction   TREATMENT DATE:  08/14/2024 TherEx:  UBE with bilat UE 6 min level 1  Seated cervical sidebending 1x10 each direction  Seated cervical rotation 1x10 each direction  Seated scapular retraction 2x10 with 3s hold  Seated UT stretch 2x30s each side  Standing shoulder flexion to 90deg 1x20 with 1# DBs Standing shoulder abduction 1x20 with 1# DBs Standing shoulder scaption 1x20 with 1# DBs Seated ER and scapular retraction with yellow TB 2x10  ER walkouts with red TB 1x7  Several tactile cues required throughout for appropriate form IR walkouts with red TB 1x7 Several tactile cues required throughout for appropriate form  07/11/2024 TherEx HEP handout provided with patient performing one set of each activity for appropriate form. Verbal and tactile cues provided.   Manual Use of tennis ball for STM at home to posterior shoulder muscles  Self-Care  POC, clinical findings  PATIENT EDUCATION:  Education details: HEP, POC Person educated: Patient Education method: Explanation, Demonstration, and Handouts Education comprehension: verbalized understanding, returned demonstration, and needs further education  HOME EXERCISE PROGRAM: Access Code: [email or web address] URL: [email or web address] Date: 07/23/2024 Prepared by: Corean Ku  Exercises - Standing Cervical Sidebending AROM  - 1-2 x daily - 7 x weekly - 3 sets - 10 reps - Seated Cervical Rotation AROM  - 1-2 x daily - 7 x weekly -  3 sets - 10 reps - Seated Scapular Retraction  - 1-2 x daily - 7 x weekly - 1 sets - 10 reps - 5 sec hold - Seated Upper Trapezius Stretch  - 1-2 x  daily - 7 x weekly - 1 sets - 3 reps - 30 sec hold - Standing Shoulder Flexion to 90 Degrees with Dumbbells  - 1-2 x daily - 7 x weekly - 2 sets - 10 reps - Shoulder Abduction with Dumbbells - Thumbs Up  - 1-2 x daily - 7 x weekly - 2 sets - 10 reps - Standing Infraspinatus/Teres Minor Release with Ball at Wall  - 1-2 x daily - 7 x weekly - 1 sets - 1 reps - 2-3 min hold  ASSESSMENT:  CLINICAL IMPRESSION: Patient arrived to session noting same symptoms since initial eval and has been very busy with active chemo. Patient tolerated most treatment well, though had minimal increases in pain with several activities. Patient is highly limited in cervical ROM. Patient will continue to benefit from skilled PT.   OBJECTIVE IMPAIRMENTS: decreased balance, decreased ROM, decreased strength, hypomobility, increased fascial restrictions, increased muscle spasms, impaired flexibility, impaired tone, impaired UE functional use, postural dysfunction, and pain.   ACTIVITY LIMITATIONS: carrying, lifting, sleeping, bed mobility, dressing, reach over head, and hygiene/grooming  PARTICIPATION LIMITATIONS: meal prep, cleaning, laundry, driving, shopping, and community activity  PERSONAL FACTORS: Age, Behavior pattern, Past/current experiences, Time since onset of injury/illness/exacerbation, and 3+ comorbidities: anxiety, hx c.diff, depression, HTN, Lt Breast Cancer (active metastatic), UC, HTN, C4-6 ACDF are also affecting patient's functional outcome.   REHAB POTENTIAL: Good  CLINICAL DECISION MAKING: Evolving/moderate complexity  EVALUATION COMPLEXITY: Moderate   GOALS: Goals reviewed with patient? Yes  SHORT TERM GOALS: Target date: 08/13/2024  Patient will show compliance with initial HEP. Goal status: INITIAL  2.  Patient will report pain levels no greater than 5/10 in order to show improved overall quality of life. Goal status: INITIAL  LONG TERM GOALS: Target date: 09/03/2024  Patient will be  independent with final HEP in order to maintain and progress upon functional gains made within PT. Goal status: INITIAL  2.   Patient will report pain levels no greater than 3/10 in order to show improved overall quality of life. Goal status: INITIAL  3.  Patient will increase PSFS to at least 8 in order to show a significant improvement in subjective disability rating. Goal status: INITIAL  4.  Patient will increase cervical sidebending ROM to at least 15 deg in order to improve functional mobility. Goal status: INITIAL  5.  Patient will increase cervical rotation ROM to at least 55 deg in order to improve functional mobility. Goal status: INITIAL  6.  Patient will increase Rt shoulder strength to at least 4/5 in order to improve RUE use and pain. Goal status: INITIAL   PLAN:  PT FREQUENCY: 1x/week  PT DURATION: 6 weeks  PLANNED INTERVENTIONS: [id number]- PT Re-evaluation, 97750- Physical Performance Testing, 97110-Therapeutic exercises, 97530- Therapeutic activity, [id number]- Neuromuscular re-education, 97535- Self Care, [id number]- Manual therapy, [id number]- Gait training, [phone]- Orthotic Initial, [phone]- Orthotic/Prosthetic subsequent, [phone]- Canalith repositioning, [phone]- Aquatic Therapy, [phone]- Electrical stimulation (unattended), [phone]- Electrical stimulation (manual), [id number]- Vasopneumatic device, [id number]- Ultrasound, [id number]- Traction (mechanical), [id number]- Ionotophoresis 4mg /ml Dexamethasone , 79439 (1-2 muscles), 20561 (3+ muscles)- Dry Needling, Patient/Family education, Balance training, Stair training, Taping, Joint mobilization, Joint manipulation, Spinal manipulation, Spinal mobilization, Scar mobilization, Vestibular training, DME instructions, Cryotherapy, and Moist heat  PLAN FOR NEXT SESSION: , progress postural  exercises, manual PRN, cervical ROM, UE strength   NO ESTIM DUE TO ACTIVE CANCER  NEXT MD VISIT: PRN   PHYSICAL THERAPY DISCHARGE SUMMARY  Visits from Start of Care: 2  Current  functional level related to goals / functional outcomes: See above    Remaining deficits: See above    Education / Equipment: HEP   Patient agrees to discharge. Patient goals were not met. Patient is being discharged due to not returning since the last visit.    Susannah Daring, PT, DPT 08/14/24 1:45 PM          "

## 2024-08-14 ENCOUNTER — Ambulatory Visit (INDEPENDENT_AMBULATORY_CARE_PROVIDER_SITE_OTHER)

## 2024-08-14 DIAGNOSIS — M542 Cervicalgia: Secondary | ICD-10-CM | POA: Diagnosis not present

## 2024-08-14 DIAGNOSIS — M25511 Pain in right shoulder: Secondary | ICD-10-CM | POA: Diagnosis not present

## 2024-08-14 DIAGNOSIS — M6281 Muscle weakness (generalized): Secondary | ICD-10-CM

## 2024-08-14 DIAGNOSIS — R293 Abnormal posture: Secondary | ICD-10-CM

## 2024-08-20 ENCOUNTER — Other Ambulatory Visit: Payer: Self-pay | Admitting: Orthopaedic Surgery

## 2024-08-20 ENCOUNTER — Encounter: Admitting: Rehabilitative and Restorative Service Providers"

## 2024-08-20 ENCOUNTER — Other Ambulatory Visit: Payer: Self-pay | Admitting: Physician Assistant

## 2024-08-20 ENCOUNTER — Telehealth: Payer: Self-pay | Admitting: Physician Assistant

## 2024-08-20 MED ORDER — TIZANIDINE HCL 4 MG PO CAPS: 4.0000 mg | ORAL_CAPSULE | Freq: Two times a day (BID) | ORAL | 1 refills | Status: AC | PRN

## 2024-08-20 NOTE — Telephone Encounter (Signed)
 Patient called. She would like a refill on her pain medication.

## 2024-08-25 ENCOUNTER — Encounter: Admitting: Physical Therapy

## 2024-09-02 ENCOUNTER — Ambulatory Visit (HOSPITAL_COMMUNITY)
Admission: RE | Admit: 2024-09-02 | Discharge: 2024-09-02 | Disposition: A | Source: Ambulatory Visit | Attending: Internal Medicine

## 2024-09-02 DIAGNOSIS — Z86018 Personal history of other benign neoplasm: Secondary | ICD-10-CM | POA: Diagnosis not present

## 2024-09-02 DIAGNOSIS — Z853 Personal history of malignant neoplasm of breast: Secondary | ICD-10-CM | POA: Diagnosis not present

## 2024-09-02 DIAGNOSIS — G939 Disorder of brain, unspecified: Secondary | ICD-10-CM | POA: Diagnosis not present

## 2024-09-02 DIAGNOSIS — E236 Other disorders of pituitary gland: Secondary | ICD-10-CM | POA: Insufficient documentation

## 2024-09-02 MED ORDER — GADOBUTROL 1 MMOL/ML IV SOLN
7.0000 mL | Freq: Once | INTRAVENOUS | Status: AC | PRN
  Administered 2024-09-02: 7 mL via INTRAVENOUS

## 2024-09-04 ENCOUNTER — Inpatient Hospital Stay: Attending: Hematology and Oncology | Admitting: Internal Medicine

## 2024-09-04 VITALS — BP 167/86 | HR 74 | Temp 98.1°F | Resp 18 | Ht 66.0 in | Wt 156.7 lb

## 2024-09-04 DIAGNOSIS — E236 Other disorders of pituitary gland: Secondary | ICD-10-CM | POA: Insufficient documentation

## 2024-09-04 DIAGNOSIS — Z171 Estrogen receptor negative status [ER-]: Secondary | ICD-10-CM | POA: Insufficient documentation

## 2024-09-04 DIAGNOSIS — Z1722 Progesterone receptor negative status: Secondary | ICD-10-CM | POA: Diagnosis not present

## 2024-09-04 DIAGNOSIS — Z79891 Long term (current) use of opiate analgesic: Secondary | ICD-10-CM | POA: Diagnosis not present

## 2024-09-04 DIAGNOSIS — Z9012 Acquired absence of left breast and nipple: Secondary | ICD-10-CM | POA: Diagnosis not present

## 2024-09-04 DIAGNOSIS — G9389 Other specified disorders of brain: Secondary | ICD-10-CM

## 2024-09-04 DIAGNOSIS — C50412 Malignant neoplasm of upper-outer quadrant of left female breast: Secondary | ICD-10-CM | POA: Diagnosis present

## 2024-09-04 DIAGNOSIS — G893 Neoplasm related pain (acute) (chronic): Secondary | ICD-10-CM | POA: Diagnosis not present

## 2024-09-04 DIAGNOSIS — C7931 Secondary malignant neoplasm of brain: Secondary | ICD-10-CM | POA: Diagnosis not present

## 2024-09-04 DIAGNOSIS — C7951 Secondary malignant neoplasm of bone: Secondary | ICD-10-CM | POA: Diagnosis present

## 2024-09-04 DIAGNOSIS — Z5112 Encounter for antineoplastic immunotherapy: Secondary | ICD-10-CM | POA: Diagnosis present

## 2024-09-04 NOTE — Progress Notes (Signed)
 [facility] at [facility] [address]  [place], [place] [address] [phone]   Interval Evaluation  Date of Service: 09/04/24 Patient Name: Marcella Charlson Patient MRN: [id number] Patient DOB: [birth year] Provider: Arthea MARLA Manns, MD  Identifying Statement:  Maleiya Pergola is a 78 y.o. 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 Report date is 03/05/2022.  The CustomNext-Cancer+RNAinsight panel offered by [facility] includes sequencing and rearrangement analysis for the following 48 genes:  APC, ATM, AXIN2, BARD1, BMPR1A, BRCA1, BRCA2, BRIP1, CDH1, CDK4, CDKN2A, CHEK2, CTNNA1, DICER1, EGFR, EPCAM, GREM1, HOXB13, KIT, MEN1, MLH1, MSH2, MSH3, MSH6, MUTYH, NBN, NF1, NTHL1, PALB2, PDGFRA, PMS2, POLD1, POLE, PTEN, RAD50, RAD51C, RAD51D, SDHA, SDHB, SDHC, SDHD, SMAD4, SMARCA4, STK11, TP53, TSC1, TSC2, and VHL.  RNA data is routinely analyzed for use in variant interpretation for all genes.   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, LCIS, 5/5 lymph nodes positive with extranodal extension, margins negative ER 0%, PR 0%, HER2 3+, Ki-67 20%   04/27/2022 Cancer Staging   Staging form:  Breast, AJCC 8th Edition - Pathologic: Stage IIIA (pT2, pN2, cM0, G2, ER-, PR-, HER2+) - Signed by Odean Potts, MD on 04/27/2022 Histologic grading system: 3 grade system   05/05/2022 - 05/26/2022 Chemotherapy   Patient is on Treatment Plan : BREAST DOCEtaxel  + Trastuzumab  + Pertuzumab  (THP) q21d x 8 cycles / Trastuzumab  + Pertuzumab  q21d x 4 cycles     05/26/2022 -  Chemotherapy   Patient is on Treatment Plan : BREAST MAINTENANCE Trastuzumab  IV (6) or SQ (600) D1 q21d X 11 Cycles     06/12/2023 Cancer Staging   Staging form: Breast, AJCC 8th Edition - Pathologic: Stage IV (cM1) - Signed by Crawford Morna Pickle, NP on 06/12/2023     Interval History: Melat Judah Chevere presents today for follow up after recent MRI brain.  She denies new or progressive neurologic deficits.  Remain independent with gait.  No seizures or headaches.  Continues on herceptin  with Dr. Odean.  H+P (09/11/23) Patient presents today to review recent MRI brain findings.  She underwent MRI study in October given feelings of imbalance which started soon after beginning treatment for breast cancer.  This is sporadic, does not limit her ability to walk for the most part.  No falls.  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  No new or progressive complaints.  Medications: Current Outpatient Medications on File Prior to Visit  Medication Sig Dispense Refill   buPROPion  (WELLBUTRIN  XL) 300 MG 24 hr tablet Take 300 mg by mouth in the morning.     clonazePAM  (KLONOPIN ) 1 MG tablet Take 1 mg by mouth at bedtime.  diphenhydrAMINE  (BENADRYL ) 25 MG tablet Take 25 mg by mouth in the morning and at bedtime.     losartan  (COZAAR ) 50 MG tablet Take 1 tablet (50 mg total) by mouth daily. 90 tablet 3   oxyCODONE  (OXY IR/ROXICODONE ) 5 MG immediate release tablet Take 0.5 tablets (2.5 mg total) by mouth every 6 (six) hours as needed for severe pain (pain score 7-10). 30 tablet 0    senna (SENOKOT) 8.6 MG TABS tablet Take 1 tablet (8.6 mg total) by mouth 3 (three) times daily. 120 tablet 3   tiZANidine  (ZANAFLEX ) 4 MG capsule TAKE 1 CAPSULE(4 MG) BY MOUTH AT BEDTIME 30 capsule 1   tiZANidine  (ZANAFLEX ) 4 MG capsule Take 1 capsule (4 mg total) by mouth 2 (two) times daily as needed for muscle spasms. 30 capsule 1   traZODone  (DESYREL ) 150 MG tablet Take 225 mg by mouth at bedtime.     venlafaxine  XR (EFFEXOR -XR) 75 MG 24 hr capsule Take 75 mg by mouth daily with breakfast.     [DISCONTINUED] prochlorperazine  (COMPAZINE ) 10 MG tablet Take 1 tablet (10 mg total) by mouth every 6 (six) hours as needed (Nausea or vomiting). (Patient not taking: Reported on 05/12/2022) 30 tablet 1   No current facility-administered medications on file prior to visit.    Allergies:  Allergies  Allergen Reactions   Oxybutynin Other (See Comments)    Unknown per Pt    Prozac [Fluoxetine Hcl] Other (See Comments)    Could not lift head up or get out of bed    Rosuvastatin Other (See Comments)    Unknown per Pt    Lyrica [Pregabalin] Anxiety and Other (See Comments)    Felt weird   Past Medical History:  Past Medical History:  Diagnosis Date   Anxiety    C. difficile diarrhea    Depression    GERD (gastroesophageal reflux disease) OCCASIONALLY  TAKE TUMS   Human papilloma virus    Hyperlipidemia    Hypertension    left breast ca 12/2021   UC (ulcerative colitis) (HCC)    UTI (urinary tract infection)    Vulvar lesion    Past Surgical History:  Past Surgical History:  Procedure Laterality Date   ABDOMINAL HYSTERECTOMY  1992   W/ SALPINGO-OOPHORECTOMY   ANTERIOR CERVICAL DECOMP/DISCECTOMY FUSION  01-18-2009  DR POOLE   C4  - C6   AUGMENTATION MAMMAPLASTY Bilateral    BREAST BIOPSY Right    BREAST BIOPSY Left 02/10/2022   x2   BREAST ENHANCEMENT SURGERY  2011   BREAST EXCISIONAL BIOPSY Left    BREAST RECONSTRUCTION WITH PLACEMENT OF TISSUE EXPANDER AND FLEX HD (ACELLULAR  HYDRATED DERMIS) Left 04/07/2022   Procedure: BREAST RECONSTRUCTION WITH PLACEMENT OF TISSUE EXPANDER AND FLEX HD (ACELLULAR HYDRATED DERMIS);  Surgeon: Elisabeth Craig RAMAN, MD;  Location: [facility] OR;  Service: Plastics;  Laterality: Left;   BREAST SURGERY  02-04-2011  dr merrilyn   EXCISION LEFT BREAST MASS--  CALCIFICATION   EYE SURGERY Bilateral    cataract removal   IR CV LINE INJECTION  07/18/2024   IR IMAGING GUIDED PORT INSERTION  08/04/2024   MASTECTOMY W/ SENTINEL NODE BIOPSY Left 04/07/2022   Procedure: LEFT MASTECTOMY WITH SENTINEL NODE BIOPSY;  Surgeon: Vernetta Berg, MD;  Location[facility] OR;  Service: General;  Laterality: Left;  LMA   PORTACATH PLACEMENT N/A 04/07/2022   Procedure: PORT-A-CATH INSERTION WITH ULTRASOUND GUIDANCE;  Surgeon: Vernetta Berg, MD;  Location: [facility] OR;  Service: General;  Laterality: N/A;  REMOVAL OF TISSUE EXPANDER AND PLACEMENT OF IMPLANT Left 03/16/2023   Procedure: Removal of left breast tissue expander and placement of permanent implant;  Surgeon: Waddell Leonce NOVAK, MD;  Location: [facility];  Service: Plastics;  Laterality: Left;   VULVAR LESION REMOVAL  09/10/2012   Procedure: VULVAR LESION;  Surgeon: Carlin LELON Forbes, MD;  Location: [facility];  Service: Gynecology;  Laterality: N/A;  WIDE EXCISION OF VULVAR LESION   Social History:  Social History   Socioeconomic History   Marital status: Married    Spouse name: Lynwood   Number of children: 1   Years of education: 12   Highest education level: Not on file  Occupational History   Occupation: retired  Tobacco Use   Smoking status: Never    Passive exposure: Never   Smokeless tobacco: Never  Vaping Use   Vaping status: Never Used  Substance and Sexual Activity   Alcohol use: Yes    Alcohol/week: 7.0 standard drinks of alcohol    Types: 7 Cans of beer per week   Drug use: Never   Sexual activity: Not Currently  Other Topics Concern   Not on file  Social History  Narrative   Graduated HS      Right handed      Lives with husband   Social Drivers of Health   Financial Resource Strain: Low Risk  (02/22/2022)   Overall Financial Resource Strain (CARDIA)    Difficulty of Paying Living Expenses: Not hard at all  Food Insecurity: No Food Insecurity (11/25/2023)   Hunger Vital Sign    Worried About Running Out of Food in the Last Year: Never true    Ran Out of Food in the Last Year: Never true  Transportation Needs: No Transportation Needs (11/25/2023)   PRAPARE - Administrator, Civil Service (Medical): No    Lack of Transportation (Non-Medical): No  Physical Activity: Not on file  Stress: Not on file  Social Connections: Patient Declined (11/25/2023)   Social Connection and Isolation Panel    Frequency of Communication with Friends and Family: Patient declined    Frequency of Social Gatherings with Friends and Family: Patient declined    Attends Religious Services: Patient declined    Database Administrator or Organizations: Patient declined    Attends Banker Meetings: Patient declined    Marital Status: Patient declined  Intimate Partner Violence: Not At Risk (11/25/2023)   Humiliation, Afraid, Rape, and Kick questionnaire    Fear of Current or Ex-Partner: No    Emotionally Abused: No    Physically Abused: No    Sexually Abused: No   Family History:  Family History  Problem Relation Age of Onset   Lung cancer Mother 66       she smoked   Prostate cancer Brother 28   Breast cancer Cousin        paternal first cousin   Breast cancer Cousin        paternal first cousin   Liver disease Neg Hx    Esophageal cancer Neg Hx    Colon cancer Neg Hx     Review of Systems: Constitutional: Doesn't report fevers, chills or abnormal weight loss Eyes: Doesn't report blurriness of vision Ears, nose, mouth, throat, and face: Doesn't report sore throat Respiratory: Doesn't report cough, dyspnea or wheezes Cardiovascular:  Doesn't report palpitation, chest discomfort  Gastrointestinal:  Doesn't report nausea, constipation, diarrhea GU: Doesn't report incontinence Skin:  Doesn't report skin rashes Neurological: Per HPI Musculoskeletal: Doesn't report joint pain Behavioral/Psych: Doesn't report anxiety  Physical Exam: Vitals:   09/04/24 0934  BP: (!) 167/86  Pulse: 74  Resp: 18  Temp: 98.1 F (36.7 C)  SpO2: 98%    KPS: 90. General: Alert, cooperative, pleasant, in no acute distress Head: Normal EENT: No conjunctival injection or scleral icterus.  Lungs: Resp effort normal Cardiac: Regular rate Abdomen: Non-distended abdomen Skin: No rashes cyanosis or petechiae. Extremities: No clubbing or edema  Neurologic Exam: Mental Status: Awake, alert, attentive to examiner. Oriented to self and environment. Language is fluent with intact comprehension.  Cranial Nerves: Visual acuity is grossly normal. Visual fields are full. Extra-ocular movements intact. No ptosis. Face is symmetric Motor: Tone and bulk are normal. Power is full in both arms and legs. Reflexes are symmetric, no pathologic reflexes present.  Sensory: Intact to light touch Gait: Normal.   Labs: I have reviewed the data as listed    Component Value Date/Time   NA 134 (L) 07/11/2024 1254   K 3.8 07/11/2024 1254   CL 98 07/11/2024 1254   CO2 26 07/11/2024 1254   GLUCOSE 114 (H) 07/11/2024 1254   BUN 12 07/11/2024 1254   CREATININE 1.47 (H) 07/11/2024 1254   CREATININE 1.59 (H) 04/22/2024 0931   CALCIUM 9.7 07/11/2024 1254   PROT 6.8 07/11/2024 1254   ALBUMIN 4.2 07/11/2024 1254   AST 21 07/11/2024 1254   AST 19 04/22/2024 0931   ALT 17 07/11/2024 1254   ALT 16 04/22/2024 0931   ALKPHOS 87 07/11/2024 1254   BILITOT 0.3 07/11/2024 1254   BILITOT 0.4 04/22/2024 0931   GFRNONAA 36 (L) 07/11/2024 1254   GFRNONAA 33 (L) 04/22/2024 0931   GFRAA  01/13/2009 1007    >60        The eGFR has been calculated using the MDRD  equation. This calculation has not been validated in all clinical situations. eGFR's persistently <60 mL/min signify possible Chronic Kidney Disease.   Lab Results  Component Value Date   WBC 6.4 07/11/2024   NEUTROABS 3.8 04/22/2024   HGB 11.2 (L) 07/11/2024   HCT 36.3 07/11/2024   MCV 87.1 07/11/2024   PLT 294 07/11/2024    Imaging:  MR BRAIN W WO CONTRAST Result Date: 09/02/2024 EXAM: MRI BRAIN WITHOUT AND WITH CONTRAST 09/02/2024 04:12:56 PM TECHNIQUE: Multiplanar multisequence MRI of the head/brain was performed without and with the administration of intravenous contrast. CONTRAST: 7 mL of Gadobutrol  (GADAVIST ) 1 MMOL/ML injection 7 mL GADOBUTROL  1 MMOL/ML IV SOLN. COMPARISON: MR Head 09/06/2023. CLINICAL HISTORY: Brain/CNS neoplasm, assess treatment response; pituitary adenoma. History of breast cancer. FINDINGS: BRAIN AND VENTRICLES: No acute infarct, midline shift, extra axial fluid collection, or hydrocephalus is evident. A chronic microhemorrhage in the left external capsule region is unchanged. Patchy T2 hyperintensities in the cerebral white matter are unchanged and nonspecific but compatible with moderate chronic small vessel ischemic disease. Chronic lacunar infarcts are noted in the basal ganglia regions bilaterally. There is mild to moderate cerebral atrophy. Major intracranial vascular flow voids are preserved. There is a 7 mm solidly enhancing lesion in the parasagittal left frontal lobe with a subtle punctate focus of enhancement present on the prior study (series 25 image 133). There is no associated edema. Dedicated imaging was performed through the sella turcica. A 7 x 6 x 6 mm hypoenhancing lesion in the left aspect of the pituitary gland is unchanged. There is no evidence of cavernous sinus  invasion. The pituitary infundibulum remains midline. The optic chiasm is unremarkable. ORBITS: Bilateral cataract extraction. SINUSES: No acute abnormality. BONES AND SOFT TISSUES:  Normal bone marrow signal and enhancement. No acute soft tissue abnormality. IMPRESSION: 1. 7 mm enhancing lesion in the left frontal lobe suspicious for a metastasis. No edema. 2. Unchanged 7 mm pituitary lesion, favor adenoma over a metastasis. Electronically signed by: Dasie Hamburg MD 09/02/2024 05:05 PM EST RP Workstation: [id number]    [facility] Clinician Interpretation: I have personally reviewed the radiological images as listed.  My interpretation, in the context of the patient's clinical presentation, is progressive disease    Assessment/Plan Pituitary mass  Brain mass - Plan: MR BRAIN W WO CONTRAST  Malignant neoplasm of upper-outer quadrant of left breast in female, estrogen receptor negative (HCC) - Plan: MR BRAIN W WO CONTRAST  Sharika Marc Sivertsen is clinically stable today.  MRI brain demonstrated stability of small pituitary adenoma, but novel enhancing lesion within anterior left frontal lobe.  This could be consistent with CNS metastasis, but lack of mass effect and absence of T2/FLAIR correlate are atypical.  She has been quite stable systemically for the past several years, as well.  Recommended careful imaging surveillance at this time, with repeat MRI brain, this time 3T brain mets protocol, in 1 month.  If lesion progresses we will recommend radiosurgery.  She is agreeable with this.    She will con't to follow with Dr. Gudena in the interim.   We appreciate the opportunity to participate in the care of Early Blackwood Hukill.   We ask that Lasonya Blackwood Mazariego return to clinic in 1 months following next brain MRI, or sooner as needed.  All questions were answered. The patient knows to call the clinic with any problems, questions or concerns. No barriers to learning were detected.  The total time spent in the encounter was 40 minutes and more than 50% was on counseling and review of test results   Arthea MARLA Manns, MD Medical Director of Neuro-Oncology [facility] at [facility] 09/04/24 9:23 AM

## 2024-09-05 ENCOUNTER — Telehealth: Payer: Self-pay | Admitting: Internal Medicine

## 2024-09-05 ENCOUNTER — Other Ambulatory Visit: Payer: Self-pay

## 2024-09-05 NOTE — Telephone Encounter (Signed)
 Scheduled patient for next appointment. Called and spoke with the patient, she is aware of the day and time.

## 2024-09-07 ENCOUNTER — Other Ambulatory Visit: Payer: Self-pay

## 2024-09-15 ENCOUNTER — Inpatient Hospital Stay

## 2024-09-15 ENCOUNTER — Inpatient Hospital Stay: Admitting: Hematology and Oncology

## 2024-09-15 VITALS — BP 155/78 | HR 80 | Temp 98.7°F | Resp 18 | Ht 66.0 in | Wt 153.7 lb

## 2024-09-15 DIAGNOSIS — C50412 Malignant neoplasm of upper-outer quadrant of left female breast: Secondary | ICD-10-CM

## 2024-09-15 DIAGNOSIS — Z171 Estrogen receptor negative status [ER-]: Secondary | ICD-10-CM | POA: Diagnosis not present

## 2024-09-15 DIAGNOSIS — Z5112 Encounter for antineoplastic immunotherapy: Secondary | ICD-10-CM | POA: Diagnosis not present

## 2024-09-15 LAB — CMP (CANCER CENTER ONLY)
ALT: 34 U/L (ref 0–44)
AST: 35 U/L (ref 15–41)
Albumin: 4.4 g/dL (ref 3.5–5.0)
Alkaline Phosphatase: 89 U/L (ref 38–126)
Anion gap: 11 (ref 5–15)
BUN: 11 mg/dL (ref 8–23)
CO2: 24 mmol/L (ref 22–32)
Calcium: 9.3 mg/dL (ref 8.9–10.3)
Chloride: 99 mmol/L (ref 98–111)
Creatinine: 1.5 mg/dL — ABNORMAL HIGH (ref 0.44–1.00)
GFR, Estimated: 35 mL/min — ABNORMAL LOW (ref >60)
Glucose, Bld: 100 mg/dL — ABNORMAL HIGH (ref 70–99)
Potassium: 4.2 mmol/L (ref 3.5–5.1)
Sodium: 133 mmol/L — ABNORMAL LOW (ref 135–145)
Total Bilirubin: 0.6 mg/dL (ref 0.0–1.2)
Total Protein: 6.9 g/dL (ref 6.5–8.1)

## 2024-09-15 LAB — CBC WITH DIFFERENTIAL (CANCER CENTER ONLY)
Abs Immature Granulocytes: 0.01 K/uL (ref 0.00–0.07)
Basophils Absolute: 0.1 K/uL (ref 0.0–0.1)
Basophils Relative: 1 %
Eosinophils Absolute: 0.1 K/uL (ref 0.0–0.5)
Eosinophils Relative: 2 %
HCT: 35 % — ABNORMAL LOW (ref 36.0–46.0)
Hemoglobin: 11.7 g/dL — ABNORMAL LOW (ref 12.0–15.0)
Immature Granulocytes: 0 %
Lymphocytes Relative: 14 %
Lymphs Abs: 0.9 K/uL (ref 0.7–4.0)
MCH: 27.5 pg (ref 26.0–34.0)
MCHC: 33.4 g/dL (ref 30.0–36.0)
MCV: 82.2 fL (ref 80.0–100.0)
Monocytes Absolute: 0.5 K/uL (ref 0.1–1.0)
Monocytes Relative: 8 %
Neutro Abs: 4.9 K/uL (ref 1.7–7.7)
Neutrophils Relative %: 75 %
Platelet Count: 303 K/uL (ref 150–400)
RBC: 4.26 MIL/uL (ref 3.87–5.11)
RDW: 12.8 % (ref 11.5–15.5)
WBC Count: 6.5 K/uL (ref 4.0–10.5)
nRBC: 0 % (ref 0.0–0.2)

## 2024-09-15 MED ORDER — SODIUM CHLORIDE 0.9 % IV SOLN: Freq: Once | INTRAVENOUS | Status: AC

## 2024-09-15 MED ORDER — DIPHENHYDRAMINE HCL 25 MG PO CAPS
25.0000 mg | ORAL_CAPSULE | Freq: Once | ORAL | Status: AC
  Administered 2024-09-15: 15:00:00 25 mg via ORAL
  Filled 2024-09-15: qty 1

## 2024-09-15 MED ORDER — ACETAMINOPHEN 325 MG PO TABS
650.0000 mg | ORAL_TABLET | Freq: Once | ORAL | Status: AC
  Administered 2024-09-15: 15:00:00 650 mg via ORAL
  Filled 2024-09-15: qty 2

## 2024-09-15 MED ORDER — TRASTUZUMAB-DKST CHEMO 150 MG IV SOLR
6.0000 mg/kg | Freq: Once | INTRAVENOUS | Status: AC
  Administered 2024-09-15: 16:00:00 420 mg via INTRAVENOUS
  Filled 2024-09-15: qty 20

## 2024-09-15 NOTE — Assessment & Plan Note (Signed)
° °  04/07/2022:Left mastectomy: 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  T4 vertebral body   Treatment: Palliative chemotherapy with Taxotere  [name]  and Perjeta  every 3 weeks x1 cycle given 05/05/2022 (discontinued for severe toxicities and hospitalization)   ---------------------------------------------------------------------------------------------------------------------------------------------- Chemotoxicities: Hospitalization 05/11/2022-05/18/2022: Convulsive syncope, chemo induced neutropenia, oral mucositis, chronic CHF, severe diarrhea with dehydration ECHO 06/26/22: EF 55-60% (follows with Bensimhon) Hospitalization 11/18/2023-11/23/2023: COVID-19, hyponatremia and metabolic acidosis   Treatment eojw:1/74/76 [name]  maintenance.  (Added Perjeta  06/21/22-discontinued 06/2023 secondary to colitis) Lumbar spine MRI 03/07/2024: New 2.5 cm L5 transverse process suspicious for metastases.  Versus degenerative change.  L3 Schmorl's node enlarged from 09/17/2023 which could be the cause of the back pain.   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 Buckley.  Brain MRI 09/20/2023: 2.6 x 7.5 mm mass of the pituitary: Stable   05/15/2024: MRI right shoulder: Indeterminate intramedullary lesion proximal humeral diaphysis 06/04/2024: Bone scan: No activity in the right humerus to correspond to the MRI findings.  New intense uptake right eighth rib and mid lumbar spine (indeterminate) 09/02/2024: MRI brain: Left frontal lobe 7 mm mass, unchanged 7 mm pituitary lesion (1 month brain MRI ordered by Dr. Buckley)   Patient is seeing orthopedics.  It is suspected that the shoulder pain is  related to her prior neck surgery. The rib lesion is felt to be related to a rib fracture.  The lumbar spine lesion is still a concern but we will plan to obtain a PET CT scan in 3 months.   Plan: Continue with [name]  infusion.

## 2024-09-15 NOTE — Progress Notes (Signed)
 Patient Care Team: Loreli Kins, MD as PCP - General (Family Medicine) Georjean Darice HERO, MD as Consulting Physician (Neurology) Tyree Nanetta SAILOR, RN as Oncology Nurse Navigator Vernetta Berg, MD as Consulting Physician (General Surgery) Odean Potts, MD as Consulting Physician (Hematology and Oncology) Izell Domino, MD as Attending Physician (Radiation Oncology)  DIAGNOSIS:  Encounter Diagnosis  Name Primary?   Malignant neoplasm of upper-outer quadrant of left breast in female, estrogen receptor negative (HCC) Yes    SUMMARY OF ONCOLOGIC HISTORY: Oncology History  Malignant neoplasm of upper-outer quadrant of left breast in female, estrogen receptor negative (HCC)  02/10/2022 Initial Diagnosis   Palpable left breast masses and calcifications: 1.8 cm at 1:00 and 1.7 cm at 9:00, calcifications not biopsy.  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 Report date is 03/05/2022.  The CustomNext-Cancer+RNAinsight panel offered by [facility] includes sequencing and rearrangement analysis for the following 48 genes:  APC, ATM, AXIN2, BARD1, BMPR1A, BRCA1, BRCA2, BRIP1, CDH1, CDK4, CDKN2A, CHEK2, CTNNA1, DICER1, EGFR, EPCAM, GREM1, HOXB13, KIT, MEN1, MLH1, MSH2, MSH3, MSH6, MUTYH, NBN, NF1, NTHL1, PALB2, PDGFRA, PMS2, POLD1, POLE, PTEN, RAD50, RAD51C, RAD51D, SDHA, SDHB, SDHC, SDHD, SMAD4, SMARCA4, STK11, TP53, TSC1, TSC2, and VHL.  RNA data is routinely analyzed for use in variant interpretation for all genes.   04/07/2022 Surgery   Left mastectomy: 4.2 cm invasive pleomorphic lobular  carcinoma grade 2, LCIS, 5/5 lymph nodes positive with extranodal extension, margins negative ER 0%, PR 0%, HER2 3+, Ki-67 20%   04/27/2022 Cancer Staging   Staging form: Breast, AJCC 8th Edition - Pathologic: Stage IIIA (pT2, pN2, cM0, G2, ER-, PR-, HER2+) - Signed by Odean Potts, MD on 04/27/2022 Histologic grading system: 3 grade system   05/05/2022 - 05/26/2022 Chemotherapy   Patient is on Treatment Plan : BREAST DOCEtaxel  + Trastuzumab  + Pertuzumab  (THP) q21d x 8 cycles / Trastuzumab  + Pertuzumab  q21d x 4 cycles     05/26/2022 -  Chemotherapy   Patient is on Treatment Plan : BREAST MAINTENANCE Trastuzumab  IV (6) or SQ (600) D1 q21d X 11 Cycles     06/12/2023 Cancer Staging   Staging form: Breast, AJCC 8th Edition - Pathologic: Stage IV (cM1) - Signed by Crawford Morna Pickle, NP on 06/12/2023     CHIEF COMPLIANT: Follow-up on [name]   HISTORY OF PRESENT ILLNESS: History of Present Illness Allison Pierce is a 78 year old female with metastatic HER2-positive, 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  She receives trastuzumab  infusions every five weeks for HER2-positive breast cancer. She is aware of a left frontal brain lesion under surveillance and understands trastuzumab  is intended for systemic control. She completed a bone scan in September. She is not on anti-estrogen therapy as her tumor is ER/PR negative.  She denies new neurological symptoms. She notes mild chronic balance difficulty with occasional stumbling and describes her balance as poor but unchanged.  She has persistent daytime rhinorrhea that resolves when supine at night. Fexofenadine provides minimal benefit, and she sometimes takes it  more than once daily. She avoids nasal sprays due to prior irritation and uses petrolatum  to protect the mucosa. She denies sneezing,  congestion, or other allergic symptoms.  She reports emotional distress related to metastatic cancer and is concerned about the possibility of stopping trastuzumab . She prefers to continue trastuzumab  as long as it is effective.      ALLERGIES:  is allergic to oxybutynin, prozac [fluoxetine hcl], rosuvastatin, and lyrica [pregabalin].  MEDICATIONS:  Current Outpatient Medications  Medication Sig Dispense Refill   buPROPion  (WELLBUTRIN  XL) 300 MG 24 hr tablet Take 300 mg by mouth in the morning.     clonazePAM  (KLONOPIN ) 1 MG tablet Take 1 mg by mouth at bedtime.     diphenhydrAMINE  (BENADRYL ) 25 MG tablet Take 25 mg by mouth in the morning and at bedtime.     losartan  (COZAAR ) 50 MG tablet Take 1 tablet (50 mg total) by mouth daily. 90 tablet 3   oxyCODONE  (OXY IR/ROXICODONE ) 5 MG immediate release tablet Take 0.5 tablets (2.5 mg total) by mouth every 6 (six) hours as needed for severe pain (pain score 7-10). 30 tablet 0   senna (SENOKOT) 8.6 MG TABS tablet Take 1 tablet (8.6 mg total) by mouth 3 (three) times daily. 120 tablet 3   tiZANidine  (ZANAFLEX ) 4 MG capsule TAKE 1 CAPSULE(4 MG) BY MOUTH AT BEDTIME 30 capsule 1   tiZANidine  (ZANAFLEX ) 4 MG capsule Take 1 capsule (4 mg total) by mouth 2 (two) times daily as needed for muscle spasms. 30 capsule 1   traZODone  (DESYREL ) 150 MG tablet Take 225 mg by mouth at bedtime.     venlafaxine  XR (EFFEXOR -XR) 75 MG 24 hr capsule Take 75 mg by mouth daily with breakfast.     No current facility-administered medications for this visit.   Facility-Administered Medications Ordered in Other Visits  Medication Dose Route Frequency Provider Last Rate Last Admin   0.9 %  sodium chloride  infusion   Intravenous Once Teneil Shiller, MD       acetaminophen  (TYLENOL ) tablet 650 mg  650 mg Oral Once Alta Goding, MD       diphenhydrAMINE  (BENADRYL ) capsule 25 mg  25 mg Oral Once Smiley Birr, MD       trastuzumab -dkst (OGIVRI ) 420 mg in sodium chloride  0.9  % 250 mL chemo infusion  6 mg/kg (Treatment Plan Recorded) Intravenous Once Odean Potts, MD        PHYSICAL EXAMINATION: ECOG PERFORMANCE STATUS: 1 - Symptomatic but completely ambulatory  Vitals:   09/15/24 1436 09/15/24 1439  BP: (!) 157/76 (!) 155/78  Pulse: 80   Resp: 18   Temp: 98.7 F (37.1 C)   SpO2: 100%    Filed Weights   09/15/24 1436  Weight: 153 lb 11.2 oz (69.7 kg)    LABORATORY DATA:  I have reviewed the data as listed    Latest Ref Rng & Units 07/11/2024   12:54 PM 04/22/2024    9:31 AM 03/31/2024    4:18 PM  CMP  Glucose 70 - 99 mg/dL 885  878  865   BUN 8 - 23 mg/dL 12  15  22    Creatinine 0.44 - 1.00 mg/dL 8.52  8.40  8.33   Sodium 135 - 145 mmol/L 134  134  133   Potassium 3.5 - 5.1 mmol/L 3.8  4.1  4.5   Chloride 98 - 111 mmol/L 98  101  101   CO2 22 - 32 mmol/L 26  26  27  Calcium 8.9 - 10.3 mg/dL 9.7  8.8  8.8   Total Protein 6.5 - 8.1 g/dL 6.8  6.4  6.2   Total Bilirubin 0.0 - 1.2 mg/dL 0.3  0.4  0.4   Alkaline Phos 38 - 126 U/L 87  81  81   AST 15 - 41 U/L 21  19  20    ALT 0 - 44 U/L 17  16  17      Lab Results  Component Value Date   WBC 6.4 07/11/2024   HGB 11.2 (L) 07/11/2024   HCT 36.3 07/11/2024   MCV 87.1 07/11/2024   PLT 294 07/11/2024   NEUTROABS 3.8 04/22/2024    ASSESSMENT & PLAN:  Malignant neoplasm of upper-outer quadrant of left breast in female, estrogen receptor negative (HCC)   04/07/2022:Left mastectomy: 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  T4 vertebral body   Treatment: Palliative chemotherapy with Taxotere  [name]  and Perjeta  every 3 weeks x1 cycle given 05/05/2022 (discontinued for severe toxicities and hospitalization)    ---------------------------------------------------------------------------------------------------------------------------------------------- Chemotoxicities: Hospitalization 05/11/2022-05/18/2022: Convulsive syncope, chemo induced neutropenia, oral mucositis, chronic CHF, severe diarrhea with dehydration ECHO 06/26/22: EF 55-60% (follows with Bensimhon) Hospitalization 11/18/2023-11/23/2023: COVID-19, hyponatremia and metabolic acidosis   Treatment eojw:1/74/76 [name]  maintenance.  (Added Perjeta  06/21/22-discontinued 06/2023 secondary to colitis) Lumbar spine MRI 03/07/2024: New 2.5 cm L5 transverse process suspicious for metastases.  Versus degenerative change.  L3 Schmorl's node enlarged from 09/17/2023 which could be the cause of the back pain.   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 Buckley.  Brain MRI 09/20/2023: 2.6 x 7.5 mm mass of the pituitary: Stable   05/15/2024: MRI right shoulder: Indeterminate intramedullary lesion proximal humeral diaphysis 06/04/2024: Bone scan: No activity in the right humerus to correspond to the MRI findings.  New intense uptake right eighth rib and mid lumbar spine (indeterminate) 09/02/2024: MRI brain: Left frontal lobe 7 mm mass, unchanged 7 mm pituitary lesion (1 month brain MRI ordered by Dr. Buckley)   Patient is seeing orthopedics.  It is suspected that the shoulder pain is related to her prior neck surgery. The rib lesion is felt to be related to a rib fracture.  The lumbar spine lesion is still a concern but we will plan to obtain a PET CT scan in 3 months.   Plan: Continue with [name]  infusion every 5 weeks Plan for CT scans and bone scans in 3 months.  At that time we might consider switching [name]  infusions to every 6 weeks. 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 CNS involvement includes a small left frontal brain lesion and pituitary lesion under surveillance. [name]  continued for systemic control despite limited CNS penetration. - Continued trastuzumab  infusions every five weeks as tolerated. - Discussed possible extension to every six weeks after next imaging if disease remains controlled. - Ordered MRI brain for January 13th to assess CNS disease, including pituitary lesion. - Ordered CT of whole body and bone scan in three months to evaluate for systemic and osseous progression. - Provided education regarding ongoing [name]  therapy and HER2/ER status.  Allergic rhinitis Persistent rhinorrhea with limited benefit from Allegra  and nasal burning from Vicks. Mild nasal/skin irritation may be related to [name] . - Advised use of Vaseline for nasal moisture in place of Vicks. - Discussed switching from Allegra to Claritin as alternative antihistamine. - Advised against nasal sprays such as Afrin due to risk of mucosal damage.      No orders of the defined types were placed in this encounter.  The patient has a good understanding of the overall plan. she agrees with it. she will call with any problems that may develop before the next visit here.  I personally spent a total of 30 minutes in the care of the patient today including preparing to see the patient, getting/reviewing separately obtained history, performing a medically appropriate exam/evaluation, counseling and educating, placing orders, referring and communicating with other health care professionals, documenting clinical information in the EHR, independently interpreting results, communicating results, and coordinating care.   Viinay K Sherryn Pollino, MD 09/15/2024

## 2024-09-15 NOTE — Patient Instructions (Signed)
 [facility] - A DEPT OF [facility]  Discharge Instructions: Thank you for choosing [facility] to provide your oncology and hematology care.   If you have a lab appointment with the Cancer Center, please go directly to the Cancer Center and check in at the registration area.   Wear comfortable clothing and clothing appropriate for easy access to any Portacath or PICC line.   We strive to give you quality time with your provider. You may need to reschedule your appointment if you arrive late (15 or more minutes).  Arriving late affects you and other patients whose appointments are after yours.  Also, if you miss three or more appointments without notifying the office, you may be dismissed from the clinic at the provider's discretion.      For prescription refill requests, have your pharmacy contact our office and allow 72 hours for refills to be completed.    Today you received the following chemotherapy and/or immunotherapy agents: Ogivri      To help prevent nausea and vomiting after your treatment, we encourage you to take your nausea medication as directed.  BELOW ARE SYMPTOMS THAT SHOULD BE REPORTED IMMEDIATELY: *FEVER GREATER THAN 100.4 F (38 C) OR HIGHER *CHILLS OR SWEATING *NAUSEA AND VOMITING THAT IS NOT CONTROLLED WITH YOUR NAUSEA MEDICATION *UNUSUAL SHORTNESS OF BREATH *UNUSUAL BRUISING OR BLEEDING *URINARY PROBLEMS (pain or burning when urinating, or frequent urination) *BOWEL PROBLEMS (unusual diarrhea, constipation, pain near the anus) TENDERNESS IN MOUTH AND THROAT WITH OR WITHOUT PRESENCE OF ULCERS (sore throat, sores in mouth, or a toothache) UNUSUAL RASH, SWELLING OR PAIN  UNUSUAL VAGINAL DISCHARGE OR ITCHING   Items with * indicate a potential emergency and should be followed up as soon as possible or go to the Emergency Department if any problems should occur.  Please show the CHEMOTHERAPY ALERT CARD or IMMUNOTHERAPY  ALERT CARD at check-in to the Emergency Department and triage nurse.  Should you have questions after your visit or need to cancel or reschedule your appointment, please contact [facility] - A DEPT OF [facility]  Dept: [phone]  and follow the prompts.  Office hours are 8:00 a.m. to 4:30 p.m. Monday - Friday. Please note that voicemails left after 4:00 p.m. may not be returned until the following business day.  We are closed weekends and major holidays. You have access to a nurse at all times for urgent questions. Please call the main number to the clinic Dept: [phone] and follow the prompts.   For any non-urgent questions, you may also contact your provider using MyChart. We now offer e-Visits for anyone 65 and older to request care online for non-urgent symptoms. For details visit [email or web address].   Also download the MyChart app! Go to the app store, search "MyChart", open the app, select [place], and log in with your MyChart username and password.

## 2024-09-16 ENCOUNTER — Ambulatory Visit: Payer: Self-pay

## 2024-09-16 ENCOUNTER — Ambulatory Visit: Payer: Medicare Other | Admitting: Internal Medicine

## 2024-09-16 ENCOUNTER — Other Ambulatory Visit: Payer: Medicare Other

## 2024-09-17 ENCOUNTER — Encounter: Payer: Self-pay | Admitting: Hematology and Oncology

## 2024-09-17 ENCOUNTER — Other Ambulatory Visit: Payer: Self-pay

## 2024-09-20 ENCOUNTER — Other Ambulatory Visit: Payer: Self-pay

## 2024-10-03 ENCOUNTER — Emergency Department (HOSPITAL_COMMUNITY)
Admission: EM | Admit: 2024-10-03 | Discharge: 2024-10-03 | Disposition: A | Discharge status: Home / Self Care | Payer: PRIVATE HEALTH INSURANCE | Source: Home / Self Care | Attending: Emergency Medicine | Admitting: Emergency Medicine

## 2024-10-03 ENCOUNTER — Emergency Department (HOSPITAL_COMMUNITY): Payer: PRIVATE HEALTH INSURANCE

## 2024-10-03 ENCOUNTER — Other Ambulatory Visit: Payer: Self-pay

## 2024-10-03 DIAGNOSIS — R300 Dysuria: Secondary | ICD-10-CM | POA: Insufficient documentation

## 2024-10-03 DIAGNOSIS — I10 Essential (primary) hypertension: Secondary | ICD-10-CM | POA: Diagnosis not present

## 2024-10-03 DIAGNOSIS — I110 Hypertensive heart disease with heart failure: Secondary | ICD-10-CM | POA: Diagnosis not present

## 2024-10-03 DIAGNOSIS — I509 Heart failure, unspecified: Secondary | ICD-10-CM | POA: Insufficient documentation

## 2024-10-03 DIAGNOSIS — R059 Cough, unspecified: Secondary | ICD-10-CM | POA: Diagnosis present

## 2024-10-03 DIAGNOSIS — U071 COVID-19: Secondary | ICD-10-CM | POA: Insufficient documentation

## 2024-10-03 DIAGNOSIS — Z79899 Other long term (current) drug therapy: Secondary | ICD-10-CM | POA: Insufficient documentation

## 2024-10-03 LAB — BASIC METABOLIC PANEL WITH GFR
Anion gap: 9 (ref 5–15)
BUN: 11 mg/dL (ref 8–23)
CO2: 28 mmol/L (ref 22–32)
Calcium: 8.9 mg/dL (ref 8.9–10.3)
Chloride: 98 mmol/L (ref 98–111)
Creatinine, Ser: 1.46 mg/dL — ABNORMAL HIGH (ref 0.44–1.00)
GFR, Estimated: 36 mL/min — ABNORMAL LOW
Glucose, Bld: 142 mg/dL — ABNORMAL HIGH (ref 70–99)
Potassium: 4.1 mmol/L (ref 3.5–5.1)
Sodium: 135 mmol/L (ref 135–145)

## 2024-10-03 LAB — URINALYSIS, ROUTINE W REFLEX MICROSCOPIC
Bilirubin Urine: NEGATIVE
Glucose, UA: NEGATIVE mg/dL
Hgb urine dipstick: NEGATIVE
Ketones, ur: NEGATIVE mg/dL
Leukocytes,Ua: NEGATIVE
Nitrite: NEGATIVE
Protein, ur: NEGATIVE mg/dL
Specific Gravity, Urine: 1.005 (ref 1.005–1.030)
pH: 7 (ref 5.0–8.0)

## 2024-10-03 LAB — CBC WITH DIFFERENTIAL/PLATELET
Abs Immature Granulocytes: 0.02 K/uL (ref 0.00–0.07)
Basophils Absolute: 0.1 K/uL (ref 0.0–0.1)
Basophils Relative: 1 %
Eosinophils Absolute: 0.2 K/uL (ref 0.0–0.5)
Eosinophils Relative: 4 %
HCT: 34.8 % — ABNORMAL LOW (ref 36.0–46.0)
Hemoglobin: 11.4 g/dL — ABNORMAL LOW (ref 12.0–15.0)
Immature Granulocytes: 0 %
Lymphocytes Relative: 21 %
Lymphs Abs: 1.1 K/uL (ref 0.7–4.0)
MCH: 28.1 pg (ref 26.0–34.0)
MCHC: 32.8 g/dL (ref 30.0–36.0)
MCV: 85.7 fL (ref 80.0–100.0)
Monocytes Absolute: 0.5 K/uL (ref 0.1–1.0)
Monocytes Relative: 11 %
Neutro Abs: 3 K/uL (ref 1.7–7.7)
Neutrophils Relative %: 63 %
Platelets: 269 K/uL (ref 150–400)
RBC: 4.06 MIL/uL (ref 3.87–5.11)
RDW: 13.2 % (ref 11.5–15.5)
WBC: 4.9 K/uL (ref 4.0–10.5)
nRBC: 0 % (ref 0.0–0.2)

## 2024-10-03 LAB — RESP PANEL BY RT-PCR (RSV, FLU A&B, COVID)  RVPGX2
Influenza A by PCR: NEGATIVE
Influenza B by PCR: NEGATIVE
Resp Syncytial Virus by PCR: NEGATIVE
SARS Coronavirus 2 by RT PCR: POSITIVE — AB

## 2024-10-03 MED ORDER — NAPROXEN 250 MG PO TABS: 375.0000 mg | ORAL_TABLET | Freq: Two times a day (BID) | ORAL | 0 refills | Status: AC

## 2024-10-03 NOTE — ED Triage Notes (Signed)
 C/o cough, body aches, fatigue, weakness. Concerned for flu. Breast cancer, actively in treatment.

## 2024-10-03 NOTE — Discharge Instructions (Addendum)
 You were seen today for COVID, painful urination and elevated blood pressure.  Would recommend you continue to follow-up with your PCP for further evaluation of your blood pressure as it was significantly elevated today.  Recommending that you will continue to likely have symptoms present for the next week or 2, however recommending follow-up with PCP for further evaluation as well as reevaluation of your blood pressure as it was significantly elevated today.  Please continue to take your blood pressure medication as prescribed, taking logs of your blood pressure for the next couple of days, following up with your PCP within the next week for reevaluation.   Take Tylenol  (acetominophen)  650mg  every 4-6 hours, as needed for pain or fever. Do not take more than 4,000 mg in a 24-hour period. As this may cause liver damage. While this is rare, if you begin to develop yellowing of the skin or eyes, stop taking and return to ER immediately.   Return to the ED if you have any new or worsening symptoms which include shortness of breath, uncontrollable headache with blurry vision, vomiting, blood in urine or stool.

## 2024-10-03 NOTE — ED Notes (Signed)
 Patient states she did have contact with a person with covid.  Called lab for results that have been pending for 1.5 hours.

## 2024-10-03 NOTE — ED Provider Notes (Signed)
 " [place] EMERGENCY DEPARTMENT AT [facility] Provider Note   CSN: [id number] Arrival date & time: 10/03/24  1251     Patient presents with: Cough   Allison Allison is a 79 y.o. female.  Cough Associated symptoms: chills   Patient is a 79 year old female presenting ED today for concerns for cough, ingestion, generalized bodyaches that have been present x 3 days.  Reported that she went to a party for New Year'Allison Allison, for which people had reportedly been diagnosed with COVID.  Additionally is endorsing mild dysuria.  Notably has history of breast cancer currently undergoing immunotherapy, CHF, HTN, ulcerative colitis, mild cognitive impairment.   Denies fever, headache, vision changes, chest pain, shortness of breath, abdominal pain, nausea, vomiting, diarrhea, melena, hematochezia, hematuria, urinary urgency/frequency, increased lower leg swelling, rashes.     Prior to Admission medications  Medication Sig Start Date End Date Taking? Authorizing Provider  naproxen (NAPROSYN) 250 MG tablet Take 1.5 tablets (375 mg total) by mouth 2 (two) times daily. 10/03/24  Yes Beola Terrall RAMAN, PA-C  buPROPion  (WELLBUTRIN  XL) 300 MG 24 hr tablet Take 300 mg by mouth in the morning.    [provider]  clonazePAM  (KLONOPIN ) 1 MG tablet Take 1 mg by mouth at bedtime.    [provider]  diphenhydrAMINE  (BENADRYL ) 25 MG tablet Take 25 mg by mouth in the morning and at bedtime.    [provider]  losartan  (COZAAR ) 50 MG tablet Take 1 tablet (50 mg total) by mouth daily. 07/17/24   Bensimhon, Toribio SAUNDERS, MD  oxyCODONE  (OXY IR/ROXICODONE ) 5 MG immediate release tablet Take 0.5 tablets (2.5 mg total) by mouth every 6 (six) hours as needed for severe pain (pain score 7-10). 07/07/24   Gudena, Vinay, MD  senna (SENOKOT) 8.6 MG TABS tablet Take 1 tablet (8.6 mg total) by mouth 3 (three) times daily. 04/28/24   Gudena, Vinay, MD  tiZANidine  (ZANAFLEX ) 4 MG capsule TAKE  1 CAPSULE(4 MG) BY MOUTH AT BEDTIME 08/21/24   Magnant, Charles L, PA-C  tiZANidine  (ZANAFLEX ) 4 MG capsule Take 1 capsule (4 mg total) by mouth 2 (two) times daily as needed for muscle spasms. 08/20/24   Vernetta Lonni GRADE, MD  traZODone  (DESYREL ) 150 MG tablet Take 225 mg by mouth at bedtime.    [provider]  venlafaxine  XR (EFFEXOR -XR) 75 MG 24 hr capsule Take 75 mg by mouth daily with breakfast.    [provider]  prochlorperazine  (COMPAZINE ) 10 MG tablet Take 1 tablet (10 mg total) by mouth every 6 (six) hours as needed (Nausea or vomiting). Patient not taking: Reported on 05/12/2022 04/27/22 06/07/22  Gudena, Vinay, MD    Allergies: Oxybutynin, Prozac [fluoxetine hcl], Rosuvastatin, and Lyrica [pregabalin]    Review of Systems  Constitutional:  Positive for chills.  HENT:  Positive for congestion.   Respiratory:  Positive for cough.   All other systems reviewed and are negative.   Updated Vital Signs BP (!) 218/106 (BP Location: Left Arm)   Pulse 71   Temp 97.7 F (36.5 C) (Oral)   Resp 16   SpO2 97%   Physical Exam Vitals and nursing note reviewed.  Constitutional:      General: She is not in acute distress.    Appearance: Normal appearance. She is not ill-appearing or diaphoretic.  HENT:     Head: Normocephalic and atraumatic.     Nose: Congestion present.     Mouth/Throat:     Mouth: Mucous  membranes are moist.     Pharynx: Oropharynx is clear. No oropharyngeal exudate or posterior oropharyngeal erythema.  Eyes:     General: No scleral icterus.       Right eye: No discharge.        Left eye: No discharge.     Extraocular Movements: Extraocular movements intact.     Conjunctiva/sclera: Conjunctivae normal.     Pupils: Pupils are equal, round, and reactive to light.  Cardiovascular:     Rate and Rhythm: Normal rate and regular rhythm.     Pulses: Normal pulses.     Heart sounds: Normal heart sounds. No murmur heard.    No friction rub. No  gallop.  Pulmonary:     Effort: Pulmonary effort is normal. No respiratory distress.     Breath sounds: No stridor. No wheezing, rhonchi or rales.  Chest:     Chest wall: No tenderness.  Abdominal:     General: Abdomen is flat. There is no distension.     Palpations: Abdomen is soft.     Tenderness: There is no abdominal tenderness. There is no right CVA tenderness, left CVA tenderness, guarding or rebound.  Musculoskeletal:        General: No swelling, deformity or signs of injury.     Cervical back: Normal range of motion. No rigidity or tenderness.     Right lower leg: No edema.     Left lower leg: No edema.  Skin:    General: Skin is warm and dry.     Findings: No bruising, erythema or lesion.  Neurological:     General: No focal deficit present.     Mental Status: She is alert and oriented to person, place, and time. Mental status is at baseline.     Sensory: No sensory deficit.     Motor: No weakness.  Psychiatric:        Mood and Affect: Mood normal.     (all labs ordered are listed, but only abnormal results are displayed) Labs Reviewed  RESP PANEL BY RT-PCR (RSV, FLU A&B, COVID)  RVPGX2 - Abnormal; Notable for the following components:      Result Value   SARS Coronavirus 2 by RT PCR POSITIVE (*)    All other components within normal limits  CBC WITH DIFFERENTIAL/PLATELET - Abnormal; Notable for the following components:   Hemoglobin 11.4 (*)    HCT 34.8 (*)    All other components within normal limits  BASIC METABOLIC PANEL WITH GFR - Abnormal; Notable for the following components:   Glucose, Bld 142 (*)    Creatinine, Ser 1.46 (*)    GFR, Estimated 36 (*)    All other components within normal limits  URINALYSIS, ROUTINE W REFLEX MICROSCOPIC - Abnormal; Notable for the following components:   Color, Urine STRAW (*)    All other components within normal limits    EKG: None  Radiology: [facility] Chest Port 1 View Result Date: 10/03/2024 EXAM: 1 VIEW(Allison) XRAY OF THE  CHEST 10/03/2024 04:43:00 PM COMPARISON: Comparison with 05/13/2024. CLINICAL HISTORY: Persistent cough, shortness of breath, and immunotherapy. Additional history: Cough, body aches, fatigue, and weakness with concern for flu. Currently in treatment for breast cancer. FINDINGS: LINES, TUBES AND DEVICES: Power port type central venous catheter with tip over the cavoatrial junction region. LUNGS AND PLEURA: Lungs are clear. No pleural effusion or pneumothorax. HEART AND MEDIASTINUM: Heart size and pulmonary vascularity are normal. Mediastinal contours appear intact. Calcification of the aorta. BONES AND  SOFT TISSUES: Postoperative changes in the cervical spine. Surgical clips in the left axilla. Right breast implant. IMPRESSION: 1. No acute cardiopulmonary abnormality. 2. Right chest power port catheter tip projects at the cavoatrial junction. Electronically signed by: Elsie Gravely MD 10/03/2024 05:37 PM EST RP Workstation: [id number]    Procedures   Medications Ordered in the ED - No data to display Medical Decision Making Amount and/or Complexity of Data Reviewed Labs: ordered. Radiology: ordered.   This patient is a 79 year old female who presents to the ED for concern of cough, congestion, body aches x 3 days with accompanying dysuria that she reports to be mild.  No he went to a party where everyone was diagnosed with COVID.  On physical exam, patient is in no acute distress, afebrile, alert and orient x 4, speaking in full sentences, nontachypneic, nontachycardic.  LCTAB, RRR, no murmur.  Normal neuroexam.  Unremarkable otherwise.  Patient overall well-appearing.  However with patient'Allison apnea, myositis, lab work and imaging were done which were negative.  Noted to be in hypertensive urgency however currently asymptomatic, will have her continue to follow-up with her PCP taking her home blood pressure medication and monitoring.  Return to the ED for any new or worsening symptoms.  Low  suspicion for meningitis, hypertensive emergency, CVA.  Patient vital signs have remained stable throughout the course of patient'Allison time in the ED. Low suspicion for any other emergent pathology at this time. I believe this patient is safe to be discharged. Provided strict return to ER precautions. Patient expressed agreement and understanding of plan. All questions were answered.  Differential diagnoses prior to evaluation: The emergent differential diagnosis includes, but is not limited to,  upper respiratory infection, lower respiratory infection, allergies, asthma, irritants, sinus/esophageal foreign body, interstitial lung disease, malignancy, pneumonia,. This is not an exhaustive differential.   Past Medical History / Co-morbidities / Social History: Metastatic breast cancer, CHF, HTN, ulcerative colitis, mild cognitive impairment  Additional history: Chart reviewed. Pertinent results include:   Last seen by oncology on 09/15/2024 for breast cancer.  Undergoing immunotherapy.  Last infusion on 09/15/2024.  Lab Tests/Imaging studies: I personally interpreted labs/imaging and the pertinent results include:    Resp panel positive for COVID.  UA unremarkable CBC unremarkable BMP at baseline Chest x-ray unremarkable  I agree with the radiologist interpretation.    Medications: I have reviewed the patients home medicines and have made adjustments as needed.  Critical Interventions: None  Social Determinants of Health: Has good follow-up with PCP  Disposition: After consideration of the diagnostic results and the patients response to treatment, I feel that the patient would benefit from discharge and turn as above.   emergency department workup does not suggest an emergent condition requiring admission or immediate intervention beyond what has been performed at this time. The plan is: Follow-up with PCP, return to the ER for new or worsening symptoms. The patient is safe for  discharge and has been instructed to return immediately for worsening symptoms, change in symptoms or any other concerns.   Final diagnoses:  COVID  Dysuria  Hypertension, unspecified type    ED Discharge Orders          Ordered    naproxen (NAPROSYN) 250 MG tablet  2 times daily        10/03/24 1833               Allison Kowal S, PA-C 10/03/24 1833    Laurice Maude BROCKS, MD 10/03/24 2226  "

## 2024-10-14 ENCOUNTER — Ambulatory Visit
Admission: RE | Admit: 2024-10-14 | Discharge: 2024-10-14 | Disposition: A | Source: Ambulatory Visit | Attending: Internal Medicine

## 2024-10-14 DIAGNOSIS — Z171 Estrogen receptor negative status [ER-]: Secondary | ICD-10-CM

## 2024-10-14 DIAGNOSIS — G9389 Other specified disorders of brain: Secondary | ICD-10-CM

## 2024-10-14 MED ORDER — GADOPICLENOL 0.5 MMOL/ML IV SOLN
6.0000 mL | Freq: Once | INTRAVENOUS | Status: AC | PRN
  Administered 2024-10-14: 6 mL via INTRAVENOUS

## 2024-10-16 ENCOUNTER — Inpatient Hospital Stay: Payer: PRIVATE HEALTH INSURANCE | Attending: Hematology and Oncology | Admitting: Internal Medicine

## 2024-10-16 ENCOUNTER — Other Ambulatory Visit: Payer: Self-pay | Admitting: Radiation Therapy

## 2024-10-16 VITALS — BP 157/82 | HR 81 | Temp 98.1°F | Resp 18 | Wt 155.5 lb

## 2024-10-16 DIAGNOSIS — C7931 Secondary malignant neoplasm of brain: Secondary | ICD-10-CM | POA: Diagnosis not present

## 2024-10-16 DIAGNOSIS — Z1722 Progesterone receptor negative status: Secondary | ICD-10-CM | POA: Insufficient documentation

## 2024-10-16 DIAGNOSIS — Z803 Family history of malignant neoplasm of breast: Secondary | ICD-10-CM | POA: Diagnosis not present

## 2024-10-16 DIAGNOSIS — E236 Other disorders of pituitary gland: Secondary | ICD-10-CM | POA: Diagnosis not present

## 2024-10-16 DIAGNOSIS — Z5112 Encounter for antineoplastic immunotherapy: Secondary | ICD-10-CM | POA: Insufficient documentation

## 2024-10-16 DIAGNOSIS — Z171 Estrogen receptor negative status [ER-]: Secondary | ICD-10-CM | POA: Diagnosis not present

## 2024-10-16 DIAGNOSIS — Z801 Family history of malignant neoplasm of trachea, bronchus and lung: Secondary | ICD-10-CM | POA: Insufficient documentation

## 2024-10-16 DIAGNOSIS — C50412 Malignant neoplasm of upper-outer quadrant of left female breast: Secondary | ICD-10-CM | POA: Insufficient documentation

## 2024-10-16 DIAGNOSIS — C7951 Secondary malignant neoplasm of bone: Secondary | ICD-10-CM | POA: Insufficient documentation

## 2024-10-16 DIAGNOSIS — Z9012 Acquired absence of left breast and nipple: Secondary | ICD-10-CM | POA: Diagnosis not present

## 2024-10-16 NOTE — Progress Notes (Signed)
 "  [facility] at [facility] [address]  [place], [place] [address] [phone]   Interval Evaluation  Date of Service: 10/16/24 Patient Name: Allison Pierce Patient MRN: [id number] Patient DOB: Sep 19, 1946 Provider: Arthea MARLA Manns, MD  Identifying Statement:  Allison Pierce is a 79 y.o. female with Metastasis to brain [facility])   Primary Cancer:  Oncologic History: Oncology History  Malignant neoplasm of upper-outer quadrant of left breast in female, estrogen receptor negative (HCC)  02/10/2022 Initial Diagnosis   Palpable left breast masses and calcifications: 1.8 cm at 1:00 and 1.7 cm at 9:00, calcifications not biopsy.  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 Report date is 03/05/2022.  The CustomNext-Cancer+RNAinsight panel offered by [facility] includes sequencing and rearrangement analysis for the following 48 genes:  APC, ATM, AXIN2, BARD1, BMPR1A, BRCA1, BRCA2, BRIP1, CDH1, CDK4, CDKN2A, CHEK2, CTNNA1, DICER1, EGFR, EPCAM, GREM1, HOXB13, KIT, MEN1, MLH1, MSH2, MSH3, MSH6, MUTYH, NBN, NF1, NTHL1, PALB2, PDGFRA, PMS2, POLD1, POLE, PTEN, RAD50, RAD51C, RAD51D, SDHA, SDHB, SDHC, SDHD, SMAD4, SMARCA4, STK11, TP53, TSC1, TSC2, and VHL.  RNA data is routinely analyzed for use in variant interpretation for all genes.   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, LCIS, 5/5 lymph nodes positive with extranodal extension, margins negative ER 0%, PR 0%, HER2 3+, Ki-67 20%   04/27/2022 Cancer Staging    Staging form: Breast, AJCC 8th Edition - Pathologic: Stage IIIA (pT2, pN2, cM0, G2, ER-, PR-, HER2+) - Signed by Odean Potts, MD on 04/27/2022 Histologic grading system: 3 grade system   05/05/2022 - 05/26/2022 Chemotherapy   Patient is on Treatment Plan : BREAST DOCEtaxel  + Trastuzumab  + Pertuzumab  (THP) q21d x 8 cycles / Trastuzumab  + Pertuzumab  q21d x 4 cycles     05/26/2022 -  Chemotherapy   Patient is on Treatment Plan : BREAST MAINTENANCE Trastuzumab  IV (6) or SQ (600) D1 q21d X 11 Cycles     06/12/2023 Cancer Staging   Staging form: Breast, AJCC 8th Edition - Pathologic: Stage IV (cM1) - Signed by Crawford Morna Pickle, NP on 06/12/2023     Interval History: Allison Pierce presents today for follow up after recent MRI brain.  She denies new or progressive neurologic deficits.  Remain independent with gait, still has some stumbles on occasion.  No seizures or headaches.  Continues on herceptin  with Dr. Gudena.  H+P (09/11/23) Patient presents today to review recent MRI brain findings.  She underwent MRI study in October given feelings of imbalance which started soon after beginning treatment for breast cancer.  This is sporadic, does not limit her ability to walk for the most part.  No falls.  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  No new or progressive complaints.  Medications: Current Outpatient Medications on File Prior to Visit  Medication Sig Dispense Refill   buPROPion  (WELLBUTRIN  XL) 300 MG 24 hr tablet Take 300 mg by mouth in the morning.     clonazePAM  (KLONOPIN ) 1 MG tablet Take 1  mg by mouth at bedtime.     diphenhydrAMINE  (BENADRYL ) 25 MG tablet Take 25 mg by mouth in the morning and at bedtime.     losartan  (COZAAR ) 50 MG tablet Take 1 tablet (50 mg total) by mouth daily. 90 tablet 3   naproxen  (NAPROSYN ) 250 MG tablet Take 1.5 tablets (375 mg total) by mouth 2 (two) times daily. 14 tablet 0   senna (SENOKOT) 8.6 MG  TABS tablet Take 1 tablet (8.6 mg total) by mouth 3 (three) times daily. 120 tablet 3   tiZANidine  (ZANAFLEX ) 4 MG capsule TAKE 1 CAPSULE(4 MG) BY MOUTH AT BEDTIME 30 capsule 1   tiZANidine  (ZANAFLEX ) 4 MG capsule Take 1 capsule (4 mg total) by mouth 2 (two) times daily as needed for muscle spasms. 30 capsule 1   traZODone  (DESYREL ) 150 MG tablet Take 225 mg by mouth at bedtime.     venlafaxine  XR (EFFEXOR -XR) 75 MG 24 hr capsule Take 75 mg by mouth daily with breakfast.     oxyCODONE  (OXY IR/ROXICODONE ) 5 MG immediate release tablet Take 0.5 tablets (2.5 mg total) by mouth every 6 (six) hours as needed for severe pain (pain score 7-10). (Patient not taking: Reported on 10/16/2024) 30 tablet 0   [DISCONTINUED] prochlorperazine  (COMPAZINE ) 10 MG tablet Take 1 tablet (10 mg total) by mouth every 6 (six) hours as needed (Nausea or vomiting). (Patient not taking: Reported on 05/12/2022) 30 tablet 1   No current facility-administered medications on file prior to visit.    Allergies:  Allergies  Allergen Reactions   Oxybutynin Other (See Comments)    Unknown per Pt    Prozac [Fluoxetine Hcl] Other (See Comments)    Could not lift head up or get out of bed    Rosuvastatin Other (See Comments)    Unknown per Pt    Lyrica [Pregabalin] Anxiety and Other (See Comments)    Felt weird   Past Medical History:  Past Medical History:  Diagnosis Date   Anxiety    C. difficile diarrhea    Depression    GERD (gastroesophageal reflux disease) OCCASIONALLY  TAKE TUMS   Human papilloma virus    Hyperlipidemia    Hypertension    left breast ca 12/2021   UC (ulcerative colitis) (HCC)    UTI (urinary tract infection)    Vulvar lesion    Past Surgical History:  Past Surgical History:  Procedure Laterality Date   ABDOMINAL HYSTERECTOMY  1992   W/ SALPINGO-OOPHORECTOMY   ANTERIOR CERVICAL DECOMP/DISCECTOMY FUSION  01-18-2009  DR POOLE   C4  - C6   AUGMENTATION MAMMAPLASTY Bilateral    BREAST  BIOPSY Right    BREAST BIOPSY Left 02/10/2022   x2   BREAST ENHANCEMENT SURGERY  2011   BREAST EXCISIONAL BIOPSY Left    BREAST RECONSTRUCTION WITH PLACEMENT OF TISSUE EXPANDER AND FLEX HD (ACELLULAR HYDRATED DERMIS) Left 04/07/2022   Procedure: BREAST RECONSTRUCTION WITH PLACEMENT OF TISSUE EXPANDER AND FLEX HD (ACELLULAR HYDRATED DERMIS);  Surgeon: Elisabeth Craig RAMAN, MD;  Location: [facility] OR;  Service: Plastics;  Laterality: Left;   BREAST SURGERY  02-04-2011  dr merrilyn   EXCISION LEFT BREAST MASS--  CALCIFICATION   EYE SURGERY Bilateral    cataract removal   IR CV LINE INJECTION  07/18/2024   IR IMAGING GUIDED PORT INSERTION  08/04/2024   MASTECTOMY W/ SENTINEL NODE BIOPSY Left 04/07/2022   Procedure: LEFT MASTECTOMY WITH SENTINEL NODE BIOPSY;  Surgeon: Vernetta Berg, MD;  Location: [facility] OR;  Service: General;  Laterality: Left;  LMA   PORTACATH PLACEMENT N/A 04/07/2022   Procedure: PORT-A-CATH INSERTION WITH ULTRASOUND GUIDANCE;  Surgeon: Vernetta Berg, MD;  Location: [facility] OR;  Service: General;  Laterality: N/A;   REMOVAL OF TISSUE EXPANDER AND PLACEMENT OF IMPLANT Left 03/16/2023   Procedure: Removal of left breast tissue expander and placement of permanent implant;  Surgeon: Waddell Leonce NOVAK, MD;  Location: [facility];  Service: Plastics;  Laterality: Left;   VULVAR LESION REMOVAL  09/10/2012   Procedure: VULVAR LESION;  Surgeon: Carlin LELON Forbes, MD;  Location: [facility];  Service: Gynecology;  Laterality: N/A;  WIDE EXCISION OF VULVAR LESION   Social History:  Social History   Socioeconomic History   Marital status: Married    Spouse name: Allison Pierce   Number of children: 1   Years of education: 12   Highest education level: Not on file  Occupational History   Occupation: retired  Tobacco Use   Smoking status: Never    Passive exposure: Never   Smokeless tobacco: Never  Vaping Use   Vaping status: Never Used  Substance and Sexual Activity   Alcohol  use: Yes    Alcohol/week: 7.0 standard drinks of alcohol    Types: 7 Cans of beer per week   Drug use: Never   Sexual activity: Not Currently  Other Topics Concern   Not on file  Social History Narrative   Graduated HS      Right handed      Lives with husband   Social Drivers of Health   Tobacco Use: Low Risk (08/14/2024)   Patient History    Smoking Tobacco Use: Never    Smokeless Tobacco Use: Never    Passive Exposure: Never  Financial Resource Strain: Low Risk (02/22/2022)   Overall Financial Resource Strain (CARDIA)    Difficulty of Paying Living Expenses: Not hard at all  Food Insecurity: No Food Insecurity (11/25/2023)   Hunger Vital Sign    Worried About Running Out of Food in the Last Year: Never true    Ran Out of Food in the Last Year: Never true  Transportation Needs: No Transportation Needs (11/25/2023)   PRAPARE - Administrator, Civil Service (Medical): No    Lack of Transportation (Non-Medical): No  Physical Activity: Not on file  Stress: Not on file  Social Connections: Patient Declined (11/25/2023)   Social Connection and Isolation Panel    Frequency of Communication with Friends and Family: Patient declined    Frequency of Social Gatherings with Friends and Family: Patient declined    Attends Religious Services: Patient declined    Database Administrator or Organizations: Patient declined    Attends Banker Meetings: Patient declined    Marital Status: Patient declined  Intimate Partner Violence: Not At Risk (11/25/2023)   Humiliation, Afraid, Rape, and Kick questionnaire    Fear of Current or Ex-Partner: No    Emotionally Abused: No    Physically Abused: No    Sexually Abused: No  Depression (PHQ2-9): Low Risk (07/07/2024)   Depression (PHQ2-9)    PHQ-2 Score: 0  Alcohol Screen: Not on file  Housing: Unknown (11/25/2023)   Housing Stability Vital Sign    Unable to Pay for Housing in the Last Year: No    Number of Times Moved  in the Last Year: Not on file    Homeless in the Last Year: No  Utilities: Not At Risk (11/25/2023)  [employer] Utilities    Threatened with loss of utilities: No  Health Literacy: Not on file   Family History:  Family History  Problem Relation Age of Onset   Lung cancer Mother 40       she smoked   Prostate cancer Brother 67   Breast cancer Cousin        paternal first cousin   Breast cancer Cousin        paternal first cousin   Liver disease Neg Hx    Esophageal cancer Neg Hx    Colon cancer Neg Hx     Review of Systems: Constitutional: Doesn't report fevers, chills or abnormal weight loss Eyes: Doesn't report blurriness of vision Ears, nose, mouth, throat, and face: Doesn't report sore throat Respiratory: Doesn't report cough, dyspnea or wheezes Cardiovascular: Doesn't report palpitation, chest discomfort  Gastrointestinal:  Doesn't report nausea, constipation, diarrhea GU: Doesn't report incontinence Skin: Doesn't report skin rashes Neurological: Per HPI Musculoskeletal: Doesn't report joint pain Behavioral/Psych: Doesn't report anxiety  Physical Exam: Vitals:   10/16/24 1042  BP: (!) 173/80  Pulse: 82  Resp: 18  Temp: 98.1 F (36.7 C)  SpO2: 99%    KPS: 90. General: Alert, cooperative, pleasant, in no acute distress Head: Normal EENT: No conjunctival injection or scleral icterus.  Lungs: Resp effort normal Cardiac: Regular rate Abdomen: Non-distended abdomen Skin: No rashes cyanosis or petechiae. Extremities: No clubbing or edema  Neurologic Exam: Mental Status: Awake, alert, attentive to examiner. Oriented to self and environment. Language is fluent with intact comprehension.  Cranial Nerves: Visual acuity is grossly normal. Visual fields are full. Extra-ocular movements intact. No ptosis. Face is symmetric Motor: Tone and bulk are normal. Power is full in both arms and legs. Reflexes are symmetric, no pathologic reflexes present.  Sensory: Intact to light  touch Gait: Normal.   Labs: I have reviewed the data as listed    Component Value Date/Time   NA 135 10/03/2024 1632   K 4.1 10/03/2024 1632   CL 98 10/03/2024 1632   CO2 28 10/03/2024 1632   GLUCOSE 142 (H) 10/03/2024 1632   BUN 11 10/03/2024 1632   CREATININE 1.46 (H) 10/03/2024 1632   CREATININE 1.50 (H) 09/15/2024 1412   CALCIUM 8.9 10/03/2024 1632   PROT 6.9 09/15/2024 1412   ALBUMIN 4.4 09/15/2024 1412   AST 35 09/15/2024 1412   ALT 34 09/15/2024 1412   ALKPHOS 89 09/15/2024 1412   BILITOT 0.6 09/15/2024 1412   GFRNONAA 36 (L) 10/03/2024 1632   GFRNONAA 35 (L) 09/15/2024 1412   GFRAA  01/13/2009 1007    >60        The eGFR has been calculated using the MDRD equation. This calculation has not been validated in all clinical situations. eGFR's persistently <60 mL/min signify possible Chronic Kidney Disease.   Lab Results  Component Value Date   WBC 4.9 10/03/2024   NEUTROABS 3.0 10/03/2024   HGB 11.4 (L) 10/03/2024   HCT 34.8 (L) 10/03/2024   MCV 85.7 10/03/2024   PLT 269 10/03/2024    Imaging:  MR BRAIN W WO CONTRAST Result Date: 10/14/2024 EXAM: MRI BRAIN WITH AND WITHOUT CONTRAST 10/14/2024 12:20:06 PM TECHNIQUE: Multiplanar multisequence MRI of the head/brain was performed with and without the administration of intravenous contrast. CONTRAST: 6 mL of Gadavist . COMPARISON: MR Head without then with IV contrast 09/02/2024; 09/06/2023. CLINICAL HISTORY: Brain/CNS neoplasm, assess treatment response. The patient has a brain or central nervous system neoplasm and requires assessment of  treatment response. FINDINGS: BRAIN AND VENTRICLES: Overall similar mild parenchymal volume loss. Overall similar moderate-to-severe background subcortical and periventricular T2 and FLAIR signal hyperintensity likely reflecting sequelae of chronic microvascular ischemia. There is an overall similar 0.9 x 0.7 cm focus of enhancement involving the left superior frontal gyrus (series  14, image 146) with slightly increased associated T2/FLAIR hyperintensity. No significant regional mass effect or midline shift. There is again a punctate foci of susceptibility in the left external capsule region which may relate to the sequelae of a remote microhemorrhage. No acute intracranial hemorrhage. No acute infarct. No hydrocephalus. The sella is unremarkable. Normal flow voids. No new focus of abnormal intracranial enhancement. ORBITS: Bilateral lens replacement. SINUSES: No acute abnormality. BONES AND SOFT TISSUES: Normal bone marrow signal and enhancement. No acute soft tissue abnormality. CERVICAL SPINE: Partially evaluated disc osteophyte complex at C3-C4 effacing the ventral spinal canal and contacting the spinal cord. IMPRESSION: 1. Since 09/02/2024, overall similar 0.9 x 0.7 cm focus of enhancement in the left superior frontal gyrus suspicious for metastatic disease, with slightly increased associated signal abnormality that may represent a component of edema. No significant associated mass effect. 2. No new focus of abnormal intracranial enhancement. 3. Previously evaluated pituitary lesion seen to better advantage on recent sella MRI 09/02/2024. Electronically signed by: Prentice Spade MD 10/14/2024 03:45 PM EST RP Workstation: [id number]   DG Chest Port 1 View Result Date: 10/03/2024 EXAM: 1 VIEW(S) XRAY OF THE CHEST 10/03/2024 04:43:00 PM COMPARISON: Comparison with 05/13/2024. CLINICAL HISTORY: Persistent cough, shortness of breath, and immunotherapy. Additional history: Cough, body aches, fatigue, and weakness with concern for flu. Currently in treatment for breast cancer. FINDINGS: LINES, TUBES AND DEVICES: Power port type central venous catheter with tip over the cavoatrial junction region. LUNGS AND PLEURA: Lungs are clear. No pleural effusion or pneumothorax. HEART AND MEDIASTINUM: Heart size and pulmonary vascularity are normal. Mediastinal contours appear intact. Calcification of the  aorta. BONES AND SOFT TISSUES: Postoperative changes in the cervical spine. Surgical clips in the left axilla. Right breast implant. IMPRESSION: 1. No acute cardiopulmonary abnormality. 2. Right chest power port catheter tip projects at the cavoatrial junction. Electronically signed by: Elsie Gravely MD 10/03/2024 05:37 PM EST RP Workstation: [id number]    [facility] Clinician Interpretation: I have personally reviewed the radiological images as listed.  My interpretation, in the context of the patient's clinical presentation, is progressive disease    Assessment/Plan Metastasis to brain [facility])  Allison Pierce is clinically stable today.  MRI brain demonstrates modest growth novel enhancing lesion within anterior left frontal lobe.  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  Case will be referred to CNS tumor board, but most likely intervention will be radiosurgery, given size and location.  She is agreeable with this, referral will be placed for radiation oncology.  She will con't to follow with Dr. Gudena in the interim for [name] .   We appreciate the opportunity to participate in the care of Allison Pierce.   We ask that Allison Pierce return to clinic in 3 months following next brain MRI, or sooner as needed.  All questions were answered. The patient knows to call the clinic with any problems, questions or concerns. No barriers to learning were detected.  The total time spent in the encounter was 40 minutes and more than 50% was on counseling and review of test results   Arthea MARLA Manns, MD Medical Director of Neuro-Oncology [facility] at [facility]  10/16/24 10:53 AM "

## 2024-10-17 ENCOUNTER — Telehealth: Payer: Self-pay | Admitting: Internal Medicine

## 2024-10-17 NOTE — Telephone Encounter (Signed)
 Scheduled patient for next appointment. Called and spoke with the patient, she is aware.

## 2024-10-18 ENCOUNTER — Other Ambulatory Visit: Payer: Self-pay

## 2024-10-20 ENCOUNTER — Inpatient Hospital Stay

## 2024-10-20 VITALS — BP 143/68 | HR 76 | Temp 97.7°F | Resp 20 | Ht 66.0 in | Wt 154.8 lb

## 2024-10-20 DIAGNOSIS — Z5112 Encounter for antineoplastic immunotherapy: Secondary | ICD-10-CM | POA: Diagnosis not present

## 2024-10-20 DIAGNOSIS — C50412 Malignant neoplasm of upper-outer quadrant of left female breast: Secondary | ICD-10-CM

## 2024-10-20 MED ORDER — SODIUM CHLORIDE 0.9 % IV SOLN: Freq: Once | INTRAVENOUS | Status: AC

## 2024-10-20 MED ORDER — DIPHENHYDRAMINE HCL 25 MG PO CAPS
25.0000 mg | ORAL_CAPSULE | Freq: Once | ORAL | Status: AC
  Administered 2024-10-20: 25 mg via ORAL
  Filled 2024-10-20: qty 1

## 2024-10-20 MED ORDER — TRASTUZUMAB-DKST CHEMO 150 MG IV SOLR
6.0000 mg/kg | Freq: Once | INTRAVENOUS | Status: AC
  Administered 2024-10-20: 420 mg via INTRAVENOUS
  Filled 2024-10-20: qty 20

## 2024-10-20 MED ORDER — ACETAMINOPHEN 325 MG PO TABS
650.0000 mg | ORAL_TABLET | Freq: Once | ORAL | Status: AC
  Administered 2024-10-20: 650 mg via ORAL
  Filled 2024-10-20: qty 2

## 2024-10-20 MED ORDER — SODIUM CHLORIDE 0.9% FLUSH
10.0000 mL | INTRAVENOUS | Status: DC | PRN
  Administered 2024-10-20: 10 mL

## 2024-10-20 NOTE — Progress Notes (Signed)
 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 None Pacemaker/ICD? None Possible current pregnancy? None Is the patient on methotrexate? None  Additional Complaints / other details: None

## 2024-10-20 NOTE — Patient Instructions (Signed)
 [facility] - A DEPT OF [place]  Discharge Instructions: Thank you for choosing [facility] to provide your oncology and hematology care.   If you have a lab appointment with the Cancer Center, please go directly to the Cancer Center and check in at the registration area.   Wear comfortable clothing and clothing appropriate for easy access to any Portacath or PICC line.   We strive to give you quality time with your provider. You may need to reschedule your appointment if you arrive late (15 or more minutes).  Arriving late affects you and other patients whose appointments are after yours.  Also, if you miss three or more appointments without notifying the office, you may be dismissed from the clinic at the providers discretion.      For prescription refill requests, have your pharmacy contact our office and allow 72 hours for refills to be completed.    Today you received the following chemotherapy and/or immunotherapy agent: Trastuzumab - dkst (OGIVRI )     To help prevent nausea and vomiting after your treatment, we encourage you to take your nausea medication as directed.  BELOW ARE SYMPTOMS THAT SHOULD BE REPORTED IMMEDIATELY: *FEVER GREATER THAN 100.4 F (38 C) OR HIGHER *CHILLS OR SWEATING *NAUSEA AND VOMITING THAT IS NOT CONTROLLED WITH YOUR NAUSEA MEDICATION *UNUSUAL SHORTNESS OF BREATH *UNUSUAL BRUISING OR BLEEDING *URINARY PROBLEMS (pain or burning when urinating, or frequent urination) *BOWEL PROBLEMS (unusual diarrhea, constipation, pain near the anus) TENDERNESS IN MOUTH AND THROAT WITH OR WITHOUT PRESENCE OF ULCERS (sore throat, sores in mouth, or a toothache) UNUSUAL RASH, SWELLING OR PAIN  UNUSUAL VAGINAL DISCHARGE OR ITCHING   Items with * indicate a potential emergency and should be followed up as soon as possible or go to the Emergency Department if any problems should occur.  Please show the CHEMOTHERAPY ALERT CARD or  IMMUNOTHERAPY ALERT CARD at check-in to the Emergency Department and triage nurse.  Should you have questions after your visit or need to cancel or reschedule your appointment, please contact [facility] - A DEPT OF [facility]  Dept: [phone]  and follow the prompts.  Office hours are 8:00 a.m. to 4:30 p.m. Monday - Friday. Please note that voicemails left after 4:00 p.m. may not be returned until the following business day.  We are closed weekends and major holidays. You have access to a nurse at all times for urgent questions. Please call the main number to the clinic Dept: [phone] and follow the prompts.   For any non-urgent questions, you may also contact your provider using MyChart. We now offer e-Visits for anyone 44 and older to request care online for non-urgent symptoms. For details visit [email or web address].   Also download the MyChart app! Go to the app store, search MyChart, open the app, select [place], and log in with your MyChart username and password.

## 2024-10-21 ENCOUNTER — Telehealth: Payer: Self-pay | Admitting: Radiation Therapy

## 2024-10-21 NOTE — Telephone Encounter (Signed)
 I called Allison Pierce to introduce myself and discuss her upcoming radiation treatment planning appointments. She is in agreement with doing the simulation the same day as her consult with Dr. Izell, 1/23. She plans to apply EMLA  cream to her port for numbing prior to port access for Allison Pierce, and is going to see Dr. Darnella for consult on Monday 1/26 @ 1:00.   Treatment is planned for Tuesday 1/27. Allison Pierce was encouraged to eat, drink and take her medications as prescribed for all of these visits. There are no restrictions and we prefer she be as comfortable as possible.  She has my contact information for future questions or concerns.   Devere Perch R.T.(R)(T) Radiation Special Procedures Lead

## 2024-10-23 NOTE — Progress Notes (Addendum)
 " Radiation Oncology         [phone] ________________________________   Outpatient ReConsultation  Name: Allison Pierce MRN: [id number]  Date: 10/24/2024  DOB: [birth year]  RR:Dyjt, Joen, MD  Vaslow, Zachary K, MD   REFERRING PHYSICIAN: Vaslow, Zachary K, MD  DIAGNOSIS:     ICD-10-CM   1. Metastasis to brain [facility])  C79.31     2. 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 She was treated with a left mastectomy w/ left axillary SLN excisions in July of 2023; pathology from the procedure showed grade 2 invasive lobular carcinoma w/ LCIS; nodal status of 5/5 excised left axillary SLN excisions positive w/ extranodal extension present; all margins negative for invasive and in situ carcinoma. Prognostic indicators significant for: ER 0% negative; PR 0% negative; Her2 positive; Proliferation marker Ki67 at 20%. Of note: The patient was seen in the multidisciplinary breast clinic in May of 2023 to discuss radiation therapy, however radiation therapy was not pursued.   To complete her staging work-up, a CT CAP was performed on 04/26/22 that revealed evidence of widespread osseous metastatic disease to the right seventh rib, multiple thoracic vertebra, and to the sacrum.   She then  received adjuvant chemotherapy (palliative intent) with THP starting on 05/05/22 under the care of Dr. Odean. Her treatment was discontinued after 1 cycle due to severe toxicity that resulted in hospitalization. [name]  maintenance therapy was initiated on 05/26/22 and perjeta  was added in September of 2023. Perjeta  was however discontinued in September of 2024 secondary to colitis and she continued to receive maintenance herceptin  alone.   She also continued with routine surveillance imaging. Subsequent follow-up CT CAP's performed in August 2024, December 2024, and in May of 2025 all showed stability of the osseous mets and no evidence of soft tissue metastatic disease.   An MRI of the lumbar spine on 03/07/24 did demonstrate a new 2.5 cm L5 transverse process suspicious for metastatic disease vs degenerative changes. An L3 schmorl's node also showed an increase in size when compared to her CT in December of 2024. No other progressive findings were demonstrated. She did report having increasing back pain at this time which was likely related to the increasing L3 schmorl's node. Dr. Odean reviewed these findings with her and advised that she continue on maintenance therapy.   She also had progressive shoulder and back pain in August of 2025 that prompted an MRI of the right shoulder on 05/15/24 that showed an indeterminate intramedullary lesion in the proximal humeral diaphysis, possibly representing metastatic disease. No other evidence of of metastatic disease in the right shoulder region was demonstrated. A whole body bone scan was also performed on 06/04/24 that demonstrated no activity in the right humerus to correspond to the MRI findings. New indeterminate intense uptake  to the right eighth rib and mid lumbar spine was however present.   Her right shoulder pain persisted and Dr. Odean referred her to ortho for further management. (It was suspected that her shoulder pain was related to a prior  neck surgery).   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 Differential considerations noted at that time included a pituitary adenoma vs metastatic disease. Imaging otherwise showed no evidence of intracranial metastatic disease.   Dr. Odean advised proceeding with follow-up imaging and she accordingly presented for a repeat MRI of the brain on 09/06/23 that showed relative stability of the indeterminate pituitary gland mass. Imaging again showed no definite evidence of intracranial metastatic disease.   Her next follow-up MRI of the brain on 09/02/24 again showed stability of the pituitary lesion. However, a new 7 mm enhancing lesion in the left frontal lobe was demonstrated, concerning for metastatic disease.   She was seen by Dr Buckley on 09/04/24 who reviewed these findings with her. Dr. Buckley recommended proceeding with repeat imaging in 1 month to reassess the new lesion.   She accordingly presented for a repeat MRI of the brain on 10/14/24 which demonstrated: a slight increase in signal abnormality associated with the suspicious left frontal lobe lesion. The lesion otherwise appeared relatively similar in size, measuring 0.9 x 0.7 cm, previously measuring 0.7 cm, and without significant associated mass effect. No new areas of abnormal intracranial enhancement were demonstrate overall.   Dr. Buckley reviewed these findings with her on 10/16/24. Dr. Buckley suspects that her lesion is likely consistent with oligometastasis from a breast cancer primary, and he has recommended radiosurgery for management which we will discuss in detail together today. Her case will also be presented at the CNS tumor board for further review.  She will otherwise continue to follow with Dr. Gudena and receive maintenance  therapy.   Of note: She was seen in the ED on 07/11/24 after having a syncopal episode with nausea and vomiting after regaining consciousness. ED work-up was notable for hypertension but was otherwise unremarkable. She received IVF and losartan  with improvement in her symptoms (other than some ongoing dizziness). She later reported to ED staff that she often has to strain to urinate which she noted as a possible cause to her episode. Ultimately, her symptoms were likely attributed to vasovagal syncope and hypertension, and she was advised to follow-up with her PCP or cardiologist for further management.     PREVIOUS RADIATION THERAPY: No  PAST MEDICAL HISTORY:  has a past medical history of Anxiety, C. difficile diarrhea, Depression, GERD (gastroesophageal reflux disease) (OCCASIONALLY  TAKE TUMS), Human papilloma virus, Hyperlipidemia, Hypertension, left breast ca (12/2021), UC (ulcerative colitis) (HCC), UTI (urinary tract infection), and Vulvar lesion.    PAST SURGICAL HISTORY: Past Surgical History:  Procedure Laterality Date   ABDOMINAL HYSTERECTOMY  1992   W/ SALPINGO-OOPHORECTOMY   ANTERIOR CERVICAL DECOMP/DISCECTOMY FUSION  01-18-2009  DR POOLE   C4  - C6   AUGMENTATION MAMMAPLASTY Bilateral    BREAST BIOPSY Right    BREAST BIOPSY Left 02/10/2022   x2   BREAST ENHANCEMENT SURGERY  2011   BREAST EXCISIONAL BIOPSY Left    BREAST RECONSTRUCTION WITH PLACEMENT OF TISSUE EXPANDER AND FLEX HD (ACELLULAR HYDRATED DERMIS) Left 04/07/2022   Procedure: BREAST RECONSTRUCTION WITH PLACEMENT OF TISSUE EXPANDER  AND FLEX HD (ACELLULAR HYDRATED DERMIS);  Surgeon: Elisabeth Craig RAMAN, MD;  [facility];  Service: Plastics;  Laterality: Left;   BREAST SURGERY  02-04-2011  dr merrilyn   EXCISION LEFT BREAST MASS--  CALCIFICATION   EYE SURGERY Bilateral    cataract removal   IR CV LINE INJECTION  07/18/2024   IR IMAGING GUIDED PORT INSERTION  08/04/2024   MASTECTOMY W/ SENTINEL NODE BIOPSY Left 04/07/2022    Procedure: LEFT MASTECTOMY WITH SENTINEL NODE BIOPSY;  Surgeon: Vernetta Berg, MD;  Location: [facility] OR;  Service: General;  Laterality: Left;  LMA   PORTACATH PLACEMENT N/A 04/07/2022   Procedure: PORT-A-CATH INSERTION WITH ULTRASOUND GUIDANCE;  Surgeon: Vernetta Berg, MD;  Location: [facility] OR;  Service: General;  Laterality: N/A;   REMOVAL OF TISSUE EXPANDER AND PLACEMENT OF IMPLANT Left 03/16/2023   Procedure: Removal of left breast tissue expander and placement of permanent implant;  Surgeon: Waddell Leonce NOVAK, MD;  Location: [facility];  Service: Plastics;  Laterality: Left;   VULVAR LESION REMOVAL  09/10/2012   Procedure: VULVAR LESION;  Surgeon: Carlin LELON Forbes, MD;  Location: [facility];  Service: Gynecology;  Laterality: N/A;  WIDE EXCISION OF VULVAR LESION    FAMILY HISTORY: family history includes Breast cancer in her cousin and cousin; Lung cancer (age of onset: 58) in her mother; Prostate cancer (age of onset: 29) in her brother.  SOCIAL HISTORY:  reports that she has never smoked. She has never been exposed to tobacco smoke. She has never used smokeless tobacco. She reports current alcohol use of about 7.0 standard drinks of alcohol per week. She reports that she does not use drugs.  ALLERGIES: Oxybutynin, Prozac [fluoxetine hcl], Rosuvastatin, and Lyrica [pregabalin]  MEDICATIONS:  Current Outpatient Medications  Medication Sig Dispense Refill   buPROPion  (WELLBUTRIN  XL) 300 MG 24 hr tablet Take 300 mg by mouth in the morning.     clonazePAM  (KLONOPIN ) 1 MG tablet Take 1 mg by mouth at bedtime.     diphenhydrAMINE  (BENADRYL ) 25 MG tablet Take 25 mg by mouth in the morning and at bedtime.     losartan  (COZAAR ) 50 MG tablet Take 1 tablet (50 mg total) by mouth daily. 90 tablet 3   naproxen  (NAPROSYN ) 250 MG tablet Take 1.5 tablets (375 mg total) by mouth 2 (two) times daily. 14 tablet 0   oxyCODONE  (OXY IR/ROXICODONE ) 5 MG immediate release tablet  Take 0.5 tablets (2.5 mg total) by mouth every 6 (six) hours as needed for severe pain (pain score 7-10). (Patient not taking: Reported on 10/16/2024) 30 tablet 0   senna (SENOKOT) 8.6 MG TABS tablet Take 1 tablet (8.6 mg total) by mouth 3 (three) times daily. 120 tablet 3   tiZANidine  (ZANAFLEX ) 4 MG capsule TAKE 1 CAPSULE(4 MG) BY MOUTH AT BEDTIME 30 capsule 1   tiZANidine  (ZANAFLEX ) 4 MG capsule Take 1 capsule (4 mg total) by mouth 2 (two) times daily as needed for muscle spasms. 30 capsule 1   traZODone  (DESYREL ) 150 MG tablet Take 225 mg by mouth at bedtime.     venlafaxine  XR (EFFEXOR -XR) 75 MG 24 hr capsule Take 75 mg by mouth daily with breakfast.     No current facility-administered medications for this encounter.    REVIEW OF SYSTEMS:  As above.   PHYSICAL EXAM:  height is 5' 6 (1.676 m) and weight is 155 lb 3.2 oz (70.4 kg). Her temperature is 97.5 F (36.4 C) (abnormal). Her blood  pressure is 132/98 (abnormal) and her pulse is 66. Her respiration is 18 and oxygen saturation is 100%.   General: Alert and oriented, in no acute distress   HEENT: Head is normocephalic. Extraocular movements are intact. Oropharynx is clear. Neck: Neck is supple, no palpable cervical or supraclavicular lymphadenopathy. Heart: Regular in rate and rhythm with no murmurs, rubs, or gallops. Chest: Clear to auscultation bilaterally, with no rhonchi, wheezes, or rales. Abdomen: Soft, nontender, nondistended, with no rigidity or guarding. Extremities: No cyanosis or edema. Lymphatics: see Neck Exam Skin: No concerning lesions. Musculoskeletal: symmetric strength and muscle tone throughout. Neurologic: Cranial nerves II through XII are grossly intact. No obvious focalities. Speech is fluent. Coordination is intact. Psychiatric: affect is appropriate.  KPS = 90  100 - Normal; no complaints; no evidence of disease. 90   - Able to carry on normal activity; minor signs or symptoms of disease. 80   - Normal  activity with effort; some signs or symptoms of disease. 72   - Cares for self; unable to carry on normal activity or to do active work. 60   - Requires occasional assistance, but is able to care for most of his personal needs. 50   - Requires considerable assistance and frequent medical care. 40   - Disabled; requires special care and assistance. 30   - Severely disabled; hospital admission is indicated although death not imminent. 20   - Very sick; hospital admission necessary; active supportive treatment necessary. 10   - Moribund; fatal processes progressing rapidly. 0     - Dead  Karnofsky DA, Abelmann WH, Craver LS and [place] [phone]) The use of the nitrogen mustards in the palliative treatment of carcinoma: with particular reference to bronchogenic carcinoma Cancer 1 634-56  LABORATORY DATA:  Lab Results  Component Value Date   WBC 4.9 10/03/2024   HGB 11.4 (L) 10/03/2024   HCT 34.8 (L) 10/03/2024   MCV 85.7 10/03/2024   PLT 269 10/03/2024   CMP     Component Value Date/Time   NA 135 10/03/2024 1632   K 4.1 10/03/2024 1632   CL 98 10/03/2024 1632   CO2 28 10/03/2024 1632   GLUCOSE 142 (H) 10/03/2024 1632   BUN 11 10/03/2024 1632   CREATININE 1.46 (H) 10/03/2024 1632   CREATININE 1.50 (H) 09/15/2024 1412   CALCIUM 8.9 10/03/2024 1632   PROT 6.9 09/15/2024 1412   ALBUMIN 4.4 09/15/2024 1412   AST 35 09/15/2024 1412   ALT 34 09/15/2024 1412   ALKPHOS 89 09/15/2024 1412   BILITOT 0.6 09/15/2024 1412   GFR 52.76 (L) 06/25/2023 1020   GFRNONAA 36 (L) 10/03/2024 1632   GFRNONAA 35 (L) 09/15/2024 1412         RADIOGRAPHY: MR BRAIN W WO CONTRAST Result Date: 10/14/2024 EXAM: MRI BRAIN WITH AND WITHOUT CONTRAST 10/14/2024 12:20:06 PM TECHNIQUE: Multiplanar multisequence MRI of the head/brain was performed with and without the administration of intravenous contrast. CONTRAST: 6 mL of Gadavist . COMPARISON: MR Head without then with IV contrast 09/02/2024; 09/06/2023.  CLINICAL HISTORY: Brain/CNS neoplasm, assess treatment response. The patient has a brain or central nervous system neoplasm and requires assessment of treatment response. FINDINGS: BRAIN AND VENTRICLES: Overall similar mild parenchymal volume loss. Overall similar moderate-to-severe background subcortical and periventricular T2 and FLAIR signal hyperintensity likely reflecting sequelae of chronic microvascular ischemia. There is an overall similar 0.9 x 0.7 cm focus of enhancement involving the left superior frontal gyrus (series 14, image 146) with slightly increased  associated T2/FLAIR hyperintensity. No significant regional mass effect or midline shift. There is again a punctate foci of susceptibility in the left external capsule region which may relate to the sequelae of a remote microhemorrhage. No acute intracranial hemorrhage. No acute infarct. No hydrocephalus. The sella is unremarkable. Normal flow voids. No new focus of abnormal intracranial enhancement. ORBITS: Bilateral lens replacement. SINUSES: No acute abnormality. BONES AND SOFT TISSUES: Normal bone marrow signal and enhancement. No acute soft tissue abnormality. CERVICAL SPINE: Partially evaluated disc osteophyte complex at C3-C4 effacing the ventral spinal canal and contacting the spinal cord. IMPRESSION: 1. Since 09/02/2024, overall similar 0.9 x 0.7 cm focus of enhancement in the left superior frontal gyrus suspicious for metastatic disease, with slightly increased associated signal abnormality that may represent a component of edema. No significant associated mass effect. 2. No new focus of abnormal intracranial enhancement. 3. Previously evaluated pituitary lesion seen to better advantage on recent sella MRI 09/02/2024. Electronically signed by: Prentice Spade MD 10/14/2024 03:45 PM EST RP Workstation: [id number]   DG Chest Port 1 View Result Date: 10/03/2024 EXAM: 1 VIEW(S) XRAY OF THE CHEST 10/03/2024 04:43:00 PM COMPARISON: Comparison with  05/13/2024. CLINICAL HISTORY: Persistent cough, shortness of breath, and immunotherapy. Additional history: Cough, body aches, fatigue, and weakness with concern for flu. Currently in treatment for breast cancer. FINDINGS: LINES, TUBES AND DEVICES: Power port type central venous catheter with tip over the cavoatrial junction region. LUNGS AND PLEURA: Lungs are clear. No pleural effusion or pneumothorax. HEART AND MEDIASTINUM: Heart size and pulmonary vascularity are normal. Mediastinal contours appear intact. Calcification of the aorta. BONES AND SOFT TISSUES: Postoperative changes in the cervical spine. Surgical clips in the left axilla. Right breast implant. IMPRESSION: 1. No acute cardiopulmonary abnormality. 2. Right chest power port catheter tip projects at the cavoatrial junction. Electronically signed by: Elsie Gravely MD 10/03/2024 05:37 PM EST RP Workstation: [id number]      IMPRESSION/PLAN: This is a very pleasant 79 year old with metastatic disease to the brain.  Number of brain metastases on MRI is: 1  There are 0 metastases within 5mm of the hippocampi.  I had a lengthy discussion with the patient after reviewing their MRI results with them.  We spoke about whole brain radiotherapy versus stereotactic radiosurgery [facility]) to the brain. We spoke about the differing risks benefits and side effects of both of these treatments. During part of our discussion, we spoke about the hair loss, fatigue and cognitive effects that can result from whole brain radiotherapy.  Additionally, we spoke about radionecrosis that can result from stereotactic radiosurgery. I explained that whole brain radiotherapy is more comprehensive and therefore can decrease the chance of recurrences elsewhere in the brain, while stereotactic radiosurgery only treats the areas of gross disease while sparing the rest of the brain parenchyma.  I think the side effects of whole brain radiation are too significant to warrant this  treatment for a single brain metastasis; I recommend SRS  After lengthy discussion, the patient would like to proceed with stereotactic brain radiosurgery to their metastatic disease per my recommendation. A neurosurgeon will participate in their case.   We discussed the risks, benefits, and side effects of SRS. Side effects may include but not necessarily be limited to: hair loss, HA, nausea, dizziness, injury to brain, radionecrosis; No guarantees of treatment were given. A consent form was signed and placed in the patient's medical record. Her significant other is supportive of her decision. The patient was encouraged to ask questions  that I answered to the best of my ability.    CT simulation will take place on today and treatment (single fraction) next week..  On date of service, in total, I spent 60 minutes on this encounter. Patient was seen in person. Note signed after encounter date; minutes pertain to date of service, only.    __________________________________________   Lauraine Golden, MD  This document serves as a record of services personally performed by Lauraine Golden, MD. It was created on her behalf by Dorthy Fuse, a trained medical scribe. The creation of this record is based on the scribe's personal observations and the provider's statements to them. This document has been checked and approved by the attending provider.  "

## 2024-10-24 ENCOUNTER — Ambulatory Visit
Admission: RE | Admit: 2024-10-24 | Discharge: 2024-10-24 | Disposition: A | Discharge status: Home / Self Care | Source: Ambulatory Visit | Attending: Radiation Oncology | Admitting: Radiation Oncology

## 2024-10-24 ENCOUNTER — Ambulatory Visit
Admission: RE | Admit: 2024-10-24 | Discharge: 2024-10-24 | Disposition: A | Source: Ambulatory Visit | Attending: Radiation Oncology

## 2024-10-24 ENCOUNTER — Ambulatory Visit
Admission: RE | Admit: 2024-10-24 | Discharge: 2024-10-24 | Disposition: A | Source: Ambulatory Visit | Attending: Radiation Oncology | Admitting: Radiation Oncology

## 2024-10-24 VITALS — BP 132/98 | HR 66 | Temp 97.5°F | Resp 18 | Ht 66.0 in | Wt 155.2 lb

## 2024-10-24 DIAGNOSIS — C50412 Malignant neoplasm of upper-outer quadrant of left female breast: Secondary | ICD-10-CM | POA: Diagnosis not present

## 2024-10-24 DIAGNOSIS — M549 Dorsalgia, unspecified: Secondary | ICD-10-CM | POA: Diagnosis not present

## 2024-10-24 DIAGNOSIS — K5190 Ulcerative colitis, unspecified, without complications: Secondary | ICD-10-CM | POA: Insufficient documentation

## 2024-10-24 DIAGNOSIS — C50212 Malignant neoplasm of upper-inner quadrant of left female breast: Secondary | ICD-10-CM

## 2024-10-24 DIAGNOSIS — Z79899 Other long term (current) drug therapy: Secondary | ICD-10-CM | POA: Diagnosis not present

## 2024-10-24 DIAGNOSIS — G939 Disorder of brain, unspecified: Secondary | ICD-10-CM | POA: Insufficient documentation

## 2024-10-24 DIAGNOSIS — Z8042 Family history of malignant neoplasm of prostate: Secondary | ICD-10-CM | POA: Diagnosis not present

## 2024-10-24 DIAGNOSIS — Z801 Family history of malignant neoplasm of trachea, bronchus and lung: Secondary | ICD-10-CM | POA: Insufficient documentation

## 2024-10-24 DIAGNOSIS — E237 Disorder of pituitary gland, unspecified: Secondary | ICD-10-CM | POA: Diagnosis not present

## 2024-10-24 DIAGNOSIS — F419 Anxiety disorder, unspecified: Secondary | ICD-10-CM | POA: Insufficient documentation

## 2024-10-24 DIAGNOSIS — Z17420 Hormone receptor negative with human epidermal growth factor receptor 2 positive status: Secondary | ICD-10-CM | POA: Diagnosis not present

## 2024-10-24 DIAGNOSIS — C7931 Secondary malignant neoplasm of brain: Secondary | ICD-10-CM | POA: Insufficient documentation

## 2024-10-24 DIAGNOSIS — Z9012 Acquired absence of left breast and nipple: Secondary | ICD-10-CM | POA: Insufficient documentation

## 2024-10-24 DIAGNOSIS — Z171 Estrogen receptor negative status [ER-]: Secondary | ICD-10-CM | POA: Insufficient documentation

## 2024-10-24 DIAGNOSIS — R2689 Other abnormalities of gait and mobility: Secondary | ICD-10-CM | POA: Insufficient documentation

## 2024-10-24 DIAGNOSIS — E785 Hyperlipidemia, unspecified: Secondary | ICD-10-CM | POA: Diagnosis not present

## 2024-10-24 DIAGNOSIS — I10 Essential (primary) hypertension: Secondary | ICD-10-CM | POA: Insufficient documentation

## 2024-10-24 DIAGNOSIS — Z87440 Personal history of urinary (tract) infections: Secondary | ICD-10-CM | POA: Diagnosis not present

## 2024-10-24 DIAGNOSIS — Z791 Long term (current) use of non-steroidal anti-inflammatories (NSAID): Secondary | ICD-10-CM | POA: Diagnosis not present

## 2024-10-24 DIAGNOSIS — C7951 Secondary malignant neoplasm of bone: Secondary | ICD-10-CM | POA: Diagnosis not present

## 2024-10-24 NOTE — Progress Notes (Addendum)
 Has armband been applied?  Yes.    Does patient have an allergy to IV contrast dye?: No.   Has patient ever received premedication for IV contrast dye?: No.   Does patient take metformin?: No.  If patient does take metformin when was the last dose: N/A  Date of lab work: 10/03/24 BUN: 11 CR: 1.46 eGfr: 36  IV site: left side chest port   Has IV site been added to flowsheet?  Yes.    There were no vitals taken for this visit.

## 2024-10-25 ENCOUNTER — Encounter: Payer: Self-pay | Admitting: Radiation Oncology

## 2024-10-28 ENCOUNTER — Ambulatory Visit
Admission: RE | Admit: 2024-10-28 | Discharge: 2024-10-28 | Disposition: A | Source: Ambulatory Visit | Attending: Radiation Oncology | Admitting: Radiation Oncology

## 2024-10-28 ENCOUNTER — Other Ambulatory Visit: Payer: Self-pay | Admitting: Radiation Therapy

## 2024-10-28 ENCOUNTER — Other Ambulatory Visit: Payer: Self-pay

## 2024-10-28 LAB — RAD ONC ARIA SESSION SUMMARY
Course Elapsed Days: 0
Plan Fractions Treated to Date: 1
Plan Prescribed Dose Per Fraction: 20 Gy
Plan Total Fractions Prescribed: 1
Plan Total Prescribed Dose: 20 Gy
Reference Point Dosage Given to Date: 20 Gy
Reference Point Session Dosage Given: 20 Gy
Session Number: 1

## 2024-10-28 NOTE — Op Note (Signed)
" °  Name: Allison Pierce  MRN: [id number]  Date: 10/28/2024   DOB: 1946-05-05  Stereotactic Radiosurgery Operative Note  PRE-OPERATIVE DIAGNOSIS:  Solitary Brain Metastasis  POST-OPERATIVE DIAGNOSIS:  Solitary Brain Metastasis  PROCEDURE:  Stereotactic Radiosurgery  SURGEON:  Dorn JONELLE Glade, MD  NARRATIVE: The patient underwent a radiation treatment planning session in the radiation oncology simulation suite under the care of the radiation oncology physician and physicist.  I participated closely in the radiation treatment planning afterwards. The patient underwent planning CT which was fused to 3T high resolution MRI with 1 mm axial slices.  These images were fused on the planning system.  We contoured the gross target volumes and subsequently expanded this to yield the Planning Target Volume. I actively participated in the planning process.  I helped to define and review the target contours and also the contours of the optic pathway, eyes, brainstem and selected nearby organs at risk.  All the dose constraints for critical structures were reviewed and compared to AAPM Task Group 101.  The prescription dose conformity was reviewed.  I approved the plan electronically.    Accordingly, Allison Pierce was brought to the TrueBeam stereotactic radiation treatment linac and placed in the custom immobilization mask.  The patient was aligned according to the IR fiducial markers with BrainLab Exactrac, then orthogonal x-rays were used in ExacTrac with the 6DOF robotic table and the shifts were made to align the patient  Allison Pierce received stereotactic radiosurgery uneventfully.    The detailed description of the procedure is recorded in the radiation oncology procedure note.  I was present for the duration of the procedure.  DISPOSITION:  Following delivery, the patient was transported to nursing in stable condition and monitored for possible acute effects to be discharged  to home in stable condition with follow-up in one month.  Dorn JONELLE Glade, MD 10/28/2024 3:30 PM  "

## 2024-10-28 NOTE — Progress Notes (Addendum)
 Nurse monitoring complete status post of SRS treatments. Patient without complaints. Patient denies new or worsening neurologic symptoms. Vitals stable. Instructed patient to avoid strenuous activity for the next 24 hours. (Instructed patient to not miss any of her decadron  doses). Instructed patient to call [phone] with needs related to treatment after hours or over the weekend. Patient verbalized understanding   Allison Pierce. rested with us  for 15 minutes following SRS treatment.  Patient denies headache, nausea, diplopia or ringing in the ears. Denies fatigue. Patient without complaints. Understands to avoid strenuous activity for the next 24 hours and call [phone] with needs.  Patient complains of dizziness.

## 2024-10-29 ENCOUNTER — Ambulatory Visit: Admitting: Radiation Oncology

## 2024-10-29 NOTE — Radiation Completion Notes (Signed)
 Patient Name: Allison Pierce, Allison Pierce MRN: [id number] Date of Birth: 1946/05/27 Referring Physician: JOEN GENTRY, M.D. Date of Service: 2024-10-29 Radiation Oncologist: Lauraine Golden, M.D. [facility]                             RADIATION ONCOLOGY END OF TREATMENT NOTE     Diagnosis: C79.31 Secondary malignant neoplasm of brain Staging on 2022-02-22: Malignant neoplasm of upper-outer quadrant of left breast in female, estrogen receptor negative (HCC) T=cT1c, N=cN0, M=cM0 Intent: Palliative     ==========DELIVERED PLANS==========  First Treatment Date: 2024-10-28 Last Treatment Date: 2024-10-28   Plan Name: Brain_SRS Site: Brain Technique: SBRT/SRT-3D Mode: Photon Dose Per Fraction: 20 Gy Prescribed Dose (Delivered / Prescribed): 20 Gy / 20 Gy Prescribed Fxs (Delivered / Prescribed): 1 / 1     ==========ON TREATMENT VISIT DATES========== 2024-10-28     ==========UPCOMING VISITS========== 12/29/2024 [facility] INFUSION 1HR30MIN (90) [facility] INFUSION  11/24/2024 [facility] INFUSION 1HR30MIN (90) [facility] INFUSION  11/24/2024 [facility] PORT FLUSH W/LAB [facility] FLUSH        ==========APPENDIX - ON TREATMENT VISIT NOTES==========   See weekly On Treatment Notes in Epic for details in the Media tab (listed as Progress notes on the On Treatment Visit Dates listed above).

## 2024-11-06 ENCOUNTER — Telehealth: Payer: Self-pay | Admitting: *Deleted

## 2024-11-06 ENCOUNTER — Telehealth: Payer: Self-pay | Admitting: Radiation Therapy

## 2024-11-06 MED ORDER — AMOXICILLIN 500 MG PO CAPS: 2000.0000 mg | ORAL_CAPSULE | Freq: Once | ORAL | 0 refills | Status: AC

## 2024-11-06 NOTE — Telephone Encounter (Signed)
"    I called Allison Pierce to check in on her after the completion of her brain radiation. She said that she has been feeling bad, but not from her brain treatment. 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 I answered her questions. She also has a couple teeth she needs pulled and wanted to speak with Dr. Gara nurse about whether or not this was ok to schedule during her current systemic treatment course. I gave her the number to reach out to Dr. Gara team. She also had questions about when the bone scan will be completed. The order has been entered but not yet scheduled. I gave her the number for central scheduling in case she wants to go ahead and get this set up as well. The expected date for this scan is 3/15.  We discussed her scheduled brain follow-up scan and visit with Dr. Buckley. She has everything written down and also has my contact information for future questions. At the end of the call, all if her questions had been answered and she was encouraged to call back if she needed anything else.   Devere Perch R.T(R)(T) Radiation Special Procedures Lead  "

## 2024-11-06 NOTE — Telephone Encounter (Signed)
 Received call from pt stating she is needing to schedule tooth extraction for two decayed teeth.  Pt requesting advice from MD if okay to proceed with dental procedure.  Per MD, okay to proceed but pt will need to take Amoxicillin  2g p.o the morning of the procedure.  Prescription sent to pharmacy on file, pt educated and verbalized understanding.

## 2024-11-24 ENCOUNTER — Inpatient Hospital Stay: Attending: Hematology and Oncology

## 2024-11-24 ENCOUNTER — Inpatient Hospital Stay

## 2024-12-09 ENCOUNTER — Other Ambulatory Visit (HOSPITAL_COMMUNITY)

## 2024-12-29 ENCOUNTER — Inpatient Hospital Stay: Attending: Hematology and Oncology

## 2025-01-28 ENCOUNTER — Other Ambulatory Visit

## 2025-02-02 ENCOUNTER — Inpatient Hospital Stay: Admitting: Hematology and Oncology

## 2025-02-02 ENCOUNTER — Inpatient Hospital Stay: Admitting: Internal Medicine

## 2025-02-02 ENCOUNTER — Inpatient Hospital Stay

## 2025-02-02 ENCOUNTER — Inpatient Hospital Stay: Attending: Hematology and Oncology

## 2025-02-09 ENCOUNTER — Inpatient Hospital Stay: Admitting: Internal Medicine
